# Patient Record
Sex: Female | Born: 1955
Health system: Southern US, Community
[De-identification: ages and names within clinical notes are randomized; demographics above are authoritative.]

## PROBLEM LIST (undated history)

## (undated) DIAGNOSIS — K219 Gastro-esophageal reflux disease without esophagitis: Secondary | ICD-10-CM

## (undated) DIAGNOSIS — G8929 Other chronic pain: Secondary | ICD-10-CM

## (undated) DIAGNOSIS — F329 Major depressive disorder, single episode, unspecified: Secondary | ICD-10-CM

## (undated) DIAGNOSIS — R519 Headache, unspecified: Secondary | ICD-10-CM

## (undated) DIAGNOSIS — R002 Palpitations: Secondary | ICD-10-CM

## (undated) DIAGNOSIS — E785 Hyperlipidemia, unspecified: Secondary | ICD-10-CM

## (undated) DIAGNOSIS — M1990 Unspecified osteoarthritis, unspecified site: Secondary | ICD-10-CM

## (undated) DIAGNOSIS — E0590 Thyrotoxicosis, unspecified without thyrotoxic crisis or storm: Secondary | ICD-10-CM

## (undated) DIAGNOSIS — I10 Essential (primary) hypertension: Secondary | ICD-10-CM

## (undated) DIAGNOSIS — F419 Anxiety disorder, unspecified: Secondary | ICD-10-CM

## (undated) DIAGNOSIS — M549 Dorsalgia, unspecified: Secondary | ICD-10-CM

## (undated) DIAGNOSIS — F32A Depression, unspecified: Secondary | ICD-10-CM

## (undated) DIAGNOSIS — G4701 Insomnia due to medical condition: Secondary | ICD-10-CM

## (undated) DIAGNOSIS — R51 Headache: Secondary | ICD-10-CM

## (undated) DIAGNOSIS — H269 Unspecified cataract: Secondary | ICD-10-CM

## (undated) DIAGNOSIS — K5900 Constipation, unspecified: Secondary | ICD-10-CM

## (undated) DIAGNOSIS — A150 Tuberculosis of lung: Secondary | ICD-10-CM

## (undated) HISTORY — DX: Tuberculosis of lung: A15.0

## (undated) HISTORY — PX: TRANSTHORACIC ECHOCARDIOGRAM: SHX275

## (undated) HISTORY — PX: VAGINAL HYSTERECTOMY: SUR661

## (undated) HISTORY — DX: Depression, unspecified: F32.A

## (undated) HISTORY — PX: CHOLECYSTECTOMY, LAPAROSCOPIC: SHX56

## (undated) HISTORY — DX: Constipation, unspecified: K59.00

## (undated) HISTORY — DX: Major depressive disorder, single episode, unspecified: F32.9

## (undated) HISTORY — PX: BLADDER REPAIR: SHX76

## (undated) HISTORY — DX: Palpitations: R00.2

---

## 1999-06-29 ENCOUNTER — Encounter: Payer: Self-pay | Admitting: Emergency Medicine

## 1999-06-29 ENCOUNTER — Encounter: Admission: RE | Admit: 1999-06-29 | Discharge: 1999-06-29 | Payer: Self-pay | Admitting: Emergency Medicine

## 2000-01-20 ENCOUNTER — Encounter: Payer: Self-pay | Admitting: Emergency Medicine

## 2000-01-20 ENCOUNTER — Inpatient Hospital Stay (HOSPITAL_COMMUNITY): Admission: EM | Admit: 2000-01-20 | Discharge: 2000-01-22 | Payer: Self-pay

## 2004-05-25 ENCOUNTER — Ambulatory Visit: Payer: Self-pay | Admitting: Internal Medicine

## 2004-05-29 ENCOUNTER — Ambulatory Visit: Payer: Self-pay | Admitting: Family Medicine

## 2004-06-06 ENCOUNTER — Ambulatory Visit: Payer: Self-pay | Admitting: Family Medicine

## 2004-06-06 ENCOUNTER — Ambulatory Visit: Payer: Self-pay | Admitting: *Deleted

## 2004-08-17 ENCOUNTER — Ambulatory Visit: Payer: Self-pay | Admitting: Family Medicine

## 2004-10-24 ENCOUNTER — Ambulatory Visit: Payer: Self-pay | Admitting: Nurse Practitioner

## 2004-11-17 ENCOUNTER — Ambulatory Visit: Payer: Self-pay | Admitting: Family Medicine

## 2005-02-23 ENCOUNTER — Ambulatory Visit: Payer: Self-pay | Admitting: Family Medicine

## 2005-12-18 ENCOUNTER — Ambulatory Visit: Payer: Self-pay | Admitting: Family Medicine

## 2005-12-28 ENCOUNTER — Ambulatory Visit: Payer: Self-pay | Admitting: Family Medicine

## 2006-03-21 ENCOUNTER — Ambulatory Visit: Payer: Self-pay | Admitting: Internal Medicine

## 2006-03-27 ENCOUNTER — Ambulatory Visit: Payer: Self-pay | Admitting: Family Medicine

## 2006-03-30 ENCOUNTER — Emergency Department (HOSPITAL_COMMUNITY): Admission: EM | Admit: 2006-03-30 | Discharge: 2006-03-30 | Payer: Self-pay | Admitting: Emergency Medicine

## 2006-04-09 ENCOUNTER — Ambulatory Visit: Payer: Self-pay | Admitting: Family Medicine

## 2006-04-10 ENCOUNTER — Ambulatory Visit (HOSPITAL_COMMUNITY): Admission: RE | Admit: 2006-04-10 | Discharge: 2006-04-10 | Payer: Self-pay | Admitting: Family Medicine

## 2006-04-18 ENCOUNTER — Ambulatory Visit: Payer: Self-pay | Admitting: Family Medicine

## 2006-06-17 ENCOUNTER — Ambulatory Visit: Payer: Self-pay | Admitting: Family Medicine

## 2006-11-05 ENCOUNTER — Ambulatory Visit: Payer: Self-pay | Admitting: Family Medicine

## 2006-11-06 ENCOUNTER — Encounter (INDEPENDENT_AMBULATORY_CARE_PROVIDER_SITE_OTHER): Payer: Self-pay | Admitting: Family Medicine

## 2006-11-06 ENCOUNTER — Encounter (INDEPENDENT_AMBULATORY_CARE_PROVIDER_SITE_OTHER): Payer: Self-pay | Admitting: *Deleted

## 2006-11-20 ENCOUNTER — Ambulatory Visit: Payer: Self-pay | Admitting: Family Medicine

## 2007-01-06 ENCOUNTER — Ambulatory Visit: Payer: Self-pay | Admitting: Family Medicine

## 2007-01-13 ENCOUNTER — Ambulatory Visit (HOSPITAL_COMMUNITY): Admission: RE | Admit: 2007-01-13 | Discharge: 2007-01-13 | Payer: Self-pay | Admitting: Family Medicine

## 2007-04-18 ENCOUNTER — Ambulatory Visit: Payer: Self-pay | Admitting: Family Medicine

## 2007-05-12 ENCOUNTER — Ambulatory Visit: Payer: Self-pay | Admitting: Family Medicine

## 2008-04-09 ENCOUNTER — Emergency Department (HOSPITAL_COMMUNITY): Admission: EM | Admit: 2008-04-09 | Discharge: 2008-04-09 | Payer: Self-pay | Admitting: Emergency Medicine

## 2008-04-26 ENCOUNTER — Ambulatory Visit: Payer: Self-pay | Admitting: Family Medicine

## 2008-06-15 ENCOUNTER — Encounter (INDEPENDENT_AMBULATORY_CARE_PROVIDER_SITE_OTHER): Payer: Self-pay | Admitting: *Deleted

## 2008-06-15 ENCOUNTER — Ambulatory Visit: Payer: Self-pay | Admitting: Sports Medicine

## 2008-06-15 DIAGNOSIS — IMO0002 Reserved for concepts with insufficient information to code with codable children: Secondary | ICD-10-CM

## 2008-06-22 ENCOUNTER — Encounter: Payer: Self-pay | Admitting: Sports Medicine

## 2008-06-29 ENCOUNTER — Ambulatory Visit: Payer: Self-pay | Admitting: Sports Medicine

## 2008-07-06 ENCOUNTER — Telehealth (INDEPENDENT_AMBULATORY_CARE_PROVIDER_SITE_OTHER): Payer: Self-pay | Admitting: *Deleted

## 2008-08-03 ENCOUNTER — Ambulatory Visit: Payer: Self-pay | Admitting: Sports Medicine

## 2008-08-04 ENCOUNTER — Encounter: Payer: Self-pay | Admitting: Sports Medicine

## 2008-08-04 ENCOUNTER — Telehealth (INDEPENDENT_AMBULATORY_CARE_PROVIDER_SITE_OTHER): Payer: Self-pay | Admitting: *Deleted

## 2008-08-26 ENCOUNTER — Telehealth (INDEPENDENT_AMBULATORY_CARE_PROVIDER_SITE_OTHER): Payer: Self-pay | Admitting: *Deleted

## 2008-09-01 ENCOUNTER — Ambulatory Visit: Payer: Self-pay | Admitting: Sports Medicine

## 2008-09-29 ENCOUNTER — Ambulatory Visit: Payer: Self-pay | Admitting: Sports Medicine

## 2008-10-04 ENCOUNTER — Telehealth (INDEPENDENT_AMBULATORY_CARE_PROVIDER_SITE_OTHER): Payer: Self-pay | Admitting: *Deleted

## 2008-10-05 ENCOUNTER — Ambulatory Visit (HOSPITAL_COMMUNITY): Admission: RE | Admit: 2008-10-05 | Discharge: 2008-10-05 | Payer: Self-pay | Admitting: Sports Medicine

## 2008-10-12 ENCOUNTER — Encounter: Payer: Self-pay | Admitting: Sports Medicine

## 2008-10-19 ENCOUNTER — Encounter: Payer: Self-pay | Admitting: Sports Medicine

## 2008-10-28 ENCOUNTER — Encounter: Payer: Self-pay | Admitting: Sports Medicine

## 2008-11-08 ENCOUNTER — Encounter: Payer: Self-pay | Admitting: Sports Medicine

## 2008-12-06 ENCOUNTER — Encounter: Payer: Self-pay | Admitting: Sports Medicine

## 2009-03-22 ENCOUNTER — Ambulatory Visit: Payer: Self-pay | Admitting: Sports Medicine

## 2009-03-22 DIAGNOSIS — M545 Low back pain: Secondary | ICD-10-CM

## 2009-03-23 ENCOUNTER — Encounter: Payer: Self-pay | Admitting: Sports Medicine

## 2009-03-29 ENCOUNTER — Encounter: Payer: Self-pay | Admitting: Sports Medicine

## 2009-04-18 ENCOUNTER — Encounter: Admission: RE | Admit: 2009-04-18 | Discharge: 2009-06-01 | Payer: Self-pay | Admitting: Neurosurgery

## 2010-03-12 ENCOUNTER — Encounter: Payer: Self-pay | Admitting: Nurse Practitioner

## 2010-03-12 ENCOUNTER — Encounter: Payer: Self-pay | Admitting: Family Medicine

## 2010-03-21 NOTE — Letter (Signed)
Summary: External Other  External Other   Imported By: Marily Memos 03/22/2009 14:23:45  _____________________________________________________________________  External Attachment:    Type:   Image     Comment:   External Document

## 2010-03-21 NOTE — Progress Notes (Signed)
Summary: Clinical Lists Update  Clinical Lists Update   Imported By: Marily Memos 03/23/2009 14:13:20  _____________________________________________________________________  External Attachment:    Type:   Image     Comment:   External Document

## 2010-03-21 NOTE — Consult Note (Signed)
Summary: [facility]  [facility]   Imported By: Marily Memos 04/07/2009 15:08:56  _____________________________________________________________________  External Attachment:    Type:   Image     Comment:   External Document

## 2010-03-21 NOTE — Letter (Signed)
Summary: *Referral Letter  [facility]  [address]   [place], [place] [address]   Phone: [phone]  Fax: [phone]    03/22/2009  [facility] [address], [address] [place], [place]  [address] Fax: [phone]   Dear Neurosurgical Consultant:  Thank you in advance for agreeing to see my patient:  Karla Foster [address] [place], [place]  [address]  Phone: [phone]  Reason for Referral: This is a patient with MRI documented Disk herniation that I think is causing radiculopathy and weakness.  She has failed more conservative treatment.  Your opinion as to whether surgical treatment is advisable.  Procedures Requested: Lumbar disk decompression if indicated  Current Medical Problems: 1)  BACK PAIN, LUMBAR, CHRONIC (ICD-724.2) 2)  LUMBAR RADICULOPATHY, LEFT (ICD-724.4)   Current Medications: 1)  GABAPENTIN 600 MG TABS (GABAPENTIN) 1 by mouth three times a day PRN 2)  OXYCODONE-ACETAMINOPHEN 10-325 MG TABS (OXYCODONE-ACETAMINOPHEN) 1 by mouth up to 4 times per day as needed for pain   Thank you again for agreeing to see our patient; please contact us if you have any further questions or need additional information.  Sincerely,  Vincent Gros MD

## 2010-03-21 NOTE — Assessment & Plan Note (Signed)
Summary: F/U [facility]   Vital Signs:  Patient profile:   55 year old female BP sitting:   142 / 82  Vitals Entered By: Lillia Pauls CMA (March 22, 2009 10:37 AM)  History of Present Illness: Patient returns in followup of radicular low back pain with weakness and radiation into left leg primarily sometimes into RT leg She is able to fucntion on pain meds and gabapentin with limited activity However, has not really gotten better Has had a couple of falls now uses walker for safety left leg feels like it wants to give way  MRI showed shallow Disk at L5/S1 with possible compression of left S1 nerve root in 09/2008  She has not returned 2/2 $$ Recently goit her approval for boith medicare and medicaid and wants to try to move ahead with seeing if more aggressive care would help  Physical Exam  General:  Well-developed,well-nourished,in no acute distress; alert,appropriate and cooperative throughout examination Msk:  + slr on left + pain in left buttocks on SLR on RT  Weakness in great toe dorsi and plantar flexion on left slight weakness in dorsiflex on RT  Weakness on eversion but not inversion of foot left normal on RT  walks with limp and less pushoff on LT unable to do toe walk  Reflexes Patellar RT is 3+/ Lt is 2+ Ankle RT 2+ and left is 1+   Impression & Recommendations:  Problem # 1:  LUMBAR RADICULOPATHY, LEFT (ICD-724.4)  Her updated medication list for this problem includes:    Gabapentin 600 three times a day Use walker if gait is improved  will refer for neurosurg opinion  Problem # 2:  BACK PAIN, LUMBAR, CHRONIC (ICD-724.2)  Her updated medication list for this problem includes:    Oxycodone-acetaminophen 10-325 Mg Tabs (Oxycodone-acetaminophen) .Marland Kitchen... 1 by mouth up to 4 times per day as needed for pain  Complete Medication List: 1)  Gabapentin 600 Mg Tabs (Gabapentin) .Marland Kitchen.. 1 by mouth three times a day prn 2)  Oxycodone-acetaminophen 10-325 Mg  Tabs (Oxycodone-acetaminophen) .Marland Kitchen.. 1 by mouth up to 4 times per day as needed for pain

## 2011-06-05 ENCOUNTER — Emergency Department (HOSPITAL_COMMUNITY): Payer: Medicare Other

## 2011-06-05 ENCOUNTER — Encounter (HOSPITAL_COMMUNITY): Payer: Self-pay

## 2011-06-05 ENCOUNTER — Observation Stay (HOSPITAL_COMMUNITY)
Admission: EM | Admit: 2011-06-05 | Discharge: 2011-06-06 | Disposition: A | Discharge status: Home / Self Care | Payer: Medicare Other | Attending: Emergency Medicine | Admitting: Emergency Medicine

## 2011-06-05 DIAGNOSIS — E785 Hyperlipidemia, unspecified: Secondary | ICD-10-CM | POA: Diagnosis present

## 2011-06-05 DIAGNOSIS — M545 Low back pain, unspecified: Secondary | ICD-10-CM | POA: Diagnosis present

## 2011-06-05 DIAGNOSIS — F17200 Nicotine dependence, unspecified, uncomplicated: Secondary | ICD-10-CM | POA: Diagnosis present

## 2011-06-05 DIAGNOSIS — F419 Anxiety disorder, unspecified: Secondary | ICD-10-CM | POA: Diagnosis present

## 2011-06-05 DIAGNOSIS — R9431 Abnormal electrocardiogram [ECG] [EKG]: Secondary | ICD-10-CM | POA: Diagnosis present

## 2011-06-05 DIAGNOSIS — R079 Chest pain, unspecified: Principal | ICD-10-CM | POA: Insufficient documentation

## 2011-06-05 DIAGNOSIS — I200 Unstable angina: Secondary | ICD-10-CM | POA: Diagnosis present

## 2011-06-05 DIAGNOSIS — R002 Palpitations: Secondary | ICD-10-CM | POA: Diagnosis not present

## 2011-06-05 DIAGNOSIS — M549 Dorsalgia, unspecified: Secondary | ICD-10-CM | POA: Insufficient documentation

## 2011-06-05 DIAGNOSIS — G8929 Other chronic pain: Secondary | ICD-10-CM | POA: Insufficient documentation

## 2011-06-05 DIAGNOSIS — R55 Syncope and collapse: Secondary | ICD-10-CM | POA: Insufficient documentation

## 2011-06-05 DIAGNOSIS — G43909 Migraine, unspecified, not intractable, without status migrainosus: Secondary | ICD-10-CM | POA: Diagnosis present

## 2011-06-05 DIAGNOSIS — G4700 Insomnia, unspecified: Secondary | ICD-10-CM | POA: Insufficient documentation

## 2011-06-05 DIAGNOSIS — F411 Generalized anxiety disorder: Secondary | ICD-10-CM | POA: Insufficient documentation

## 2011-06-05 DIAGNOSIS — Z8249 Family history of ischemic heart disease and other diseases of the circulatory system: Secondary | ICD-10-CM

## 2011-06-05 HISTORY — DX: Thyrotoxicosis, unspecified without thyrotoxic crisis or storm: E05.90

## 2011-06-05 HISTORY — DX: Other chronic pain: G89.29

## 2011-06-05 HISTORY — DX: Hyperlipidemia, unspecified: E78.5

## 2011-06-05 HISTORY — DX: Anxiety disorder, unspecified: F41.9

## 2011-06-05 HISTORY — DX: Dorsalgia, unspecified: M54.9

## 2011-06-05 HISTORY — DX: Insomnia due to medical condition: G47.01

## 2011-06-05 LAB — BASIC METABOLIC PANEL
CO2: 25 mEq/L (ref 19–32)
Calcium: 9.1 mg/dL (ref 8.4–10.5)
Chloride: 106 mEq/L (ref 96–112)
Glucose, Bld: 88 mg/dL (ref 70–99)
Sodium: 141 mEq/L (ref 135–145)

## 2011-06-05 LAB — POCT I-STAT TROPONIN I: Troponin i, poc: 0.01 ng/mL (ref 0.00–0.08)

## 2011-06-05 LAB — CBC
HCT: 39.6 % (ref 36.0–46.0)
MCV: 89.2 fL (ref 78.0–100.0)
Platelets: 314 10*3/uL (ref 150–400)
WBC: 13.1 10*3/uL — ABNORMAL HIGH (ref 4.0–10.5)

## 2011-06-05 LAB — D-DIMER, QUANTITATIVE: D-Dimer, Quant: <0.22 ug/mL-FEU (ref 0.00–0.48)

## 2011-06-05 MED ORDER — ONDANSETRON HCL 4 MG/2ML IJ SOLN
4.0000 mg | Freq: Once | INTRAMUSCULAR | Status: AC
  Administered 2011-06-05: 4 mg via INTRAVENOUS
  Filled 2011-06-05: qty 2

## 2011-06-05 MED ORDER — MORPHINE SULFATE 4 MG/ML IJ SOLN
4.0000 mg | Freq: Once | INTRAMUSCULAR | Status: AC
  Administered 2011-06-05: 4 mg via INTRAVENOUS
  Filled 2011-06-05: qty 1

## 2011-06-05 MED ORDER — ASPIRIN 81 MG PO CHEW: 324.0000 mg | CHEWABLE_TABLET | Freq: Once | ORAL | Status: DC

## 2011-06-05 MED ORDER — ONDANSETRON HCL 4 MG/2ML IJ SOLN
INTRAMUSCULAR | Status: AC
  Filled 2011-06-05: qty 2

## 2011-06-05 MED ORDER — SODIUM CHLORIDE 0.9 % IV SOLN
Freq: Once | INTRAVENOUS | Status: AC
  Administered 2011-06-05: 18:00:00 via INTRAVENOUS

## 2011-06-05 MED ORDER — METOPROLOL TARTRATE 25 MG PO TABS
100.0000 mg | ORAL_TABLET | Freq: Once | ORAL | Status: DC
  Filled 2011-06-05: qty 4

## 2011-06-05 NOTE — ED Notes (Signed)
Per EMS- patient reporting palpitations x several weeks; today, patient reporting central chest pain 8/10 without radiation with nausea. 1 NTG, 4mg  of zofran, 324 ASA given all PTA.  Patient was picked up at her PCP's office and had a syncopal episode while at the office.

## 2011-06-05 NOTE — ED Provider Notes (Signed)
8:00 PM Care of the patient assumed in the CDU under the chest pain protocol. Patient presented to the ED with palpitations for the past several weeks. She had an episode today of central chest pain which did not radiate and was not accompanied by nausea, vomiting, diaphoresis, shortness of breath. She did have a syncopal episode while at her PCPs office today. Patient's pain is reproducible with palpation, and her vital signs are stable. Her lab workup has essentially been negative so far. Her EKG was unremarkable. Initial troponin was 0.01. Patient to the CDU awaiting CCTA in the morning. On exam, patient noted to be relatively anxious. Her pain is very reproducible to palpation. Regular rate and rhythm, lungs clear to auscultation bilaterally, abdomen soft, nontender. Discussed plan with patient. She verbalized understanding and was agreeable.  12:17 AM Patient reassessed. She remains comfortable and pain-free. Exam unchanged from previous. Pt signed out to Dr. Dierdre Highman, blue side overnight MD.  Grant Fontana, PA 06/06/11 [phone]

## 2011-06-05 NOTE — ED Provider Notes (Signed)
History     CSN: [id number]  Arrival date & time 06/05/11  1623   First MD Initiated Contact with Patient 06/05/11 1634      Chief Complaint  Patient presents with  . Chest Pain    (Consider location/radiation/quality/duration/timing/severity/associated sxs/prior treatment) HPI Pt presents with c/o palpitations and chest pain.  Pt states she has been having intermittent racing heart rate and palpitations over the past several weeks.  Today she reports onset of 8/10 midsternal chest pain with associated nausea.  She denies diaphoresis, or radiation of pain.  No leg swelling, no hx DVT/PE, no recent travel.  She does have a hx of anxiety and states that she has been under a lot of stress and seeing a psychologist.  She states she feels fatigued.  No fever, cough or other associated systemic symptoms.  There are no alleviating or modifying factors, pain not related to exertion.   Past Medical History  Diagnosis Date  . Anxiety   . 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 Chronic back pain   . Dyslipidemia     not Rx'd  . Hyperthyroidism     treated in past    Past Surgical History  Procedure Date  . Cholecystectomy, laparoscopic   . Vaginal hysterectomy   . Bladder repair     after hysterectomy    Family History  Problem Relation Age of Onset  . Coronary artery disease Father     CABG 71's, died 80 y/o  . Coronary artery disease Brother     died 49y/o MI    History  Substance Use Topics  . Smoking status: Current Everyday Smoker    Types: Cigarettes  . Smokeless tobacco: Not on file   Comment: less than a pack a day  . Alcohol Use: No    OB History    Grav Para Term Preterm Abortions TAB SAB Ect Mult Living                  Review of Systems ROS reviewed and all otherwise negative except for mentioned in HPI  Allergies  Review of patient's allergies indicates no known allergies.  Home Medications   Current Outpatient Rx    Name Route Sig Dispense Refill  . CLONAZEPAM 1 MG PO TABS Oral Take 2 mg by mouth at bedtime.     . DULOXETINE HCL 60 MG PO CPEP Oral Take 60 mg by mouth at bedtime.     . TRAZODONE HCL 300 MG PO TABS Oral Take 300 mg by mouth at bedtime.      BP 90/60  Pulse 62  Temp(Src) 97.7 F (36.5 C) (Oral)  Resp 19  SpO2 96% Vitals reviewed Physical Exam Physical Examination: General appearance - alert, concerned and uncomfortable appearing, and in no distress Mental status - alert, oriented to person, place, and time Eyes - pupils equal and reactive, no scleral icterus Mouth - mucous membranes moist, pharynx normal without lesions Chest - clear to auscultation, no wheezes, rales or rhonchi, symmetric air entry, tender to palpation in anterior chest wall Heart - normal rate, regular rhythm, normal S1, S2, no murmurs, rubs, clicks or gallops Abdomen - soft, nontender, nondistended, no masses or organomegaly Musculoskeletal - no joint tenderness, deformity or swelling, FROM Extremities - peripheral pulses normal, no pedal edema, no clubbing or cyanosis Skin - normal coloration and turgor, no rashes Psych-anxious  ED Course  Procedures (including critical care time)   Date: 06/05/2011  Rate: 87  Rhythm: normal sinus rhythm  QRS Axis: normal  Intervals: normal  ST/T Wave abnormalities: normal  Conduction Disutrbances:none  Narrative Interpretation:   Old EKG Reviewed: none available    Labs Reviewed  CBC - Abnormal; Notable for the following:    WBC 13.1 (*)    All other components within normal limits  LIPID PANEL - Abnormal; Notable for the following:    Cholesterol 207 (*)    LDL Cholesterol 122 (*)    All other components within normal limits  BASIC METABOLIC PANEL  PRO B NATRIURETIC PEPTIDE  D-DIMER, QUANTITATIVE  TROPONIN I  TROPONIN I  POCT I-STAT TROPONIN I  POCT I-STAT TROPONIN I  TSH  PROTIME-INR  LAB REPORT - SCANNED   No results found. 7:47 PM Pt to be  placed on chest pain protocol- I have discussed her with Grant Fontana, PA and have updated the patient about our findings thus far and the plan.  She is requesting more pain medication which I have ordered.   1. Chest pain       MDM  Pt presenting with chest pain.  Pt is low risk for ACS, EKG and troponin normal, I suspect this may be related to some anxiety or chest wall pain, however pt placed on CDU chest pain protocol- plan is for serial enzymes and CT coronary angio tomorrow morning.          Ethelda Chick, MD 06/08/11 [phone]

## 2011-06-06 ENCOUNTER — Observation Stay (HOSPITAL_COMMUNITY): Payer: Medicare Other

## 2011-06-06 ENCOUNTER — Encounter (HOSPITAL_COMMUNITY): Payer: Self-pay | Admitting: Cardiology

## 2011-06-06 ENCOUNTER — Other Ambulatory Visit (HOSPITAL_COMMUNITY): Payer: Medicare Other

## 2011-06-06 DIAGNOSIS — R9431 Abnormal electrocardiogram [ECG] [EKG]: Secondary | ICD-10-CM | POA: Diagnosis present

## 2011-06-06 DIAGNOSIS — E785 Hyperlipidemia, unspecified: Secondary | ICD-10-CM | POA: Diagnosis present

## 2011-06-06 DIAGNOSIS — I200 Unstable angina: Secondary | ICD-10-CM | POA: Diagnosis present

## 2011-06-06 DIAGNOSIS — G43909 Migraine, unspecified, not intractable, without status migrainosus: Secondary | ICD-10-CM | POA: Diagnosis present

## 2011-06-06 DIAGNOSIS — F419 Anxiety disorder, unspecified: Secondary | ICD-10-CM | POA: Diagnosis present

## 2011-06-06 DIAGNOSIS — R002 Palpitations: Secondary | ICD-10-CM | POA: Diagnosis not present

## 2011-06-06 DIAGNOSIS — Z8249 Family history of ischemic heart disease and other diseases of the circulatory system: Secondary | ICD-10-CM

## 2011-06-06 DIAGNOSIS — F17200 Nicotine dependence, unspecified, uncomplicated: Secondary | ICD-10-CM | POA: Diagnosis present

## 2011-06-06 LAB — LIPID PANEL
Cholesterol: 207 mg/dL — ABNORMAL HIGH (ref 0–200)
HDL: 63 mg/dL (ref >39)
LDL Cholesterol: 122 mg/dL — ABNORMAL HIGH (ref 0–99)
Total CHOL/HDL Ratio: 3.3 RATIO
Triglycerides: 111 mg/dL (ref <150)
VLDL: 22 mg/dL (ref 0–40)

## 2011-06-06 LAB — PROTIME-INR
INR: 0.92 (ref 0.00–1.49)
Prothrombin Time: 12.6 seconds (ref 11.6–15.2)

## 2011-06-06 LAB — TSH: TSH: 4.443 u[IU]/mL (ref 0.350–4.500)

## 2011-06-06 MED ORDER — NITROGLYCERIN 0.4 MG SL SUBL
SUBLINGUAL_TABLET | SUBLINGUAL | Status: AC
  Administered 2011-06-06: 0.4 mg via SUBLINGUAL
  Filled 2011-06-06: qty 25

## 2011-06-06 MED ORDER — METOPROLOL TARTRATE 25 MG PO TABS
50.0000 mg | ORAL_TABLET | Freq: Once | ORAL | Status: AC
  Administered 2011-06-06: 50 mg via ORAL

## 2011-06-06 MED ORDER — NITROGLYCERIN 0.4 MG SL SUBL
0.4000 mg | SUBLINGUAL_TABLET | Freq: Once | SUBLINGUAL | Status: AC
  Administered 2011-06-06: 0.4 mg via SUBLINGUAL

## 2011-06-06 MED ORDER — SODIUM CHLORIDE 0.9 % IV BOLUS (SEPSIS)
1000.0000 mL | Freq: Once | INTRAVENOUS | Status: AC
  Administered 2011-06-06: 1000 mL via INTRAVENOUS

## 2011-06-06 MED ORDER — IOHEXOL 350 MG/ML SOLN
80.0000 mL | Freq: Once | INTRAVENOUS | Status: AC | PRN
  Administered 2011-06-06: 80 mL via INTRAVENOUS

## 2011-06-06 NOTE — Progress Notes (Signed)
Reviewed CTA, the calcium score is zero. Quality was excellent due to low heart rate. Coronaries are widely patent. She has a ramus intermedius vessel. Right coronary artery is dominant. Low risk of cardiac events. Suspect she may have POTS syndrome or chest pain related to migraines. Will arrange outpatient monitor and bubble echo as well as follow-up with me in the office. Ok from our standpoint to d/c home. No b-blocker.  Chrystie Nose, MD, [facility] Attending Cardiologist [facility]

## 2011-06-06 NOTE — ED Provider Notes (Signed)
Patient resting comfortably overnight in the ED pain free. Workup as below. Repeat EKG demonstrates T wave inversions V1 and V2 or change from her admission EKG. Plan cardiology consult for recommendations and evaluation.  Results for orders placed during the hospital encounter of 06/05/11  CBC      Component Value Range   WBC 13.1 (*) 4.0 - 10.5 (K/uL)   RBC 4.44  3.87 - 5.11 (MIL/uL)   Hemoglobin 13.6  12.0 - 15.0 (g/dL)   HCT 66.4  40.3 - 47.4 (%)   MCV 89.2  78.0 - 100.0 (fL)   MCH 30.6  26.0 - 34.0 (pg)   MCHC 34.3  30.0 - 36.0 (g/dL)   RDW 25.9  56.3 - 87.5 (%)   Platelets 314  150 - 400 (K/uL)  BASIC METABOLIC PANEL      Component Value Range   Sodium 141  135 - 145 (mEq/L)   Potassium 3.6  3.5 - 5.1 (mEq/L)   Chloride 106  96 - 112 (mEq/L)   CO2 25  19 - 32 (mEq/L)   Glucose, Bld 88  70 - 99 (mg/dL)   BUN 11  6 - 23 (mg/dL)   Creatinine, Ser 6.43  0.50 - 1.10 (mg/dL)   Calcium 9.1  8.4 - 32.9 (mg/dL)   GFR calc non Af Amer >90  >90 (mL/min)   GFR calc Af Amer >90  >90 (mL/min)  PRO B NATRIURETIC PEPTIDE      Component Value Range   Pro B Natriuretic peptide (BNP) 71.4  0 - 125 (pg/mL)  D-DIMER, QUANTITATIVE      Component Value Range   D-Dimer, Quant <0.22  0.00 - 0.48 (ug/mL-FEU)  TROPONIN I      Component Value Range   Troponin I <0.30  <0.30 (ng/mL)  TROPONIN I      Component Value Range   Troponin I <0.30  <0.30 (ng/mL)  POCT I-STAT TROPONIN I      Component Value Range   Troponin i, poc 0.01  0.00 - 0.08 (ng/mL)   Comment 3           POCT I-STAT TROPONIN I      Component Value Range   Troponin i, poc 0.00  0.00 - 0.08 (ng/mL)   Comment 3            Dg Chest 2 View  06/05/2011  *RADIOLOGY REPORT*  Clinical Data: Chest pain, syncope.  CHEST - 2 VIEW  Comparison: None.  Findings: Lungs are clear.  Heart size is normal.  No pneumothorax or pleural effusion.  IMPRESSION: No acute disease.  Original Report Authenticated By: Bernadene Bell. Maricela Curet, M.D.       Sunnie Nielsen, MD 06/06/11 [phone]

## 2011-06-06 NOTE — ED Notes (Signed)
Dizziness remains. Pt given apple juice and is receiving iv fluid bolus.

## 2011-06-06 NOTE — H&P (Signed)
Patient ID: Karla Foster MRN: [id number], DOB/AGE: November 27, 1955   Admit date: 06/05/2011   Primary Physician: Leo Grosser, MD, MD Primary Cardiologist: Dr Rennis Golden (new)  HPI: 56 y/o female with no prior cardiac history, presents to the ER from primary MD office 4/16 with chest pain. The pt says she has been having palpitations for 2 weeks. She describes tachycardia that lasts up to 3 minutes. This followed by a "migrain". She went to Dr Karla Foster office yesterday with this complaint, then had chest pain in his office. She says it was "severe" pain that went from back to front. No radiation to her arms or jaw but some associated diaphoresis and near syncope. She was sent to [facility] ER. Troponin negative but she developed TWI V2, and AVL overnight.  She has had no arrythmia and is in fact a little bradycardic this am but pain free.   Problem List: Past Medical History  Diagnosis Date  . Anxiety   . 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 Chronic back pain   . Dyslipidemia     not Rx'd    Past Surgical History  Procedure Date  . Cholecystectomy, laparoscopic   . Vaginal hysterectomy   . Bladder repair     after hysterectomy     Allergies: No Known Allergies   Home Medications  (Not in a hospital admission)   Family History  Problem Relation Age of Onset  . Coronary artery disease Father     CABG 11's, died 53 y/o  . Coronary artery disease Brother     died 49y/o MI     History   Social History  . Marital Status: Divorced    Spouse Name: [name]    Number of Children: [name]  . Years of Education: [name]   Occupational History  . disabled secondary to back pain    Social History Main Topics  . Smoking status: Current Everyday Smoker    Types: Cigarettes  . Smokeless tobacco: Not on file   Comment: less than a pack a day  . Alcohol Use: No  . Drug Use: No  . Sexually Active: Not on file   Other Topics Concern  . Not on file   Social  History Narrative  . No narrative on file     Review of Systems: General: negative for chills, fever, night sweats or weight changes.  Cardiovascular: negative for chest pain, dyspnea on exertion, edema, orthopnea, palpitations, paroxysmal nocturnal dyspnea or shortness of breath Dermatological: negative for rash Respiratory: negative for cough or wheezing Urologic: negative for hematuria Abdominal: negative for nausea, vomiting, diarrhea, bright red blood per rectum, melena, or hematemesis Neurologic: negative for visual changes, syncope, or dizziness All other systems reviewed and are otherwise negative except as noted above.  Physical Exam: Blood pressure 103/57, pulse 52, temperature 98 F (36.7 C), temperature source Oral, resp. rate 19, SpO2 97.00%.  General appearance: alert, cooperative, no distress and mildly obese Neck: no carotid bruit, no JVD, supple, symmetrical, trachea midline and thyroid not enlarged, symmetric, no tenderness/mass/nodules Lungs: clear to auscultation bilaterally Heart: regular rate and rhythm Abdomen: soft, non-tender; bowel sounds normal; no masses,  no organomegaly Extremities: extremities normal, atraumatic, no cyanosis or edema Pulses: 2+ and symmetric Skin: Skin color, texture, turgor normal. No rashes or lesions Neurologic: Grossly normal    Labs:   Results for orders placed during the hospital encounter of 06/05/11 (from the past 24 hour(s))  CBC  Status: Abnormal   Collection Time   06/05/11  4:56 PM      Component Value Range   WBC 13.1 (*) 4.0 - 10.5 (K/uL)   RBC 4.44  3.87 - 5.11 (MIL/uL)   Hemoglobin 13.6  12.0 - 15.0 (g/dL)   HCT 16.1  09.6 - 04.5 (%)   MCV 89.2  78.0 - 100.0 (fL)   MCH 30.6  26.0 - 34.0 (pg)   MCHC 34.3  30.0 - 36.0 (g/dL)   RDW 40.9  81.1 - 91.4 (%)   Platelets 314  150 - 400 (K/uL)  BASIC METABOLIC PANEL     Status: Normal   Collection Time   06/05/11  4:56 PM      Component Value Range   Sodium 141   135 - 145 (mEq/L)   Potassium 3.6  3.5 - 5.1 (mEq/L)   Chloride 106  96 - 112 (mEq/L)   CO2 25  19 - 32 (mEq/L)   Glucose, Bld 88  70 - 99 (mg/dL)   BUN 11  6 - 23 (mg/dL)   Creatinine, Ser 7.82  0.50 - 1.10 (mg/dL)   Calcium 9.1  8.4 - 95.6 (mg/dL)   GFR calc non Af Amer >90  >90 (mL/min)   GFR calc Af Amer >90  >90 (mL/min)  D-DIMER, QUANTITATIVE     Status: Normal   Collection Time   06/05/11  4:56 PM      Component Value Range   D-Dimer, Quant <0.22  0.00 - 0.48 (ug/mL-FEU)  PRO B NATRIURETIC PEPTIDE     Status: Normal   Collection Time   06/05/11  5:00 PM      Component Value Range   Pro B Natriuretic peptide (BNP) 71.4  0 - 125 (pg/mL)  POCT I-STAT TROPONIN I     Status: Normal   Collection Time   06/05/11  7:31 PM      Component Value Range   Troponin i, poc 0.01  0.00 - 0.08 (ng/mL)   Comment 3           POCT I-STAT TROPONIN I     Status: Normal   Collection Time   06/05/11 11:03 PM      Component Value Range   Troponin i, poc 0.00  0.00 - 0.08 (ng/mL)   Comment 3           TROPONIN I     Status: Normal   Collection Time   06/05/11 11:08 PM      Component Value Range   Troponin I <0.30  <0.30 (ng/mL)  TROPONIN I     Status: Normal   Collection Time   06/06/11  1:10 AM      Component Value Range   Troponin I <0.30  <0.30 (ng/mL)     Radiology/Studies: Dg Chest 2 View  06/05/2011  *RADIOLOGY REPORT*  Clinical Data: Chest pain, syncope.  CHEST - 2 VIEW  Comparison: None.  Findings: Lungs are clear.  Heart size is normal.  No pneumothorax or pleural effusion.  IMPRESSION: No acute disease.  Original Report Authenticated By: Bernadene Bell. D'ALESSIO, M.D.    EKG:NSR TWI AVL and V2  ASSESSMENT AND PLAN:   Principal Problem:  *Unstable angina  Active Problems:  Abnormal EKG - TWI AVL and V2 after admission  Family history of coronary artery disease, F-CABG 67's, brother died of MI 78  Palpitations, X 2wks  BACK PAIN, LUMBAR, CHRONIC  Dyslipidemia, she does not take  meds  Anxiety  Smoker  Plan-MD to see, ? Cath, ? Coronary CT. She will need Holter and 2D with contrast as an OP.  Deland Pretty, PA-C 06/06/2011, 8:19 AM

## 2011-06-06 NOTE — ED Notes (Signed)
Pt transported for ct scan

## 2011-06-06 NOTE — ED Notes (Signed)
Mid level at bedside

## 2011-06-06 NOTE — ED Notes (Signed)
Returned from coronary scan. Of note pt becomes dizzy when standing up and moving. States similar feeling yesterday at doctors office.

## 2011-06-06 NOTE — Progress Notes (Signed)
Observation review is complete. 

## 2011-06-06 NOTE — ED Notes (Signed)
Cardiology at bedside.

## 2011-06-06 NOTE — ED Provider Notes (Signed)
7:20 AM Assumed care of patient in the CDU.  Patient is currently on Chest Pain Protocol.  Discussed with Dr. Dierdre Highman.  Patient apparently had some EKG changes overnight.  T wave inversion of V2 that was not present initially.  Dr. Dierdre Highman recommends calling Cardiology to evaluate patient and look at the EKG.  Reassessed patient.  She denies any chest pain at this time.  VSS.   Alert and Orientated x 3, Heart RRR, Lungs CTAB.     7:34 AM Discussed patient and EKG changes with [facility].  They report that they will come down to the CDU and see the patient and look at the EKG.  10:45 AM Discussed with Dr. Marshell Levan with [facility].  Patient's CT coronary showed a calcium score of zero.  No evidence of coronary artery disease.  He suggested giving the patient IVF and then discharging her home and having her follow up with [facility] outpatient for a Holter Monitor and an Echocardiogram.  He reports that will set patient up for the appropriate tests.  Discussed results of the testing and plan with patient.  Patient verbalizes understanding and is in agreement with the plan.  Patient reports that her symptoms have improved at this time.  She is not having any chest pain at this time.  Pascal Lux [place], PA-C 06/06/11 1537

## 2011-06-06 NOTE — ED Provider Notes (Signed)
Medical screening examination/treatment/procedure(s) were conducted as a shared visit with non-physician practitioner(s) and myself.  I personally evaluated the patient during the encounter  I saw this patient primarily  Ethelda Chick, MD 06/06/11 [phone]

## 2011-06-06 NOTE — H&P (Signed)
Pt. Seen and examined. Agree with the NP/PA-C note as written. 56 yo female with chest pain at rest, substernal and radiated to back, associated with migraine headache that developed afterward. She has a history of bradycardia and tachyarrhythmias. Her heart was racing during the event. There were isolated "flipped" T waves that developed overnight, however, she was not having chest pain. May be related to migraines. There is a family history of CAD and she is a smoker with dyslipidemia. Will plan Cardiac CTA today. If negative, very low risk of cardiac events. May be able to be discharged home with outpatient monitor and echo with bubble study.  Will follow-up on the CT later today.  Chrystie Nose, MD, [facility] Attending Cardiologist [facility]

## 2011-06-06 NOTE — ED Notes (Signed)
Cardiology consulted and will come re-eval pt and look at ekg's performed here in dept. At present moment, pt's ct scan on hold.

## 2011-06-06 NOTE — Discharge Instructions (Signed)
Follow up with Cardiologist Dr. Rennis Golden for further cardiac testing.  Read instructions below for reasons to return to the Emergency Department. It is recommended that your follow up with your Primary Care Doctor in regards to today's visit. If you do not have a doctor, use the resource guide listed below to help you find one.  Chest Pain (Nonspecific)  HOME CARE INSTRUCTIONS  For the next few days, avoid physical activities that bring on chest pain. Continue physical activities as directed.  Do not smoke cigarettes or drink alcohol until your symptoms are gone.  Only take over-the-counter or prescription medicine for pain, discomfort, or fever as directed by your caregiver.  Follow your caregiver's suggestions for further testing if your chest pain does not go away.  Keep any follow-up appointments you made. If you do not go to an appointment, you could develop lasting (chronic) problems with pain. If there is any problem keeping an appointment, you must call to reschedule.  SEEK MEDICAL CARE IF:  You think you are having problems from the medicine you are taking. Read your medicine instructions carefully.  Your chest pain does not go away, even after treatment.  You develop a rash with blisters on your chest.  SEEK IMMEDIATE MEDICAL CARE IF:  You have increased chest pain or pain that spreads to your arm, neck, jaw, back, or belly (abdomen).  You develop shortness of breath, an increasing cough, or you are coughing up blood.  You have severe back or abdominal pain, feel sick to your stomach (nauseous) or throw up (vomit).  You develop severe weakness, fainting, or chills.  You have an oral temperature above 102 F (38.9 C), not controlled by medicine.   THIS IS AN EMERGENCY. Do not wait to see if the pain will go away. Get medical help at once. Call your local emergency services (911 in [place] Do not drive yourself to the hospital.   RESOURCE GUIDE  Dental Problems  Patients with  Medicaid: [facility] [phone]                                           [phone] [address] Phone:  [phone]                                                  Phone:  [phone]  If unable to pay or uninsured, contact:  [facility] or [facility]. to become qualified for the adult dental clinic.  Chronic Pain Problems Contact [facility]  [phone] Patients need to be referred by their primary care doctor.  Insufficient Money for Medicine Contact [employer]:  call "211" or [facility] (361)314-5440.  No Primary Care Doctor Call [facility]  513-436-3400 Other agencies that provide inexpensive medical care    [facility]  [phone]    [facility]  [phone]    [facility]  7630652189    [facility]  [phone]    [facility]  [phone]    [facility]  [phone]  Psychological Services  [facility]  [phone] [facility]  [phone] [facility]   800 853-5163 (emergency services 641-4993)  Substance Abuse Resources [facility]  336-882-2125 [facility] 336-784-9470 [facility] 336-285-9073 [facility] 336-845-3988 Residential & Outpatient Substance Abuse Program  800-659-3381  Abuse/Neglect [place] Child Abuse Hotline (336) 641-3795 [place] Child Abuse Hotline 800-378-5315 (After Hours)  Emergency Shelter [facility] (336) 271-5985  [facility] (336) 275-9566 [facility] (704) 372-4663  MRSA Hotline #:   832-7006    [place] Resources  [facility] [place]     [employer]                          [facility] [facility] [address] [place]                       [address]      [address]  [place]                                                 [place]                            [place] Phone:  [phone]                                   Phone:  [phone]                 Phone:  [phone]  [facility] Phone:  342-8316  [place] Child Abuse Hotline (336) 342-1394 (336) 342-3537 (After Hours)   

## 2011-06-07 NOTE — ED Provider Notes (Signed)
Medical screening examination/treatment/procedure(s) were performed by non-physician practitioner and as supervising physician I was immediately available for consultation/collaboration.   Joya Gaskins, MD 06/07/11 [phone]

## 2012-07-15 ENCOUNTER — Encounter: Payer: Self-pay | Admitting: Internal Medicine

## 2012-07-16 ENCOUNTER — Ambulatory Visit (INDEPENDENT_AMBULATORY_CARE_PROVIDER_SITE_OTHER): Payer: Medicare Other | Admitting: Internal Medicine

## 2012-07-16 ENCOUNTER — Encounter: Payer: Self-pay | Admitting: Internal Medicine

## 2012-07-16 VITALS — BP 110/70 | HR 75 | Ht 67.0 in | Wt 189.0 lb

## 2012-07-16 DIAGNOSIS — E785 Hyperlipidemia, unspecified: Secondary | ICD-10-CM

## 2012-07-16 DIAGNOSIS — R9431 Abnormal electrocardiogram [ECG] [EKG]: Secondary | ICD-10-CM

## 2012-07-16 DIAGNOSIS — R079 Chest pain, unspecified: Secondary | ICD-10-CM

## 2012-07-16 DIAGNOSIS — I10 Essential (primary) hypertension: Secondary | ICD-10-CM

## 2012-07-16 NOTE — Progress Notes (Signed)
OFFICE NOTE  Chief Complaint:  Routine follow-up  Primary Care Physician: Leo Grosser, MD  HPI:  Karla Foster  is a 57 year old female with significant anxiety who was recently seen in the emergency room for palpitations. She was also having chest pain and tachycardia which lasted up to about 3 minutes. This was associated with migraines. She ruled out for ischemia in the emergency room and was set up for a followup appointment in our office. She had a coronary CT scan that was done in the emergency room which showed a zero calcium score. Therefore, felt comfortable that there was no underlying cardiovascular disease. Since she was having palpitations, however, we did go ahead and check an echocardiogram and an event monitor. That event monitor demonstrated very infrequent PACs, but otherwise sinus rhythm despite the fact that she had palpitations, chest pain and other associated symptoms. Echocardiogram showed normal LV wall thickness and normal EF greater than 55% and normal diastolic function. The chamber sizes were normal. There was mild mitral annular calcification with mild mitral regurgitation. There was also mild aortic sclerosis with mild to moderate aortic insufficiency. Other than the 2 valvular abnormalities, there were no other significant findings on the echo. Overall, I do not feel that there is any cardiac cause of her symptoms. I feel that mostly this is related to anxiety and/or underlying psychiatric component. I have encouraged her to continue to get psychiatric medical care for her ongoing anxiety and/or to try to improve the stressful situations affecting her. Given the abnormal echo, it was recommended that a repeat echo be performed every 1-3 years. We can see her back in the office for that or that could be ordered from your office.   PMHx:  Past Medical History  Diagnosis Date  . Anxiety   . 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  Chronic back pain   . Dyslipidemia     not Rx'd  . Hyperthyroidism     treated in past    Past Surgical History  Procedure Laterality Date  . Cholecystectomy, laparoscopic    . Vaginal hysterectomy    . Bladder repair      after hysterectomy  . Transthoracic echocardiogram      EF> 55%; mild to moderate aortic regurgitation.    FAMHx:  Family History  Problem Relation Age of Onset  . Coronary artery disease Father     CABG 23's, died 75 y/o  . Coronary artery disease Brother     died 49y/o MI    SOCHx:   reports that she has been smoking Cigarettes.  She has been smoking about 0.00 packs per day. She does not have any smokeless tobacco history on file. She reports that she does not drink alcohol or use illicit drugs.  ALLERGIES:  No Known Allergies  ROS: A comprehensive review of systems was negative except for: Constitutional: positive for fatigue Cardiovascular: positive for chest pain Musculoskeletal: positive for myalgias  HOME MEDS: Current Outpatient Prescriptions  Medication Sig Dispense Refill  . clonazePAM (KLONOPIN) 1 MG tablet Take 2 mg by mouth at bedtime.       . DULoxetine (CYMBALTA) 60 MG capsule Take 60 mg by mouth at bedtime.       . trazodone (DESYREL) 300 MG tablet Take 300 mg by mouth at bedtime.      Marland Kitchen esomeprazole (NEXIUM) 40 MG capsule Take 40 mg by mouth daily before breakfast.  No current facility-administered medications for this visit.    LABS/IMAGING: No results found for this or any previous visit (from the past 48 hour(s)). No results found.  VITALS: BP 110/70  Pulse 75  Ht 5\' 7"  (1.702 m)  Wt 189 lb (85.73 kg)  BMI 29.59 kg/m2  EXAM: General appearance: alert and no distress Neck: no adenopathy, no carotid bruit, no JVD, supple, symmetrical, trachea midline and thyroid not enlarged, symmetric, no tenderness/mass/nodules Lungs: clear to auscultation bilaterally Heart: regular rate and rhythm, S1, S2 normal, 2/6  diastolic murmur at the LLSB, click, rub or gallop Abdomen: soft, non-tender; bowel sounds normal; no masses,  no organomegaly Extremities: extremities normal, atraumatic, no cyanosis or edema Pulses: 2+ and symmetric Skin: Skin color, texture, turgor normal. There are bandages to the right elbow and left wrist, from a recent fall with abrasions. Neurologic: Grossly normal  EKG: Normal sinus rhythm at 75  ASSESSMENT: 1. Atypical, noncardiac chest pain 2. Aortic sclerosis with mild to moderate aortic insufficiency  PLAN: 1.   Ms. Bashore has stable mild aortic sclerosis without stenosis, and mild to moderate aortic insufficiency. She continues to have complaints of chest pain which I do not believe are cardiac. I did recommend seeking psychiatric medical care however due to her insurance she reports she cannot afford that. She continues to complain of pain all over her body including in her legs I understand underwent I nerve conduction study as well his has previously seen a neurologist. If you recall her coronary CT last year was negative for calcium and showed normal coronaries. I do not believe her symptoms are cardiac in nature. She should have a repeat echocardiogram one to 3 years, which can be ordered by you throughout her office for my review. Otherwise, we can see her back as needed.  Chrystie Nose, MD, [facility] Attending Cardiologist [facility]  HILTY,Kenneth C 07/16/2012, 4:32 PM

## 2012-07-16 NOTE — Patient Instructions (Addendum)
Your physician recommends that you continue on your current medications as directed. Please refer to the Current Medication list given to you today.  No schedule appointment,call if you have any further problems

## 2012-08-27 ENCOUNTER — Encounter: Payer: Self-pay | Admitting: Family Medicine

## 2012-08-27 ENCOUNTER — Ambulatory Visit (INDEPENDENT_AMBULATORY_CARE_PROVIDER_SITE_OTHER): Payer: Medicare Other | Admitting: Family Medicine

## 2012-08-27 VITALS — BP 100/60 | HR 82 | Temp 98.1°F | Resp 20 | Ht 66.0 in | Wt 187.0 lb

## 2012-08-27 DIAGNOSIS — F329 Major depressive disorder, single episode, unspecified: Secondary | ICD-10-CM

## 2012-08-27 DIAGNOSIS — F325 Major depressive disorder, single episode, in full remission: Secondary | ICD-10-CM | POA: Insufficient documentation

## 2012-08-27 DIAGNOSIS — K5900 Constipation, unspecified: Secondary | ICD-10-CM

## 2012-08-27 DIAGNOSIS — Z9181 History of falling: Secondary | ICD-10-CM

## 2012-08-27 DIAGNOSIS — R296 Repeated falls: Secondary | ICD-10-CM

## 2012-08-27 LAB — COMPLETE METABOLIC PANEL WITH GFR
AST: 20 U/L (ref 0–37)
Alkaline Phosphatase: 124 U/L — ABNORMAL HIGH (ref 39–117)
BUN: 15 mg/dL (ref 6–23)
Calcium: 9.9 mg/dL (ref 8.4–10.5)
Chloride: 100 mEq/L (ref 96–112)
Creat: 0.92 mg/dL (ref 0.50–1.10)
GFR, Est Non African American: 69 mL/min
Glucose, Bld: 83 mg/dL (ref 70–99)

## 2012-08-27 LAB — CBC WITH DIFFERENTIAL/PLATELET
Basophils Absolute: 0.1 10*3/uL (ref 0.0–0.1)
Eosinophils Relative: 1 % (ref 0–5)
Lymphocytes Relative: 32 % (ref 12–46)
Lymphs Abs: 4 10*3/uL (ref 0.7–4.0)
MCV: 87.2 fL (ref 78.0–100.0)
Neutro Abs: 7.1 10*3/uL (ref 1.7–7.7)
Platelets: 378 10*3/uL (ref 150–400)
RBC: 4.83 MIL/uL (ref 3.87–5.11)
WBC: 12.2 10*3/uL — ABNORMAL HIGH (ref 4.0–10.5)

## 2012-08-27 LAB — TSH: TSH: 2.786 u[IU]/mL (ref 0.350–4.500)

## 2012-08-27 NOTE — Progress Notes (Signed)
Subjective:    Patient ID: Karla Foster, female    DOB: Apr 23, 1955, 57 y.o.   MRN: [id number]  HPI I last saw the patient October 2013. At that time she was reporting numerous falls every day subjective weakness in both legs distal to her hips. She also reported complete numbness in both legs. I was concerned about possible somatizations disorder due to underlying psychiatric comorbidities however I did check a TSH, CK, ESR, and CMP. However the patient never returned for the labwork.  I did obtain I nerve conduction study and electromyography of the lower limbs. It was a completely normal study. There was no electrodiagnostic evidence of large fiber neuropathy or lumbar radiculopathy at that time.  At that point I recommended physical therapy as a possible treatment for her falls due to deconditioning. The patient never followed up with physical therapy as we discussed. An MRI in 2010 revealed: IMPRESSION:  L4-5: Disc bulge. Mild facet prominence. Mild narrowing of the  lateral recesses, not grossly compressive or progressive since  01/13/2007  L5-S1: Slight enlargement of a shallow disc herniation more  prominent towards the left. This narrows the lateral recesses,  left more than right. This could possibly irritate either S1 nerve  root, more likely the left. Definite compression is not  demonstrated however. At present she denies symptoms of cauda equina sciatica. She just complains of weakness in her legs and falling.  She also was instructed shrouding her medicines for depression and anxiety. Her medication list is reviewed. She can no longer see her psychiatrist at [facility] due to financial difficulties. She states her anxiety is well controlled. She denies any problems with depression at the present time.  She is also complaining of daily chronic constipation. Past Medical History  Diagnosis Date  . 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 Chronic back  pain   . Dyslipidemia     not Rx'd  . Hyperthyroidism     treated in past  . Depression   . Anxiety    Past Surgical History  Procedure Laterality Date  . Cholecystectomy, laparoscopic    . Vaginal hysterectomy    . Bladder repair      after hysterectomy  . Transthoracic echocardiogram      EF> 55%; mild to moderate aortic regurgitation.   Current Outpatient Prescriptions on File Prior to Visit  Medication Sig Dispense Refill  . clonazePAM (KLONOPIN) 1 MG tablet Take 2 mg by mouth at bedtime.       . DULoxetine (CYMBALTA) 60 MG capsule Take 60 mg by mouth at bedtime.       . trazodone (DESYREL) 300 MG tablet Take 300 mg by mouth at bedtime.      Marland Kitchen esomeprazole (NEXIUM) 40 MG capsule Take 40 mg by mouth daily before breakfast.       No current facility-administered medications on file prior to visit.   No Known Allergies History   Social History  . Marital Status: Divorced    Spouse Name: [name]    Number of Children: [name]  . Years of Education: [name]   Occupational History  . disabled secondary to back pain    Social History Main Topics  . Smoking status: Current Every Day Smoker    Types: Cigarettes  . Smokeless tobacco: Not on file     Comment: less than a pack a day  . Alcohol Use: No  . Drug Use: No  . Sexually Active: Not on  file   Other Topics Concern  . Not on file   Social History Narrative  . No narrative on file   Family History  Problem Relation Age of Onset  . Coronary artery disease Father     CABG 38's, died 28 y/o  . Coronary artery disease Brother     died 49y/o MI     Review of Systems  All other systems reviewed and are negative.       Objective:   Physical Exam  Constitutional: She is oriented to person, place, and time.  Neck: Neck supple. No thyromegaly present.  Cardiovascular: Normal rate, regular rhythm and normal heart sounds.  Exam reveals no gallop.   No murmur heard. Pulmonary/Chest: Effort normal and breath sounds  normal. No respiratory distress. She has no wheezes.  Abdominal: Soft. Bowel sounds are normal. She exhibits no distension. There is tenderness.  Musculoskeletal: Normal range of motion. She exhibits no edema.  Lymphadenopathy:    She has no cervical adenopathy.  Neurological: She is alert and oriented to person, place, and time. She has normal reflexes. She displays normal reflexes. No cranial nerve deficit. She exhibits normal muscle tone. Coordination normal.          Assessment & Plan:  1. Depression At present I will continue Cymbalta 60 mg by mouth daily and trazodone 300 mg by mouth each bedtime. She can also use Klonopin 2 mg by mouth each bedtime for sleep.  These are the medicines her psychiatrist has been providing and I will assume her care due to her financial restriction - COMPLETE METABOLIC PANEL WITH GFR - CBC with Differential  2. Unspecified constipation Recommended increasing water intake, increasing exercise, increasing fiber intake, and daily miralax.  Check a TSH - TSH  3. Falls frequently There are no neurologic or muscular defects or deficits on physical exam. She is having normal nerve conduction study. I recommended she followup with a physical therapist for possible deconditioning. Could consider a referral to neurology or possibly an MRI of the lumbar spine is objective symptoms appear.

## 2012-08-28 ENCOUNTER — Other Ambulatory Visit: Payer: Self-pay | Admitting: Family Medicine

## 2012-08-28 DIAGNOSIS — D72829 Elevated white blood cell count, unspecified: Secondary | ICD-10-CM

## 2012-09-01 ENCOUNTER — Other Ambulatory Visit: Payer: Medicare Other

## 2012-09-01 DIAGNOSIS — D72829 Elevated white blood cell count, unspecified: Secondary | ICD-10-CM

## 2012-09-02 LAB — SEDIMENTATION RATE: Sed Rate: 8 mm/hr (ref 0–22)

## 2012-09-02 LAB — PATHOLOGIST SMEAR REVIEW

## 2012-10-21 ENCOUNTER — Other Ambulatory Visit: Payer: Self-pay | Admitting: Family Medicine

## 2012-10-21 NOTE — Telephone Encounter (Signed)
?   OK to Refill  

## 2012-10-21 NOTE — Telephone Encounter (Signed)
ok 

## 2012-10-30 ENCOUNTER — Emergency Department (HOSPITAL_COMMUNITY)
Admission: EM | Admit: 2012-10-30 | Discharge: 2012-10-30 | Discharge status: Against Medical Advice | Payer: Medicare Other | Source: Home / Self Care | Attending: Family Medicine | Admitting: Family Medicine

## 2012-10-30 ENCOUNTER — Encounter (HOSPITAL_COMMUNITY): Payer: Self-pay | Admitting: Emergency Medicine

## 2012-10-30 ENCOUNTER — Emergency Department (INDEPENDENT_AMBULATORY_CARE_PROVIDER_SITE_OTHER): Payer: Medicare Other

## 2012-10-30 DIAGNOSIS — S60221A Contusion of right hand, initial encounter: Secondary | ICD-10-CM

## 2012-10-30 DIAGNOSIS — R55 Syncope and collapse: Secondary | ICD-10-CM

## 2012-10-30 DIAGNOSIS — S60229A Contusion of unspecified hand, initial encounter: Secondary | ICD-10-CM

## 2012-10-30 MED ORDER — CEFTRIAXONE SODIUM 1 G IJ SOLR: 1.0000 g | Freq: Once | INTRAMUSCULAR | Status: DC

## 2012-10-30 NOTE — ED Notes (Signed)
C/o right hand injury.   As patient was lifting a chair last night her back popped and the chair fell on her right hand.

## 2012-10-30 NOTE — ED Notes (Signed)
Pt left AMA and did not notify any of the staff.... Dr. Denyse Amass has been notified.

## 2012-10-30 NOTE — ED Provider Notes (Signed)
Karla Foster is a 57 y.o. female who presents to Urgent Care today for right hand injury. Patient was lifting a chair yesterday evening. She slipped and hit her right hand on the floor. She has pain and swelling at the second and third metacarpal phalangeal joints. She notes that the skin surrounding the injury has been punctured and is read painful and warm. She's tried some over-the-counter pain medications which have not helped. She denies any nausea, vomiting diarrhea fevers or chills,   Patient notes towards the end of the visit that she had one episode of syncope yesterday evening. This is unusual for her. She notes one episode of vertigo followed by a fall and syncope. She did not hit her head. She said notes another episode of feeling lightheaded. She went to her bed and did not pass out. She denies any chest pains or palpitations prior to the syncopal event.   Past Medical History  Diagnosis Date  . 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 Chronic back pain   . Dyslipidemia     not Rx'd  . Hyperthyroidism     treated in past  . Depression   . Anxiety    History  Substance Use Topics  . Smoking status: Current Every Day Smoker    Types: Cigarettes  . Smokeless tobacco: Not on file     Comment: less than a pack a day  . Alcohol Use: No   ROS as above Medications reviewed. Current Facility-Administered Medications  Medication Dose Route Frequency Provider Last Rate Last Dose  . cefTRIAXone (ROCEPHIN) injection 1 g  1 g Intramuscular Once Rodolph Bong, MD       Current Outpatient Prescriptions  Medication Sig Dispense Refill  . clonazePAM (KLONOPIN) 1 MG tablet TAKE 1/2 TABLET BY MOUTH EVERY MORNING 1 TABLET AT NOON, AND TAKE 1 TABLET AT BEDTIME  75 tablet  2  . DULoxetine (CYMBALTA) 60 MG capsule Take 60 mg by mouth at bedtime.       Marland Kitchen esomeprazole (NEXIUM) 40 MG capsule Take 40 mg by mouth daily before breakfast.      . trazodone (DESYREL)  300 MG tablet Take 300 mg by mouth at bedtime.        Exam:  BP 120/81  Pulse 79  Temp(Src) 98.9 F (37.2 C) (Oral)  Resp 18  SpO2 95% Gen: Well NAD HEENT: EOMI,  MMM Lungs: CTABL Nl WOB Heart: RRR no MRG Abd: NABS, NT, ND Exts: Non edematous BL  LE, warm and well perfused.  MSK: Right hand swollen overlying second and third metacarpophalangeal joints. Tender palpation normal motion capillary refill sensation intact. Strength is intact. No redness or swelling extending proximally or distally.   No results found for this or any previous visit (from the past 24 hour(s)). Dg Hand Complete Right  10/30/2012   CLINICAL DATA:  Pain and swelling post injury right hand 2nd through 4th metacarpal and right index and middle finger.  EXAM: RIGHT HAND - COMPLETE 3+ VIEW  COMPARISON:  None.  FINDINGS: Three views of the right hand submitted. No acute fracture or subluxation. No radiopaque foreign body. Mild narrowing of radiocarpal joint space. Soft tissue swelling noted distal metacarpal region especially dorsally on lateral view.  IMPRESSION: No acute fracture or subluxation. Soft tissue swelling distal metacarpal region. Mild narrowing of radiocarpal joint space.   Electronically Signed   By: Natasha Mead   On: 10/30/2012 09:10  Assessment and Plan: 57 y.o. female with  1) contusion with possible infection of the right hand. X-rays do not show fracture.  The patient left AMA without informing staff prior to administration of ceftriaxone. Her medications were not prescribed.   2) syncope: Patient left AMA without informing the staff. This was done prior to obtain an EKG.         Rodolph Bong, MD 10/30/12 [phone]

## 2012-12-10 ENCOUNTER — Telehealth: Payer: Self-pay | Admitting: Family Medicine

## 2012-12-23 ENCOUNTER — Ambulatory Visit (INDEPENDENT_AMBULATORY_CARE_PROVIDER_SITE_OTHER): Payer: Medicare Other | Admitting: Family Medicine

## 2012-12-23 ENCOUNTER — Encounter: Payer: Self-pay | Admitting: Family Medicine

## 2012-12-23 VITALS — BP 110/78 | HR 82 | Temp 98.4°F | Resp 18 | Wt 185.0 lb

## 2012-12-23 DIAGNOSIS — F4321 Adjustment disorder with depressed mood: Secondary | ICD-10-CM

## 2012-12-23 DIAGNOSIS — F3289 Other specified depressive episodes: Secondary | ICD-10-CM

## 2012-12-23 MED ORDER — LAMOTRIGINE 25 MG PO TABS: 50.0000 mg | ORAL_TABLET | Freq: Every day | ORAL | Status: DC

## 2012-12-23 MED ORDER — DULOXETINE HCL 60 MG PO CPEP: 60.0000 mg | ORAL_CAPSULE | Freq: Every day | ORAL | Status: DC

## 2012-12-23 MED ORDER — ESOMEPRAZOLE MAGNESIUM 40 MG PO CPDR: 40.0000 mg | DELAYED_RELEASE_CAPSULE | Freq: Every day | ORAL | Status: DC

## 2012-12-23 MED ORDER — CLONAZEPAM 1 MG PO TABS: ORAL_TABLET | ORAL | Status: DC

## 2012-12-23 MED ORDER — TRAZODONE HCL 300 MG PO TABS: 300.0000 mg | ORAL_TABLET | Freq: Every day | ORAL | Status: DC

## 2012-12-23 NOTE — Progress Notes (Signed)
  Subjective:    Patient ID: Karla Foster, female    DOB: 22-Sep-1955, 57 y.o.   MRN: [id number]  HPI Patient's significant other recently passed away suddenly after surgery. This has exacerbated her severe depression. She is currently on Cymbalta 60 mg by mouth daily, trazodone 300 mg by mouth each bedtime and Klonopin 1 mg tablets up to 3 times a day.  She reports severe depression. She is unable to herself out of bed in the morning. She has no desire to do anything. She is unable to sleep due to grief and anxiety. She denies suicidal ideation. She denies hallucinations or paranoia or delusions. She spends the majority of the day crying. Her dog is also dying.  She recently suffered the loss of 2 elderly friends had been very close to her. Past Medical History  Diagnosis Date  . 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 Chronic back pain   . Dyslipidemia     not Rx'd  . Hyperthyroidism     treated in past  . Depression   . Anxiety    No current outpatient prescriptions on file prior to visit.   No current facility-administered medications on file prior to visit.   No Known Allergies Past Surgical History  Procedure Laterality Date  . Cholecystectomy, laparoscopic    . Vaginal hysterectomy    . Bladder repair      after hysterectomy  . Transthoracic echocardiogram      EF> 55%; mild to moderate aortic regurgitation.   History   Social History  . Marital Status: Divorced    Spouse Name: [name]    Number of Children: [name]  . Years of Education: [name]   Occupational History  . disabled secondary to back pain    Social History Main Topics  . Smoking status: Current Every Day Smoker    Types: Cigarettes  . Smokeless tobacco: Not on file     Comment: less than a pack a day  . Alcohol Use: No  . Drug Use: No  . Sexual Activity: Not on file   Other Topics Concern  . Not on file   Social History Narrative  . No narrative on file      Review  of Systems  All other systems reviewed and are negative.       Objective:   Physical Exam  Vitals reviewed. Cardiovascular: Normal rate, regular rhythm and normal heart sounds.   Pulmonary/Chest: Effort normal and breath sounds normal. No respiratory distress. She has no wheezes. She has no rales.  Abdominal: Soft. Bowel sounds are normal.  Psychiatric: Her behavior is normal. Judgment and thought content normal. Her speech is delayed. Cognition and memory are normal. She exhibits a depressed mood.          Assessment & Plan:  1. Atypical depression Ad lamictal 50 mg by mouth each bedtime to her medications. I will gradually increase medication 200 mg by mouth each bedtime as she tolerates it. Recheck in 4 weeks. Using this to augment the SNRI that she is currently taking.   - lamoTRIgine (LAMICTAL) 25 MG tablet; Take 2 tablets (50 mg total) by mouth daily.  Dispense: 60 tablet; Refill: 1  2. Grief reaction Recheck in 4 weeks or immediately if worse.

## 2012-12-25 ENCOUNTER — Other Ambulatory Visit: Payer: Self-pay

## 2013-02-23 ENCOUNTER — Ambulatory Visit: Payer: Self-pay | Admitting: Family Medicine

## 2013-03-03 ENCOUNTER — Other Ambulatory Visit: Payer: Self-pay | Admitting: Family Medicine

## 2013-03-05 ENCOUNTER — Other Ambulatory Visit: Payer: Self-pay | Admitting: Family Medicine

## 2013-03-05 MED ORDER — CLONAZEPAM 1 MG PO TABS: ORAL_TABLET | ORAL | Status: DC

## 2013-03-05 NOTE — Telephone Encounter (Signed)
Appt CPE next week.  Clonazepam refilled x 1

## 2013-03-10 ENCOUNTER — Encounter: Payer: Self-pay | Admitting: Family Medicine

## 2013-03-10 ENCOUNTER — Ambulatory Visit (INDEPENDENT_AMBULATORY_CARE_PROVIDER_SITE_OTHER): Payer: Medicare HMO | Admitting: Family Medicine

## 2013-03-10 VITALS — BP 122/68 | HR 76 | Temp 97.8°F | Resp 18 | Ht 67.0 in | Wt 187.0 lb

## 2013-03-10 DIAGNOSIS — M25569 Pain in unspecified knee: Secondary | ICD-10-CM

## 2013-03-10 DIAGNOSIS — M549 Dorsalgia, unspecified: Secondary | ICD-10-CM

## 2013-03-10 DIAGNOSIS — F411 Generalized anxiety disorder: Secondary | ICD-10-CM

## 2013-03-10 DIAGNOSIS — M25562 Pain in left knee: Secondary | ICD-10-CM

## 2013-03-10 MED ORDER — CLONAZEPAM 1 MG PO TABS: ORAL_TABLET | ORAL | Status: DC

## 2013-03-10 MED ORDER — MELOXICAM 15 MG PO TABS: 15.0000 mg | ORAL_TABLET | Freq: Every day | ORAL | Status: DC

## 2013-03-10 NOTE — Progress Notes (Signed)
Subjective:    Patient ID: Karla Foster, female    DOB: 01/14/1956, 58 y.o.   MRN: [id number]  HPI Patient presents today requesting a refill on her Klonopin. At the patient's last office visit I started the patient on the mid pole in addition to her Cymbalta for atypical depression. The patient felt a little did not help her symptoms at all. Therefore she discontinued the Lamictal on her own without my knowledge. She continues to take Cymbalta on a daily basis. She uses Klonopin 1 mg tablets 3 times a day to help control her anxiety.  Patient states this is working well for her at the present time. She denies any suicidal ideation. She denies any hallucinations. She denies any delusional thinking. She does complain of left knee pain. She states that sometimes her patella will dislocate when she lies down in bed at night. It always spontaneously relocates. Chest complains of knee pain with walking. She reports low back pain on the left side. The pain in her knee is a constant dull ache.  The pain in her back is located to the left side of the paraspinal muscles. It is worse with forward flexion and extension. The patient denies any erythema or effusion in the knee joint. She denies any specific injury to the knee joint. Past Medical History  Diagnosis Date  . 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 Chronic back pain   . Dyslipidemia     not Rx'd  . Hyperthyroidism     treated in past  . Depression   . Anxiety    Current Outpatient Prescriptions on File Prior to Visit  Medication Sig Dispense Refill  . DULoxetine (CYMBALTA) 60 MG capsule Take 1 capsule (60 mg total) by mouth at bedtime.  30 capsule  5  . esomeprazole (NEXIUM) 40 MG capsule Take 1 capsule (40 mg total) by mouth daily before breakfast.  30 capsule  5  . trazodone (DESYREL) 300 MG tablet Take 1 tablet (300 mg total) by mouth at bedtime.  30 tablet  5   No current facility-administered  medications on file prior to visit.   No Known Allergies History   Social History  . Marital Status: Divorced    Spouse Name: [name]    Number of Children: [name]  . Years of Education: [name]   Occupational History  . disabled secondary to back pain    Social History Main Topics  . Smoking status: Current Every Day Smoker    Types: Cigarettes  . Smokeless tobacco: Not on file     Comment: less than a pack a day  . Alcohol Use: No  . Drug Use: No  . Sexual Activity: Not on file   Other Topics Concern  . Not on file   Social History Narrative  . No narrative on file      Review of Systems  All other systems reviewed and are negative.       Objective:   Physical Exam  Vitals reviewed. Cardiovascular: Normal rate, regular rhythm and normal heart sounds.   Pulmonary/Chest: Effort normal and breath sounds normal. No respiratory distress. She has no wheezes. She has no rales. She exhibits no tenderness.  Abdominal: Soft. Bowel sounds are normal.  Musculoskeletal:       Left knee: She exhibits decreased range of motion. She exhibits no swelling, no effusion, no ecchymosis, no deformity, normal alignment, no LCL laxity, normal patellar mobility, no bony tenderness,  normal meniscus and no MCL laxity. Tenderness found. Medial joint line, lateral joint line and patellar tendon tenderness noted. No MCL and no LCL tenderness noted.       Lumbar back: She exhibits decreased range of motion, tenderness and pain. She exhibits no bony tenderness, no swelling, no edema, no deformity and no spasm.          Assessment & Plan:  1. Knee pain, left I suspect patellofemoral syndrome. I recommended Mobic 15 mg by mouth daily. If the patient's knee pain is no better in 2 weeks I recommend panel x-rays as well as physical - meloxicam (MOBIC) 15 MG tablet; Take 1 tablet (15 mg total) by mouth daily.  Dispense: 30 tablet; Refill: 0  2. GAD (generalized anxiety disorder) The patient's Klonopin in  addition to her Cymbalta. At the present time her anxiety and depression is acceptably controlled. - clonazePAM (KLONOPIN) 1 MG tablet; TAKE 1/2 TABLET BY MOUTH EVERY MORNING 1 TABLET AT NOON, AND TAKE 1 TABLET AT BEDTIME  Dispense: 75 tablet; Refill: 0  3. Back pain Patient has a history of chronic low back pain. I believe this is likely muscular in nature. I recommend physical therapy but the patient is not interested in that at this time. I would also recommend trying meloxicam 15 mg PA daily to see if this helps her back pain as well.

## 2013-04-08 ENCOUNTER — Telehealth: Payer: Self-pay | Admitting: Family Medicine

## 2013-04-08 DIAGNOSIS — F411 Generalized anxiety disorder: Secondary | ICD-10-CM

## 2013-04-08 NOTE — Telephone Encounter (Signed)
Requesting a refill on Clonazepam 1mg  1/2 tab qam, 1 tab at noon, 1 tab qhs - ? OK to Refill

## 2013-04-09 MED ORDER — CLONAZEPAM 1 MG PO TABS: ORAL_TABLET | ORAL | Status: DC

## 2013-04-09 NOTE — Telephone Encounter (Signed)
ok 

## 2013-04-09 NOTE — Telephone Encounter (Signed)
Med called to pharm 

## 2013-05-06 ENCOUNTER — Telehealth: Payer: Self-pay | Admitting: Family Medicine

## 2013-05-06 DIAGNOSIS — F411 Generalized anxiety disorder: Secondary | ICD-10-CM

## 2013-05-06 NOTE — Telephone Encounter (Signed)
Requesting refill on her Clonazepam - ? OK to Refill and put refill on???

## 2013-05-07 MED ORDER — CLONAZEPAM 1 MG PO TABS: ORAL_TABLET | ORAL | Status: DC

## 2013-05-07 NOTE — Telephone Encounter (Signed)
ok 

## 2013-05-07 NOTE — Telephone Encounter (Signed)
Med c/o with 3 months refills

## 2013-05-25 ENCOUNTER — Ambulatory Visit (INDEPENDENT_AMBULATORY_CARE_PROVIDER_SITE_OTHER): Payer: Medicare HMO | Admitting: Family Medicine

## 2013-05-25 ENCOUNTER — Encounter: Payer: Self-pay | Admitting: Family Medicine

## 2013-05-25 VITALS — BP 130/72 | HR 78 | Temp 97.6°F | Resp 18 | Ht 67.0 in | Wt 186.0 lb

## 2013-05-25 DIAGNOSIS — M25519 Pain in unspecified shoulder: Secondary | ICD-10-CM

## 2013-05-25 DIAGNOSIS — M25512 Pain in left shoulder: Secondary | ICD-10-CM

## 2013-05-25 NOTE — Progress Notes (Signed)
Subjective:    Patient ID: Karla Foster, female    DOB: 09/16/1955, 58 y.o.   MRN: [id number]  HPI Patient on the ice in January. Ever since she's had pain in her left shoulder. It is progressively worsening. She can now only abduct the arm to 80 without severe pain. And passive abduction hurts a tremendous amount.  She has pain with internal and external rotation. She also complains of some numbness in her hand. She denies any pain radiating from her neck down her arm. She denies any weakness in grip strength. She has a negative Spurling sign. She has normal reflexes checked at the biceps as well as brachioradialis and triceps.   Past Medical History  Diagnosis Date  . 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 Chronic back pain   . Dyslipidemia     not Rx'd  . Hyperthyroidism     treated in past  . Depression   . Anxiety    Current Outpatient Prescriptions on File Prior to Visit  Medication Sig Dispense Refill  . clonazePAM (KLONOPIN) 1 MG tablet TAKE 1/2 TABLET BY MOUTH EVERY MORNING 1 TABLET AT NOON, AND TAKE 1 TABLET AT BEDTIME  75 tablet  2  . DULoxetine (CYMBALTA) 60 MG capsule Take 1 capsule (60 mg total) by mouth at bedtime.  30 capsule  5  . esomeprazole (NEXIUM) 40 MG capsule Take 1 capsule (40 mg total) by mouth daily before breakfast.  30 capsule  5  . trazodone (DESYREL) 300 MG tablet Take 1 tablet (300 mg total) by mouth at bedtime.  30 tablet  5   No current facility-administered medications on file prior to visit.   No Known Allergies History   Social History  . Marital Status: Divorced    Spouse Name: [name]    Number of Children: [name]  . Years of Education: [name]   Occupational History  . disabled secondary to back pain    Social History Main Topics  . Smoking status: Current Every Day Smoker    Types: Cigarettes  . Smokeless tobacco: Not on file     Comment: less than a pack a day  . Alcohol Use: No  . Drug Use: No  . Sexual  Activity: Not on file   Other Topics Concern  . Not on file   Social History Narrative  . No narrative on file      Review of Systems  All other systems reviewed and are negative.       Objective:   Physical Exam  Vitals reviewed. Musculoskeletal:       Left shoulder: She exhibits decreased range of motion, tenderness, pain, spasm and decreased strength. She exhibits no bony tenderness, no swelling, no effusion and no crepitus.   patient has pain with abduction greater than 80. She has a positive empty can sign. She has a positive Hawkins sign. His negative O'Brien sign. She has a negative Spurling sign. Strength of abduction is limited to 3-4/5 although there is questionable effort.        Assessment & Plan:  1. Shoulder pain, left Based on her exam I do not feel that this is cervical radiculopathy. I do feel that she has tendinitis in the supraspinatous muscle. I cannot exclude a rotator cuff tear given history of a fall. After obtaining informed consent, and using sterile technique, I injected the left shoulder with 2 cc of lidocaine 0.1%, 2 cc of Marcaine, and 2  cc of 40 mg per mL Kenalog. I obtained an MRI of the shoulder. Recheck in 2 weeks if no better or sooner if worse. - MR Shoulder Left Wo Contrast; Future

## 2013-05-30 ENCOUNTER — Ambulatory Visit
Admission: RE | Admit: 2013-05-30 | Discharge: 2013-05-30 | Disposition: A | Discharge status: Home / Self Care | Payer: Commercial Managed Care - HMO | Source: Ambulatory Visit | Attending: Family Medicine | Admitting: Family Medicine

## 2013-05-30 DIAGNOSIS — M25512 Pain in left shoulder: Secondary | ICD-10-CM

## 2013-06-01 ENCOUNTER — Encounter: Payer: Self-pay | Admitting: Family Medicine

## 2013-06-01 ENCOUNTER — Other Ambulatory Visit: Payer: Self-pay | Admitting: *Deleted

## 2013-06-01 DIAGNOSIS — M25519 Pain in unspecified shoulder: Secondary | ICD-10-CM

## 2013-06-01 DIAGNOSIS — M779 Enthesopathy, unspecified: Secondary | ICD-10-CM

## 2013-06-08 ENCOUNTER — Ambulatory Visit: Payer: Medicare HMO | Admitting: Family Medicine

## 2013-06-29 ENCOUNTER — Encounter: Payer: Self-pay | Admitting: *Deleted

## 2013-07-15 ENCOUNTER — Other Ambulatory Visit: Payer: Self-pay | Admitting: Family Medicine

## 2013-07-15 MED ORDER — ESOMEPRAZOLE MAGNESIUM 40 MG PO CPDR: 40.0000 mg | DELAYED_RELEASE_CAPSULE | Freq: Every day | ORAL | Status: DC

## 2013-07-15 MED ORDER — DULOXETINE HCL 60 MG PO CPEP: 60.0000 mg | ORAL_CAPSULE | Freq: Every day | ORAL | Status: DC

## 2013-07-15 NOTE — Telephone Encounter (Signed)
Rx Refilled  

## 2013-08-07 ENCOUNTER — Telehealth: Payer: Self-pay | Admitting: *Deleted

## 2013-08-07 NOTE — Telephone Encounter (Signed)
Submitted [employer] referral thru [employer] for authorization, received authorization immediately authorization number is [id number] for Dr. Erlinda Hong at [facility], authorization was faxed to that facilitiy

## 2013-08-20 ENCOUNTER — Other Ambulatory Visit: Payer: Self-pay | Admitting: Family Medicine

## 2013-08-20 NOTE — Telephone Encounter (Signed)
Ok to refill??  Last office visit 05/25/2013.  Last refill 05/07/2013.

## 2013-08-20 NOTE — Telephone Encounter (Signed)
Medication called to pharmacy. 

## 2013-08-20 NOTE — Telephone Encounter (Signed)
ok 

## 2013-08-25 ENCOUNTER — Telehealth: Payer: Self-pay | Admitting: *Deleted

## 2013-08-25 MED ORDER — TRAZODONE HCL 300 MG PO TABS: 300.0000 mg | ORAL_TABLET | Freq: Every day | ORAL | Status: DC

## 2013-08-25 NOTE — Telephone Encounter (Signed)
Received fax requesting refill on Trazodone.  Refill appropriate and filled per protocol.

## 2013-09-17 ENCOUNTER — Ambulatory Visit (INDEPENDENT_AMBULATORY_CARE_PROVIDER_SITE_OTHER): Payer: Medicare HMO | Admitting: Physician Assistant

## 2013-09-17 ENCOUNTER — Encounter: Payer: Self-pay | Admitting: Physician Assistant

## 2013-09-17 VITALS — BP 108/56 | HR 64 | Temp 98.4°F | Resp 12

## 2013-09-17 DIAGNOSIS — H8110 Benign paroxysmal vertigo, unspecified ear: Secondary | ICD-10-CM

## 2013-09-17 MED ORDER — MECLIZINE HCL 25 MG PO TABS: 25.0000 mg | ORAL_TABLET | Freq: Three times a day (TID) | ORAL | Status: DC | PRN

## 2013-09-17 NOTE — Progress Notes (Signed)
Patient ID: Karla Foster MRN: [id number], DOB: September 03, 1955, 58 y.o. Date of Encounter: 09/17/2013, 11:09 AM    Chief Complaint:  Chief Complaint  Patient presents with  . Dizziness     HPI: 58 y.o. year old white female says that she has had this happen before but it has been several years ago.  Says this episode started yesterday. Says that she will start to have vertigo and things will be spinning in a circle. States that it comes on anytime she has any change in body position or if she turns side to side. Says that it is making her feel a little bit nauseous and she feels staggery when she tries to walk.  She has not had any recent symptoms of viral URI. Has had no rhinorrhea or mucus from the nose. No sore throat and no ear ache no fevers or chills. No cough.  She has not had any other recent onset of any other neurologic changes.     Home Meds:   Outpatient Prescriptions Prior to Visit  Medication Sig Dispense Refill  . clonazePAM (KLONOPIN) 1 MG tablet TAKE 1/2 TABLET BY MOUTH IN AM, 1 TABLET AT NOON, 1 TABLET AT BEDTIME *MUST LAST 30 DAYS*  75 tablet  2  . DULoxetine (CYMBALTA) 60 MG capsule Take 1 capsule (60 mg total) by mouth at bedtime.  30 capsule  5  . esomeprazole (NEXIUM) 40 MG capsule Take 1 capsule (40 mg total) by mouth daily before breakfast.  30 capsule  5  . trazodone (DESYREL) 300 MG tablet Take 1 tablet (300 mg total) by mouth at bedtime.  30 tablet  5   No facility-administered medications prior to visit.    Allergies: No Known Allergies    Review of Systems: See HPI for pertinent ROS. All other ROS negative.    Physical Exam: Blood pressure 108/56, pulse 64, temperature 98.4 F (36.9 C), temperature source Oral, resp. rate 12., There is no weight on file to calculate BMI. General:  WNWD WF. She is sitting in a wheelchair in the exam room because she feels dizzy and unsteady. Appears in no acute distress. HEENT: Normocephalic, atraumatic,  eyes without discharge, sclera non-icteric, nares are without discharge. Bilateral auditory canals clear, TM's are without perforation, pearly grey and translucent with reflective cone of light bilaterally. Oral cavity moist, posterior pharynx without exudate, erythema, peritonsillar abscess, or post nasal drip.  Neck: Supple. No thyromegaly. No lymphadenopathy. Lungs: Clear bilaterally to auscultation without wheezes, rales, or rhonchi. Breathing is unlabored. Heart: Regular rhythm. No murmurs, rubs, or gallops. Positive [facility] she moves head side to side, even small amount, this causes spinning sensation to occur. Msk:  Strength and tone normal for age. Extremities/Skin: Warm and dry. Neuro: Alert and oriented X 3. Moves all extremities spontaneously. Gait is normal. CNII-XII grossly in tact.Romberg is intact.  Psych:  She is quiet, withdrawn affect but answers questions appropriately.      ASSESSMENT AND PLAN:  58 y.o. year old female with  1. Benign paroxysmal positional vertigo, unspecified laterality She is to take the meclizine every 6 hours as needed. I told her that hopefully this will resolve on its and over the next several days. If this does not resolve and it does continue for a full week and she needs to follow up with [place]. If that occurs then she may need referral to ENT for Apley maneuvers. - meclizine (ANTIVERT) 25 MG tablet; Take 1 tablet (25 mg  total) by mouth 3 (three) times daily as needed for dizziness.  Dispense: 30 tablet; Refill: 0   Signed, [address] [place], [place], [facility] 09/17/2013 11:09 AM

## 2013-11-23 ENCOUNTER — Other Ambulatory Visit: Payer: Self-pay | Admitting: Family Medicine

## 2013-11-23 NOTE — Telephone Encounter (Signed)
?   OK to Refill  

## 2013-11-23 NOTE — Telephone Encounter (Signed)
ok 

## 2014-02-10 ENCOUNTER — Other Ambulatory Visit: Payer: Self-pay | Admitting: Family Medicine

## 2014-03-11 ENCOUNTER — Telehealth: Payer: Self-pay | Admitting: Family Medicine

## 2014-03-11 MED ORDER — TRAZODONE HCL 300 MG PO TABS: 300.0000 mg | ORAL_TABLET | Freq: Every day | ORAL | Status: DC

## 2014-03-11 NOTE — Telephone Encounter (Signed)
ok 

## 2014-03-11 NOTE — Telephone Encounter (Signed)
Requesting a refill on her Trazodone - LOV 09/02/13 - ? OK to Refill

## 2014-03-11 NOTE — Telephone Encounter (Signed)
Med sent to pharm 

## 2014-03-18 ENCOUNTER — Other Ambulatory Visit: Payer: Self-pay | Admitting: Family Medicine

## 2014-03-18 NOTE — Telephone Encounter (Signed)
rx called in

## 2014-03-18 NOTE — Telephone Encounter (Signed)
LRF 11/25/13  #75 + 2.  LOV 09/17/13  OK refill?

## 2014-03-18 NOTE — Telephone Encounter (Signed)
ok 

## 2014-04-10 ENCOUNTER — Other Ambulatory Visit: Payer: Self-pay | Admitting: Family Medicine

## 2014-04-13 ENCOUNTER — Ambulatory Visit: Payer: Medicare HMO | Admitting: Family Medicine

## 2014-04-13 ENCOUNTER — Telehealth: Payer: Self-pay | Admitting: Family Medicine

## 2014-04-13 NOTE — Telephone Encounter (Signed)
Patient has appointment today with dr pickard. She would like you to call her because she cannot walk? Please call her at [phone]

## 2014-04-14 NOTE — Telephone Encounter (Signed)
LMTRC

## 2014-04-20 NOTE — Telephone Encounter (Signed)
Pt no showed for her appt and no return phone call - closing encounter

## 2014-04-21 ENCOUNTER — Telehealth: Payer: Self-pay | Admitting: Family Medicine

## 2014-04-21 NOTE — Telephone Encounter (Signed)
Pharmacy req refill Clonazepam.  LRF 03/18/14  # 75.  LOV 09/17/13  Was No Show 04/13/14  OK refill?

## 2014-04-22 ENCOUNTER — Encounter: Payer: Self-pay | Admitting: Family Medicine

## 2014-04-22 MED ORDER — CLONAZEPAM 1 MG PO TABS: ORAL_TABLET | ORAL | Status: DC

## 2014-04-22 NOTE — Telephone Encounter (Signed)
Ok for 2 week refill but ntbs.

## 2014-04-22 NOTE — Telephone Encounter (Signed)
2 week refill called in.  Left pt message that needs appt before any further refills

## 2014-04-23 ENCOUNTER — Telehealth: Payer: Self-pay | Admitting: Family Medicine

## 2014-04-23 NOTE — Telephone Encounter (Signed)
-----   Message from [place] sent at 04/23/2014 10:31 AM EST ----- Patient requesting to speak to you regarding previous message in system [phone]

## 2014-04-23 NOTE — Telephone Encounter (Signed)
lmtrc

## 2014-04-30 NOTE — Telephone Encounter (Signed)
LMTRC

## 2014-05-11 ENCOUNTER — Ambulatory Visit (INDEPENDENT_AMBULATORY_CARE_PROVIDER_SITE_OTHER): Payer: Medicare HMO | Admitting: Family Medicine

## 2014-05-11 ENCOUNTER — Encounter: Payer: Self-pay | Admitting: Family Medicine

## 2014-05-11 VITALS — BP 110/60 | HR 80 | Temp 97.6°F | Resp 20 | Wt 191.0 lb

## 2014-05-11 DIAGNOSIS — H8110 Benign paroxysmal vertigo, unspecified ear: Secondary | ICD-10-CM | POA: Diagnosis not present

## 2014-05-11 DIAGNOSIS — K5909 Other constipation: Secondary | ICD-10-CM | POA: Diagnosis not present

## 2014-05-11 DIAGNOSIS — F329 Major depressive disorder, single episode, unspecified: Secondary | ICD-10-CM

## 2014-05-11 DIAGNOSIS — F32A Depression, unspecified: Secondary | ICD-10-CM

## 2014-05-11 LAB — CBC WITH DIFFERENTIAL/PLATELET
BASOS ABS: 0.2 10*3/uL — AB (ref 0.0–0.1)
Basophils Relative: 1 % (ref 0–1)
EOS PCT: 1 % (ref 0–5)
Eosinophils Absolute: 0.2 10*3/uL (ref 0.0–0.7)
HCT: 43.4 % (ref 36.0–46.0)
Hemoglobin: 14.4 g/dL (ref 12.0–15.0)
LYMPHS PCT: 25 % (ref 12–46)
Lymphs Abs: 4 10*3/uL (ref 0.7–4.0)
MCH: 29.9 pg (ref 26.0–34.0)
MCHC: 33.2 g/dL (ref 30.0–36.0)
MCV: 90.2 fL (ref 78.0–100.0)
MPV: 9.9 fL (ref 8.6–12.4)
Monocytes Absolute: 1.1 10*3/uL — ABNORMAL HIGH (ref 0.1–1.0)
Monocytes Relative: 7 % (ref 3–12)
Neutro Abs: 10.5 10*3/uL — ABNORMAL HIGH (ref 1.7–7.7)
Neutrophils Relative %: 66 % (ref 43–77)
PLATELETS: 360 10*3/uL (ref 150–400)
RBC: 4.81 MIL/uL (ref 3.87–5.11)
RDW: 14.4 % (ref 11.5–15.5)
WBC: 15.9 10*3/uL — AB (ref 4.0–10.5)

## 2014-05-11 LAB — COMPLETE METABOLIC PANEL WITH GFR
ALBUMIN: 4.2 g/dL (ref 3.5–5.2)
ALT: 8 U/L (ref 0–35)
AST: 20 U/L (ref 0–37)
Alkaline Phosphatase: 90 U/L (ref 39–117)
BILIRUBIN TOTAL: 0.3 mg/dL (ref 0.2–1.2)
BUN: 9 mg/dL (ref 6–23)
CALCIUM: 9.9 mg/dL (ref 8.4–10.5)
CHLORIDE: 103 meq/L (ref 96–112)
CO2: 24 meq/L (ref 19–32)
Creat: 0.89 mg/dL (ref 0.50–1.10)
GFR, EST AFRICAN AMERICAN: 82 mL/min
GFR, Est Non African American: 71 mL/min
GLUCOSE: 134 mg/dL — AB (ref 70–99)
POTASSIUM: 4.4 meq/L (ref 3.5–5.3)
SODIUM: 139 meq/L (ref 135–145)
TOTAL PROTEIN: 7 g/dL (ref 6.0–8.3)

## 2014-05-11 MED ORDER — MECLIZINE HCL 25 MG PO TABS: 25.0000 mg | ORAL_TABLET | Freq: Three times a day (TID) | ORAL | Status: DC | PRN

## 2014-05-11 NOTE — Progress Notes (Signed)
Subjective:    Patient ID: Karla Foster, female    DOB: 02-23-1955, 59 y.o.   MRN: [id number]  HPI Patient is here today for follow-up of her chronic medical conditions. She has a history of severe depression. She is currently on Cymbalta 60 mg a day and Klonopin which she uses as needed for anxiety and insomnia. This seems to work well for the patient. She states that her depression and anxiety are adequately controlled. She does still take trazodone at night to help her sleep and she would like to continue this medication. Her biggest concern today is her chronic constipation. She is tried increasing her fluids, increasing her fiber intake, and she states that she exercises every day and yet she still has problems with chronic constipation. She has tried MiraLAX over-the-counter without relief. Patient states that she is constipated 3 weeks out of 4 and that she usually has to take a laxative to help her go to the bathroom. She is also complaining of episodic vertigo-like dizziness with position changes. She would like a refill on her meclizine. Otherwise she is doing well with no complaints Past Medical History  Diagnosis Date  . 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 Chronic back pain   . Dyslipidemia     not Rx'd  . Hyperthyroidism     treated in past  . Depression   . Anxiety    Past Surgical History  Procedure Laterality Date  . Cholecystectomy, laparoscopic    . Vaginal hysterectomy    . Bladder repair      after hysterectomy  . Transthoracic echocardiogram      EF> 55%; mild to moderate aortic regurgitation.   Current Outpatient Prescriptions on File Prior to Visit  Medication Sig Dispense Refill  . clonazePAM (KLONOPIN) 1 MG tablet TAKE 1/2 TABLET BY MOUTH IN AM, 1 TABLET AT NOON, 1 TABLET AT BEDTIME *MUST LAST 30 DAYS* 35 tablet 0  . DULoxetine (CYMBALTA) 60 MG capsule TAKE 1 CAPSULE (60 MG TOTAL) BY MOUTH AT BEDTIME. 30 capsule 5  .  NEXIUM 40 MG capsule TAKE 1 CAPSULE (40 MG TOTAL) BY MOUTH DAILY BEFORE BREAKFAST. 30 capsule 5  . trazodone (DESYREL) 300 MG tablet Take 1 tablet (300 mg total) by mouth at bedtime. 30 tablet 5   No current facility-administered medications on file prior to visit.   No Known Allergies History   Social History  . Marital Status: Divorced    Spouse Name: [name]  . Number of Children: [name]  . Years of Education: [name]   Occupational History  . disabled secondary to back pain    Social History Main Topics  . Smoking status: Current Every Day Smoker    Types: Cigarettes  . Smokeless tobacco: Not on file     Comment: less than a pack a day  . Alcohol Use: No  . Drug Use: No  . Sexual Activity: Not on file   Other Topics Concern  . Not on file   Social History Narrative      Review of Systems  All other systems reviewed and are negative.      Objective:   Physical Exam  Constitutional: She appears well-developed and well-nourished.  Neck: Neck supple. No thyromegaly present.  Cardiovascular: Normal rate, regular rhythm and normal heart sounds.   Pulmonary/Chest: Effort normal and breath sounds normal. No respiratory distress. She has no wheezes. She has no rales.  Abdominal: Soft. Bowel sounds  are normal. She exhibits no distension and no mass. There is tenderness. There is no rebound and no guarding.  Musculoskeletal: She exhibits no edema.  Lymphadenopathy:    She has no cervical adenopathy.  Vitals reviewed.         Assessment & Plan:  Other constipation - Plan: COMPLETE METABOLIC PANEL WITH GFR, CBC with Differential/Platelet  Benign paroxysmal positional vertigo, unspecified laterality - Plan: meclizine (ANTIVERT) 25 MG tablet  Depression  Patient's exam today is relatively normal. I will continue Klonopin and Cymbalta at their current dosages as her anxiety seems to be appropriately managed. Her somatic complaints have also improved at the current dosage of  these medications which I think is another reflection that her depression is well managed. She also seems to be benefiting from caring  For her 36-year-old grandson which seems to brighten her life and improve her quality of life. We will try linzess 145 mg poqday for chronic constipation.  She can use meclizine 25 mg as needed for vertigo.

## 2014-05-12 NOTE — Telephone Encounter (Signed)
Multiple calls placed to patient with no answer and no return call.   Message to be closed.  

## 2014-05-17 ENCOUNTER — Other Ambulatory Visit: Payer: Self-pay | Admitting: Family Medicine

## 2014-05-17 NOTE — Telephone Encounter (Signed)
ok 

## 2014-05-17 NOTE — Telephone Encounter (Signed)
Requesting a refill on her Clonazepam - ? OK to Refill

## 2014-05-18 MED ORDER — CLONAZEPAM 1 MG PO TABS: ORAL_TABLET | ORAL | Status: DC

## 2014-05-18 NOTE — Telephone Encounter (Signed)
Meds phoned in to pharmacy and pt aware

## 2014-05-19 ENCOUNTER — Encounter: Payer: Self-pay | Admitting: Family Medicine

## 2014-05-21 ENCOUNTER — Other Ambulatory Visit: Payer: Self-pay | Admitting: Family Medicine

## 2014-05-21 DIAGNOSIS — R7989 Other specified abnormal findings of blood chemistry: Secondary | ICD-10-CM

## 2014-06-22 ENCOUNTER — Telehealth: Payer: Self-pay | Admitting: Family Medicine

## 2014-06-22 NOTE — Telephone Encounter (Signed)
ok 

## 2014-06-22 NOTE — Telephone Encounter (Signed)
patient needs refill on her klonopin if possible  [facility]

## 2014-06-22 NOTE — Telephone Encounter (Signed)
?   OK to Refill  

## 2014-06-23 MED ORDER — CLONAZEPAM 1 MG PO TABS: ORAL_TABLET | ORAL | Status: DC

## 2014-06-23 NOTE — Telephone Encounter (Signed)
Medication called/sent to requested pharmacy  

## 2014-07-28 ENCOUNTER — Telehealth: Payer: Self-pay | Admitting: Family Medicine

## 2014-07-28 NOTE — Telephone Encounter (Signed)
Requesting a refill on Klonopin - ? OK to Refill

## 2014-07-29 ENCOUNTER — Other Ambulatory Visit: Payer: Self-pay | Admitting: Family Medicine

## 2014-07-29 MED ORDER — CLONAZEPAM 1 MG PO TABS: ORAL_TABLET | ORAL | Status: DC

## 2014-07-29 NOTE — Telephone Encounter (Signed)
Medication called/sent to requested pharmacy  

## 2014-07-29 NOTE — Telephone Encounter (Signed)
ok 

## 2014-08-05 ENCOUNTER — Encounter: Payer: Commercial Managed Care - HMO | Admitting: Family Medicine

## 2014-08-25 ENCOUNTER — Other Ambulatory Visit: Payer: Self-pay | Admitting: Family Medicine

## 2014-08-26 NOTE — Telephone Encounter (Signed)
ok 

## 2014-08-26 NOTE — Telephone Encounter (Signed)
?   OK to Refill  

## 2014-08-26 NOTE — Telephone Encounter (Signed)
Medication refilled per protocol. 

## 2014-10-01 ENCOUNTER — Other Ambulatory Visit: Payer: Self-pay | Admitting: Family Medicine

## 2014-10-04 NOTE — Telephone Encounter (Signed)
ok 

## 2014-10-04 NOTE — Telephone Encounter (Signed)
?   OK to Refill  

## 2014-11-03 ENCOUNTER — Other Ambulatory Visit: Payer: Self-pay | Admitting: Family Medicine

## 2014-11-04 NOTE — Telephone Encounter (Signed)
ok 

## 2014-11-04 NOTE — Telephone Encounter (Signed)
Medication called to pharmacy. 

## 2014-11-04 NOTE — Telephone Encounter (Signed)
Ok to refill??  Last office visit 05/11/2014.  Last refill 10/05/2014.

## 2014-12-06 ENCOUNTER — Other Ambulatory Visit: Payer: Self-pay | Admitting: Family Medicine

## 2014-12-06 NOTE — Telephone Encounter (Signed)
Ok to refill??  Last office visit 05/11/2014.  Last refill 11/04/2014.

## 2014-12-07 NOTE — Telephone Encounter (Signed)
Medication called to pharmacy. 

## 2014-12-07 NOTE — Telephone Encounter (Signed)
ok 

## 2015-01-10 ENCOUNTER — Other Ambulatory Visit: Payer: Self-pay | Admitting: Family Medicine

## 2015-01-10 NOTE — Telephone Encounter (Signed)
Ok to refill??  Last office visit 05/11/2014.  Last refill 12/07/2014.

## 2015-01-11 ENCOUNTER — Other Ambulatory Visit: Payer: Self-pay | Admitting: Family Medicine

## 2015-01-11 NOTE — Telephone Encounter (Signed)
Med called in

## 2015-01-11 NOTE — Telephone Encounter (Signed)
Request denied.   Called to pharmacy on 01/11/2015.

## 2015-01-11 NOTE — Telephone Encounter (Signed)
ok 

## 2015-01-12 ENCOUNTER — Other Ambulatory Visit: Payer: Self-pay | Admitting: Family Medicine

## 2015-01-12 ENCOUNTER — Telehealth: Payer: Self-pay | Admitting: Family Medicine

## 2015-01-12 MED ORDER — TRAZODONE HCL 300 MG PO TABS: 300.0000 mg | ORAL_TABLET | Freq: Every day | ORAL | Status: DC

## 2015-01-12 NOTE — Telephone Encounter (Signed)
Medication called/sent to requested pharmacy  

## 2015-01-18 ENCOUNTER — Telehealth: Payer: Self-pay | Admitting: Family Medicine

## 2015-01-18 NOTE — Telephone Encounter (Signed)
Patient is having trouble getting her trazadone refilled wants to know if something else can be called in and discuss this with you  [phone]

## 2015-01-19 NOTE — Telephone Encounter (Signed)
Called and spoke to pt and trazodone is on back order at [facility] -  Pt has been out of it for 8 days and has had all the symptoms of withdrawal. Pt is no longer seeing a counsellor as they shut down and she has not found another one yet and would like to know if there is something else she can take to help her sleep??

## 2015-01-20 MED ORDER — HYDROXYZINE HCL 25 MG PO TABS: ORAL_TABLET | ORAL | Status: DC

## 2015-01-20 NOTE — Telephone Encounter (Signed)
Medication called/sent to requested pharmacy - pt aware and states that she can not afford it at the moment but she will go get as soon as possible.

## 2015-01-20 NOTE — Telephone Encounter (Signed)
Try atarax 25-50 mg poqhs prn insomnia.

## 2015-02-09 ENCOUNTER — Other Ambulatory Visit: Payer: Self-pay | Admitting: Family Medicine

## 2015-02-09 NOTE — Telephone Encounter (Signed)
?   OK to Refill  

## 2015-02-10 ENCOUNTER — Encounter: Payer: Self-pay | Admitting: *Deleted

## 2015-02-10 NOTE — Telephone Encounter (Signed)
Ok but due for ov.

## 2015-02-10 NOTE — Telephone Encounter (Signed)
Medication called to pharmacy.  Letter sent.  

## 2015-02-28 ENCOUNTER — Ambulatory Visit: Payer: Commercial Managed Care - HMO | Admitting: Family Medicine

## 2015-03-07 ENCOUNTER — Other Ambulatory Visit: Payer: Self-pay | Admitting: *Deleted

## 2015-03-07 MED ORDER — TRAZODONE HCL 300 MG PO TABS: 300.0000 mg | ORAL_TABLET | Freq: Every day | ORAL | Status: DC

## 2015-03-07 NOTE — Telephone Encounter (Signed)
Received fax requesting refill on Trazodone.  Refill appropriate and filled per protocol. 

## 2015-03-08 ENCOUNTER — Encounter: Payer: Self-pay | Admitting: Family Medicine

## 2015-03-08 ENCOUNTER — Ambulatory Visit (INDEPENDENT_AMBULATORY_CARE_PROVIDER_SITE_OTHER): Payer: Commercial Managed Care - HMO | Admitting: Family Medicine

## 2015-03-08 VITALS — BP 144/80 | HR 96 | Temp 97.9°F | Resp 14 | Wt 177.0 lb

## 2015-03-08 DIAGNOSIS — F329 Major depressive disorder, single episode, unspecified: Secondary | ICD-10-CM | POA: Diagnosis not present

## 2015-03-08 DIAGNOSIS — F411 Generalized anxiety disorder: Secondary | ICD-10-CM

## 2015-03-08 DIAGNOSIS — Z13220 Encounter for screening for lipoid disorders: Secondary | ICD-10-CM | POA: Diagnosis not present

## 2015-03-08 DIAGNOSIS — F32A Depression, unspecified: Secondary | ICD-10-CM

## 2015-03-08 LAB — CBC WITH DIFFERENTIAL/PLATELET
BASOS ABS: 0.2 10*3/uL — AB (ref 0.0–0.1)
Basophils Relative: 1 % (ref 0–1)
EOS ABS: 0.2 10*3/uL (ref 0.0–0.7)
Eosinophils Relative: 1 % (ref 0–5)
HCT: 43.5 % (ref 36.0–46.0)
HEMOGLOBIN: 14.8 g/dL (ref 12.0–15.0)
LYMPHS ABS: 3.5 10*3/uL (ref 0.7–4.0)
Lymphocytes Relative: 23 % (ref 12–46)
MCH: 30.8 pg (ref 26.0–34.0)
MCHC: 34 g/dL (ref 30.0–36.0)
MCV: 90.6 fL (ref 78.0–100.0)
MPV: 10.8 fL (ref 8.6–12.4)
Monocytes Absolute: 0.9 10*3/uL (ref 0.1–1.0)
Monocytes Relative: 6 % (ref 3–12)
NEUTROS ABS: 10.6 10*3/uL — AB (ref 1.7–7.7)
NEUTROS PCT: 69 % (ref 43–77)
PLATELETS: 326 10*3/uL (ref 150–400)
RBC: 4.8 MIL/uL (ref 3.87–5.11)
RDW: 14.3 % (ref 11.5–15.5)
WBC: 15.4 10*3/uL — ABNORMAL HIGH (ref 4.0–10.5)

## 2015-03-08 LAB — LIPID PANEL
CHOLESTEROL: 222 mg/dL — AB (ref 125–200)
HDL: 49 mg/dL (ref >=46)
LDL Cholesterol: 141 mg/dL — ABNORMAL HIGH (ref <130)
TRIGLYCERIDES: 159 mg/dL — AB (ref <150)
Total CHOL/HDL Ratio: 4.5 Ratio (ref <=5.0)
VLDL: 32 mg/dL — AB (ref <30)

## 2015-03-08 LAB — COMPLETE METABOLIC PANEL WITH GFR
ALT: 5 U/L — AB (ref 6–29)
AST: 14 U/L (ref 10–35)
Albumin: 4 g/dL (ref 3.6–5.1)
Alkaline Phosphatase: 77 U/L (ref 33–130)
BILIRUBIN TOTAL: 0.3 mg/dL (ref 0.2–1.2)
BUN: 10 mg/dL (ref 7–25)
CO2: 23 mmol/L (ref 20–31)
CREATININE: 0.73 mg/dL (ref 0.50–1.05)
Calcium: 9.3 mg/dL (ref 8.6–10.4)
Chloride: 105 mmol/L (ref 98–110)
GFR, Est Non African American: >89 mL/min (ref >=60)
GLUCOSE: 103 mg/dL — AB (ref 70–99)
Potassium: 3.9 mmol/L (ref 3.5–5.3)
SODIUM: 140 mmol/L (ref 135–146)
TOTAL PROTEIN: 6.7 g/dL (ref 6.1–8.1)

## 2015-03-08 MED ORDER — DULOXETINE HCL 60 MG PO CPEP: ORAL_CAPSULE | ORAL | Status: DC

## 2015-03-08 MED ORDER — CLONAZEPAM 1 MG PO TABS: ORAL_TABLET | ORAL | Status: DC

## 2015-03-08 MED ORDER — ESOMEPRAZOLE MAGNESIUM 40 MG PO CPDR: DELAYED_RELEASE_CAPSULE | ORAL | Status: DC

## 2015-03-08 MED ORDER — HYDROXYZINE HCL 25 MG PO TABS: ORAL_TABLET | ORAL | Status: DC

## 2015-03-08 MED ORDER — TRAZODONE HCL 300 MG PO TABS: 300.0000 mg | ORAL_TABLET | Freq: Every day | ORAL | Status: DC

## 2015-03-08 NOTE — Progress Notes (Signed)
Subjective:    Patient ID: Karla Foster, female    DOB: 10-Aug-1955, 60 y.o.   MRN: [id number]  HPI 3/16 Patient is here today for follow-up of her chronic medical conditions. She has a history of severe depression. She is currently on Cymbalta 60 mg a day and Klonopin which she uses as needed for anxiety and insomnia. This seems to work well for the patient. She states that her depression and anxiety are adequately controlled. She does still take trazodone at night to help her sleep and she would like to continue this medication. Her biggest concern today is her chronic constipation. She is tried increasing her fluids, increasing her fiber intake, and she states that she exercises every day and yet she still has problems with chronic constipation. She has tried MiraLAX over-the-counter without relief. Patient states that she is constipated 3 weeks out of 4 and that she usually has to take a laxative to help her go to the bathroom. She is also complaining of episodic vertigo-like dizziness with position changes. She would like a refill on her meclizine. Otherwise she is doing well with no complaints.  At that time, my plan was: Patient's exam today is relatively normal. I will continue Klonopin and Cymbalta at their current dosages as her anxiety seems to be appropriately managed. Her somatic complaints have also improved at the current dosage of these medications which I think is another reflection that her depression is well managed. She also seems to be benefiting from caring  For her 45-year-old grandson which seems to brighten her life and improve her quality of life. We will try linzess 145 mg poqday for chronic constipation.  She can use meclizine 25 mg as needed for vertigo.  03/08/15 Patient is here at my request for follow-up. When I asked the patient how she is doing, she states terrible. Apparently her 3 biologic children have nothing to do with her. She states that they accused her of  being a poor mother. Her grandchildren don't want to see her. She states that she has nothing to live for. She struggles to get out of bed every morning. She denies any insomnia. She states that she has thought about suicide her entire life. However she states that she would not act on these thoughts. She reports anhedonia. She reports chronic back pain. Generally she states that she feels miserable. When asked about mammogram, flu shots, and other preventative measures, the patient states that she doesn't care and she doesn't want them. Past Medical History  Diagnosis Date  . 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 Chronic back pain   . Dyslipidemia     not Rx'd  . Hyperthyroidism     treated in past  . Depression   . Anxiety    Past Surgical History  Procedure Laterality Date  . Cholecystectomy, laparoscopic    . Vaginal hysterectomy    . Bladder repair      after hysterectomy  . Transthoracic echocardiogram      EF> 55%; mild to moderate aortic regurgitation.   Current Outpatient Prescriptions on File Prior to Visit  Medication Sig Dispense Refill  . clonazePAM (KLONOPIN) 1 MG tablet TAKE 1/2 TABLET BY MOUTH IN AM AND 1 TABLET AT NOON AND 1 TABLET AT BEDTIME 75 tablet 0  . DULoxetine (CYMBALTA) 60 MG capsule TAKE 1 CAPSULE (60 MG TOTAL) BY MOUTH AT BEDTIME. 30 capsule 5  . hydrOXYzine (ATARAX/VISTARIL) 25 MG tablet  1-2 tab po qhs prn insomnia 60 tablet 1  . meclizine (ANTIVERT) 25 MG tablet Take 1 tablet (25 mg total) by mouth 3 (three) times daily as needed for dizziness. 30 tablet 0  . NEXIUM 40 MG capsule TAKE 1 CAPSULE (40 MG TOTAL) BY MOUTH DAILY BEFORE BREAKFAST. 30 capsule 5  . trazodone (DESYREL) 300 MG tablet Take 1 tablet (300 mg total) by mouth at bedtime. 90 tablet 0   No current facility-administered medications on file prior to visit.   No Known Allergies Social History   Social History  . Marital Status: Divorced    Spouse Name: [name]    . Number of Children: [name]  . Years of Education: [name]   Occupational History  . disabled secondary to back pain    Social History Main Topics  . Smoking status: Current Every Day Smoker    Types: Cigarettes  . Smokeless tobacco: Not on file     Comment: less than a pack a day  . Alcohol Use: No  . Drug Use: No  . Sexual Activity: Not on file   Other Topics Concern  . Not on file   Social History Narrative      Review of Systems  All other systems reviewed and are negative.      Objective:   Physical Exam  Constitutional: She appears well-developed and well-nourished.  Neck: Neck supple. No thyromegaly present.  Cardiovascular: Normal rate, regular rhythm and normal heart sounds.   Pulmonary/Chest: Effort normal and breath sounds normal. No respiratory distress. She has no wheezes. She has no rales.  Abdominal: Soft. Bowel sounds are normal. She exhibits no distension and no mass. There is no tenderness. There is no rebound and no guarding.  Musculoskeletal: She exhibits no edema.  Lymphadenopathy:    She has no cervical adenopathy.  Vitals reviewed.         Assessment & Plan:  Screening for cholesterol level - Plan: CBC with Differential/Platelet, COMPLETE METABOLIC PANEL WITH GFR, Lipid panel  Depression  GAD (generalized anxiety disorder)  Patient appears extremely depressed. I recommended having her see a psychiatrist and also a psychologist for counseling. She refuses this. She states that nothing works and she doesn't want to try. I spent 20 minutes discussing with the patient strategies she could implement at home to try to reestablish relationships with her family. These included sincerely apologizing to them, hearing and agree that Mrs., and trying to make amends for them. I also recommended that she seek counseling. I recommended that she try to find some kind of activity during the day that she can do to help try to find a purpose and meaning such as  volunteering or part-time job. She has no desire to implement any of these changes. She does not want to see a psychiatrist. She does not want to see a psychologist. Patient is not suicidal and she is able to make medical decisions and therefore there is little I can do to try to help her if she does not want the help. She does not want to make any changes in her medication at this time.

## 2015-03-08 NOTE — Addendum Note (Signed)
Addended by: Shary Decamp B on: 03/08/2015 01:40 PM   Modules accepted: Orders

## 2015-03-10 ENCOUNTER — Encounter: Payer: Self-pay | Admitting: Family Medicine

## 2015-03-11 ENCOUNTER — Encounter: Payer: Self-pay | Admitting: Gastroenterology

## 2015-03-11 ENCOUNTER — Other Ambulatory Visit: Payer: Self-pay | Admitting: Family Medicine

## 2015-03-11 DIAGNOSIS — Z1231 Encounter for screening mammogram for malignant neoplasm of breast: Secondary | ICD-10-CM

## 2015-03-11 DIAGNOSIS — Z1211 Encounter for screening for malignant neoplasm of colon: Secondary | ICD-10-CM

## 2015-04-06 ENCOUNTER — Ambulatory Visit (AMBULATORY_SURGERY_CENTER): Payer: Self-pay

## 2015-04-06 VITALS — Ht 67.0 in | Wt 177.2 lb

## 2015-04-06 DIAGNOSIS — Z1211 Encounter for screening for malignant neoplasm of colon: Secondary | ICD-10-CM

## 2015-04-06 MED ORDER — SUPREP BOWEL PREP KIT 17.5-3.13-1.6 GM/177ML PO SOLN: 1.0000 | Freq: Once | ORAL | Status: DC

## 2015-04-06 NOTE — Progress Notes (Signed)
No allergies to eggs or soy No past problems with anesthesia No diet/weight loss meds No home oyxgen  Has email and internet; registered for emmi

## 2015-04-08 ENCOUNTER — Telehealth: Payer: Self-pay | Admitting: *Deleted

## 2015-04-08 ENCOUNTER — Ambulatory Visit
Admission: RE | Admit: 2015-04-08 | Discharge: 2015-04-08 | Disposition: A | Discharge status: Home / Self Care | Payer: Commercial Managed Care - HMO | Source: Ambulatory Visit | Attending: Family Medicine | Admitting: Family Medicine

## 2015-04-08 DIAGNOSIS — Z1231 Encounter for screening mammogram for malignant neoplasm of breast: Secondary | ICD-10-CM

## 2015-04-08 NOTE — Telephone Encounter (Signed)
Submitted human[employer] referral thru [facility] for authorization to Dr. Wilfrid Lund with authorization [id number]  Requesting provider: Flonnie Hailstone  Treating provider: Merlene Morse  Number of visits:6  Start Date: 04/20/15  End Date: 10/17/15  Dx: Z12.11- Encounter for screening for malignant neoplasm of colon

## 2015-04-12 ENCOUNTER — Other Ambulatory Visit: Payer: Self-pay | Admitting: Family Medicine

## 2015-04-12 DIAGNOSIS — R928 Other abnormal and inconclusive findings on diagnostic imaging of breast: Secondary | ICD-10-CM

## 2015-04-18 ENCOUNTER — Ambulatory Visit
Admission: RE | Admit: 2015-04-18 | Discharge: 2015-04-18 | Disposition: A | Discharge status: Home / Self Care | Payer: Commercial Managed Care - HMO | Source: Ambulatory Visit | Attending: Family Medicine | Admitting: Family Medicine

## 2015-04-18 DIAGNOSIS — R928 Other abnormal and inconclusive findings on diagnostic imaging of breast: Secondary | ICD-10-CM

## 2015-04-20 ENCOUNTER — Ambulatory Visit (AMBULATORY_SURGERY_CENTER): Payer: Commercial Managed Care - HMO | Admitting: Gastroenterology

## 2015-04-20 ENCOUNTER — Encounter: Payer: Self-pay | Admitting: Gastroenterology

## 2015-04-20 ENCOUNTER — Other Ambulatory Visit: Payer: Self-pay | Admitting: Gastroenterology

## 2015-04-20 ENCOUNTER — Telehealth: Payer: Self-pay

## 2015-04-20 VITALS — BP 102/58 | HR 80 | Temp 98.7°F | Resp 12 | Ht 67.0 in | Wt 177.0 lb

## 2015-04-20 DIAGNOSIS — K621 Rectal polyp: Secondary | ICD-10-CM | POA: Diagnosis not present

## 2015-04-20 DIAGNOSIS — D128 Benign neoplasm of rectum: Secondary | ICD-10-CM

## 2015-04-20 DIAGNOSIS — Z1211 Encounter for screening for malignant neoplasm of colon: Secondary | ICD-10-CM | POA: Diagnosis not present

## 2015-04-20 DIAGNOSIS — D129 Benign neoplasm of anus and anal canal: Secondary | ICD-10-CM

## 2015-04-20 DIAGNOSIS — K6389 Other specified diseases of intestine: Secondary | ICD-10-CM

## 2015-04-20 MED ORDER — SODIUM CHLORIDE 0.9 % IV SOLN: 500.0000 mL | INTRAVENOUS | Status: DC

## 2015-04-20 NOTE — Progress Notes (Signed)
Called to room to assist during endoscopic procedure.  Patient ID and intended procedure confirmed with present staff. Received instructions for my participation in the procedure from the performing physician.  

## 2015-04-20 NOTE — Telephone Encounter (Signed)
Please schedule this patient a CT scan of the Abdomen and pelvis for abnormality found on colonoscopy today. She was given oral contrast (to take later for the scan) by the endoscopy nurse.  I told the patient she would hear from you tomorrow. Thanks

## 2015-04-20 NOTE — Op Note (Signed)
[place]  [address] [place], [address]   COLONOSCOPY PROCEDURE REPORT  PATIENT: Karla Foster, Karla Foster  MR#: [id number] BIRTHDATE: 09-Sep-1955 , 58  yrs. old GENDER: female ENDOSCOPIST: Wilfrid Lund, MD REFERRED [id number] Pickard, MD PROCEDURE DATE:  04/20/2015 PROCEDURE:   Colonoscopy, screening and Colonoscopy with biopsy First Screening Colonoscopy - Avg.  risk and is 50 yrs.  old or older Yes.  Prior Negative Screening - Now for repeat screening. [name]  History of Adenoma - Now for follow-up colonoscopy & has been > or = to 3 yrs.  [name]  Polyps removed today? Yes ASA CLASS:   Class I INDICATIONS:Screening for colonic neoplasia and Colorectal Neoplasm Risk Assessment for this procedure is average risk. MEDICATIONS: Monitored anesthesia care and Propofol 380 mg IV  DESCRIPTION OF PROCEDURE:   After the risks benefits and alternatives of the procedure were thoroughly explained, informed consent was obtained.  The digital rectal exam revealed no abnormalities of the rectum.   The LB [id number] and LB [id number]  endoscope was introduced through the anus and advanced to the cecum, which was identified by both the appendix and ileocecal valve. No adverse events experienced.   The quality of the prep was excellent.  (Suprep was used)  The instrument was then slowly withdrawn as the colon was fully examined. Estimated blood loss is zero unless otherwise noted in this procedure report.      COLON FINDINGS:The entire colon was redundant and tortuous (especially the sigmoid), requiring change to a pediatric colonoscope. The cecum had an altered anatomy, appearing as if there may be an extrinsic compression.A sessile polyp measuring 2 mm in size was found in the rectum.  A polypectomy was performed with cold forceps.  The resection was complete, the polyp tissue was completely retrieved and sent to histology.  Retroflexed views revealed no abnormalities.  The time to cecum = 19.4 Withdrawal time = 11.9   The scope was withdrawn and the procedure completed. COMPLICATIONS: There were no immediate complications.  ENDOSCOPIC IMPRESSION: Sessile polyp was found in the rectum; polypectomy was performed with cold forceps  RECOMMENDATIONS: 1.  Repeat colonoscopy in 5 years if polyp adenomatous; otherwise 10 years 2.  CT scan abdomen and pelvis with contrast to rule out mass lesion causing extrinsic compression of cecum.  eSigned:  Wilfrid Lund, MD 04/20/2015 11:28 AM   cc:   PATIENT NAME:  Karla, Foster MR#: [id number]

## 2015-04-20 NOTE — Telephone Encounter (Signed)
CT scan abdomen and pelvis with contrast to rule out mass lesion causing extrinsic compression of cecum.

## 2015-04-20 NOTE — Patient Instructions (Addendum)
We will order a CT scan of the abdomen and pelvis to be sure that there is nothing abnormally compressing the first part of the colon.  Discharge instructions given. Handout on polyps. Contrast given in recovery room. Resume previous medications. YOU HAD AN ENDOSCOPIC PROCEDURE TODAY AT [facility]:   Refer to the procedure report that was given to you for any specific questions about what was found during the examination.  If the procedure report does not answer your questions, please call your gastroenterologist to clarify.  If you requested that your care partner not be given the details of your procedure findings, then the procedure report has been included in a sealed envelope for you to review at your convenience later.  YOU SHOULD EXPECT: Some feelings of bloating in the abdomen. Passage of more gas than usual.  Walking can help get rid of the air that was put into your GI tract during the procedure and reduce the bloating. If you had a lower endoscopy (such as a colonoscopy or flexible sigmoidoscopy) you may notice spotting of blood in your stool or on the toilet paper. If you underwent a bowel prep for your procedure, you may not have a normal bowel movement for a few days.  Please Note:  You might notice some irritation and congestion in your nose or some drainage.  This is from the oxygen used during your procedure.  There is no need for concern and it should clear up in a day or so.  SYMPTOMS TO REPORT IMMEDIATELY:   Following lower endoscopy (colonoscopy or flexible sigmoidoscopy):  Excessive amounts of blood in the stool  Significant tenderness or worsening of abdominal pains  Swelling of the abdomen that is new, acute  Fever of 100F or higher    For urgent or emergent issues, a gastroenterologist can be reached at any hour by calling [phone].   DIET: Your first meal following the procedure should be a small meal and then it is ok to progress to your  normal diet. Heavy or fried foods are harder to digest and may make you feel nauseous or bloated.  Likewise, meals heavy in dairy and vegetables can increase bloating.  Drink plenty of fluids but you should avoid alcoholic beverages for 24 hours.  ACTIVITY:  You should plan to take it easy for the rest of today and you should NOT DRIVE or use heavy machinery until tomorrow (because of the sedation medicines used during the test).    FOLLOW UP: Our staff will call the number listed on your records the next business day following your procedure to check on you and address any questions or concerns that you may have regarding the information given to you following your procedure. If we do not reach you, we will leave a message.  However, if you are feeling well and you are not experiencing any problems, there is no need to return our call.  We will assume that you have returned to your regular daily activities without incident.  If any biopsies were taken you will be contacted by phone or by letter within the next 1-3 weeks.  Please call us at [phone] if you have not heard about the biopsies in 3 weeks.    SIGNATURES/CONFIDENTIALITY: You and/or your care partner have signed paperwork which will be entered into your electronic medical record.  These signatures attest to the fact that that the information above on your After Visit Summary has been reviewed and is  understood.  Full responsibility of the confidentiality of this discharge information lies with you and/or your care-partner. 

## 2015-04-20 NOTE — Progress Notes (Signed)
A/ox3 pleased with MAC, report to Celia RN 

## 2015-04-21 ENCOUNTER — Telehealth: Payer: Self-pay | Admitting: *Deleted

## 2015-04-21 NOTE — Telephone Encounter (Signed)
You have been scheduled for a CT scan of the abdomen and pelvis at [facility] ([address]---this is in the same building as [facility]).   You are scheduled on 04/25/15 at 11:30 am. You should arrive 15 minutes prior to your appointment time for registration. Please follow the written instructions below on the day of your exam:  WARNING: IF YOU ARE ALLERGIC TO IODINE/X-RAY DYE, PLEASE NOTIFY RADIOLOGY IMMEDIATELY AT [phone]! YOU WILL BE GIVEN A 13 HOUR PREMEDICATION PREP.  1) Do not eat or drink anything after 730 am (4 hours prior to your test) 2) You have been given 2 bottles of oral contrast to drink. The solution may taste better if refrigerated, but do NOT add ice or any other liquid to this solution. Shake well before drinking.    Drink 1 bottle of contrast @ 930 am (2 hours prior to your exam)  Drink 1 bottle of contrast @ 1030 am (1 hour prior to your exam)  You may take any medications as prescribed with a small amount of water except for the following: Metformin, Glucophage, Glucovance, Avandamet, Riomet, Fortamet, Actoplus Met, Janumet, Glumetza or Metaglip. The above medications must be held the day of the exam AND 48 hours after the exam.  The purpose of you drinking the oral contrast is to aid in the visualization of your intestinal tract. The contrast solution may cause some diarrhea. Before your exam is started, you will be given a small amount of fluid to drink. Depending on your individual set of symptoms, you may also receive an intravenous injection of x-ray contrast/dye. Plan on being at [facility] for 30 minutes or longer, depending on the type of exam you are having performed.  This test typically takes 30-45 minutes to complete.  If you have any questions regarding your exam or if you need to reschedule, you may call the CT department at [phone] between the hours of 8:00 am and 5:00 pm,  Monday-Friday.  ________________________________________________________________________ Please get labs prior to the CT scan on Monday, the basement lab is open 730 am to 5 pm    Labs in EPIC pt has been notified via My Chart and message left on home phone

## 2015-04-21 NOTE — Telephone Encounter (Signed)
  Follow up Call-  Call back number 04/20/2015  Post procedure Call Back phone  # [phone]  Permission to leave phone message Yes     Patient questions:  Message left to call us if necessary.

## 2015-04-22 ENCOUNTER — Telehealth: Payer: Self-pay | Admitting: *Deleted

## 2015-04-22 ENCOUNTER — Other Ambulatory Visit (INDEPENDENT_AMBULATORY_CARE_PROVIDER_SITE_OTHER): Payer: Commercial Managed Care - HMO

## 2015-04-22 DIAGNOSIS — K639 Disease of intestine, unspecified: Secondary | ICD-10-CM | POA: Diagnosis not present

## 2015-04-22 DIAGNOSIS — K6389 Other specified diseases of intestine: Secondary | ICD-10-CM

## 2015-04-22 LAB — CREATININE, SERUM: CREATININE: 0.75 mg/dL (ref 0.40–1.20)

## 2015-04-22 LAB — BUN: BUN: 10 mg/dL (ref 6–23)

## 2015-04-22 NOTE — Telephone Encounter (Signed)
Received fax from [facility] care management with authorization for CT abd/pelv w/Contrast with authorization number [id number]  Requesting provider: Dirk Dress, MD  Treating provider: [facility]  Place of service: Imaging center  PCP: Flonnie Hailstone  Type of service: CT  Procedure: [id number] abd/pel w/contrast  Number of visits: 1  Start date: 04/25/15  End date: 10/22/15  Dx:K63.9- Disease of intestine,unspecified

## 2015-04-25 ENCOUNTER — Encounter: Payer: Self-pay | Admitting: Gastroenterology

## 2015-04-25 ENCOUNTER — Ambulatory Visit (INDEPENDENT_AMBULATORY_CARE_PROVIDER_SITE_OTHER)
Admission: RE | Admit: 2015-04-25 | Discharge: 2015-04-25 | Disposition: A | Discharge status: Home / Self Care | Payer: Commercial Managed Care - HMO | Source: Ambulatory Visit | Attending: Gastroenterology | Admitting: Gastroenterology

## 2015-04-25 DIAGNOSIS — K6389 Other specified diseases of intestine: Secondary | ICD-10-CM

## 2015-04-25 DIAGNOSIS — K639 Disease of intestine, unspecified: Secondary | ICD-10-CM

## 2015-04-25 MED ORDER — IOHEXOL 300 MG/ML  SOLN
100.0000 mL | Freq: Once | INTRAMUSCULAR | Status: AC | PRN
  Administered 2015-04-25: 100 mL via INTRAVENOUS

## 2015-05-03 ENCOUNTER — Other Ambulatory Visit: Payer: Self-pay | Admitting: Family Medicine

## 2015-05-03 DIAGNOSIS — E278 Other specified disorders of adrenal gland: Secondary | ICD-10-CM

## 2015-05-09 ENCOUNTER — Other Ambulatory Visit: Payer: Self-pay | Admitting: Family Medicine

## 2015-05-09 NOTE — Telephone Encounter (Signed)
Refill appropriate and filled per protocol. 

## 2015-05-16 ENCOUNTER — Telehealth: Payer: Self-pay | Admitting: *Deleted

## 2015-05-16 NOTE — Telephone Encounter (Signed)
Received fax from [facility] care mgmt on 05/04/15 with authorization to [facility] imaging with authorization [id number]  Requesting provider: Flonnie Hailstone  Treating provider: [facility] imaging at [facility]  Place of service: Imaging center  PCP: Flonnie Hailstone  Type of service: MRI  Procedure: [id number] abdomen w/o dy and w/dye  Number of visits:1  Start date: 05/04/15  End date: 10/31/15  Dx:E27.9- Disorder of adrenal gland,unspecified

## 2015-06-10 ENCOUNTER — Other Ambulatory Visit: Payer: Self-pay | Admitting: Family Medicine

## 2015-06-10 NOTE — Telephone Encounter (Signed)
ok 

## 2015-06-10 NOTE — Telephone Encounter (Signed)
Medication called to pharmacy. 

## 2015-06-10 NOTE — Telephone Encounter (Signed)
Ok to refill??  Last office visit/ refill 03/08/2015, #2 refills.

## 2015-06-15 ENCOUNTER — Other Ambulatory Visit: Payer: Self-pay | Admitting: Family Medicine

## 2015-06-15 MED ORDER — DULOXETINE HCL 60 MG PO CPEP: ORAL_CAPSULE | ORAL | Status: DC

## 2015-09-12 ENCOUNTER — Other Ambulatory Visit: Payer: Self-pay | Admitting: Family Medicine

## 2015-09-13 NOTE — Telephone Encounter (Signed)
ok 

## 2015-09-13 NOTE — Telephone Encounter (Signed)
Ok to refill??  Last office visit 03/08/2015.  Last refill 06/10/2015, #2 refills.

## 2015-10-10 ENCOUNTER — Encounter: Payer: Self-pay | Admitting: Family Medicine

## 2015-10-10 ENCOUNTER — Ambulatory Visit (INDEPENDENT_AMBULATORY_CARE_PROVIDER_SITE_OTHER): Payer: Commercial Managed Care - HMO | Admitting: Family Medicine

## 2015-10-10 VITALS — BP 132/84 | HR 80 | Temp 97.9°F | Resp 18 | Wt 186.0 lb

## 2015-10-10 DIAGNOSIS — F411 Generalized anxiety disorder: Secondary | ICD-10-CM

## 2015-10-10 DIAGNOSIS — G629 Polyneuropathy, unspecified: Secondary | ICD-10-CM

## 2015-10-10 DIAGNOSIS — F329 Major depressive disorder, single episode, unspecified: Secondary | ICD-10-CM

## 2015-10-10 DIAGNOSIS — F32A Depression, unspecified: Secondary | ICD-10-CM

## 2015-10-10 LAB — BASIC METABOLIC PANEL WITH GFR
BUN: 18 mg/dL (ref 7–25)
CALCIUM: 9.1 mg/dL (ref 8.6–10.4)
CO2: 23 mmol/L (ref 20–31)
CREATININE: 0.72 mg/dL (ref 0.50–0.99)
Chloride: 103 mmol/L (ref 98–110)
GLUCOSE: 75 mg/dL (ref 70–99)
Potassium: 4.3 mmol/L (ref 3.5–5.3)
Sodium: 137 mmol/L (ref 135–146)

## 2015-10-10 MED ORDER — DULOXETINE HCL 60 MG PO CPEP: ORAL_CAPSULE | ORAL | 3 refills | Status: DC
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
## 2015-12-07 ENCOUNTER — Telehealth: Payer: Self-pay | Admitting: Family Medicine

## 2015-12-07 NOTE — Telephone Encounter (Signed)
Requesting a refill on Trazodone 300mg  1 po qhs - Ok to refill??      LRF - 03/08/15 #90/1

## 2015-12-08 MED ORDER — TRAZODONE HCL 300 MG PO TABS: 300.0000 mg | ORAL_TABLET | Freq: Every day | ORAL | 1 refills | Status: DC

## 2015-12-08 NOTE — Telephone Encounter (Signed)
ok 

## 2015-12-08 NOTE — Telephone Encounter (Signed)
Medication called/sent to requested pharmacy  

## 2015-12-13 ENCOUNTER — Other Ambulatory Visit: Payer: Self-pay | Admitting: Family Medicine

## 2015-12-13 NOTE — Telephone Encounter (Signed)
Ok to refill 

## 2015-12-14 NOTE — Telephone Encounter (Signed)
ok 

## 2016-03-20 ENCOUNTER — Other Ambulatory Visit: Payer: Self-pay | Admitting: Family Medicine

## 2016-03-20 NOTE — Telephone Encounter (Signed)
ok 

## 2016-03-20 NOTE — Telephone Encounter (Signed)
Ok to refill 

## 2016-03-21 NOTE — Telephone Encounter (Signed)
Medication called to pharmacy. 

## 2016-04-13 ENCOUNTER — Encounter: Payer: Self-pay | Admitting: Family Medicine

## 2016-04-13 ENCOUNTER — Ambulatory Visit (INDEPENDENT_AMBULATORY_CARE_PROVIDER_SITE_OTHER): Payer: Self-pay | Admitting: Family Medicine

## 2016-04-13 VITALS — BP 128/72 | HR 86 | Temp 97.9°F | Resp 20 | Ht 67.0 in | Wt 190.0 lb

## 2016-04-13 DIAGNOSIS — R29898 Other symptoms and signs involving the musculoskeletal system: Secondary | ICD-10-CM | POA: Diagnosis not present

## 2016-04-13 DIAGNOSIS — M545 Low back pain, unspecified: Secondary | ICD-10-CM

## 2016-04-13 DIAGNOSIS — R296 Repeated falls: Secondary | ICD-10-CM | POA: Diagnosis not present

## 2016-04-13 DIAGNOSIS — B0229 Other postherpetic nervous system involvement: Secondary | ICD-10-CM | POA: Diagnosis not present

## 2016-04-13 LAB — URINALYSIS, MICROSCOPIC ONLY
BACTERIA UA: NONE SEEN [HPF]
CRYSTALS: NONE SEEN [HPF]
Casts: NONE SEEN [LPF]
RBC / HPF: NONE SEEN RBC/HPF (ref <=2)
Yeast: NONE SEEN [HPF]

## 2016-04-13 LAB — URINALYSIS, ROUTINE W REFLEX MICROSCOPIC
Bilirubin Urine: NEGATIVE
Glucose, UA: NEGATIVE
Hgb urine dipstick: NEGATIVE
KETONES UR: NEGATIVE
NITRITE: NEGATIVE
PH: 6 (ref 5.0–8.0)
Protein, ur: NEGATIVE
SPECIFIC GRAVITY, URINE: 1.02 (ref 1.001–1.035)

## 2016-04-13 MED ORDER — GABAPENTIN 300 MG PO CAPS: 300.0000 mg | ORAL_CAPSULE | Freq: Three times a day (TID) | ORAL | 3 refills | Status: DC
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
## 2016-04-17 ENCOUNTER — Encounter (HOSPITAL_COMMUNITY): Payer: Self-pay | Admitting: Emergency Medicine

## 2016-04-17 ENCOUNTER — Emergency Department (HOSPITAL_COMMUNITY): Payer: PPO

## 2016-04-17 ENCOUNTER — Emergency Department (HOSPITAL_COMMUNITY)
Admission: EM | Admit: 2016-04-17 | Discharge: 2016-04-18 | Disposition: A | Discharge status: Home / Self Care | Payer: PPO | Attending: Emergency Medicine | Admitting: Emergency Medicine

## 2016-04-17 DIAGNOSIS — I10 Essential (primary) hypertension: Secondary | ICD-10-CM | POA: Insufficient documentation

## 2016-04-17 DIAGNOSIS — M546 Pain in thoracic spine: Secondary | ICD-10-CM | POA: Diagnosis not present

## 2016-04-17 DIAGNOSIS — W1789XA Other fall from one level to another, initial encounter: Secondary | ICD-10-CM | POA: Diagnosis not present

## 2016-04-17 DIAGNOSIS — S32018A Other fracture of first lumbar vertebra, initial encounter for closed fracture: Secondary | ICD-10-CM | POA: Diagnosis not present

## 2016-04-17 DIAGNOSIS — S3992XA Unspecified injury of lower back, initial encounter: Secondary | ICD-10-CM | POA: Diagnosis not present

## 2016-04-17 DIAGNOSIS — Y999 Unspecified external cause status: Secondary | ICD-10-CM | POA: Insufficient documentation

## 2016-04-17 DIAGNOSIS — Y929 Unspecified place or not applicable: Secondary | ICD-10-CM | POA: Insufficient documentation

## 2016-04-17 DIAGNOSIS — S299XXA Unspecified injury of thorax, initial encounter: Secondary | ICD-10-CM | POA: Diagnosis not present

## 2016-04-17 DIAGNOSIS — Y9389 Activity, other specified: Secondary | ICD-10-CM | POA: Insufficient documentation

## 2016-04-17 DIAGNOSIS — S32010A Wedge compression fracture of first lumbar vertebra, initial encounter for closed fracture: Secondary | ICD-10-CM

## 2016-04-17 DIAGNOSIS — T148XXA Other injury of unspecified body region, initial encounter: Secondary | ICD-10-CM | POA: Diagnosis not present

## 2016-04-17 DIAGNOSIS — F17210 Nicotine dependence, cigarettes, uncomplicated: Secondary | ICD-10-CM | POA: Insufficient documentation

## 2016-04-17 DIAGNOSIS — W19XXXA Unspecified fall, initial encounter: Secondary | ICD-10-CM

## 2016-04-17 DIAGNOSIS — M545 Low back pain: Secondary | ICD-10-CM | POA: Diagnosis not present

## 2016-04-17 DIAGNOSIS — M5489 Other dorsalgia: Secondary | ICD-10-CM | POA: Diagnosis not present

## 2016-04-17 MED ORDER — OXYCODONE-ACETAMINOPHEN 5-325 MG PO TABS
1.0000 | ORAL_TABLET | Freq: Once | ORAL | Status: AC
  Administered 2016-04-17: 1 via ORAL
  Filled 2016-04-17: qty 1

## 2016-04-17 MED ORDER — FENTANYL CITRATE (PF) 100 MCG/2ML IJ SOLN
50.0000 ug | Freq: Once | INTRAMUSCULAR | Status: AC
  Administered 2016-04-17: 50 ug via INTRAVENOUS
  Filled 2016-04-17: qty 2

## 2016-04-17 MED ORDER — HYDROCODONE-ACETAMINOPHEN 5-325 MG PO TABS: 1.0000 | ORAL_TABLET | Freq: Four times a day (QID) | ORAL | 0 refills | Status: DC | PRN

## 2016-04-17 NOTE — ED Notes (Signed)
Bed: [facility] Expected date:  Expected time:  Means of arrival:  Comments: 61 yo Fall, back pain

## 2016-04-17 NOTE — Discharge Instructions (Signed)
Please call the neurosurgeon listed in the morning to schedule a follow up appointment.  Ice affected area for pain relief. Pain medication only as needed for severe pain - This can make you very drowsy - please do not drink alcohol, operate heavy machinery or drive on this medication.  Return to ER for new or worsening symptoms, any additional concerns.
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
## 2016-04-17 NOTE — ED Notes (Signed)
[facility] contacted for discharge transportation 

## 2016-04-17 NOTE — ED Triage Notes (Signed)
Patient is coming from home.  Patient was on a two step ladder and fell backwards.  Patient states hitting head but no LOC or dizziness.  Denies cervical pain but has T & L spine pain.    EMS administered 100 mcg of Fentanyl.   20 G in LT AC.  Patient is A & O x4.  BP: 122/72 HR: 84 R:20 97% O2 on room air

## 2016-04-18 DIAGNOSIS — M549 Dorsalgia, unspecified: Secondary | ICD-10-CM | POA: Diagnosis not present

## 2016-04-18 DIAGNOSIS — R259 Unspecified abnormal involuntary movements: Secondary | ICD-10-CM | POA: Diagnosis not present

## 2016-04-30 ENCOUNTER — Inpatient Hospital Stay: Admission: RE | Admit: 2016-04-30 | Payer: No Typology Code available for payment source | Source: Ambulatory Visit

## 2016-05-11 ENCOUNTER — Telehealth: Payer: Self-pay

## 2016-05-11 NOTE — Telephone Encounter (Signed)
Patient called and stated she had been in the hospital on 2-24 due to a fall that she had.  Patient was calling to see if Dr Dennard Schaumann was aware.  Expalined to patient it was her job to call and schedule a follow up OV after she left the ED patient states she is not abl to sit up and that her back is hurting her, however patient states she is able to go to the restroom on her own.  Explained to patient she could schedule an appointment Patient then asked if she could schedule the appointment for the 1st of April. I made the appointment for 4-2

## 2016-05-21 ENCOUNTER — Ambulatory Visit: Payer: Self-pay | Admitting: Family Medicine

## 2016-05-24 ENCOUNTER — Telehealth: Payer: Self-pay | Admitting: Family Medicine

## 2016-05-24 NOTE — Telephone Encounter (Signed)
Pt has questions regarding her x-ray referral please call her.

## 2016-05-25 NOTE — Telephone Encounter (Signed)
Pt ready to have MRI of back done.  Gave her number to [facility] to call.

## 2016-06-08 ENCOUNTER — Ambulatory Visit
Admission: RE | Admit: 2016-06-08 | Discharge: 2016-06-08 | Disposition: A | Discharge status: Home / Self Care | Payer: PPO | Source: Ambulatory Visit | Attending: Family Medicine | Admitting: Family Medicine

## 2016-06-08 DIAGNOSIS — M545 Low back pain, unspecified: Secondary | ICD-10-CM

## 2016-06-08 DIAGNOSIS — R29898 Other symptoms and signs involving the musculoskeletal system: Secondary | ICD-10-CM

## 2016-06-08 DIAGNOSIS — M48061 Spinal stenosis, lumbar region without neurogenic claudication: Secondary | ICD-10-CM | POA: Diagnosis not present

## 2016-06-08 DIAGNOSIS — R296 Repeated falls: Secondary | ICD-10-CM

## 2016-06-11 ENCOUNTER — Telehealth: Payer: Self-pay | Admitting: Family Medicine

## 2016-06-11 DIAGNOSIS — B0229 Other postherpetic nervous system involvement: Secondary | ICD-10-CM

## 2016-06-11 DIAGNOSIS — S32001A Stable burst fracture of unspecified lumbar vertebra, initial encounter for closed fracture: Secondary | ICD-10-CM

## 2016-06-11 NOTE — Telephone Encounter (Signed)
Pt LMOVM calling you back she has some questions about what you talked about.  CB# [phone]

## 2016-06-11 NOTE — Telephone Encounter (Signed)
Pt given MRI results and referral sent to [facility]

## 2016-06-11 NOTE — Telephone Encounter (Signed)
-----   Message from Susy Frizzle, MD sent at 06/11/2016  6:51 AM EDT ----- Vertebral fracture at L1 and moderate spinal stenosis.  Recommend neurosurgery to evaluate for possible vertebroplasty

## 2016-06-12 MED ORDER — HYDROCODONE-ACETAMINOPHEN 5-325 MG PO TABS: 1.0000 | ORAL_TABLET | Freq: Four times a day (QID) | ORAL | 0 refills | Status: DC | PRN

## 2016-06-12 NOTE — Addendum Note (Signed)
Addended by: Shary Decamp B on: 06/12/2016 04:46 PM   Modules accepted: Orders

## 2016-06-12 NOTE — Addendum Note (Signed)
Addended by: Olena Mater on: 06/12/2016 09:47 AM   Modules accepted: Orders

## 2016-06-12 NOTE — Telephone Encounter (Signed)
Ok with Norco 5/325 q 6 prn 30

## 2016-06-12 NOTE — Telephone Encounter (Signed)
Pt was wanting something for pain.  They only gave her 10 Hydrocodones for ED.

## 2016-06-13 NOTE — Telephone Encounter (Signed)
RX printed, left up front and patient aware to pick up  

## 2016-06-22 ENCOUNTER — Other Ambulatory Visit: Payer: Self-pay | Admitting: Family Medicine

## 2016-06-22 NOTE — Telephone Encounter (Signed)
Medication called to pharmacy. 

## 2016-06-22 NOTE — Telephone Encounter (Signed)
ok 

## 2016-06-22 NOTE — Telephone Encounter (Signed)
Ok to refill 

## 2016-06-27 ENCOUNTER — Other Ambulatory Visit: Payer: Self-pay | Admitting: Family Medicine

## 2016-06-27 DIAGNOSIS — B0229 Other postherpetic nervous system involvement: Secondary | ICD-10-CM

## 2016-06-27 MED ORDER — GABAPENTIN 300 MG PO CAPS: 300.0000 mg | ORAL_CAPSULE | Freq: Three times a day (TID) | ORAL | 1 refills | Status: DC

## 2016-06-28 ENCOUNTER — Other Ambulatory Visit: Payer: Self-pay | Admitting: Neurological Surgery

## 2016-06-28 DIAGNOSIS — M549 Dorsalgia, unspecified: Secondary | ICD-10-CM | POA: Diagnosis not present

## 2016-06-28 DIAGNOSIS — S32001K Stable burst fracture of unspecified lumbar vertebra, subsequent encounter for fracture with nonunion: Secondary | ICD-10-CM | POA: Diagnosis not present

## 2016-06-29 ENCOUNTER — Other Ambulatory Visit (HOSPITAL_COMMUNITY): Payer: Self-pay | Admitting: Neurological Surgery

## 2016-06-29 DIAGNOSIS — S32001K Stable burst fracture of unspecified lumbar vertebra, subsequent encounter for fracture with nonunion: Secondary | ICD-10-CM

## 2016-07-04 ENCOUNTER — Telehealth: Payer: Self-pay | Admitting: Family Medicine

## 2016-07-04 MED ORDER — LINACLOTIDE 145 MCG PO CAPS: 145.0000 ug | ORAL_CAPSULE | Freq: Every day | ORAL | 3 refills | Status: DC

## 2016-07-04 NOTE — Telephone Encounter (Signed)
Pt called and states that Dr. Dennard Schaumann had given her samples of Linzess in [place] and would like rx sent to pharm. Medication called/sent to requested pharmacy

## 2016-07-05 NOTE — Pre-Procedure Instructions (Signed)
Karla Foster  07/05/2016      [id number] - [place], [place] [address] [place] [address] Phone: [phone] Fax: [phone]  [id number] Freddi Che, [place] - [address] [place] [address] Phone: [phone] Fax: [phone]  [id number] - [place], [place]. AT [address] [place]. [place] [address] Phone: [phone] Fax: [phone]    Your procedure is scheduled on Tuesday 07/10/2016.  Report to [facility] [place].M.  Call this number if you have problems the morning of surgery:  [phone]   Remember:  Do not eat food or drink liquids after midnight.  Take these medicines the morning of surgery with A SIP OF WATER clonazepam (Klonopin), esomeprazole (Nexium), gabapentin (Neurontin), Hydrocodone-acetaminophen (Norco/Vicodin) if needed, linaclotide (Linzess).   7 days prior to surgery STOP taking any Aspirin, Aleve, Naproxen, Ibuprofen, Motrin, Advil, Goody's, BC's, all herbal medications, fish oil, and all vitamins   Do not wear jewelry, make-up or nail polish.  Do not wear lotions, powders, or perfumes, or deoderant.  Do not shave 48 hours prior to surgery.  Men may shave face and neck.  Do not bring valuables to the hospital.  [facility] is not responsible for any belongings or valuables.  Contacts, dentures or bridgework may not be worn into surgery.  Leave your suitcase in the car.  After surgery it may be brought to your room.  For patients admitted to the hospital, discharge time will be determined by your treatment team.  Patients discharged the day of surgery will not be allowed to drive home.   Name and phone number of your driver:    Special instructions:  [place] - Preparing for Surgery  Before surgery, you can play an important  role.  Because skin is not sterile, your skin needs to be as free of germs as possible.  You can reduce the number of germs on you skin by washing with CHG (chlorahexidine gluconate) soap before surgery.  CHG is an antiseptic cleaner which kills germs and bonds with the skin to continue killing germs even after washing.  Please DO NOT use if you have an allergy to CHG or antibacterial soaps.  If your skin becomes reddened/irritated stop using the CHG and inform your nurse when you arrive at Short Stay.  Do not shave (including legs and underarms) for at least 48 hours prior to the first CHG shower.  You may shave your face.  Please follow these instructions carefully:   1.  Shower with CHG Soap the night before surgery and the                                morning of Surgery.  2.  If you choose to wash your hair, wash your hair first as usual with your       normal shampoo.  3.  After you shampoo, rinse your hair and body thoroughly to remove the                      Shampoo.  4.  Use CHG as you would any other liquid soap.  You can apply chg directly       to the skin and wash gently with scrungie or a clean washcloth.  5.  Apply the CHG Soap to your body ONLY FROM THE NECK DOWN.        Do not use on open wounds or open sores.  Avoid contact with your eyes,       ears, mouth and genitals (private parts).  Wash genitals (private parts)       with your normal soap.  6.  Wash thoroughly, paying special attention to the area where your surgery        will be performed.  7.  Thoroughly rinse your body with warm water from the neck down.  8.  DO NOT shower/wash with your normal soap after using and rinsing off       the CHG Soap.  9.  Pat yourself dry with a clean towel.            10.  Wear clean pajamas.            11.  Place clean sheets on your bed the night of your first shower and do not        sleep with pets.  Day of Surgery  Do not apply any lotions/deoderants the morning of surgery.   Please wear clean clothes to the hospital/surgery center.    Please read over the following fact sheets that you were given. Pain Booklet, MRSA Information and Surgical Site Infection Prevention

## 2016-07-06 ENCOUNTER — Ambulatory Visit (HOSPITAL_COMMUNITY)
Admission: RE | Admit: 2016-07-06 | Discharge: 2016-07-06 | Disposition: A | Discharge status: Home / Self Care | Payer: PPO | Source: Ambulatory Visit | Attending: Neurological Surgery | Admitting: Neurological Surgery

## 2016-07-06 ENCOUNTER — Encounter (HOSPITAL_COMMUNITY): Payer: Self-pay

## 2016-07-06 ENCOUNTER — Ambulatory Visit (HOSPITAL_COMMUNITY): Payer: PPO

## 2016-07-06 ENCOUNTER — Encounter (HOSPITAL_COMMUNITY)
Admission: RE | Admit: 2016-07-06 | Discharge: 2016-07-06 | Disposition: A | Discharge status: Home / Self Care | Payer: PPO | Source: Ambulatory Visit | Attending: Neurological Surgery | Admitting: Neurological Surgery

## 2016-07-06 DIAGNOSIS — E785 Hyperlipidemia, unspecified: Secondary | ICD-10-CM | POA: Insufficient documentation

## 2016-07-06 DIAGNOSIS — D72829 Elevated white blood cell count, unspecified: Secondary | ICD-10-CM | POA: Diagnosis not present

## 2016-07-06 DIAGNOSIS — F17200 Nicotine dependence, unspecified, uncomplicated: Secondary | ICD-10-CM | POA: Diagnosis not present

## 2016-07-06 DIAGNOSIS — K219 Gastro-esophageal reflux disease without esophagitis: Secondary | ICD-10-CM | POA: Insufficient documentation

## 2016-07-06 DIAGNOSIS — Z79899 Other long term (current) drug therapy: Secondary | ICD-10-CM | POA: Diagnosis not present

## 2016-07-06 DIAGNOSIS — Z01818 Encounter for other preprocedural examination: Secondary | ICD-10-CM | POA: Insufficient documentation

## 2016-07-06 DIAGNOSIS — Z0183 Encounter for blood typing: Secondary | ICD-10-CM | POA: Diagnosis not present

## 2016-07-06 DIAGNOSIS — Z0182 Encounter for allergy testing: Secondary | ICD-10-CM | POA: Diagnosis not present

## 2016-07-06 DIAGNOSIS — S32001A Stable burst fracture of unspecified lumbar vertebra, initial encounter for closed fracture: Secondary | ICD-10-CM | POA: Insufficient documentation

## 2016-07-06 DIAGNOSIS — X58XXXA Exposure to other specified factors, initial encounter: Secondary | ICD-10-CM | POA: Diagnosis not present

## 2016-07-06 HISTORY — DX: Headache, unspecified: R51.9

## 2016-07-06 HISTORY — DX: Headache: R51

## 2016-07-06 HISTORY — DX: Unspecified osteoarthritis, unspecified site: M19.90

## 2016-07-06 HISTORY — DX: Gastro-esophageal reflux disease without esophagitis: K21.9

## 2016-07-06 HISTORY — DX: Unspecified cataract: H26.9

## 2016-07-06 LAB — ABO/RH: ABO/RH(D): O POS

## 2016-07-06 LAB — CBC
HEMATOCRIT: 40 % (ref 36.0–46.0)
HEMOGLOBIN: 13.2 g/dL (ref 12.0–15.0)
MCH: 29.9 pg (ref 26.0–34.0)
MCHC: 33 g/dL (ref 30.0–36.0)
MCV: 90.5 fL (ref 78.0–100.0)
Platelets: 330 10*3/uL (ref 150–400)
RBC: 4.42 MIL/uL (ref 3.87–5.11)
RDW: 14.1 % (ref 11.5–15.5)
WBC: 16 10*3/uL — ABNORMAL HIGH (ref 4.0–10.5)

## 2016-07-06 LAB — BASIC METABOLIC PANEL
ANION GAP: 9 (ref 5–15)
BUN: 10 mg/dL (ref 6–20)
CHLORIDE: 106 mmol/L (ref 101–111)
CO2: 22 mmol/L (ref 22–32)
Calcium: 8.9 mg/dL (ref 8.9–10.3)
Creatinine, Ser: 0.66 mg/dL (ref 0.44–1.00)
GFR calc non Af Amer: >60 mL/min (ref >60)
GLUCOSE: 110 mg/dL — AB (ref 65–99)
Potassium: 3.4 mmol/L — ABNORMAL LOW (ref 3.5–5.1)
Sodium: 137 mmol/L (ref 135–145)

## 2016-07-06 LAB — TYPE AND SCREEN
ABO/RH(D): O POS
Antibody Screen: NEGATIVE

## 2016-07-06 LAB — SURGICAL PCR SCREEN
MRSA, PCR: NEGATIVE
STAPHYLOCOCCUS AUREUS: NEGATIVE

## 2016-07-09 ENCOUNTER — Encounter (HOSPITAL_COMMUNITY): Payer: Self-pay

## 2016-07-09 NOTE — Progress Notes (Signed)
Anesthesia Chart Review:  Pt is a 61 year old female scheduled for lateral approach for L1 corpectomy with expandable cage, T10-L3 dorsal fixation and fusion on 07/10/2016 with Cyndy Freeze, MD  - PCP is Jenna Luo, MD - Used to see Lyman Bishop, MD with cardiology for palpitations (found to be PACs), last office visit 07/16/12. Pt was to have echo from 2014 followed up in 1-3 years, but this has not happened.   PMH includes:  Palpitations, hyperlipidemia, hyperthyroidism, GERD. Current smoker. BMI 28.  Medications include: Nexium  Preoperative labs reviewed.  WBC 16. This appears to be a chronic problem follow by her PCP.  I routed CBC results to PCP and left voicemail for Janett Billow in Dr. Hewitt Shorts office about elevated WBC.   CXR 04/17/16: No active cardiopulmonary disease.  EKG will be obtained DOS.   Echo 06/14/11:  1. Normal LV size, systolic and diastolic function. 2. Other chambers are normal. 3. Mild to moderate aortic insufficiency. 4. Mild mitral insufficiency. 5. Normal caliber aortic root. 6. Trivial pericardial effusion. 7. No evidence of intracardiac shunt by saline contrast injection  Cardiac event monitor 06/14/11: PACs.   Cardiac CTA 06/06/11:  1.  No coronary artery disease.  The patient's total coronary artery calcium score is zero. 2.  Coronary arteries are widely patent.  Ramus intermedius incidentally noted. 3.  Right coronary artery dominance.  Reviewed case with Dr. Conrad Naylor. If no changes, I anticipate pt can proceed with surgery as scheduled.   Willeen Cass, FNP-BC [facility] Stay Surgical Center/Anesthesiology Phone: [phone] 07/09/2016 11:59 AM

## 2016-07-10 ENCOUNTER — Inpatient Hospital Stay (HOSPITAL_COMMUNITY): Payer: PPO | Admitting: Emergency Medicine

## 2016-07-10 ENCOUNTER — Inpatient Hospital Stay (HOSPITAL_COMMUNITY)
Admission: RE | Admit: 2016-07-10 | Discharge: 2016-07-13 | DRG: 458 | Disposition: A | Discharge status: Skilled Nursing Facility | Payer: PPO | Source: Ambulatory Visit | Attending: Neurological Surgery | Admitting: Neurological Surgery

## 2016-07-10 ENCOUNTER — Inpatient Hospital Stay (HOSPITAL_COMMUNITY): Payer: PPO

## 2016-07-10 ENCOUNTER — Encounter (HOSPITAL_COMMUNITY): Payer: Self-pay

## 2016-07-10 ENCOUNTER — Inpatient Hospital Stay (HOSPITAL_COMMUNITY)
Admission: RE | Disposition: A | Discharge status: Skilled Nursing Facility | Payer: Self-pay | Source: Ambulatory Visit | Attending: Neurological Surgery

## 2016-07-10 ENCOUNTER — Inpatient Hospital Stay (HOSPITAL_COMMUNITY): Payer: PPO | Admitting: Anesthesiology

## 2016-07-10 DIAGNOSIS — F419 Anxiety disorder, unspecified: Secondary | ICD-10-CM | POA: Diagnosis not present

## 2016-07-10 DIAGNOSIS — S32031A Stable burst fracture of third lumbar vertebra, initial encounter for closed fracture: Secondary | ICD-10-CM | POA: Diagnosis not present

## 2016-07-10 DIAGNOSIS — Z4889 Encounter for other specified surgical aftercare: Secondary | ICD-10-CM | POA: Diagnosis not present

## 2016-07-10 DIAGNOSIS — M4856XA Collapsed vertebra, not elsewhere classified, lumbar region, initial encounter for fracture: Principal | ICD-10-CM | POA: Diagnosis present

## 2016-07-10 DIAGNOSIS — E0590 Thyrotoxicosis, unspecified without thyrotoxic crisis or storm: Secondary | ICD-10-CM | POA: Diagnosis not present

## 2016-07-10 DIAGNOSIS — S3992XA Unspecified injury of lower back, initial encounter: Secondary | ICD-10-CM | POA: Diagnosis not present

## 2016-07-10 DIAGNOSIS — M40205 Unspecified kyphosis, thoracolumbar region: Secondary | ICD-10-CM | POA: Diagnosis not present

## 2016-07-10 DIAGNOSIS — K219 Gastro-esophageal reflux disease without esophagitis: Secondary | ICD-10-CM | POA: Diagnosis not present

## 2016-07-10 DIAGNOSIS — M1990 Unspecified osteoarthritis, unspecified site: Secondary | ICD-10-CM | POA: Diagnosis present

## 2016-07-10 DIAGNOSIS — Z01818 Encounter for other preprocedural examination: Secondary | ICD-10-CM

## 2016-07-10 DIAGNOSIS — F17210 Nicotine dependence, cigarettes, uncomplicated: Secondary | ICD-10-CM | POA: Diagnosis present

## 2016-07-10 DIAGNOSIS — I10 Essential (primary) hypertension: Secondary | ICD-10-CM | POA: Diagnosis not present

## 2016-07-10 DIAGNOSIS — W19XXXA Unspecified fall, initial encounter: Secondary | ICD-10-CM | POA: Diagnosis not present

## 2016-07-10 DIAGNOSIS — M545 Low back pain: Secondary | ICD-10-CM | POA: Diagnosis not present

## 2016-07-10 DIAGNOSIS — F329 Major depressive disorder, single episode, unspecified: Secondary | ICD-10-CM | POA: Diagnosis not present

## 2016-07-10 DIAGNOSIS — E785 Hyperlipidemia, unspecified: Secondary | ICD-10-CM | POA: Diagnosis not present

## 2016-07-10 DIAGNOSIS — Z419 Encounter for procedure for purposes other than remedying health state, unspecified: Secondary | ICD-10-CM

## 2016-07-10 DIAGNOSIS — M4325 Fusion of spine, thoracolumbar region: Secondary | ICD-10-CM | POA: Diagnosis not present

## 2016-07-10 DIAGNOSIS — R488 Other symbolic dysfunctions: Secondary | ICD-10-CM | POA: Diagnosis not present

## 2016-07-10 DIAGNOSIS — Z9049 Acquired absence of other specified parts of digestive tract: Secondary | ICD-10-CM

## 2016-07-10 DIAGNOSIS — M549 Dorsalgia, unspecified: Secondary | ICD-10-CM | POA: Diagnosis not present

## 2016-07-10 DIAGNOSIS — M5416 Radiculopathy, lumbar region: Secondary | ICD-10-CM | POA: Diagnosis not present

## 2016-07-10 DIAGNOSIS — S32000A Wedge compression fracture of unspecified lumbar vertebra, initial encounter for closed fracture: Secondary | ICD-10-CM | POA: Diagnosis present

## 2016-07-10 DIAGNOSIS — M5137 Other intervertebral disc degeneration, lumbosacral region: Secondary | ICD-10-CM | POA: Diagnosis not present

## 2016-07-10 DIAGNOSIS — M6281 Muscle weakness (generalized): Secondary | ICD-10-CM | POA: Diagnosis not present

## 2016-07-10 DIAGNOSIS — S12100A Unspecified displaced fracture of second cervical vertebra, initial encounter for closed fracture: Secondary | ICD-10-CM | POA: Diagnosis not present

## 2016-07-10 DIAGNOSIS — S32010A Wedge compression fracture of first lumbar vertebra, initial encounter for closed fracture: Secondary | ICD-10-CM | POA: Diagnosis not present

## 2016-07-10 DIAGNOSIS — S32011A Stable burst fracture of first lumbar vertebra, initial encounter for closed fracture: Secondary | ICD-10-CM | POA: Diagnosis not present

## 2016-07-10 DIAGNOSIS — Z4789 Encounter for other orthopedic aftercare: Secondary | ICD-10-CM | POA: Diagnosis not present

## 2016-07-10 DIAGNOSIS — I209 Angina pectoris, unspecified: Secondary | ICD-10-CM | POA: Diagnosis not present

## 2016-07-10 DIAGNOSIS — G4700 Insomnia, unspecified: Secondary | ICD-10-CM | POA: Diagnosis not present

## 2016-07-10 DIAGNOSIS — Z981 Arthrodesis status: Secondary | ICD-10-CM | POA: Diagnosis not present

## 2016-07-10 HISTORY — PX: APPLICATION OF ROBOTIC ASSISTANCE FOR SPINAL PROCEDURE: SHX6753

## 2016-07-10 HISTORY — PX: ANTERIOR LATERAL LUMBAR FUSION 4 LEVELS: SHX5552

## 2016-07-10 HISTORY — PX: LUMBAR SPINE SURGERY: SHX701

## 2016-07-10 HISTORY — DX: Essential (primary) hypertension: I10

## 2016-07-10 LAB — POCT I-STAT 4, (NA,K, GLUC, HGB,HCT)
GLUCOSE: 189 mg/dL — AB (ref 65–99)
Glucose, Bld: 184 mg/dL — ABNORMAL HIGH (ref 65–99)
HCT: 31 % — ABNORMAL LOW (ref 36.0–46.0)
HEMATOCRIT: 33 % — AB (ref 36.0–46.0)
HEMOGLOBIN: 11.2 g/dL — AB (ref 12.0–15.0)
Hemoglobin: 10.5 g/dL — ABNORMAL LOW (ref 12.0–15.0)
POTASSIUM: 2.9 mmol/L — AB (ref 3.5–5.1)
Potassium: 2.9 mmol/L — ABNORMAL LOW (ref 3.5–5.1)
SODIUM: 140 mmol/L (ref 135–145)
Sodium: 142 mmol/L (ref 135–145)

## 2016-07-10 SURGERY — ANTERIOR LATERAL LUMBAR FUSION 4 LEVELS
Anesthesia: General | Site: Spine Lumbar

## 2016-07-10 MED ORDER — SODIUM CHLORIDE 0.9 % IJ SOLN
INTRAMUSCULAR | Status: AC
  Filled 2016-07-10: qty 20

## 2016-07-10 MED ORDER — FLEET ENEMA 7-19 GM/118ML RE ENEM: 1.0000 | ENEMA | Freq: Once | RECTAL | Status: DC | PRN

## 2016-07-10 MED ORDER — SUCCINYLCHOLINE CHLORIDE 200 MG/10ML IV SOSY
PREFILLED_SYRINGE | INTRAVENOUS | Status: AC
Start: 2016-07-10 — End: 2016-07-10
  Filled 2016-07-10: qty 10

## 2016-07-10 MED ORDER — LACTATED RINGERS IV SOLN
INTRAVENOUS | Status: DC | PRN
  Administered 2016-07-10 (×3): via INTRAVENOUS

## 2016-07-10 MED ORDER — PROPOFOL 10 MG/ML IV BOLUS
INTRAVENOUS | Status: AC
  Filled 2016-07-10: qty 20

## 2016-07-10 MED ORDER — SODIUM CHLORIDE 0.9 % IR SOLN
Status: DC | PRN
  Administered 2016-07-10 (×3): 500 mL

## 2016-07-10 MED ORDER — GABAPENTIN 300 MG PO CAPS
300.0000 mg | ORAL_CAPSULE | Freq: Three times a day (TID) | ORAL | Status: DC
  Administered 2016-07-10 – 2016-07-13 (×8): 300 mg via ORAL
  Filled 2016-07-10 (×8): qty 1

## 2016-07-10 MED ORDER — LIDOCAINE 2% (20 MG/ML) 5 ML SYRINGE
INTRAMUSCULAR | Status: AC
  Filled 2016-07-10: qty 5

## 2016-07-10 MED ORDER — BUPIVACAINE LIPOSOME 1.3 % IJ SUSP
20.0000 mL | INTRAMUSCULAR | Status: AC
  Administered 2016-07-10: 20 mL
  Filled 2016-07-10: qty 20

## 2016-07-10 MED ORDER — ROCURONIUM BROMIDE 10 MG/ML (PF) SYRINGE
PREFILLED_SYRINGE | INTRAVENOUS | Status: AC
  Filled 2016-07-10: qty 5

## 2016-07-10 MED ORDER — SENNA 8.6 MG PO TABS
1.0000 | ORAL_TABLET | Freq: Two times a day (BID) | ORAL | Status: DC
  Administered 2016-07-10 – 2016-07-13 (×5): 8.6 mg via ORAL
  Filled 2016-07-10 (×6): qty 1

## 2016-07-10 MED ORDER — SUGAMMADEX SODIUM 200 MG/2ML IV SOLN
INTRAVENOUS | Status: DC | PRN
Start: 2016-07-10 — End: 2016-07-10
  Administered 2016-07-10: 160 mg via INTRAVENOUS

## 2016-07-10 MED ORDER — CYCLOBENZAPRINE HCL 10 MG PO TABS
10.0000 mg | ORAL_TABLET | Freq: Three times a day (TID) | ORAL | Status: DC | PRN
  Administered 2016-07-10 – 2016-07-13 (×6): 10 mg via ORAL
  Filled 2016-07-10 (×5): qty 1

## 2016-07-10 MED ORDER — PROPOFOL 500 MG/50ML IV EMUL
INTRAVENOUS | Status: AC
  Filled 2016-07-10: qty 150

## 2016-07-10 MED ORDER — THROMBIN 5000 UNITS EX SOLR
CUTANEOUS | Status: AC
  Filled 2016-07-10: qty 5000

## 2016-07-10 MED ORDER — PHENYLEPHRINE 40 MCG/ML (10ML) SYRINGE FOR IV PUSH (FOR BLOOD PRESSURE SUPPORT)
PREFILLED_SYRINGE | INTRAVENOUS | Status: AC
  Filled 2016-07-10: qty 10

## 2016-07-10 MED ORDER — ACETAMINOPHEN 650 MG RE SUPP: 650.0000 mg | RECTAL | Status: DC | PRN

## 2016-07-10 MED ORDER — CEFAZOLIN SODIUM-DEXTROSE 2-4 GM/100ML-% IV SOLN
2.0000 g | INTRAVENOUS | Status: AC
  Administered 2016-07-10 (×2): 2 g via INTRAVENOUS

## 2016-07-10 MED ORDER — BUPIVACAINE HCL (PF) 0.5 % IJ SOLN
INTRAMUSCULAR | Status: AC
  Filled 2016-07-10: qty 30

## 2016-07-10 MED ORDER — HYDROMORPHONE HCL 1 MG/ML IJ SOLN
1.0000 mg | INTRAMUSCULAR | Status: DC | PRN
  Administered 2016-07-10 – 2016-07-11 (×2): 1 mg via INTRAVENOUS
  Filled 2016-07-10 (×2): qty 1

## 2016-07-10 MED ORDER — MIDAZOLAM HCL 2 MG/2ML IJ SOLN
INTRAMUSCULAR | Status: AC
  Filled 2016-07-10: qty 2

## 2016-07-10 MED ORDER — ACETAMINOPHEN 10 MG/ML IV SOLN
INTRAVENOUS | Status: DC | PRN
  Administered 2016-07-10: 1000 mg via INTRAVENOUS

## 2016-07-10 MED ORDER — THROMBIN 20000 UNITS EX SOLR
CUTANEOUS | Status: AC
  Filled 2016-07-10: qty 20000

## 2016-07-10 MED ORDER — BISACODYL 10 MG RE SUPP: 10.0000 mg | Freq: Every day | RECTAL | Status: DC | PRN

## 2016-07-10 MED ORDER — VANCOMYCIN HCL 1000 MG IV SOLR
INTRAVENOUS | Status: DC | PRN
  Administered 2016-07-10: 1000 mg via TOPICAL

## 2016-07-10 MED ORDER — CEFAZOLIN SODIUM-DEXTROSE 2-4 GM/100ML-% IV SOLN
2.0000 g | Freq: Three times a day (TID) | INTRAVENOUS | Status: AC
  Administered 2016-07-10 – 2016-07-11 (×2): 2 g via INTRAVENOUS
  Filled 2016-07-10 (×2): qty 100

## 2016-07-10 MED ORDER — LIDOCAINE-EPINEPHRINE 2 %-1:100000 IJ SOLN
INTRAMUSCULAR | Status: AC
  Filled 2016-07-10: qty 1

## 2016-07-10 MED ORDER — PROPOFOL 500 MG/50ML IV EMUL
INTRAVENOUS | Status: DC | PRN
  Administered 2016-07-10: 25 ug/kg/min via INTRAVENOUS

## 2016-07-10 MED ORDER — CYCLOBENZAPRINE HCL 10 MG PO TABS
ORAL_TABLET | ORAL | Status: AC
  Filled 2016-07-10: qty 1

## 2016-07-10 MED ORDER — PHENYLEPHRINE HCL 10 MG/ML IJ SOLN
INTRAVENOUS | Status: DC | PRN
  Administered 2016-07-10: 40 ug/min via INTRAVENOUS

## 2016-07-10 MED ORDER — SODIUM CHLORIDE 0.9% FLUSH: 3.0000 mL | INTRAVENOUS | Status: DC | PRN

## 2016-07-10 MED ORDER — EPHEDRINE 5 MG/ML INJ
INTRAVENOUS | Status: AC
  Filled 2016-07-10: qty 10

## 2016-07-10 MED ORDER — CHLORHEXIDINE GLUCONATE CLOTH 2 % EX PADS: 6.0000 | MEDICATED_PAD | Freq: Once | CUTANEOUS | Status: DC

## 2016-07-10 MED ORDER — ONDANSETRON HCL 4 MG/2ML IJ SOLN
INTRAMUSCULAR | Status: AC
  Filled 2016-07-10: qty 2

## 2016-07-10 MED ORDER — SUFENTANIL CITRATE 50 MCG/ML IV SOLN
INTRAVENOUS | Status: DC | PRN
  Administered 2016-07-10: 5 ug via INTRAVENOUS
  Administered 2016-07-10: 15 ug via INTRAVENOUS
  Administered 2016-07-10 (×3): 5 ug via INTRAVENOUS
  Administered 2016-07-10: 10 ug via INTRAVENOUS
  Administered 2016-07-10: 5 ug via INTRAVENOUS

## 2016-07-10 MED ORDER — SENNOSIDES-DOCUSATE SODIUM 8.6-50 MG PO TABS
1.0000 | ORAL_TABLET | Freq: Every evening | ORAL | Status: DC | PRN
Start: 2016-07-10 — End: 2016-07-13

## 2016-07-10 MED ORDER — THROMBIN 5000 UNITS EX SOLR
OROMUCOSAL | Status: DC | PRN
  Administered 2016-07-10 (×2): 5 mL via TOPICAL

## 2016-07-10 MED ORDER — OXYCODONE HCL 5 MG PO TABS
ORAL_TABLET | ORAL | Status: AC
  Filled 2016-07-10: qty 2

## 2016-07-10 MED ORDER — ACETAMINOPHEN 325 MG PO TABS
650.0000 mg | ORAL_TABLET | ORAL | Status: DC | PRN
  Filled 2016-07-10: qty 2

## 2016-07-10 MED ORDER — ONDANSETRON HCL 4 MG/2ML IJ SOLN
INTRAMUSCULAR | Status: DC | PRN
  Administered 2016-07-10: 4 mg via INTRAVENOUS

## 2016-07-10 MED ORDER — ONDANSETRON HCL 4 MG/2ML IJ SOLN
4.0000 mg | Freq: Four times a day (QID) | INTRAMUSCULAR | Status: DC | PRN
  Administered 2016-07-10: 4 mg via INTRAVENOUS
  Filled 2016-07-10: qty 2

## 2016-07-10 MED ORDER — SUGAMMADEX SODIUM 200 MG/2ML IV SOLN
INTRAVENOUS | Status: AC
  Filled 2016-07-10: qty 2

## 2016-07-10 MED ORDER — ONDANSETRON HCL 4 MG PO TABS: 4.0000 mg | ORAL_TABLET | Freq: Four times a day (QID) | ORAL | Status: DC | PRN

## 2016-07-10 MED ORDER — OXYCODONE HCL 5 MG PO TABS
5.0000 mg | ORAL_TABLET | ORAL | Status: DC | PRN
  Administered 2016-07-10 – 2016-07-11 (×3): 10 mg via ORAL
  Filled 2016-07-10 (×2): qty 2

## 2016-07-10 MED ORDER — THROMBIN 20000 UNITS EX SOLR
CUTANEOUS | Status: DC | PRN
  Administered 2016-07-10: 20 mL via TOPICAL

## 2016-07-10 MED ORDER — PHENOL 1.4 % MT LIQD: 1.0000 | OROMUCOSAL | Status: DC | PRN

## 2016-07-10 MED ORDER — MENTHOL 3 MG MT LOZG
1.0000 | LOZENGE | OROMUCOSAL | Status: DC | PRN
Start: 2016-07-10 — End: 2016-07-13

## 2016-07-10 MED ORDER — CELECOXIB 200 MG PO CAPS
200.0000 mg | ORAL_CAPSULE | Freq: Two times a day (BID) | ORAL | Status: DC
  Administered 2016-07-10 – 2016-07-13 (×6): 200 mg via ORAL
  Filled 2016-07-10 (×6): qty 1

## 2016-07-10 MED ORDER — SODIUM CHLORIDE 0.9% FLUSH
3.0000 mL | Freq: Two times a day (BID) | INTRAVENOUS | Status: DC
  Administered 2016-07-13: 3 mL via INTRAVENOUS

## 2016-07-10 MED ORDER — VANCOMYCIN HCL 1000 MG IV SOLR
INTRAVENOUS | Status: AC
  Filled 2016-07-10: qty 1000

## 2016-07-10 MED ORDER — SODIUM CHLORIDE 0.9 % IJ SOLN
INTRAMUSCULAR | Status: DC | PRN
  Administered 2016-07-10 (×2): 10 mL

## 2016-07-10 MED ORDER — FENTANYL CITRATE (PF) 100 MCG/2ML IJ SOLN
INTRAMUSCULAR | Status: AC
  Filled 2016-07-10: qty 2

## 2016-07-10 MED ORDER — FENTANYL CITRATE (PF) 100 MCG/2ML IJ SOLN
25.0000 ug | INTRAMUSCULAR | Status: DC | PRN
  Administered 2016-07-10 (×4): 25 ug via INTRAVENOUS

## 2016-07-10 MED ORDER — ACETAMINOPHEN 10 MG/ML IV SOLN
INTRAVENOUS | Status: AC
  Filled 2016-07-10: qty 100

## 2016-07-10 MED ORDER — SUFENTANIL CITRATE 50 MCG/ML IV SOLN
INTRAVENOUS | Status: AC
  Filled 2016-07-10: qty 1

## 2016-07-10 MED ORDER — 0.9 % SODIUM CHLORIDE (POUR BTL) OPTIME
TOPICAL | Status: DC | PRN
  Administered 2016-07-10: 1000 mL

## 2016-07-10 MED ORDER — BUPIVACAINE HCL (PF) 0.5 % IJ SOLN
INTRAMUSCULAR | Status: DC | PRN
  Administered 2016-07-10: 4 mL
  Administered 2016-07-10: 10 mL

## 2016-07-10 MED ORDER — SODIUM CHLORIDE 0.9 % IV SOLN
INTRAVENOUS | Status: DC
  Administered 2016-07-10 – 2016-07-11 (×2): via INTRAVENOUS

## 2016-07-10 MED ORDER — LIDOCAINE-EPINEPHRINE 2 %-1:100000 IJ SOLN
INTRAMUSCULAR | Status: DC | PRN
  Administered 2016-07-10: 4 mL
  Administered 2016-07-10: 10 mL

## 2016-07-10 MED ORDER — DOCUSATE SODIUM 100 MG PO CAPS
100.0000 mg | ORAL_CAPSULE | Freq: Two times a day (BID) | ORAL | Status: DC
  Administered 2016-07-10 – 2016-07-13 (×6): 100 mg via ORAL
  Filled 2016-07-10 (×6): qty 1

## 2016-07-10 MED ORDER — ROCURONIUM BROMIDE 100 MG/10ML IV SOLN
INTRAVENOUS | Status: DC | PRN
  Administered 2016-07-10: 30 mg via INTRAVENOUS
  Administered 2016-07-10: 10 mg via INTRAVENOUS

## 2016-07-10 MED ORDER — ALBUMIN HUMAN 5 % IV SOLN
INTRAVENOUS | Status: DC | PRN
  Administered 2016-07-10 (×2): via INTRAVENOUS

## 2016-07-10 MED ORDER — ALUM & MAG HYDROXIDE-SIMETH 200-200-20 MG/5ML PO SUSP
30.0000 mL | Freq: Four times a day (QID) | ORAL | Status: DC | PRN
  Administered 2016-07-13: 30 mL via ORAL
  Filled 2016-07-10: qty 30

## 2016-07-10 MED ORDER — CEFAZOLIN SODIUM-DEXTROSE 2-4 GM/100ML-% IV SOLN
INTRAVENOUS | Status: AC
  Filled 2016-07-10: qty 100

## 2016-07-10 MED ORDER — EPHEDRINE SULFATE 50 MG/ML IJ SOLN
INTRAMUSCULAR | Status: DC | PRN
  Administered 2016-07-10 (×2): 10 mg via INTRAVENOUS
  Administered 2016-07-10 (×2): 5 mg via INTRAVENOUS
  Administered 2016-07-10: 10 mg via INTRAVENOUS

## 2016-07-10 MED ORDER — ARTIFICIAL TEARS OPHTHALMIC OINT
TOPICAL_OINTMENT | OPHTHALMIC | Status: AC
  Filled 2016-07-10: qty 3.5

## 2016-07-10 MED ORDER — ALBUMIN HUMAN 5 % IV SOLN
INTRAVENOUS | Status: DC | PRN
  Administered 2016-07-10: 14:00:00 via INTRAVENOUS

## 2016-07-10 MED ORDER — SODIUM CHLORIDE 0.9 % IV SOLN
250.0000 mL | INTRAVENOUS | Status: DC
Start: 2016-07-10 — End: 2016-07-13

## 2016-07-10 MED FILL — Sodium Chloride IV Soln 0.9%: INTRAVENOUS | Qty: 1000 | Status: AC

## 2016-07-10 MED FILL — Heparin Sodium (Porcine) Inj 1000 Unit/ML: INTRAMUSCULAR | Qty: 30 | Status: AC

## 2016-07-10 SURGICAL SUPPLY — 108 items
ADH SKN CLS APL DERMABOND .7 (GAUZE/BANDAGES/DRESSINGS) ×4
BAG DECANTER FOR FLEXI CONT (MISCELLANEOUS) ×6 IMPLANT
BATTALION LLIF ITRADISCAL SHIM (MISCELLANEOUS) ×2
BIT DRILL LONG 3.0X30 (BIT) IMPLANT
BIT DRILL LONG 3X80 (BIT) IMPLANT
BIT DRILL LONG 4X80 (BIT) IMPLANT
BIT DRILL SHORT 3.0X30 (BIT) IMPLANT
BIT DRILL SHORT 3X80 (BIT) IMPLANT
BLADE CLIPPER SURG (BLADE) IMPLANT
BLADE SURG 11 STRL SS (BLADE) ×1 IMPLANT
BONE ALLOSTEM MORSELIZED 5CC (Bone Implant) ×2 IMPLANT
CABLE BATTALION LLIF LIGHT (MISCELLANEOUS) ×1 IMPLANT
CARTRIDGE OIL MAESTRO DRILL (MISCELLANEOUS) ×1 IMPLANT
CHLORAPREP W/TINT 26ML (MISCELLANEOUS) ×5 IMPLANT
CONT SPEC STER OR (MISCELLANEOUS) ×4 IMPLANT
COUNTER NEEDLE 20 DBL MAG RED (NEEDLE) ×2 IMPLANT
COVER BACK TABLE 24X17X13 BIG (DRAPES) IMPLANT
DECANTER SPIKE VIAL GLASS SM (MISCELLANEOUS) ×3 IMPLANT
DERMABOND ADVANCED (GAUZE/BANDAGES/DRESSINGS) ×8
DERMABOND ADVANCED .7 DNX12 (GAUZE/BANDAGES/DRESSINGS) ×2 IMPLANT
DIFFUSER DRILL AIR PNEUMATIC (MISCELLANEOUS) ×3 IMPLANT
DILATOR INSULATED XL 8X13 (MISCELLANEOUS) ×1 IMPLANT
DISSECTOR BLUNT TIP ENDO 5MM (MISCELLANEOUS) ×2 IMPLANT
DRAPE C-ARM 42X72 X-RAY (DRAPES) ×7 IMPLANT
DRAPE C-ARMOR (DRAPES) ×5 IMPLANT
DRAPE LAPAROTOMY 100X72X124 (DRAPES) ×3 IMPLANT
DRAPE POUCH INSTRU U-SHP 10X18 (DRAPES) ×5 IMPLANT
DRAPE SHEET LG 3/4 BI-LAMINATE (DRAPES) ×5 IMPLANT
DRSG OPSITE POSTOP 4X10 (GAUZE/BANDAGES/DRESSINGS) ×4 IMPLANT
ELECT BLADE 4.0 EZ CLEAN MEGAD (MISCELLANEOUS)
ELECT BLADE 6.5 EXT (BLADE) ×2 IMPLANT
ELECT REM PT RETURN 9FT ADLT (ELECTROSURGICAL) ×6
ELECTRODE BLDE 4.0 EZ CLN MEGD (MISCELLANEOUS) IMPLANT
ELECTRODE REM PT RTRN 9FT ADLT (ELECTROSURGICAL) ×1 IMPLANT
ENDPLATE 16MM LOWER 16X45MM 0D (Plate) ×2 IMPLANT
ENDPLATE 16MM UPPER 16X45MM 0D (Plate) ×1 IMPLANT
EVACUATOR 1/8 PVC DRAIN (DRAIN) ×2 IMPLANT
FEE INTRAOP MONITOR IMPULS NCS (MISCELLANEOUS) IMPLANT
GAUZE SPONGE 4X4 16PLY XRAY LF (GAUZE/BANDAGES/DRESSINGS) IMPLANT
GLOVE BIO SURGEON STRL SZ7 (GLOVE) ×2 IMPLANT
GLOVE BIO SURGEON STRL SZ7.5 (GLOVE) ×6 IMPLANT
GLOVE BIOGEL PI IND STRL 6.5 (GLOVE) IMPLANT
GLOVE BIOGEL PI IND STRL 7.0 (GLOVE) IMPLANT
GLOVE BIOGEL PI IND STRL 7.5 (GLOVE) ×1 IMPLANT
GLOVE BIOGEL PI INDICATOR 6.5 (GLOVE) ×2
GLOVE BIOGEL PI INDICATOR 7.0 (GLOVE) ×4
GLOVE BIOGEL PI INDICATOR 7.5 (GLOVE) ×6
GLOVE ECLIPSE 9.0 STRL (GLOVE) ×2 IMPLANT
GLOVE SS BIOGEL STRL SZ 7.5 (GLOVE) ×1 IMPLANT
GLOVE SUPERSENSE BIOGEL SZ 7.5 (GLOVE) ×12
GLOVE SURG SS PI 6.5 STRL IVOR (GLOVE) ×4 IMPLANT
GOWN STRL REUS W/ TWL LRG LVL3 (GOWN DISPOSABLE) ×1 IMPLANT
GOWN STRL REUS W/ TWL XL LVL3 (GOWN DISPOSABLE) ×1 IMPLANT
GOWN STRL REUS W/TWL 2XL LVL3 (GOWN DISPOSABLE) ×4 IMPLANT
GOWN STRL REUS W/TWL LRG LVL3 (GOWN DISPOSABLE) ×15
GOWN STRL REUS W/TWL XL LVL3 (GOWN DISPOSABLE) ×3
GRAFT BN 10X1XDBM MAGNIFUSE (Bone Implant) IMPLANT
GRAFT BONE MAGNIFUSE 1X10CM (Bone Implant) ×6 IMPLANT
GUIDEWIRE 320MM BLUNT TIP (WIRE) ×1 IMPLANT
HEMOSTAT POWDER KIT SURGIFOAM (HEMOSTASIS) ×4 IMPLANT
INTRAOP MONITOR FEE IMPULS NCS (MISCELLANEOUS) ×1
INTRAOP MONITOR FEE IMPULSE (MISCELLANEOUS) ×2
KIT BASIN OR (CUSTOM PROCEDURE TRAY) ×5 IMPLANT
KIT INFUSE MEDIUM (Orthopedic Implant) ×2 IMPLANT
KIT ROOM TURNOVER OR (KITS) ×3 IMPLANT
KIT SPINE MAZOR X ROBO DISP (MISCELLANEOUS) ×2 IMPLANT
LEAD WIRE MULTI STAGE DISP (NEUROSURGERY SUPPLIES) ×1 IMPLANT
NDL HYPO 21X1.5 SAFETY (NEEDLE) ×1 IMPLANT
NDL HYPO 25X1 1.5 SAFETY (NEEDLE) ×1 IMPLANT
NEEDLE HYPO 21X1.5 SAFETY (NEEDLE) ×9 IMPLANT
NEEDLE HYPO 25X1 1.5 SAFETY (NEEDLE) IMPLANT
NS IRRIG 1000ML POUR BTL (IV SOLUTION) ×3 IMPLANT
OIL CARTRIDGE MAESTRO DRILL (MISCELLANEOUS) ×6
PACK LAMINECTOMY NEURO (CUSTOM PROCEDURE TRAY) ×5 IMPLANT
PIN HEAD 2.5X60MM (PIN) IMPLANT
PROBE BALL TIP STIM 200MM 2.3M (NEUROSURGERY SUPPLIES) ×1 IMPLANT
PUTTY DBM GRAFTON 5CC (Putty) ×2 IMPLANT
ROD TI STRT ILLICO 5.5X200 (Rod) ×4 IMPLANT
SCREW CANN PA ILLICO 4.5X40 (Screw) ×12 IMPLANT
SCREW CANNULATED 40MM (Screw) ×8 IMPLANT
SCREW SCHANZ SA 4.0MM (MISCELLANEOUS) IMPLANT
SCREW SET SPINAL STD HEXALOBE (Screw) ×20 IMPLANT
SEALER BIPOLAR AQUA 2.3 (INSTRUMENTS) ×2 IMPLANT
SHIM ITRADISCAL BATTALION LLIF (MISCELLANEOUS) IMPLANT
SPACER 16MM CORE HGHT 22-29MM (Spacer) ×1 IMPLANT
SPONGE LAP 4X18 X RAY DECT (DISPOSABLE) IMPLANT
STAPLER VISISTAT 35W (STAPLE) ×3 IMPLANT
SUT STRATAFIX 1PDS 45CM VIOLET (SUTURE) ×4 IMPLANT
SUT STRATAFIX MNCRL+ 3-0 PS-2 (SUTURE) ×2
SUT STRATAFIX MONOCRYL 3-0 (SUTURE) ×4
SUT STRATAFIX SPIRAL + 2-0 (SUTURE) ×4 IMPLANT
SUT VIC AB 0 CT1 18XCR BRD8 (SUTURE) IMPLANT
SUT VIC AB 0 CT1 8-18 (SUTURE) ×3
SUT VIC AB 1 CT1 18XBRD ANBCTR (SUTURE) ×1 IMPLANT
SUT VIC AB 1 CT1 8-18 (SUTURE) ×3
SUT VIC AB 2-0 CT1 18 (SUTURE) ×3 IMPLANT
SUT VIC AB 3-0 SH 8-18 (SUTURE) ×3 IMPLANT
SUTURE STRATFX MNCRL+ 3-0 PS-2 (SUTURE) IMPLANT
SYR 20CC LL (SYRINGE) ×6 IMPLANT
SYR 30ML LL (SYRINGE) ×3 IMPLANT
SYRINGE 35CC LL (MISCELLANEOUS) ×1 IMPLANT
TIP BLUNT NITINOL ILLICO 20 (INSTRUMENTS) ×24 IMPLANT
TOWEL GREEN STERILE (TOWEL DISPOSABLE) ×5 IMPLANT
TOWEL GREEN STERILE FF (TOWEL DISPOSABLE) ×2 IMPLANT
TRAY FOLEY W/METER SILVER 16FR (SET/KITS/TRAYS/PACK) ×3 IMPLANT
TUBE CONNECTING 12'X1/4 (SUCTIONS) ×1
TUBE CONNECTING 12X1/4 (SUCTIONS) ×1 IMPLANT
WATER STERILE IRR 1000ML POUR (IV SOLUTION) ×3 IMPLANT

## 2016-07-10 NOTE — Progress Notes (Signed)
Pt. arrived to unit from PACU. Settled in bed with call button in reach, alarm on.
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
## 2016-07-10 NOTE — Anesthesia Preprocedure Evaluation (Addendum)
Anesthesia Evaluation  Patient identified by MRN, date of birth, ID band Patient awake    Reviewed: Allergy & Precautions, NPO status , Patient's Chart, lab work & pertinent test results  Airway Mallampati: II  TM Distance: >3 FB     Dental   Pulmonary neg pulmonary ROS, Current Smoker,    breath sounds clear to auscultation       Cardiovascular hypertension, + angina  Rhythm:Regular Rate:Normal     Neuro/Psych  Headaches,    GI/Hepatic Neg liver ROS, GERD  ,  Endo/Other  Hyperthyroidism   Renal/GU negative Renal ROS     Musculoskeletal  (+) Arthritis ,   Abdominal   Peds  Hematology   Anesthesia Other Findings   Reproductive/Obstetrics                             Anesthesia Physical Anesthesia Plan  ASA: III  Anesthesia Plan: General   Post-op Pain Management:    Induction: Intravenous  Airway Management Planned: Oral ETT  Additional Equipment:   Intra-op Plan:   Post-operative Plan: Possible Post-op intubation/ventilation  Informed Consent: I have reviewed the patients History and Physical, chart, labs and discussed the procedure including the risks, benefits and alternatives for the proposed anesthesia with the patient or authorized representative who has indicated his/her understanding and acceptance.   Dental advisory given  Plan Discussed with: CRNA and Anesthesiologist  Anesthesia Plan Comments:         Anesthesia Quick Evaluation

## 2016-07-10 NOTE — Anesthesia Postprocedure Evaluation (Signed)
Anesthesia Post Note  Patient: ROSABEL SERMENO  Procedure(s) Performed: Procedure(s) (LRB): Lateral approach for Lumbar One corpectomy with expandable cage, Thoracic Ten-Lumbar Three  dorsal fixation and fusion ([name]) APPLICATION OF ROBOTIC ASSISTANCE FOR SPINAL PROCEDURE ([name])  Patient location during evaluation: PACU Anesthesia Type: General Level of consciousness: awake and alert Pain management: pain level controlled Vital Signs Assessment: post-procedure vital signs reviewed and stable Respiratory status: spontaneous breathing, nonlabored ventilation, respiratory function stable and patient connected to nasal cannula oxygen Cardiovascular status: blood pressure returned to baseline and stable Postop Assessment: no signs of nausea or vomiting Anesthetic complications: no       Last Vitals:  Vitals:   07/10/16 1736 07/10/16 1759  BP:    Pulse: 81 80  Resp: 15 19  Temp:  36.2 C    Last Pain:  Vitals:   07/10/16 1759  TempSrc:   PainSc: 6     LLE Motor Response: Purposeful movement;Responds to commands (07/10/16 1759) LLE Sensation: Full sensation (07/10/16 1759) RLE Motor Response: Purposeful movement;Responds to commands (07/10/16 1759) RLE Sensation: Full sensation (07/10/16 1759)      Tiajuana Amass

## 2016-07-10 NOTE — Transfer of Care (Signed)
Immediate Anesthesia Transfer of Care Note  Patient: Karla Foster  Procedure(s) Performed: Procedure(s) with comments: Lateral approach for Lumbar One corpectomy with expandable cage, Thoracic Ten-Lumbar Three  dorsal fixation and fusion ([name]) - Thoracic/Lumbar APPLICATION OF ROBOTIC ASSISTANCE FOR SPINAL PROCEDURE ([name]) - Thoracic/Lumbar  Patient Location: PACU  Anesthesia Type:General  Level of Consciousness: awake, alert , oriented and patient cooperative  Airway & Oxygen Therapy: Patient Spontanous Breathing and Patient connected to face mask oxygen  Post-op Assessment: Report given to RN, Post -op Vital signs reviewed and stable, Patient moving all extremities and Patient moving all extremities X 4  Post vital signs: Reviewed and stable  Last Vitals:  Vitals:   07/10/16 0615  BP: 132/63  Pulse: 72  Resp: 20  Temp: 36.7 C    Last Pain:  Vitals:   07/10/16 0650  TempSrc:   PainSc: 4       Patients Stated Pain Goal: 2 (63/33/54 5625)  Complications: No apparent anesthesia complications

## 2016-07-10 NOTE — Anesthesia Procedure Notes (Signed)
Procedure Name: Intubation Date/Time: 07/10/2016 9:28 AM Performed by: Izora Gala Pre-anesthesia Checklist: Patient identified, Emergency Drugs available, Suction available and Patient being monitored Patient Re-evaluated:Patient Re-evaluated prior to inductionOxygen Delivery Method: Circle system utilized Preoxygenation: Pre-oxygenation with 100% oxygen Intubation Type: IV induction Ventilation: Mask ventilation without difficulty Laryngoscope Size: Miller and 3 Grade View: Grade III Tube type: Oral Tube size: 7.0 mm Number of attempts: 1 Airway Equipment and Method: Stylet,  LTA kit utilized and Bite block Placement Confirmation: ETT inserted through vocal cords under direct vision,  positive ETCO2 and breath sounds checked- equal and bilateral Secured at: 22 cm Tube secured with: Tape Dental Injury: Teeth and Oropharynx as per pre-operative assessment  Future Recommendations: Recommend- induction with short-acting agent, and alternative techniques readily available

## 2016-07-10 NOTE — Progress Notes (Signed)
Pt. Does not have brace at bedside; instructed to have someone bring from home. Pt. Verbalized understanding.

## 2016-07-11 ENCOUNTER — Encounter (HOSPITAL_COMMUNITY): Payer: Self-pay | Admitting: General Practice

## 2016-07-11 LAB — BASIC METABOLIC PANEL
Anion gap: 7 (ref 5–15)
BUN: <5 mg/dL — ABNORMAL LOW (ref 6–20)
CO2: 28 mmol/L (ref 22–32)
Calcium: 8.3 mg/dL — ABNORMAL LOW (ref 8.9–10.3)
Chloride: 103 mmol/L (ref 101–111)
Creatinine, Ser: 0.53 mg/dL (ref 0.44–1.00)
GFR calc Af Amer: >60 mL/min (ref >60)
GFR calc non Af Amer: >60 mL/min (ref >60)
Glucose, Bld: 120 mg/dL — ABNORMAL HIGH (ref 65–99)
Potassium: 3.2 mmol/L — ABNORMAL LOW (ref 3.5–5.1)
Sodium: 138 mmol/L (ref 135–145)

## 2016-07-11 LAB — CBC
HCT: 26.1 % — ABNORMAL LOW (ref 36.0–46.0)
Hemoglobin: 8.4 g/dL — ABNORMAL LOW (ref 12.0–15.0)
MCH: 29.5 pg (ref 26.0–34.0)
MCHC: 32.2 g/dL (ref 30.0–36.0)
MCV: 91.6 fL (ref 78.0–100.0)
Platelets: 256 10*3/uL (ref 150–400)
RBC: 2.85 MIL/uL — ABNORMAL LOW (ref 3.87–5.11)
RDW: 14.3 % (ref 11.5–15.5)
WBC: 19.8 10*3/uL — ABNORMAL HIGH (ref 4.0–10.5)

## 2016-07-11 LAB — APTT: aPTT: 36 s (ref 24–36)

## 2016-07-11 LAB — PROTIME-INR
INR: 1.14
Prothrombin Time: 14.6 s (ref 11.4–15.2)

## 2016-07-11 MED ORDER — HYDROMORPHONE HCL 1 MG/ML IJ SOLN: 1.0000 mg | INTRAMUSCULAR | Status: DC | PRN

## 2016-07-11 MED ORDER — HYDROCODONE-ACETAMINOPHEN 5-325 MG PO TABS
1.0000 | ORAL_TABLET | ORAL | Status: DC | PRN
  Administered 2016-07-11 – 2016-07-13 (×7): 1 via ORAL
  Filled 2016-07-11 (×7): qty 1

## 2016-07-11 MED ORDER — POTASSIUM CHLORIDE CRYS ER 10 MEQ PO TBCR
10.0000 meq | EXTENDED_RELEASE_TABLET | Freq: Two times a day (BID) | ORAL | Status: DC
  Administered 2016-07-11 – 2016-07-13 (×5): 10 meq via ORAL
  Filled 2016-07-11 (×5): qty 1

## 2016-07-11 NOTE — Progress Notes (Signed)
Ordered O2 sat monitor for continous monitoring.  Awaiting delivery.  Paged Ortho tech for new TLSO brace.

## 2016-07-11 NOTE — Evaluation (Signed)
Occupational Therapy Evaluation Patient Details Name: Karla Foster MRN: [id number] DOB: 11/10/1955 Today's Date: 07/11/2016    History of Present Illness Pt is a 62 y/o female s/p L1 corpectomy and T10-L3 fusion secondary to L1 burst fx. PMH including but not limited to hyperthyroidism and anxiety.   Clinical Impression   Pt presented in bed very lethargic, but easy to temporarily arouse by calling out her name. Pt very pleasant and agreeable when able to stay awake during session. No family/caregivers present to determine baseline functioning or confirm home information. Pt currently requiring max A for ADL and mod +2 for stand to sit. Pt educated on don/doff of brace. Pt will require skilled OT in the acute setting as well as SNF level therapy upon dc to maximize safety and independence in ADL and functional transfers as Pt lives alone. Next session to focus on practice don/doff brace and precaution education during ADL.    Follow Up Recommendations  SNF;Supervision/Assistance - 24 hour    Equipment Recommendations  Other (comment) (defer to next venue)    Recommendations for Other Services       Precautions / Restrictions Precautions Precautions: Back;Fall Precaution Booklet Issued: Yes (comment) Precaution Comments: Handout was given and left in pt's room; however, did not review with pt secondary to decreased arousal Required Braces or Orthoses: Spinal Brace Spinal Brace: Thoracolumbosacral orthotic;Applied in sitting position Restrictions Weight Bearing Restrictions: No      Mobility Bed Mobility Overal bed mobility: Needs Assistance Bed Mobility: Rolling;Sidelying to Sit;Sit to Sidelying Rolling: Min guard Sidelying to sit: Mod assist     Sit to sidelying: Max assist;+2 for physical assistance General bed mobility comments: increased time, vc'ing for technique, use of bed rails, mod A to elevate trunk to achieve sitting EOB and max A x2 to return to sidelying from  sitting position  Transfers Overall transfer level: Needs assistance Equipment used: 2 person hand held assist Transfers: Sit to/from Stand Sit to Stand: Mod assist;+2 physical assistance;+2 safety/equipment         General transfer comment: increased time, vc'ing for bilateral hand placement and technique, mod A x2 to rise into standing position from bed with vc'ing for improved posture    Balance Overall balance assessment: Needs assistance Sitting-balance support: Feet supported;Single extremity supported Sitting balance-Leahy Scale: Poor Sitting balance - Comments: pt reliant on at least one UE and close min guard; total A to don TLSO in sitting Postural control: Right lateral lean Standing balance support: During functional activity;Bilateral upper extremity supported Standing balance-Leahy Scale: Poor Standing balance comment: pt reliant on external supports and mod A x2                           ADL either performed or assessed with clinical judgement   ADL Overall ADL's : Needs assistance/impaired Eating/Feeding: Minimal assistance;Sitting;Set up Eating/Feeding Details (indicate cue type and reason): limited due to lethargy Grooming: Wash/dry face;Min guard;Sitting Grooming Details (indicate cue type and reason): EOB Upper Body Bathing: Maximal assistance;Bed level   Lower Body Bathing: Bed level;Total assistance   Upper Body Dressing : Maximal assistance;Sitting Upper Body Dressing Details (indicate cue type and reason): to don brace Lower Body Dressing: Total assistance;Bed level   Toilet Transfer: Moderate assistance;+2 for physical assistance;+2 for safety/equipment;BSC   Toileting- Clothing Manipulation and Hygiene: Total assistance       Functional mobility during ADLs: Moderate assistance;+2 for physical assistance;+2 for safety/equipment General ADL Comments: Pt very  limited by lethargy. RN present to look at incision site and wound vac.      Vision Baseline Vision/History: Wears glasses Patient Visual Report: Other (comment) (unable to get accurate report)       Perception     Praxis      Pertinent Vitals/Pain Pain Assessment: 0-10 Pain Score: 2  Pain Location: back Pain Descriptors / Indicators: Sore Pain Intervention(s): Monitored during session;Repositioned     Hand Dominance     Extremity/Trunk Assessment Upper Extremity Assessment Upper Extremity Assessment: Generalized weakness   Lower Extremity Assessment Lower Extremity Assessment: Defer to PT evaluation   Cervical / Trunk Assessment Cervical / Trunk Assessment: Other exceptions Cervical / Trunk Exceptions: pt is s/p thoracic and lumbar spine sx   Communication Communication Communication: No difficulties   Cognition Arousal/Alertness: Lethargic;Suspect due to medications Behavior During Therapy: [facility] for tasks assessed/performed;Flat affect Overall Cognitive Status: Difficult to assess Area of Impairment: Attention;Following commands;Memory;Safety/judgement;Problem solving                   Current Attention Level: Selective Memory: Decreased recall of precautions;Decreased short-term memory Following Commands: Follows one step commands inconsistently;Follows one step commands with increased time Safety/Judgement: Decreased awareness of safety;Decreased awareness of deficits   Problem Solving: Slow processing;Decreased initiation;Difficulty sequencing;Requires verbal cues;Requires tactile cues General Comments: Pt stated that "The man looks like Noah" when no one was in the room except female therapists and female RN , she also looked right at the therapist and said "Did you get my Q tips out?"   General Comments       Exercises     Shoulder Instructions      Home Living Family/patient expects to be discharged to:: Private residence Living Arrangements: Alone Available Help at Discharge: Other (Comment) (pt reported having no  family/friends to assist upon d/c) Type of Home: House Home Access: Stairs to enter [employer] of Steps: 1   Home Layout: One level               Home Equipment: Walker - 2 wheels   Additional Comments: unsure of accuracy of information provided secondary to pt's arousal level, inconsistent responses and no family/caregiver present to confirm information      Prior Functioning/Environment Level of Independence: Independent with assistive device(s)        Comments: pt reported that she uses a RW; however, no family/caregivers present to confirm this information        OT Problem List: Decreased strength;Decreased range of motion;Decreased activity tolerance;Impaired balance (sitting and/or standing);Decreased safety awareness;Decreased knowledge of use of DME or AE;Decreased knowledge of precautions;Pain      OT Treatment/Interventions: Self-care/ADL training;Energy conservation;DME and/or AE instruction;Therapeutic activities;Patient/family education;Balance training    OT Goals(Current goals can be found in the care plan section) Acute Rehab OT Goals Patient Stated Goal: none stated; pt very lethargic throughout ADL Goals Pt Will Perform Grooming: sitting;with set-up Pt Will Perform Upper Body Bathing: with set-up;sitting;with adaptive equipment Pt Will Perform Lower Body Bathing: with set-up;sitting/lateral leans;with adaptive equipment Pt Will Perform Upper Body Dressing: with set-up;sitting Pt Will Transfer to Toilet: with min guard assist;ambulating;bedside commode (with RW) Pt Will Perform Toileting - Clothing Manipulation and hygiene: with supervision;sit to/from stand Additional ADL Goal #1: Pt will recall 3/3 precautions and maintain during morning ADL routine with 2 or less verbal cues Additional ADL Goal #2: Pt wil perform bed mobility at supervision level with no verbal cues prior to ADL  OT Frequency: Min 2X/week  Barriers to D/C: Decreased  caregiver support  Pt lives alone       Co-evaluation PT/OT/SLP Co-Evaluation/Treatment: Yes Reason for Co-Treatment: For patient/therapist safety;To address functional/ADL transfers;Necessary to address cognition/behavior during functional activity (Therapy tolerance) PT goals addressed during session: Mobility/safety with mobility;Balance;Strengthening/ROM;Proper use of DME OT goals addressed during session: ADL's and self-care      AM-PAC PT "6 Clicks" Daily Activity     Outcome Measure Help from another person eating meals?: A Little Help from another person taking care of personal grooming?: A Lot Help from another person toileting, which includes using toliet, bedpan, or urinal?: A Lot Help from another person bathing (including washing, rinsing, drying)?: A Lot Help from another person to put on and taking off regular upper body clothing?: Total Help from another person to put on and taking off regular lower body clothing?: Total 6 Click Score: 11   End of Session Equipment Utilized During Treatment: Gait belt;Back brace;Oxygen (2L) Nurse Communication: Mobility status;Precautions (in room with Pt)  Activity Tolerance: Patient limited by lethargy Patient left: in bed;with call bell/phone within reach;with bed alarm set;with nursing/sitter in room;with SCD's reapplied  OT Visit Diagnosis: Other abnormalities of gait and mobility (R26.89);Unsteadiness on feet (R26.81);Muscle weakness (generalized) (M62.81)                Time: 2257-5051 OT Time Calculation (min): 26 min Charges:  OT General Charges $OT Visit: 1 Procedure OT Evaluation $OT Eval Moderate Complexity: 1 Procedure G-Codes:     Hulda Humphrey OTR/L [place] 07/11/2016, 4:53 PM

## 2016-07-11 NOTE — Progress Notes (Signed)
OT Cancellation Note  Patient Details Name: Karla Foster MRN: [id number] DOB: [birth year]   Cancelled Treatment:    Reason Eval/Treat Not Completed: Medical issues which prohibited therapy. Pt does not have brace in the room, in order to be compliant with MD orders, for safety, and for full evaluation and education to occur,  OT will wait until brace is present.   [place] 07/11/2016, 8:16 AM  Hulda Humphrey OTR/L [phone]

## 2016-07-11 NOTE — Care Management Note (Signed)
Case Management Note  Patient Details  Name: Karla Foster MRN: [id number] Date of Birth: 10-23-1955  Subjective/Objective:  Pt underwent:  Lateral approach for Lumbar One corpectomy with expandable cage, Thoracic Ten-Lumbar Three  dorsal fixation and fusion. She is from home with spouse.                  Action/Plan: Awaiting PT/OT recommendations. CM following for d/c needs, physician orders.  Expected Discharge Date:                  Expected Discharge Plan:     In-House Referral:     Discharge planning Services     Post Acute Care Choice:    Choice offered to:     DME Arranged:    DME Agency:     HH Arranged:    HH Agency:     Status of Service:  In process, will continue to follow  If discussed at Long Length of Stay Meetings, dates discussed:    Additional Comments:  Pollie Friar, RN 07/11/2016, 10:50 AM

## 2016-07-11 NOTE — Consult Note (Signed)
[facility] Primary Care Navigator  07/11/2016  Karla Foster [birth year] [id number]   Met with patient at the bedside to identify possible discharge needs. Patient is lethargic, but easy to arouse by calling out her name. She dozes on and off during the visit and has to be awakened at times to complete her responses.  Patient had an elective surgical treatment due to worsening back pain and worsening weakness in her legs that had led to this admission/ surgery. Patient endorses Dr. Jenna Luo with [facility] as herprimary care provider.   [facility]ared [facility] [place] to obtain medications without any problem.   Patient states managing her own medications at home using "pill box" system weekly.  Her husband Karla Foster) providestransportation to her doctors' appointments.  Patient's husband is her primary caregiver at homeas stated.  Anticipated discharge plan is skilled nursing facility (in process) per therapists recommendation for rehabilitation to maximize safety and independence before returning back home.  Patient voiced understanding to call primary care provider's office when she returns back home, for a post discharge follow-up appointment within a week or sooner if needs arise.Patient letter (with PCP's contact number) was provided as areminder.  Patient communicated no further health management needs or concerns at this time. [facility] care management contact information provided for future needs that she may have.    For questions, please contact:  Dannielle Huh, BSN, RN- [facility] Primary Care Navigator  Telephone: [phone] [place]

## 2016-07-11 NOTE — Progress Notes (Signed)
Orthopedic Tech Progress Note Patient Details:  Karla Foster 01/25/1956 [id number]  Patient ID: [name], female   DOB: 14-Jun-1955, 61 y.o.   MRN: [id number]   [name] 07/11/2016, 12:20 PM Called in bio-tech brace order; spoke with Colletta [place]

## 2016-07-11 NOTE — Evaluation (Signed)
Physical Therapy Evaluation Patient Details Name: Karla Foster MRN: [id number] DOB: 02-Oct-1955 Today's Date: 07/11/2016   History of Present Illness  Pt is a 61 y/o female s/p L1 corpectomy and T10-L3 fusion secondary to L1 burst fx. PMH including but not limited to hyperthyroidism and anxiety.  Clinical Impression  Pt presented supine in bed with HOB elevated, awake and willing to participate in therapy session. Pt was very lethargic throughout session with inconsistent responses to therapists' questions. No family/caregivers present to determine accurate PLOF and home environment. Pt currently requires mod A x2 for sit-to-stand transfers and mod-max A for bed mobility. Pt would continue to benefit from skilled physical therapy services at this time while admitted and after d/c to address the below listed limitations in order to improve overall safety and independence with functional mobility.     Follow Up Recommendations SNF;Supervision/Assistance - 24 hour    Equipment Recommendations  None recommended by PT    Recommendations for Other Services       Precautions / Restrictions Precautions Precautions: Back;Fall Precaution Booklet Issued: Yes (comment) Precaution Comments: Handout was given and left in pt's room; however, did not review with pt secondary to decreased arousal Required Braces or Orthoses: Spinal Brace Spinal Brace: Thoracolumbosacral orthotic;Applied in sitting position Restrictions Weight Bearing Restrictions: No      Mobility  Bed Mobility Overal bed mobility: Needs Assistance Bed Mobility: Rolling;Sidelying to Sit;Sit to Sidelying Rolling: Min guard Sidelying to sit: Mod assist     Sit to sidelying: Max assist;+2 for physical assistance General bed mobility comments: increased time, vc'ing for technique, use of bed rails, mod A to elevate trunk to achieve sitting EOB and max A x2 to return to sidelying from sitting position  Transfers Overall  transfer level: Needs assistance Equipment used: 2 person hand held assist Transfers: Sit to/from Stand Sit to Stand: Mod assist;+2 physical assistance;+2 safety/equipment         General transfer comment: increased time, vc'ing for bilateral hand placement and technique, mod A x2 to rise into standing position from bed with vc'ing for improved posture  Ambulation/Gait                Stairs            Wheelchair Mobility    Modified Rankin (Stroke Patients Only)       Balance Overall balance assessment: Needs assistance Sitting-balance support: Feet supported;Single extremity supported Sitting balance-Leahy Scale: Poor Sitting balance - Comments: pt reliant on at least one UE and close min guard; total A to don TLSO in sitting Postural control: Right lateral lean Standing balance support: During functional activity;Bilateral upper extremity supported Standing balance-Leahy Scale: Poor Standing balance comment: pt reliant on external supports and mod A x2                             Pertinent Vitals/Pain Pain Assessment: 0-10 Pain Score: 2  Pain Location: back Pain Descriptors / Indicators: Sore Pain Intervention(s): Monitored during session;Repositioned    Home Living Family/patient expects to be discharged to:: Private residence Living Arrangements: Alone Available Help at Discharge: Other (Comment) (pt reported having no family/friends to assist upon d/c) Type of Home: House Home Access: Stairs to enter   [employer] of Steps: 1 Home Layout: One level Home Equipment: Walker - 2 wheels Additional Comments: unsure of accuracy of information provided secondary to pt's arousal level, inconsistent responses and no family/caregiver present to  confirm information    Prior Function Level of Independence: Independent with assistive device(s)         Comments: pt reported that she uses a RW; however, no family/caregivers present to  confirm this information     Hand Dominance        Extremity/Trunk Assessment   Upper Extremity Assessment Upper Extremity Assessment: Defer to OT evaluation    Lower Extremity Assessment Lower Extremity Assessment: Generalized weakness    Cervical / Trunk Assessment Cervical / Trunk Assessment: Other exceptions Cervical / Trunk Exceptions: pt is s/p thoracic and lumbar spine sx  Communication   Communication: No difficulties  Cognition Arousal/Alertness: Lethargic;Suspect due to medications Behavior During Therapy: [facility] for tasks assessed/performed;Flat affect Overall Cognitive Status: Difficult to assess Area of Impairment: Attention;Following commands;Memory;Safety/judgement;Problem solving                   Current Attention Level: Selective Memory: Decreased recall of precautions;Decreased short-term memory Following Commands: Follows one step commands inconsistently;Follows one step commands with increased time Safety/Judgement: Decreased awareness of safety;Decreased awareness of deficits   Problem Solving: Slow processing;Decreased initiation;Difficulty sequencing;Requires verbal cues;Requires tactile cues        General Comments      Exercises     Assessment/Plan    PT Assessment Patient needs continued PT services  PT Problem List Decreased balance;Decreased activity tolerance;Decreased mobility;Decreased coordination;Decreased knowledge of use of DME;Decreased safety awareness;Decreased knowledge of precautions;Pain;Decreased cognition;Decreased range of motion;Decreased strength       PT Treatment Interventions DME instruction;Gait training;Stair training;Functional mobility training;Therapeutic activities;Therapeutic exercise;Balance training;Neuromuscular re-education;Cognitive remediation;Patient/family education    PT Goals (Current goals can be found in the Care Plan section)  Acute Rehab PT Goals Patient Stated Goal: none stated; pt very  lethargic throughout PT Goal Formulation: With patient Time For Goal Achievement: 07/25/16 Potential to Achieve Goals: Good    Frequency Min 5X/week   Barriers to discharge Decreased caregiver support      Co-evaluation PT/OT/SLP Co-Evaluation/Treatment: Yes Reason for Co-Treatment: For patient/therapist safety;To address functional/ADL transfers PT goals addressed during session: Mobility/safety with mobility;Balance;Strengthening/ROM;Proper use of DME         AM-PAC PT "6 Clicks" Daily Activity  Outcome Measure Difficulty turning over in bed (including adjusting bedclothes, sheets and blankets)?: A Little Difficulty moving from lying on back to sitting on the side of the bed? : Total Difficulty sitting down on and standing up from a chair with arms (e.g., wheelchair, bedside commode, etc,.)?: Total Help needed moving to and from a bed to chair (including a wheelchair)?: A Lot Help needed walking in hospital room?: A Lot Help needed climbing 3-5 steps with a railing? : Total 6 Click Score: 10    End of Session Equipment Utilized During Treatment: Gait belt;Back brace Activity Tolerance: Patient limited by lethargy Patient left: in bed;with call bell/phone within reach;with bed alarm set Nurse Communication: Mobility status PT Visit Diagnosis: Other abnormalities of gait and mobility (R26.89);Pain Pain - part of body:  (back)    Time: 8850-2774 PT Time Calculation (min) (ACUTE ONLY): 26 min   Charges:   PT Evaluation $PT Eval Moderate Complexity: 1 Procedure     PT G Codes:        Sherie Don, PT, DPT [place] 07/11/2016, 4:20 PM

## 2016-07-11 NOTE — Progress Notes (Signed)
Heavily sedated Moving legs well Reduce opioids Therapy

## 2016-07-11 NOTE — Progress Notes (Signed)
Patient refusing to get OOB at this time, pt educated and encouraged, still refusing.  Continue to monitor.

## 2016-07-11 NOTE — Progress Notes (Signed)
PT Cancellation Note  Patient Details Name: Karla Foster MRN: [id number] DOB: 05-19-1955   Cancelled Treatment:    Reason Eval/Treat Not Completed: Other (comment). Pt does not have her orthotic in the room with her. Pt reports it is at home and is unsure if anyone can bring it here for her. PT will continue to f/u as available and hopeful that pt will eventually have her brace to allow for full participation in PT evaluation and remain in compliance with MD orders.   Clearnce Sorrel Jonna Dittrich 07/11/2016, 9:26 AM

## 2016-07-12 ENCOUNTER — Encounter (HOSPITAL_COMMUNITY): Payer: Self-pay | Admitting: Neurological Surgery

## 2016-07-12 LAB — CBC
HCT: 24 % — ABNORMAL LOW (ref 36.0–46.0)
Hemoglobin: 7.8 g/dL — ABNORMAL LOW (ref 12.0–15.0)
MCH: 30 pg (ref 26.0–34.0)
MCHC: 32.5 g/dL (ref 30.0–36.0)
MCV: 92.3 fL (ref 78.0–100.0)
Platelets: 236 10*3/uL (ref 150–400)
RBC: 2.6 MIL/uL — ABNORMAL LOW (ref 3.87–5.11)
RDW: 14.6 % (ref 11.5–15.5)
WBC: 23.2 10*3/uL — ABNORMAL HIGH (ref 4.0–10.5)

## 2016-07-12 LAB — BASIC METABOLIC PANEL
Anion gap: 4 — ABNORMAL LOW (ref 5–15)
BUN: <5 mg/dL — ABNORMAL LOW (ref 6–20)
CO2: 29 mmol/L (ref 22–32)
Calcium: 8.6 mg/dL — ABNORMAL LOW (ref 8.9–10.3)
Chloride: 107 mmol/L (ref 101–111)
Creatinine, Ser: 0.54 mg/dL (ref 0.44–1.00)
GFR calc Af Amer: >60 mL/min (ref >60)
GFR calc non Af Amer: >60 mL/min (ref >60)
Glucose, Bld: 119 mg/dL — ABNORMAL HIGH (ref 65–99)
Potassium: 3.8 mmol/L (ref 3.5–5.1)
Sodium: 140 mmol/L (ref 135–145)

## 2016-07-12 NOTE — NC FL2 (Signed)
[place] LEVEL OF CARE SCREENING TOOL     IDENTIFICATION  Patient Name: Karla Foster Birthdate: 05/17/1955 Sex: female Admission Date (Current Location): 07/10/2016  [facility] and [place] Number:  Herbalist and Address:  [facility] [place], [address], [place], [place] [address]      Provider Number: [id number]  Attending Physician Name and Address:  Ditty, Kevan Ny, MD  Relative Name and Phone Number:       Current Level of Care: Hospital Recommended Level of Care: [place] Prior Approval Number:    Date Approved/Denied:   PASRR Number: [id number]  Discharge Plan: SNF    Current Diagnoses: Patient Active Problem List   Diagnosis Date Noted  . Lumbar compression fracture ([place]) 07/10/2016  . Depression   . Essential hypertension 07/16/2012  . Chest pain 07/16/2012  . Unstable angina ([place]) 07-04-2011  . Abnormal EKG - TWI AVL and V2 after admission 07/04/2011  . Family history of coronary artery disease, F-CABG 24's, brother died of MI 79 07/04/11  . Dyslipidemia, she does not take meds 07-04-2011  . Palpitations, X 2wks 04-Jul-2011  . Anxiety Jul 04, 2011  . Smoker 2011/07/04  . Migraine. after she has palpitations 07/04/2011  . BACK PAIN, LUMBAR, CHRONIC 03/22/2009  . LUMBAR RADICULOPATHY, LEFT 06/15/2008    Orientation RESPIRATION BLADDER Height & Weight     Self, Time, Place, Situation  Normal Continent Weight: 178 lb (80.7 kg) Height:  5\' 7"  (170.2 cm)  BEHAVIORAL SYMPTOMS/MOOD NEUROLOGICAL BOWEL NUTRITION STATUS      Continent    AMBULATORY STATUS COMMUNICATION OF NEEDS Skin   Extensive Assist Verbally Skin abrasions                       Personal Care Assistance Level of Assistance  Bathing, Dressing Bathing Assistance: Limited assistance   Dressing Assistance: Limited assistance     Functional Limitations Info             SPECIAL CARE FACTORS FREQUENCY  PT (By  licensed PT), OT (By licensed OT)     PT Frequency: 5x/wk OT Frequency: 5x/wk            Contractures      Additional Factors Info  Code Status, Allergies Code Status Info: full Allergies Info: nka           Current Medications (07/12/2016):  This is the current hospital active medication list Current Facility-Administered Medications  Medication Dose Route Frequency Provider Last Rate Last Dose  . 0.9 %  sodium chloride infusion   Intravenous Continuous Traci Sermon, PA-C 10 mL/hr at 07/11/16 2878    . 0.9 %  sodium chloride infusion  250 mL Intravenous Continuous Costella, Vista Mink, PA-C      . acetaminophen (TYLENOL) tablet 650 mg  650 mg Oral Q4H PRN Costella, Vista Mink, PA-C       Or  . acetaminophen (TYLENOL) suppository 650 mg  650 mg Rectal Q4H PRN Costella, Vista Mink, PA-C      . alum & mag hydroxide-simeth (MAALOX/MYLANTA) 200-200-20 MG/5ML suspension 30 mL  30 mL Oral Q6H PRN Costella, Vista Mink, PA-C      . bisacodyl (DULCOLAX) suppository 10 mg  10 mg Rectal Daily PRN Costella, Vista Mink, PA-C      . celecoxib (CELEBREX) capsule 200 mg  200 mg Oral Q12H Costella, Vincent J, PA-C   200 mg at 07/12/16 0916  . cyclobenzaprine (  FLEXERIL) tablet 10 mg  10 mg Oral TID PRN Traci Sermon, PA-C   10 mg at 07/12/16 0916  . docusate sodium (COLACE) capsule 100 mg  100 mg Oral BID Traci Sermon, PA-C   100 mg at 07/12/16 9532  . gabapentin (NEURONTIN) capsule 300 mg  300 mg Oral TID Traci Sermon, PA-C   300 mg at 07/12/16 0915  . HYDROcodone-acetaminophen (NORCO/VICODIN) 5-325 MG per tablet 1 tablet  1 tablet Oral Q4H PRN Traci Sermon, PA-C   1 tablet at 07/12/16 0916  . HYDROmorphone (DILAUDID) injection 1 mg  1 mg Intravenous Q4H PRN Costella, Vista Mink, PA-C      . menthol-cetylpyridinium (CEPACOL) lozenge 3 mg  1 lozenge Oral PRN Costella, Vista Mink, PA-C       Or  . phenol (CHLORASEPTIC) mouth spray 1 spray  1 spray Mouth/Throat PRN  Costella, Vista Mink, PA-C      . ondansetron (ZOFRAN) tablet 4 mg  4 mg Oral Q6H PRN Costella, Vista Mink, PA-C       Or  . ondansetron (ZOFRAN) injection 4 mg  4 mg Intravenous Q6H PRN Costella, Vincent J, PA-C   4 mg at 07/10/16 2146  . potassium chloride (K-DUR,KLOR-CON) CR tablet 10 mEq  10 mEq Oral BID Traci Sermon, PA-C   10 mEq at 07/12/16 0916  . senna (SENOKOT) tablet 8.6 mg  1 tablet Oral BID Traci Sermon, PA-C   8.6 mg at 07/12/16 0916  . senna-docusate (Senokot-S) tablet 1 tablet  1 tablet Oral QHS PRN Costella, Vincent J, PA-C      . sodium chloride flush (NS) 0.9 % injection 3 mL  3 mL Intravenous Q12H Costella, Vincent J, PA-C      . sodium chloride flush (NS) 0.9 % injection 3 mL  3 mL Intravenous PRN Costella, Vincent J, PA-C      . sodium phosphate (FLEET) 7-19 GM/118ML enema 1 enema  1 enema Rectal Once PRN Costella, Vista Mink, PA-C         Discharge Medications: Please see discharge summary for a list of discharge medications.  Relevant Imaging Results:  Relevant Lab Results:   Additional Information SS#: [id number]  Geralynn Ochs, LCSW

## 2016-07-12 NOTE — Progress Notes (Signed)
No acute events Pain getting better Much more awake and alert today Moving legs well Therapy today Hopefully SNF vs. CIR soon

## 2016-07-12 NOTE — Progress Notes (Signed)
Nursing called and advised CBC Hgb 8.4 yesterday. Probably dilutional. Will repeat today and tomorrow and monitor trend. Trasnfuse <7

## 2016-07-12 NOTE — Progress Notes (Signed)
Patient unwilling to walk or mobilize adequately.  One occurrence in which patient tolerated several minutes sitting edge of bed and stood for a few minutes--c/o dizziness.  Continues to c/o surgical pain with slight headache at times. Advanced to heart healthy diet for breakfast per request with order. Continue to monitor.

## 2016-07-12 NOTE — Progress Notes (Signed)
   07/12/16 1000  Mechanical VTE Prophylaxis (All Areas)  Mechanical VTE Prophylaxis Sequential compression devices, below knee  Mechanical VTE Prophylaxis Intervention On  Pressure Ulcer Prevention  Repositioned Supine;Other (Comment) (sitting on the side of the bed)  Mobility  Activity Stand at bedside;Other (Comment) (sitting on the side of the bed)  Level of Assistance Minimal assist, patient does 75% or more  Assistive Device None  Bed Position Semi-fowlers  Range of Motion Active;All extremities    Attempted to ambulate pt in chair at this time. Pt able to sit up on the side of the bed with minimal assistance and minimal pain but still c/o dizziness when pt starts to stands up. Pt is now back to bed. Will check CBC when MD round. No other distress noted. Will continue to monitor.   Ave Filter, RN

## 2016-07-12 NOTE — Progress Notes (Signed)
Physical Therapy Treatment Patient Details Name: Karla Foster MRN: [id number] DOB: Jul 18, 1955 Today's Date: 07/12/2016    History of Present Illness Pt is a 61 y/o female s/p L1 corpectomy and T10-L3 fusion secondary to L1 burst fx. PMH including but not limited to hyperthyroidism and anxiety.    PT Comments    Pt making slow progress with mobility. She tolerated transfer from bed to chair this session with minimal dizziness; however, pt continues to be limited secondary to lethargy and fatigue. Pt would continue to benefit from skilled physical therapy services at this time while admitted and after d/c to address the below listed limitations in order to improve overall safety and independence with functional mobility.    Follow Up Recommendations  SNF;Supervision/Assistance - 24 hour     Equipment Recommendations  None recommended by PT    Recommendations for Other Services       Precautions / Restrictions Precautions Precautions: Back;Fall Precaution Booklet Issued: Yes (comment) Precaution Comments: PT reviewed 3/3 back precautions with pt throughout session Required Braces or Orthoses: Spinal Brace Spinal Brace: Thoracolumbosacral orthotic;Applied in sitting position Restrictions Weight Bearing Restrictions: No    Mobility  Bed Mobility Overal bed mobility: Needs Assistance Bed Mobility: Rolling;Sidelying to Sit Rolling: Min guard Sidelying to sit: Min assist       General bed mobility comments: increased time, vc'ing for technique, use of bed rails, min A to elevate trunk to achieve sitting EOB   Transfers Overall transfer level: Needs assistance Equipment used: Rolling walker (2 wheeled) Transfers: Sit to/from [facility] Sit to Stand: Mod assist;+2 physical assistance;+2 safety/equipment Stand pivot transfers: Mod assist;+2 physical assistance;+2 safety/equipment       General transfer comment: increased time, vc'ing for bilateral hand  placement and technique, mod A x2 to rise into standing position from bed with vc'ing for improved posture and mod A x2 for stability/safety with pivotal movements from bed to chair  Ambulation/Gait                 Stairs            Wheelchair Mobility    Modified Rankin (Stroke Patients Only)       Balance Overall balance assessment: Needs assistance Sitting-balance support: Feet supported;Single extremity supported Sitting balance-Leahy Scale: Poor Sitting balance - Comments: pt reliant on at least one UE and close min guard; total A to don TLSO in sitting Postural control: Right lateral lean Standing balance support: During functional activity;Bilateral upper extremity supported Standing balance-Leahy Scale: Poor Standing balance comment: pt reliant on bilateral UEs on RW                            Cognition Arousal/Alertness: Lethargic;Suspect due to medications Behavior During Therapy: Flat affect Overall Cognitive Status: Impaired/Different from baseline Area of Impairment: Memory;Following commands;Safety/judgement;Problem solving                     Memory: Decreased recall of precautions;Decreased short-term memory Following Commands: Follows one step commands with increased time Safety/Judgement: Decreased awareness of safety;Decreased awareness of deficits   Problem Solving: Slow processing;Decreased initiation;Difficulty sequencing;Requires verbal cues;Requires tactile cues        Exercises      General Comments        Pertinent Vitals/Pain Pain Assessment: 0-10 Pain Score: 4  Pain Location: back Pain Descriptors / Indicators: Sore Pain Intervention(s): Monitored during session;Repositioned    Home Living  Prior Function            PT Goals (current goals can now be found in the care plan section) Acute Rehab PT Goals PT Goal Formulation: With patient Time For Goal Achievement:  07/25/16 Potential to Achieve Goals: Good Progress towards PT goals: Progressing toward goals    Frequency    Min 5X/week      PT Plan Current plan remains appropriate    Co-evaluation              AM-PAC PT "6 Clicks" Daily Activity  Outcome Measure  Difficulty turning over in bed (including adjusting bedclothes, sheets and blankets)?: A Little Difficulty moving from lying on back to sitting on the side of the bed? : Total Difficulty sitting down on and standing up from a chair with arms (e.g., wheelchair, bedside commode, etc,.)?: Total Help needed moving to and from a bed to chair (including a wheelchair)?: A Lot Help needed walking in hospital room?: A Lot Help needed climbing 3-5 steps with a railing? : Total 6 Click Score: 10    End of Session Equipment Utilized During Treatment: Gait belt;Back brace Activity Tolerance: Patient limited by lethargy;Patient limited by fatigue Patient left: in chair;with call bell/phone within reach;with chair alarm set Nurse Communication: Mobility status PT Visit Diagnosis: Other abnormalities of gait and mobility (R26.89);Pain Pain - part of body:  (back)     Time: 1420-1436 PT Time Calculation (min) (ACUTE ONLY): 16 min  Charges:  $Therapeutic Activity: 8-22 mins                    G Codes:       [place], [place], [place] 07/12/2016, 3:27 PM

## 2016-07-12 NOTE — Progress Notes (Signed)
Patient vitals taken, bed wet, upon getting patient to sitting position, hemovac drain site immediately dripping blood, upon closer assessment, tip of drain out of insertion site.  Patient able to tolerate sitting on edge of bed for approximately 10 minutes and stand at edge of bed for a few minutes, c/o dizziness.  On call provider made aware of situation, gauze dressing applied, continue to monitor site/ patient.

## 2016-07-12 NOTE — Clinical Social Work Note (Signed)
Clinical Social Work Assessment  Patient Details  Name: Karla Foster MRN: [id number] Date of Birth: 09/23/1955  Date of referral:  07/12/16               Reason for consult:  Facility Placement, Discharge Planning                Permission sought to share information with:  Facility Sport and exercise psychologist, Family Supports Permission granted to share information::  Yes, Verbal Permission Granted  Name::     Network engineer::  [facility]  Relationship::  Husband  Contact Information:     Housing/Transportation Living arrangements for the past 2 months:  Apartment Source of Information:  Patient, Spouse Patient Interpreter Needed:  None Criminal Activity/Legal Involvement Pertinent to Current Situation/Hospitalization:  No - Comment as needed Significant Relationships:  Spouse Lives with:  Self, Spouse Do you feel safe going back to the place where you live?  Yes Need for family participation in patient care:  No (Coment)  Care giving concerns:  Pt currently resides at home with spouse, and had been independently caring for her needs prior to a recent fall that led to hospitalization. Pt is concerned about her ability to care for herself upon discharge due to pain and weakness.   Social Worker assessment / plan:  CSW explained recommendation for SNF. Pt explained that she had experience with [facility] before, and would prefer to go back there. Pt refused any other options. CSW indicated that placement at [facility] would depend on insurance authorization and bed availability. CSW provided pt and pt's spouse with list of [place] facilities just in case they would need to identify other options. Pt indicated understanding. CSW contacted [facility] to discuss referral, and confirm that [name] upon discharge. CSW confirmed with pt that [name] available.  Employment status:  Retired Surveyor, [employer] Care PT Recommendations:  [place] / Referral to community resources:  [place]  Patient/Family's Response to care:  Pt is agreeable to SNF placement.  Patient/Family's Understanding of and Emotional Response to Diagnosis, Current Treatment, and Prognosis:  Pt indicated understanding of CSW role in discharge planning. Pt discussed that she was feeling weak and sore from her fall, and is concerned about how independent she can be if she doesn't receive therapy prior to returning home.  Emotional Assessment Appearance:  Appears stated age Attitude/Demeanor/Rapport:    Affect (typically observed):  Appropriate Orientation:  Oriented to Situation, Oriented to  Time, Oriented to Place, Oriented to Self Alcohol / Substance use:  Not Applicable Psych involvement (Current and /or in the community):  No (Comment)  Discharge Needs  Concerns to be addressed:  Care Coordination, Discharge Planning Concerns Readmission within the last 30 days:  No Current discharge risk:  Physical Impairment Barriers to Discharge:  Continued Medical Work up   [employer], [place] 07/12/2016, 2:13 PM

## 2016-07-13 DIAGNOSIS — F332 Major depressive disorder, recurrent severe without psychotic features: Secondary | ICD-10-CM | POA: Diagnosis not present

## 2016-07-13 DIAGNOSIS — E785 Hyperlipidemia, unspecified: Secondary | ICD-10-CM | POA: Diagnosis not present

## 2016-07-13 DIAGNOSIS — F329 Major depressive disorder, single episode, unspecified: Secondary | ICD-10-CM | POA: Diagnosis not present

## 2016-07-13 DIAGNOSIS — R488 Other symbolic dysfunctions: Secondary | ICD-10-CM | POA: Diagnosis not present

## 2016-07-13 DIAGNOSIS — I959 Hypotension, unspecified: Secondary | ICD-10-CM | POA: Diagnosis not present

## 2016-07-13 DIAGNOSIS — S32010D Wedge compression fracture of first lumbar vertebra, subsequent encounter for fracture with routine healing: Secondary | ICD-10-CM | POA: Diagnosis not present

## 2016-07-13 DIAGNOSIS — S12100A Unspecified displaced fracture of second cervical vertebra, initial encounter for closed fracture: Secondary | ICD-10-CM | POA: Diagnosis not present

## 2016-07-13 DIAGNOSIS — D649 Anemia, unspecified: Secondary | ICD-10-CM | POA: Diagnosis not present

## 2016-07-13 DIAGNOSIS — Z4789 Encounter for other orthopedic aftercare: Secondary | ICD-10-CM | POA: Diagnosis not present

## 2016-07-13 DIAGNOSIS — D72829 Elevated white blood cell count, unspecified: Secondary | ICD-10-CM | POA: Diagnosis not present

## 2016-07-13 DIAGNOSIS — M549 Dorsalgia, unspecified: Secondary | ICD-10-CM | POA: Diagnosis not present

## 2016-07-13 DIAGNOSIS — Z981 Arthrodesis status: Secondary | ICD-10-CM | POA: Diagnosis not present

## 2016-07-13 DIAGNOSIS — Z9189 Other specified personal risk factors, not elsewhere classified: Secondary | ICD-10-CM | POA: Diagnosis not present

## 2016-07-13 DIAGNOSIS — R51 Headache: Secondary | ICD-10-CM | POA: Diagnosis not present

## 2016-07-13 DIAGNOSIS — G4700 Insomnia, unspecified: Secondary | ICD-10-CM | POA: Diagnosis not present

## 2016-07-13 DIAGNOSIS — I10 Essential (primary) hypertension: Secondary | ICD-10-CM | POA: Diagnosis not present

## 2016-07-13 DIAGNOSIS — F17210 Nicotine dependence, cigarettes, uncomplicated: Secondary | ICD-10-CM | POA: Diagnosis not present

## 2016-07-13 DIAGNOSIS — E0590 Thyrotoxicosis, unspecified without thyrotoxic crisis or storm: Secondary | ICD-10-CM | POA: Diagnosis not present

## 2016-07-13 DIAGNOSIS — S3992XA Unspecified injury of lower back, initial encounter: Secondary | ICD-10-CM | POA: Diagnosis not present

## 2016-07-13 DIAGNOSIS — M1990 Unspecified osteoarthritis, unspecified site: Secondary | ICD-10-CM | POA: Diagnosis not present

## 2016-07-13 DIAGNOSIS — Z4889 Encounter for other specified surgical aftercare: Secondary | ICD-10-CM | POA: Diagnosis not present

## 2016-07-13 DIAGNOSIS — M6281 Muscle weakness (generalized): Secondary | ICD-10-CM | POA: Diagnosis not present

## 2016-07-13 DIAGNOSIS — K219 Gastro-esophageal reflux disease without esophagitis: Secondary | ICD-10-CM | POA: Diagnosis not present

## 2016-07-13 LAB — BASIC METABOLIC PANEL
Anion gap: 3 — ABNORMAL LOW (ref 5–15)
BUN: 9 mg/dL (ref 6–20)
CO2: 31 mmol/L (ref 22–32)
Calcium: 8.1 mg/dL — ABNORMAL LOW (ref 8.9–10.3)
Chloride: 105 mmol/L (ref 101–111)
Creatinine, Ser: 0.57 mg/dL (ref 0.44–1.00)
GFR calc Af Amer: >60 mL/min (ref >60)
GFR calc non Af Amer: >60 mL/min (ref >60)
Glucose, Bld: 112 mg/dL — ABNORMAL HIGH (ref 65–99)
Potassium: 3.3 mmol/L — ABNORMAL LOW (ref 3.5–5.1)
Sodium: 139 mmol/L (ref 135–145)

## 2016-07-13 LAB — CBC
HCT: 23 % — ABNORMAL LOW (ref 36.0–46.0)
Hemoglobin: 7.6 g/dL — ABNORMAL LOW (ref 12.0–15.0)
MCH: 30.6 pg (ref 26.0–34.0)
MCHC: 33 g/dL (ref 30.0–36.0)
MCV: 92.7 fL (ref 78.0–100.0)
Platelets: 243 10*3/uL (ref 150–400)
RBC: 2.48 MIL/uL — ABNORMAL LOW (ref 3.87–5.11)
RDW: 14.6 % (ref 11.5–15.5)
WBC: 20.7 10*3/uL — ABNORMAL HIGH (ref 4.0–10.5)

## 2016-07-13 MED ORDER — CYCLOBENZAPRINE HCL 10 MG PO TABS: 10.0000 mg | ORAL_TABLET | Freq: Three times a day (TID) | ORAL | 0 refills | Status: DC | PRN

## 2016-07-13 MED ORDER — HYDROCODONE-ACETAMINOPHEN 5-325 MG PO TABS: 1.0000 | ORAL_TABLET | ORAL | 0 refills | Status: DC | PRN

## 2016-07-13 NOTE — Progress Notes (Signed)
Physical Therapy Treatment Patient Details Name: Karla Foster MRN: [id number] DOB: 08-28-1955 Today's Date: 07/13/2016    History of Present Illness Pt is a 61 y/o female s/p L1 corpectomy and T10-L3 fusion secondary to L1 burst fx. PMH including but not limited to hyperthyroidism and anxiety.    PT Comments    Assistance requested to help pt's RN with transferring pt from chair to [facility] and then back to bed. Pt continues to have pain in back and bilateral LEs that is limiting her ability to progress well with mobility. Pt would continue to benefit from skilled physical therapy services at this time while admitted and after d/c to address the below listed limitations in order to improve overall safety and independence with functional mobility.    Follow Up Recommendations  SNF;Supervision/Assistance - 24 hour     Equipment Recommendations  None recommended by PT    Recommendations for Other Services       Precautions / Restrictions Precautions Precautions: Back;Fall Precaution Booklet Issued: Yes (comment) Precaution Comments: PT reviewed 3/3 back precautions with pt throughout session Required Braces or Orthoses: Spinal Brace Spinal Brace: Thoracolumbosacral orthotic;Applied in sitting position Restrictions Weight Bearing Restrictions: No    Mobility  Bed Mobility Overal bed mobility: Needs Assistance Bed Mobility: Rolling;Sit to Sidelying Rolling: Min guard Sidelying to sit: Min assist     Sit to sidelying: Min assist General bed mobility comments: increased time, vc'ing for technique, use of bed rails, min A to return bilateral LEs to bed  Transfers Overall transfer level: Needs assistance Equipment used: Rolling walker (2 wheeled) Transfers: Sit to/from [facility] Sit to Stand: +2 safety/equipment;Mod assist Stand pivot transfers: Mod assist;+2 safety/equipment       General transfer comment: increased time, vc'ing for bilateral hand  placement (although pt continuing to pull up with bilateral UEs on RW) and technique, mod A x2 to rise into standing position from recliner x1 and BSC x1 with vc'ing for improved posture and mod A x2 for stability/safety with pivotal movements  Ambulation/Gait Ambulation/Gait assistance: Min assist Ambulation Distance (Feet): 2 Feet Assistive device: Rolling walker (2 wheeled) Gait Pattern/deviations: Step-to pattern;Step-through pattern;Decreased step length - right;Decreased step length - left;Decreased stride length;Shuffle Gait velocity: decreased Gait velocity interpretation: Below normal speed for age/gender General Gait Details: distance limited secondary to pt reported pain and requesting to sit down   Stairs            Wheelchair Mobility    Modified Rankin (Stroke Patients Only)       Balance Overall balance assessment: Needs assistance Sitting-balance support: Feet supported Sitting balance-Leahy Scale: Fair     Standing balance support: During functional activity;Bilateral upper extremity supported Standing balance-Leahy Scale: Poor Standing balance comment: pt reliant on bilateral UEs on RW                            Cognition Arousal/Alertness: Awake/alert Behavior During Therapy: Flat affect Overall Cognitive Status: Impaired/Different from baseline Area of Impairment: Memory;Following commands;Safety/judgement;Problem solving                     Memory: Decreased recall of precautions;Decreased short-term memory Following Commands: Follows one step commands consistently;Follows multi-step commands inconsistently Safety/Judgement: Decreased awareness of safety;Decreased awareness of deficits   Problem Solving: Difficulty sequencing;Requires verbal cues        Exercises      General Comments  Pertinent Vitals/Pain Pain Assessment: Faces Faces Pain Scale: Hurts even more Pain Location: back Pain Descriptors /  Indicators: Sore Pain Intervention(s): Monitored during session;Repositioned    Home Living                      Prior Function            PT Goals (current goals can now be found in the care plan section) Acute Rehab PT Goals PT Goal Formulation: With patient Time For Goal Achievement: 07/25/16 Potential to Achieve Goals: Good Progress towards PT goals: Progressing toward goals    Frequency    Min 5X/week      PT Plan Current plan remains appropriate    Co-evaluation PT/OT/SLP Co-Evaluation/Treatment: Yes Reason for Co-Treatment: For patient/therapist safety;To address functional/ADL transfers PT goals addressed during session: Mobility/safety with mobility;Balance;Proper use of DME;Strengthening/ROM        AM-PAC PT "6 Clicks" Daily Activity  Outcome Measure  Difficulty turning over in bed (including adjusting bedclothes, sheets and blankets)?: A Little Difficulty moving from lying on back to sitting on the side of the bed? : Total Difficulty sitting down on and standing up from a chair with arms (e.g., wheelchair, bedside commode, etc,.)?: Total Help needed moving to and from a bed to chair (including a wheelchair)?: A Lot Help needed walking in hospital room?: A Lot Help needed climbing 3-5 steps with a railing? : Total 6 Click Score: 10    End of Session Equipment Utilized During Treatment: Gait belt;Back brace Activity Tolerance: Patient limited by pain Patient left: in bed;with call bell/phone within reach;with bed alarm set;with SCD's reapplied Nurse Communication: Mobility status PT Visit Diagnosis: Other abnormalities of gait and mobility (R26.89);Pain Pain - Right/Left:  (bilateral) Pain - part of body: Leg (back)     Time: 1100-1110 PT Time Calculation (min) (ACUTE ONLY): 10 min  Charges:  $Therapeutic Activity: 8-22 mins                    G Codes:       [place], [place], [place] 07/13/2016, 11:21  AM

## 2016-07-13 NOTE — Progress Notes (Signed)
Orthopedic Tech Progress Note Patient Details:  Karla Foster Sep 22, 1955 [id number]  Patient ID: Karla Foster, female   DOB: 12/06/1955, 61 y.o.   MRN: [id number]   [name] 07/13/2016, 10:34 AMCalled [employer] for TLSO brace.

## 2016-07-13 NOTE — Progress Notes (Signed)
Discharge to: [facility] Anticipated discharge date: 07/13/16 Family notified: Yes, husband, by phone Transportation by: [facility]  Report #: [phone]  [place] signing off.  Laveda Abbe LCSW [phone]

## 2016-07-13 NOTE — Progress Notes (Signed)
No issues overnight. Pt has back pain, but appears relatively comfortable. No new complaints.  EXAM:  BP (!) 112/41 (BP Location: Left Arm)   Pulse 90   Temp 98.5 F (36.9 C) (Oral)   Resp 18   Ht 5\' 7"  (1.702 m)   Wt 80.7 kg (178 lb)   SpO2 95%   BMI 27.88 kg/m   Awake, alert, oriented  Speech fluent, appropriate  CN grossly intact  5/5 BUE/BLE   IMPRESSION:  61 y.o. female s/p corpectomy, posterior stabilization. Progressing well. Anemia is likely dilutional/blood loss but no tachycardia, and asymptomatic.  PLAN: - Cont efforts at mobilization - SNF when bed available

## 2016-07-13 NOTE — Care Management Note (Signed)
Case Management Note  Patient Details  Name: Karla Foster MRN: [id number] Date of Birth: 1955-10-01  Subjective/Objective:                    Action/Plan: Pt discharging to [place] today. No further needs per CM.   Expected Discharge Date:  07/13/16               Expected Discharge Plan:  Skilled Nursing Facility  In-House Referral:  Clinical Social Work  Discharge planning Services     Post Acute Care Choice:    Choice offered to:     DME Arranged:    DME Agency:     HH Arranged:    [place] Agency:     Status of Service:  Completed, signed off  If discussed at [employer], dates discussed:    Additional Comments:  Pollie Friar, RN 07/13/2016, 4:15 PM

## 2016-07-13 NOTE — Discharge Summary (Signed)
Physician Discharge Summary  Patient ID: Karla TOLLESON MRN: [id number] DOB/AGE: 1956/02/12 61 y.o.  Admit date: 07/10/2016 Discharge date: 07/13/2016  Admission Diagnoses:  Lumbar Compression Fracture  Discharge Diagnoses:  Same Active Problems:   Lumbar compression fracture [facility])   Discharged Condition: Stable  Hospital Course:  Karla Foster is a 61 y.o. female who was admitted for the below procedure. There were no post operative complications. At time of discharge, pain was well controlled, ambulating with Pt/OT, tolerating po, voiding normal. Ready for discharge. Hgb at discharge was 7.6, likely dilutional. Rec recheck in 1 week.  Treatments: Surgery - L1 corpectomy with expandable cage and T10-L3 posterior fixation and fusion  Discharge Exam: Blood pressure 110/84, pulse 100, temperature 98.8 F (37.1 C), temperature source Oral, resp. rate 18, height 5\' 7"  (1.702 m), weight 80.7 kg (178 lb), SpO2 98 %. Awake, alert, oriented Speech fluent, appropriate CN grossly intact MAEW Wound c/d/i  Disposition: SNF   Discharge Instructions    Call MD for:  difficulty breathing, headache or visual disturbances    Complete by:  As directed    Call MD for:  persistant dizziness or light-headedness    Complete by:  As directed    Call MD for:  redness, tenderness, or signs of infection (pain, swelling, redness, odor or green/yellow discharge around incision site)    Complete by:  As directed    Call MD for:  severe uncontrolled pain    Complete by:  As directed    Call MD for:  temperature >100.4    Complete by:  As directed    Diet general    Complete by:  As directed    Driving Restrictions    Complete by:  As directed    Do not drive until given clearance.   Incentive spirometry RT    Complete by:  As directed    Increase activity slowly    Complete by:  As directed    Lifting restrictions    Complete by:  As directed    Do not lift anything >10lbs. Avoid  bending and twisting in awkward positions. Avoid bending at the back.     Allergies as of 07/13/2016      Reactions   No Known Allergies       Medication List    TAKE these medications   ALEVE 220 MG tablet Generic drug:  naproxen sodium Take 440 mg by mouth daily. Reported on 04/20/2015   clonazePAM 1 MG tablet Commonly known as:  KLONOPIN TAKE 1/2 TABLETS EVERY MORNING, 1 TABLET AT NOON, AND 1 TABLET AT BEDTIME   cyclobenzaprine 10 MG tablet Commonly known as:  FLEXERIL Take 1 tablet (10 mg total) by mouth 3 (three) times daily as needed for muscle spasms.   DULoxetine 60 MG capsule Commonly known as:  CYMBALTA TAKE 1 CAPSULE (60 MG TOTAL) BY MOUTH AT BEDTIME. What changed:  how much to take  how to take this  when to take this  additional instructions   esomeprazole 40 MG capsule Commonly known as:  NEXIUM TAKE 1 CAPSULE (40 MG TOTAL) BY MOUTH DAILY BEFORE BREAKFAST. What changed:  how much to take  how to take this  when to take this  additional instructions   gabapentin 300 MG capsule Commonly known as:  NEURONTIN Take 1 capsule (300 mg total) by mouth 3 (three) times daily.   HYDROcodone-acetaminophen 5-325 MG tablet Commonly known as:  NORCO/VICODIN Take 1 tablet by mouth every 4 (  four) hours as needed for moderate pain.   linaclotide 145 MCG Caps capsule Commonly known as:  LINZESS Take 1 capsule (145 mcg total) by mouth daily before breakfast.   trazodone 300 MG tablet Commonly known as:  DESYREL TAKE 1 TABLET (300 MG TOTAL) BY MOUTH AT BEDTIME.        SignedTraci Sermon 07/13/2016, 3:57 PM

## 2016-07-13 NOTE — Progress Notes (Signed)
Stopped by to visit with patient, providing spiritual/emotional support. Patient is finding it hard to be cheerful. Our visit ended with prayer before she needed help from nurse, after my waiting a few minutes to begin it when she was finishing w/ PT staff -- who assured her she was doing well. But pt was then not sure she could sit up in chair for the 45 min. PT recommended would be helpful. Chaplain available for f/u.   07/13/16 1100  Clinical Encounter Type  Visited With Patient;Health care provider  Visit Type Initial;Psychological support;Spiritual support;Social support  Referral From Chaplain  Spiritual Encounters  Spiritual Needs Prayer;Emotional  Stress Factors  Patient Stress Factors Health changes;Loss of control   Gerrit Heck, Chaplain

## 2016-07-13 NOTE — Care Management Important Message (Signed)
Important Message  Patient Details  Name: Karla Foster MRN: [id number] Date of Birth: 1955-12-14   Medicare Important Message Given:  Yes    Williard Keller 07/13/2016, 1:24 PM

## 2016-07-13 NOTE — Progress Notes (Signed)
Pt discharging at this time PTAR transport to [place] taking all personal belongings. Surgical dressing dry and intact. No complaints of pain or discomfort. Report called to [facility] at facility.

## 2016-07-13 NOTE — Progress Notes (Signed)
Occupational Therapy Treatment Patient Details Name: Karla Foster MRN: [id number] DOB: Aug 19, 1955 Today's Date: 07/13/2016    History of present illness Pt is a 61 y/o female s/p L1 corpectomy and T10-L3 fusion secondary to L1 burst fx. PMH including but not limited to hyperthyroidism and anxiety.   OT comments  Pt making limited progress towards goal this session. Pt able to tolerate transfers to [facility] and recliner. Pt total A for peri care and set up sitting in recliner for other ADL. Pt with NO RECALL of any back precautions at beginning of session and needing verbal cues throughout. Pt continues to require OT in the acute setting and SNF level therapy upon discharge. OT will continue to follow.   Follow Up Recommendations  SNF;Supervision/Assistance - 24 hour    Equipment Recommendations  Other (comment) (defer to next venue)    Recommendations for Other Services      Precautions / Restrictions Precautions Precautions: Back;Fall Precaution Booklet Issued: Yes (comment) Precaution Comments: Pt unable to recall ANY back precautions. OT reviewed 3/3 back precautions with pt throughout session Required Braces or Orthoses: Spinal Brace Spinal Brace: Thoracolumbosacral orthotic;Applied in sitting position Restrictions Weight Bearing Restrictions: No       Mobility Bed Mobility Overal bed mobility: Needs Assistance Bed Mobility: Rolling;Sidelying to Sit Rolling: Min guard Sidelying to sit: Mod assist     Sit to sidelying: Min assist General bed mobility comments: increased time, vc'ing for technique, use of bed rails, min A to elevate trunk to achieve sitting EOB   Transfers Overall transfer level: Needs assistance Equipment used: Rolling walker (2 wheeled) Transfers: Sit to/from [facility] Sit to Stand: Mod assist;+2 physical assistance;+2 safety/equipment Stand pivot transfers: Mod assist;+2 physical assistance;+2 safety/equipment       General  transfer comment: increased time, vc'ing for bilateral hand placement and technique, mod A x2 to rise into standing position from bed with vc'ing for improved posture and mod A x2 for stability/safety with pivotal movements from bed to chair. pt performed sit<> stand from bed x1, from recliner x2 and from [facility] x1    Balance Overall balance assessment: Needs assistance Sitting-balance support: Feet supported Sitting balance-Leahy Scale: Fair     Standing balance support: During functional activity;Bilateral upper extremity supported Standing balance-Leahy Scale: Poor Standing balance comment: pt reliant on bilateral UEs on RW                           ADL either performed or assessed with clinical judgement   ADL Overall ADL's : Needs assistance/impaired     Grooming: Set up;Oral care;Wash/dry face;Wash/dry hands;Sitting Grooming Details (indicate cue type and reason): in recliner         Upper Body Dressing : Maximal assistance;Sitting Upper Body Dressing Details (indicate cue type and reason): to don brace     Toilet Transfer: Moderate assistance;+2 for physical assistance;+2 for safety/equipment;BSC Toilet Transfer Details (indicate cue type and reason): Pt needed vc for safety and direction with the RW Toileting- Clothing Manipulation and Hygiene: Total assistance Toileting - Clothing Manipulation Details (indicate cue type and reason): OT managed hospital gown and peri care as Pt require BUE on RW     Functional mobility during ADLs: Moderate assistance;+2 for physical assistance;+2 for safety/equipment General ADL Comments: Pt motivated by grandchildren - likes to play with them and be active with them     Vision       Perception  Praxis      Cognition Arousal/Alertness: Awake/alert Behavior During Therapy: Flat affect Overall Cognitive Status: Impaired/Different from baseline Area of Impairment: Memory;Following commands;Safety/judgement;Problem  solving                     Memory: Decreased recall of precautions;Decreased short-term memory Following Commands: Follows one step commands consistently;Follows multi-step commands inconsistently Safety/Judgement: Decreased awareness of safety;Decreased awareness of deficits   Problem Solving: Difficulty sequencing;Requires verbal cues General Comments: Pt seems to have general negative outlook on progression post-op        Exercises     Shoulder Instructions       General Comments      Pertinent Vitals/ Pain       Pain Assessment: Faces Faces Pain Scale: Hurts even more Pain Location: back Pain Descriptors / Indicators: Sore Pain Intervention(s): Monitored during session;Repositioned;Premedicated before session  Home Living                                          Prior Functioning/Environment              Frequency  Min 2X/week        Progress Toward Goals  OT Goals(current goals can now be found in the care plan section)  Progress towards OT goals: Progressing toward goals  Acute Rehab OT Goals Patient Stated Goal: to get better to play with grandkids  Plan Discharge plan remains appropriate;Frequency remains appropriate    Co-evaluation    PT/OT/SLP Co-Evaluation/Treatment: Yes Reason for Co-Treatment: For patient/therapist safety;To address functional/ADL transfers PT goals addressed during session: Mobility/safety with mobility;Balance;Proper use of DME;Strengthening/ROM OT goals addressed during session: ADL's and self-care      AM-PAC PT "6 Clicks" Daily Activity     Outcome Measure   Help from another person eating meals?: None Help from another person taking care of personal grooming?: A Lot Help from another person toileting, which includes using toliet, bedpan, or urinal?: A Lot Help from another person bathing (including washing, rinsing, drying)?: A Lot Help from another person to put on and taking off  regular upper body clothing?: Total Help from another person to put on and taking off regular lower body clothing?: Total 6 Click Score: 12    End of Session Equipment Utilized During Treatment: Gait belt;Back brace;Rolling walker  OT Visit Diagnosis: Other abnormalities of gait and mobility (R26.89);Unsteadiness on feet (R26.81);Muscle weakness (generalized) (M62.81);Pain Pain - Right/Left: Right Pain - part of body: Leg (back)   Activity Tolerance Patient limited by lethargy   Patient Left in chair;with call bell/phone within reach;with chair alarm set;Other (comment) (Chaplain present)   Nurse Communication Mobility status;Precautions        Time: 1010-1051 OT Time Calculation (min): 41 min  Charges: OT General Charges $OT Visit: 1 Procedure OT Treatments $Self Care/Home Management : 23-37 mins  Hulda Humphrey OTR/L [place] 07/13/2016, 11:51 AM

## 2016-07-13 NOTE — Progress Notes (Addendum)
Physical Therapy Treatment Patient Details Name: Karla Foster MRN: [id number] DOB: Jul 19, 1955 Today's Date: 07/13/2016    History of Present Illness Pt is a 61 y/o female s/p L1 corpectomy and T10-L3 fusion secondary to L1 burst fx. PMH including but not limited to hyperthyroidism and anxiety.    PT Comments    Pt making minimal progress with mobility, ambulating ~2' with RW and min A for stability; however, very limited secondary to pain and requesting to sit back down. Pt then requesting assistance to transfer to [facility] to void. Pt would continue to benefit from skilled physical therapy services at this time while admitted and after d/c to address the below listed limitations in order to improve overall safety and independence with functional mobility.  BP: After transfer into recliner chair: 107/47 Sitting for 8 mins: 107/64    Follow Up Recommendations  SNF;Supervision/Assistance - 24 hour     Equipment Recommendations  None recommended by PT    Recommendations for Other Services       Precautions / Restrictions Precautions Precautions: Back;Fall Precaution Booklet Issued: Yes (comment) Precaution Comments: PT reviewed 3/3 back precautions with pt throughout session Required Braces or Orthoses: Spinal Brace Spinal Brace: Thoracolumbosacral orthotic;Applied in sitting position Restrictions Weight Bearing Restrictions: No    Mobility  Bed Mobility Overal bed mobility: Needs Assistance Bed Mobility: Rolling;Sidelying to Sit Rolling: Min guard Sidelying to sit: Min assist       General bed mobility comments: increased time, vc'ing for technique, use of bed rails, min A to elevate trunk to achieve sitting EOB   Transfers Overall transfer level: Needs assistance Equipment used: Rolling walker (2 wheeled) Transfers: Sit to/from [facility] Sit to Stand: Mod assist;+2 physical assistance;+2 safety/equipment Stand pivot transfers: Mod assist;+2  physical assistance;+2 safety/equipment       General transfer comment: increased time, vc'ing for bilateral hand placement and technique, mod A x2 to rise into standing position from bed with vc'ing for improved posture and mod A x2 for stability/safety with pivotal movements from bed to chair. pt performed sit<> stand from bed x1, from recliner x2 and from [facility] x1  Ambulation/Gait Ambulation/Gait assistance: Min assist Ambulation Distance (Feet): 2 Feet Assistive device: Rolling walker (2 wheeled) Gait Pattern/deviations: Step-to pattern;Step-through pattern;Decreased step length - right;Decreased step length - left;Decreased stride length;Shuffle Gait velocity: decreased Gait velocity interpretation: Below normal speed for age/gender General Gait Details: distance limited secondary to pt reported pain and requesting to sit down   Stairs            Wheelchair Mobility    Modified Rankin (Stroke Patients Only)       Balance Overall balance assessment: Needs assistance Sitting-balance support: Feet supported Sitting balance-Leahy Scale: Fair     Standing balance support: During functional activity;Bilateral upper extremity supported Standing balance-Leahy Scale: Poor Standing balance comment: pt reliant on bilateral UEs on RW                            Cognition Arousal/Alertness: Awake/alert Behavior During Therapy: Flat affect Overall Cognitive Status: Impaired/Different from baseline Area of Impairment: Memory;Following commands;Safety/judgement;Problem solving                     Memory: Decreased recall of precautions;Decreased short-term memory Following Commands: Follows one step commands consistently;Follows multi-step commands inconsistently Safety/Judgement: Decreased awareness of safety;Decreased awareness of deficits   Problem Solving: Difficulty sequencing;Requires verbal cues  Exercises      General Comments         Pertinent Vitals/Pain Pain Assessment: Faces Faces Pain Scale: Hurts even more Pain Location: back Pain Descriptors / Indicators: Sore Pain Intervention(s): Monitored during session;Repositioned    Home Living                      Prior Function            PT Goals (current goals can now be found in the care plan section) Acute Rehab PT Goals PT Goal Formulation: With patient Time For Goal Achievement: 07/25/16 Potential to Achieve Goals: Good Progress towards PT goals: Progressing toward goals    Frequency    Min 5X/week      PT Plan Current plan remains appropriate    Co-evaluation PT/OT/SLP Co-Evaluation/Treatment: Yes Reason for Co-Treatment: For patient/therapist safety;To address functional/ADL transfers PT goals addressed during session: Mobility/safety with mobility;Balance;Proper use of DME;Strengthening/ROM        AM-PAC PT "6 Clicks" Daily Activity  Outcome Measure  Difficulty turning over in bed (including adjusting bedclothes, sheets and blankets)?: A Little Difficulty moving from lying on back to sitting on the side of the bed? : Total Difficulty sitting down on and standing up from a chair with arms (e.g., wheelchair, bedside commode, etc,.)?: Total Help needed moving to and from a bed to chair (including a wheelchair)?: A Lot Help needed walking in hospital room?: A Lot Help needed climbing 3-5 steps with a railing? : Total 6 Click Score: 10    End of Session Equipment Utilized During Treatment: Gait belt;Back brace Activity Tolerance: Patient limited by pain Patient left: in chair;with call bell/phone within reach;with chair alarm set Nurse Communication: Mobility status PT Visit Diagnosis: Other abnormalities of gait and mobility (R26.89);Pain Pain - part of body:  (back)     Time: 0511-0211 PT Time Calculation (min) (ACUTE ONLY): 30 min  Charges:  $Therapeutic Activity: 8-22 mins                    G Codes:        [place], [place], [place] 07/13/2016, 10:55 AM

## 2016-07-17 ENCOUNTER — Encounter: Payer: Self-pay | Admitting: Internal Medicine

## 2016-07-17 ENCOUNTER — Non-Acute Institutional Stay (SKILLED_NURSING_FACILITY): Payer: PPO | Admitting: Internal Medicine

## 2016-07-17 DIAGNOSIS — Z9189 Other specified personal risk factors, not elsewhere classified: Secondary | ICD-10-CM | POA: Diagnosis not present

## 2016-07-17 DIAGNOSIS — S32010D Wedge compression fracture of first lumbar vertebra, subsequent encounter for fracture with routine healing: Secondary | ICD-10-CM | POA: Diagnosis not present

## 2016-07-17 DIAGNOSIS — D72829 Elevated white blood cell count, unspecified: Secondary | ICD-10-CM | POA: Insufficient documentation

## 2016-07-17 DIAGNOSIS — D649 Anemia, unspecified: Secondary | ICD-10-CM | POA: Insufficient documentation

## 2016-07-17 NOTE — Assessment & Plan Note (Addendum)
As it has been 1 week since surgery, begin wean of narcotics because this significant risk outlined above Depression is significant factor in her slow progress & risk of addiction and adverse drug reaction

## 2016-07-17 NOTE — Progress Notes (Signed)
NURSING HOME LOCATION:  [facility] ROOM NUMBER:  112 CODE STATUS:  DNR  This is a comprehensive admission note to [place] performed on this date less than 30 days from date of admission. Included are preadmission medical/surgical history;reconciled medication list; family history; social history and comprehensive review of systems.  Corrections and additions to the records were documented . Comprehensive physical exam was also performed. Additionally a clinical summary was entered for each active diagnosis pertinent to this admission in the Problem List to enhance continuity of care.  PCP:  Susy Frizzle, MD  [address] [place] [address]   HPI: The patient was hospitalized 5/22-5/25/18 for lumbar compression fracture. L1 corpectomy with expandable cage and T10-L3 posterior fixation and fusion were performed 5/22 by Dr Marland Kitchen Ditty. There were no postoperative combinations but at discharge hemoglobin was 7.6, attributed to dilutional factors. Hemoglobin 11.2/ HCT 33 @ admission.  She was discharged to SNF monitor CBC for PT/OT. Labs reveal potassium 3.3, glucose 112, white count 20,700. White blood count varied from 16,000 to a high of 23,200. Platelet count was normal. DO NOT RESUSCITATE designation has been requested by the patient, this will be discussed.  Past medical and surgical history: Includes history of tuberculosis, hyperthyroidism, palpitations, GERD, essential hypertension, dyslipidemia, and anxiety/depression. Other surgeries include laparoscopic cholecystectomy and partial hysterectomy.  Social history: Patient smokes less than half a pack a day, she does not drink. Risk related to smoking discussed. Patient is not on statin.   Family history: Reviewed, strong family history coronary artery disease.   Review of systems: Review of systems is generally positive. She states that she has diffuse arthralgia pain 3-7 days a week. She was  unaware of having anemia. She states she's had some bleeding of the gums but has no other bleeding dyscrasias. She does have occasional dysphagia and has dyspepsia.   She states her colonoscopy is up-to-date. She is unsure whether she's had upper endoscopy. She's never seen a hematologist in reference to leukocytosis or anemia. She denies definite fever or chills. She has no localizing infectious symptoms. She denies taking steroids. She has intermittent constipation. She also describes an occasional nonproductive cough.  She states her husband  and children left her remotely; this is obviously source of depression.She states she can not afford psychotherapy. Anxiety and depression is also  related to pain, she describes intermittent pain in the left thigh since the surgery.   Constitutional: No fever,significant weight change, fatigue  Eyes: No redness, discharge, pain, vision change ENT/mouth: No nasal congestion,  purulent discharge, earache,change in hearing ,sore throat  Cardiovascular: No chest pain, palpitations,paroxysmal nocturnal dyspnea, claudication, edema  Respiratory: No sputum production,hemoptysis, DOE , significant snoring,apnea  Gastrointestinal: No abdominal pain, nausea / vomiting,rectal bleeding, melena Genitourinary: No dysuria,hematuria, pyuria,  incontinence, nocturia Musculoskeletal: No joint stiffness, joint swelling, weakness Dermatologic: No rash, pruritus, change in appearance of skin Neurologic: No dizziness,headache,syncope, seizures, numbness , tingling Endocrine: No change in hair/skin/ nails, excessive thirst, excessive hunger, excessive urination  Hematologic/lymphatic: No significant bruising, lymphadenopathy Allergy/immunology: No itchy/ watery eyes, significant sneezing, urticaria, angioedema  Physical exam:  Pertinent or positive findings:Eyebrows are thin. She has missing mandibular teeth. She has irregular hyperpigmentation over the right lower lip which  she states is due to smoking. Grade 1 systolic murmur is present. Abdomen is protuberant. Pedal pulses are decreased. She has trace edema at the sock line. She is restless moving around the bed due to the left thigh  pain.   General appearance:Adequately nourished; no acute distress , increased work of breathing is present.   Lymphatic: No lymphadenopathy about the head, neck, axilla . Eyes: No conjunctival inflammation or lid edema is present. There is no scleral icterus. Ears:  External ear exam shows no significant lesions or deformities.   Nose:  External nasal examination shows no deformity or inflammation. Nasal mucosa are pink and moist without lesions ,exudates Oral exam:There is no oropharyngeal erythema or exudate . Neck:  No thyromegaly, masses, tenderness noted.    Heart:  Normal rate and regular rhythm. S1 and S2 normal without gallop,click, rub .  Lungs:Chest clear to auscultation without wheezes, rhonchi,rales , rubs. Abdomen:Bowel sounds are normal. Abdomen is soft and nontender with no organomegaly, hernias,masses. GU: deferred  Extremities:  No cyanosis, clubbing  Neurologic exam : Balance,Rhomberg,finger to nose testing could not be completed due to clinical state Skin: Warm & dry w/o tenting. No significant  rash.  See clinical summary under each active problem in the Problem List with associated updated therapeutic plan

## 2016-07-17 NOTE — Assessment & Plan Note (Addendum)
Monitor CBC Follow-up with PCP as to [place] of such

## 2016-07-17 NOTE — Assessment & Plan Note (Signed)
Wean narcotics and muscle relaxants, risk of falling discussed Continue gabapentin for the radicular pain

## 2016-07-18 ENCOUNTER — Other Ambulatory Visit: Payer: Self-pay | Admitting: *Deleted

## 2016-07-18 LAB — BASIC METABOLIC PANEL
BUN: 11 mg/dL (ref 4–21)
Creatinine: 0.4 mg/dL — AB (ref 0.5–1.1)
Glucose: 94 mg/dL
Potassium: 3.6 mmol/L (ref 3.4–5.3)
SODIUM: 140 mmol/L (ref 137–147)

## 2016-07-18 LAB — CBC AND DIFFERENTIAL
HCT: 25 % — AB (ref 36–46)
HEMOGLOBIN: 8.3 g/dL — AB (ref 12.0–16.0)
Platelets: 561 10*3/uL — AB (ref 150–399)
WBC: 17.1 10*3/mL

## 2016-07-18 MED ORDER — HYDROCODONE-ACETAMINOPHEN 5-325 MG PO TABS: 1.0000 | ORAL_TABLET | ORAL | 0 refills | Status: DC | PRN

## 2016-07-18 NOTE — Telephone Encounter (Signed)
[facility] [phone] Fax: [phone]  

## 2016-07-18 NOTE — Patient Instructions (Signed)
See assessment and plan under each diagnosis in the problem list and acutely for this visit 

## 2016-07-19 ENCOUNTER — Non-Acute Institutional Stay (SKILLED_NURSING_FACILITY): Payer: PPO | Admitting: Internal Medicine

## 2016-07-19 ENCOUNTER — Encounter: Payer: Self-pay | Admitting: Internal Medicine

## 2016-07-19 DIAGNOSIS — D72829 Elevated white blood cell count, unspecified: Secondary | ICD-10-CM

## 2016-07-19 DIAGNOSIS — F332 Major depressive disorder, recurrent severe without psychotic features: Secondary | ICD-10-CM

## 2016-07-19 DIAGNOSIS — I959 Hypotension, unspecified: Secondary | ICD-10-CM

## 2016-07-19 DIAGNOSIS — R51 Headache: Secondary | ICD-10-CM | POA: Diagnosis not present

## 2016-07-19 DIAGNOSIS — G8929 Other chronic pain: Secondary | ICD-10-CM

## 2016-07-19 NOTE — Progress Notes (Signed)
   [facility] Room: 112  PCP: Susy Frizzle, MD [address] [place] [address]   This is a nursing facility follow up for specific acute issue of hypotension.  Interim medical record and care since last [place] visit was updated with review of diagnostic studies and change in clinical status since last visit were documented.  HPI: Patient blood pressure was documented to be as low as 77/43 today. There was no drop with change in posture. The patient is not on antihypertensive medications but she is on multiple sedating medications including clonazepam, cyclobenzaprine, gabapentin, oxycodone, and trazodone. Dr Dennard Schaumann sent me Epic message stating she has had leukocytosis previously ;he plans OP evaluation after  She is discharged to R/O CLL .  Review of systems: She does describe some weakness and dizziness earlier this morning. She has occasional dysphagia and frequent dyspepsia. She has easy bruising but no other bleeding dyscrasias. She is on a nonsteroidal for chronic daily headaches. She states she has headaches 6-7 times per week. In addition to nonsteroidals she'll have at least one cup of coffee a day and 4 glasses of tea or cola daily.  Constitutional: No fever,significant weight change  Eyes: No redness, discharge, pain, vision change ENT/mouth: No nasal congestion,  purulent discharge, earache,change in hearing ,sore throat  Cardiovascular: No chest pain, palpitations,paroxysmal nocturnal dyspnea, claudication, edema  Respiratory: No cough, sputum production,hemoptysis, DOE , significant snoring,apnea   Gastrointestinal: No abdominal pain, nausea / vomiting,rectal bleeding, melena,change in bowels Genitourinary: No dysuria,hematuria, pyuria,  incontinence, nocturia Dermatologic: No rash, pruritus, change in appearance of skin Neurologic: No syncope, seizures, numbness , tingling Psychiatric: No significant anxiety , depression,  insomnia, anorexia Endocrine: No change in hair/skin/ nails, excessive thirst, excessive hunger, excessive urination  Hematologic/lymphatic: No significant lymphadenopathy,abnormal bleeding Allergy/immunology: No itchy/ watery eyes, significant sneezing, urticaria, angioedema  Physical exam:  Pertinent or positive findings: she does appear pale. Initially she was sleeping but could be aroused easily. She did not exhibit apnea or snoring. Grade 1/2 -1 systolic murmur is present. She has occasional premature beat. Pedal pulses are intact. Irregular hyperpigmentation over lower lip.  General appearance:Adequately nourished; no acute distress , increased work of breathing is present.   Lymphatic: No lymphadenopathy about the head, neck, axilla . Eyes: No conjunctival inflammation or lid edema is present. There is no scleral icterus. Ears:  External ear exam shows no significant lesions or deformities.   Nose:  External nasal examination shows no deformity or inflammation. Nasal mucosa are pink and moist without lesions ,exudates Oral exam: lips and gums are healthy appearing.There is no oropharyngeal erythema or exudate . Neck:  No thyromegaly, masses, tenderness noted.    Heart:  Normal rate and regular rhythm. S1 and S2 normal without gallop, click, rub .  Lungs:Chest clear to auscultation without wheezes, rhonchi,rales , rubs. Abdomen:Bowel sounds are normal. Abdomen is soft and nontender with no organomegaly, hernias,masses. GU: deferred  Extremities:  No cyanosis, clubbing,edema  Neurologic exam : Cn 2-7 intact Strength equal  in upper & lower extremities Balance,Rhomberg,finger to nose testing could not be completed due to clinical state Deep tendon reflexes are equal Skin: Warm & dry w/o tenting. No significant rash.  See summary under each active problem in the Problem List with associated updated therapeutic plan

## 2016-07-19 NOTE — Assessment & Plan Note (Addendum)
Check CBC & BMET (ordered 07/18/16) D/C Flexeril

## 2016-07-19 NOTE — Patient Instructions (Signed)
See assessment and plan under diagnosis acutely for this visit  

## 2016-07-20 DIAGNOSIS — Z4889 Encounter for other specified surgical aftercare: Secondary | ICD-10-CM | POA: Diagnosis not present

## 2016-07-20 DIAGNOSIS — F17210 Nicotine dependence, cigarettes, uncomplicated: Secondary | ICD-10-CM | POA: Diagnosis not present

## 2016-07-20 DIAGNOSIS — Z4789 Encounter for other orthopedic aftercare: Secondary | ICD-10-CM | POA: Diagnosis not present

## 2016-07-20 DIAGNOSIS — K219 Gastro-esophageal reflux disease without esophagitis: Secondary | ICD-10-CM | POA: Diagnosis not present

## 2016-07-20 DIAGNOSIS — E785 Hyperlipidemia, unspecified: Secondary | ICD-10-CM | POA: Diagnosis not present

## 2016-07-20 DIAGNOSIS — E0590 Thyrotoxicosis, unspecified without thyrotoxic crisis or storm: Secondary | ICD-10-CM | POA: Diagnosis not present

## 2016-07-20 DIAGNOSIS — M1990 Unspecified osteoarthritis, unspecified site: Secondary | ICD-10-CM | POA: Diagnosis not present

## 2016-07-20 DIAGNOSIS — F329 Major depressive disorder, single episode, unspecified: Secondary | ICD-10-CM | POA: Diagnosis not present

## 2016-07-20 DIAGNOSIS — G4700 Insomnia, unspecified: Secondary | ICD-10-CM | POA: Diagnosis not present

## 2016-07-20 DIAGNOSIS — I10 Essential (primary) hypertension: Secondary | ICD-10-CM | POA: Diagnosis not present

## 2016-07-20 DIAGNOSIS — M6281 Muscle weakness (generalized): Secondary | ICD-10-CM | POA: Diagnosis not present

## 2016-07-20 DIAGNOSIS — R488 Other symbolic dysfunctions: Secondary | ICD-10-CM | POA: Diagnosis not present

## 2016-07-20 DIAGNOSIS — Z981 Arthrodesis status: Secondary | ICD-10-CM | POA: Diagnosis not present

## 2016-07-20 NOTE — Assessment & Plan Note (Signed)
Aletha Halim ,Psych NP will be asked to see her

## 2016-07-20 NOTE — Assessment & Plan Note (Signed)
Dr Dennard Schaumann plans OP evaluation to R/O CLL Repeat CBC & dif ordered for 5/30 still pending

## 2016-07-24 ENCOUNTER — Non-Acute Institutional Stay (SKILLED_NURSING_FACILITY): Payer: PPO | Admitting: Internal Medicine

## 2016-07-24 ENCOUNTER — Encounter: Payer: Self-pay | Admitting: Internal Medicine

## 2016-07-24 DIAGNOSIS — D72829 Elevated white blood cell count, unspecified: Secondary | ICD-10-CM

## 2016-07-24 DIAGNOSIS — J440 Chronic obstructive pulmonary disease with acute lower respiratory infection: Secondary | ICD-10-CM | POA: Diagnosis not present

## 2016-07-24 DIAGNOSIS — I959 Hypotension, unspecified: Secondary | ICD-10-CM

## 2016-07-24 DIAGNOSIS — J209 Acute bronchitis, unspecified: Secondary | ICD-10-CM | POA: Diagnosis not present

## 2016-07-24 LAB — BASIC METABOLIC PANEL
BUN: 12 mg/dL (ref 4–21)
Creatinine: 0.6 mg/dL (ref 0.5–1.1)
Glucose: 89 mg/dL
Potassium: 4.3 mmol/L (ref 3.4–5.3)
Sodium: 135 mmol/L — AB (ref 137–147)

## 2016-07-24 LAB — CBC AND DIFFERENTIAL
HEMATOCRIT: 32 % — AB (ref 36–46)
Hemoglobin: 10.3 g/dL — AB (ref 12.0–16.0)
Neutrophils Absolute: 10 /uL
PLATELETS: 579 10*3/uL — AB (ref 150–399)
WBC: 12.5 10^3/mL

## 2016-07-24 NOTE — Assessment & Plan Note (Signed)
Repeat CBC

## 2016-07-24 NOTE — Progress Notes (Signed)
   NURSING HOME LOCATION:  [facility] ROOM NUMBER:  112-A  CODE STATUS:  Full Code  PCP:  Susy Frizzle, MD  [address] [place] [address]   Interim medical record and care since last [place] visit was updated with review of diagnostic studies and change in clinical status since last visit were documented.  HPI: This is a nursing facility follow up for specific acute issue of cough associated with substernal chest pain. She was documented to have O2 sats in the mid to high 80% ;she states she's had dyspnea intermittently for one month. The cough has been present for 4 days. She states that the symptoms are associated with wheezing  Also hypotension has been documented, there has been some postural component at times.Additionally the patient has experienced near syncope on more than one occasion while in physical therapy ambulating.  Medications have been adjusted because of the hypotension. Apparently she has had no pain medication for 3 days.  Review of systems: She also has some intermittent dysphagia. She denies other cardiopulmonary symptoms. She has no bleeding dyscrasias.  Constitutional: No fever,significant weight change, fatigue  Eyes: No redness, discharge, pain, vision change ENT/mouth: No nasal congestion,  purulent discharge, earache,change in hearing ,sore throat  Cardiovascular: No non pleuritic chest pain, palpitations,paroxysmal nocturnal dyspnea, claudication, edema  Respiratory: No sputum production,hemoptysis,significant snoring,apnea  Gastrointestinal: No heartburn,abdominal pain, nausea / vomiting,rectal bleeding, melena,change in bowels Genitourinary: No dysuria,hematuria, pyuria,  incontinence, nocturia Musculoskeletal: No joint stiffness, joint swelling, weakness Dermatologic: No rash, pruritus, change in appearance of skin Neurologic: No dizziness,headache,frank syncope Endocrine: No change in hair/skin/ nails, excessive thirst,  excessive hunger, excessive urination  Hematologic/lymphatic: No significant bruising, lymphadenopathy,abnormal bleeding Allergy/immunology: No itchy/ watery eyes, significant sneezing, urticaria, angioedema  Physical exam:  Pertinent or positive findings: The patient appears lethargic. She has missing mandibular teeth anteriorly. She has irregular hyperpigmentation over the right lower lip. Grade 1 systolic murmur is present. Heart rhythm is slightly irregular. She has harsh rales bilaterally, greater on the left. She has trace edema. Pedal pulses are decreased. General appearance:Adequately nourished; no acute distress , increased work of breathing is present.   Lymphatic: No lymphadenopathy about the head, neck, axilla . Eyes: No conjunctival inflammation or lid edema is present. There is no scleral icterus. Ears:  External ear exam shows no significant lesions or deformities.   Nose:  External nasal examination shows no deformity or inflammation. Nasal mucosa are pink and moist without lesions ,exudates Oral exam: lips and gums are healthy appearing.There is no oropharyngeal erythema or exudate . Neck:  No thyromegaly, masses, tenderness noted.    Heart:  No gallop, click, rub .  Abdomen:Bowel sounds are normal. Abdomen is soft and nontender with no organomegaly, hernias,masses. GU: deferred  Extremities:  No cyanosis, clubbing  Skin: Warm & dry w/o tenting. No significant lesions or rash.  See summary under each active problem in the Visit Diagnosis List with associated updated therapeutic plan

## 2016-07-24 NOTE — Patient Instructions (Addendum)
See assessment and plan under each diagnosis acutely for this visit  

## 2016-07-25 DIAGNOSIS — F419 Anxiety disorder, unspecified: Secondary | ICD-10-CM | POA: Diagnosis not present

## 2016-07-25 DIAGNOSIS — F339 Major depressive disorder, recurrent, unspecified: Secondary | ICD-10-CM | POA: Diagnosis not present

## 2016-07-25 DIAGNOSIS — G4700 Insomnia, unspecified: Secondary | ICD-10-CM | POA: Diagnosis not present

## 2016-07-26 ENCOUNTER — Other Ambulatory Visit: Payer: Self-pay

## 2016-07-26 ENCOUNTER — Encounter: Payer: Self-pay | Admitting: Internal Medicine

## 2016-07-26 ENCOUNTER — Non-Acute Institutional Stay (SKILLED_NURSING_FACILITY): Payer: PPO | Admitting: Internal Medicine

## 2016-07-26 DIAGNOSIS — R0902 Hypoxemia: Secondary | ICD-10-CM

## 2016-07-26 DIAGNOSIS — J209 Acute bronchitis, unspecified: Secondary | ICD-10-CM

## 2016-07-26 DIAGNOSIS — I959 Hypotension, unspecified: Secondary | ICD-10-CM | POA: Diagnosis not present

## 2016-07-26 DIAGNOSIS — J440 Chronic obstructive pulmonary disease with acute lower respiratory infection: Secondary | ICD-10-CM

## 2016-07-26 DIAGNOSIS — J449 Chronic obstructive pulmonary disease, unspecified: Secondary | ICD-10-CM | POA: Insufficient documentation

## 2016-07-26 MED ORDER — CLONAZEPAM 1 MG PO TABS: ORAL_TABLET | ORAL | 0 refills | Status: DC

## 2016-07-26 NOTE — Telephone Encounter (Signed)
Faxed to [facility] Fax Number: [phone], Phone Number [phone]  

## 2016-07-26 NOTE — Patient Instructions (Signed)
See assessment and plan under each diagnosis in the problem list and acutely for this visit 

## 2016-07-26 NOTE — Assessment & Plan Note (Signed)
Continue present pulmonary toilet, oral steroids,& IM antibiotics

## 2016-07-26 NOTE — Progress Notes (Signed)
   NURSING HOME LOCATION:  [facility] ROOM NUMBER:  104-A  CODE STATUS:  Full code  PCP:  Susy Frizzle, MD  [address] [place] [address]   This is a nursing facility follow up for specific acute issue of bronchitis and abnormal chest x-ray.  Interim medical record and care since last [place] visit was updated with review of diagnostic studies and change in clinical status since last visit were documented.  HPI: The patient was seen 07/24/16 for cough productive of yellow material associated with significant oxygen desaturation with exertion. She was placed on Rocephin and a burst of steroids. Chest x-ray suggested early pulmonary edema and possible early pneumonia on the left. Films were personally reviewed. Furosemide was ordered for the possible early pulmonary edema. White count had dropped from a recent high of 23,200 to 12,500. Her significant anemia had improved from hemoglobin 7.6 to 10.3.  Review of systems: She states that she is significantly improved but still fatigued. Cough is nonproductive for the most part. Constitutional: No fever,significant weight change, fatigue  Eyes: No redness, discharge, pain, vision change ENT/mouth: No nasal congestion,  purulent discharge, earache,change in hearing ,sore throat  Cardiovascular: No chest pain, palpitations,paroxysmal nocturnal dyspnea, claudication, edema  Respiratory: No hemoptysis Gastrointestinal: No heartburn,dysphagia,abdominal pain, nausea / vomiting,rectal bleeding, melena,change in bowels Genitourinary: No dysuria,hematuria, pyuria,  incontinence, nocturia Musculoskeletal: No joint stiffness, joint swelling, weakness Dermatologic: No rash, pruritus, change in appearance of skin Hematologic/lymphatic: No significant bruising, lymphadenopathy,abnormal bleeding Allergy/immunology: No itchy/ watery eyes, significant sneezing, urticaria, angioedema  Physical exam:  Pertinent or positive  findings: Her hypotension has resolved as noted above. Additionally her O2 sats have improved significantly. The irregular hyperpigmented lesions over the right lip appeared to be drier and less dark. Slightly irregular rhythm and grade 1/2 systolic murmurs present. She has intermittent rhonchi on the left, especially anteriorly. This did improve with deep breathing and coughing. Pedal pulses are decreased. General appearance:Adequately nourished; no acute distress , increased work of breathing is present.   Lymphatic: No lymphadenopathy about the head, neck, axilla . Eyes: No conjunctival inflammation or lid edema is present. There is no scleral icterus. Ears:  External ear exam shows no significant lesions or deformities.   Nose:  External nasal examination shows no deformity or inflammation. Nasal mucosa are pink and moist without lesions ,exudates Oral exam: There is no oropharyngeal erythema or exudate . Neck:  No thyromegaly, masses, tenderness noted.    Heart:  No gallop, click, rub .  Abdomen:Bowel sounds are normal. Abdomen is soft and nontender with no organomegaly, hernias,masses. GU: deferred  Extremities:  No cyanosis, clubbing,edema  Skin: Warm & dry w/o tenting. No significant lesions or rash.  #1 bronchitis, rule out early pneumonia #2 radiographic vascular accentuation, rule out early pulmonary edema #3 hypotension, resolved #4 hypoxemia, resolved #5 leukocytosis and anemia, improving Plan: Continue pulmonary toilet and parenteral antibiotics.

## 2016-07-27 ENCOUNTER — Other Ambulatory Visit: Payer: Self-pay

## 2016-07-27 MED ORDER — CLONAZEPAM 1 MG PO TABS: ORAL_TABLET | ORAL | 5 refills | Status: DC

## 2016-07-27 NOTE — Telephone Encounter (Signed)
Faxed to [facility] Fax Number: [phone], Phone Number [phone]  

## 2016-07-30 MED ORDER — CLONAZEPAM 1 MG PO TABS: ORAL_TABLET | ORAL | 5 refills | Status: DC

## 2016-07-30 NOTE — Addendum Note (Signed)
Addended by: Logan Bores on: 07/30/2016 11:45 AM   Modules accepted: Orders

## 2016-07-30 NOTE — Telephone Encounter (Signed)
Reprinted under Dr.Reed's name for sig

## 2016-07-31 ENCOUNTER — Other Ambulatory Visit: Payer: Self-pay | Admitting: *Deleted

## 2016-07-31 MED ORDER — CLONAZEPAM 1 MG PO TABS: ORAL_TABLET | ORAL | 0 refills | Status: DC

## 2016-07-31 NOTE — Telephone Encounter (Signed)
[facility] [phone] Fax: [phone]  

## 2016-08-01 LAB — CBC AND DIFFERENTIAL
HCT: 34 — AB (ref 36–46)
Hemoglobin: 11.1 — AB (ref 12.0–16.0)
Neutrophils Absolute: 9
PLATELETS: 630 — AB (ref 150–399)
WBC: 14.7

## 2016-08-03 ENCOUNTER — Encounter: Payer: Self-pay | Admitting: Adult Health

## 2016-08-03 ENCOUNTER — Non-Acute Institutional Stay (SKILLED_NURSING_FACILITY): Payer: PPO | Admitting: Adult Health

## 2016-08-03 DIAGNOSIS — J189 Pneumonia, unspecified organism: Secondary | ICD-10-CM | POA: Diagnosis not present

## 2016-08-03 DIAGNOSIS — D72829 Elevated white blood cell count, unspecified: Secondary | ICD-10-CM

## 2016-08-03 DIAGNOSIS — W19XXXS Unspecified fall, sequela: Secondary | ICD-10-CM
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
## 2016-08-05 NOTE — Op Note (Signed)
07/10/2016  12:49 PM  PATIENT:  Karla Foster  61 y.o. female  PRE-OPERATIVE DIAGNOSIS:  L1 compression fracture with failed healing and kyphotic deformity  POST-OPERATIVE DIAGNOSIS:  Same  PROCEDURE:  Anterior/Lateral approach for L1 corpectomy with expandable cage; T10-L3 posterior fixation with posterolateral arthrodesis.  SURGEON:  Aldean Ast, MD  ASSISTANTS: Earnie Larsson, MD  ANESTHESIA:   General  DRAINS: Medium Hemovac   SPECIMEN:  None  INDICATION FOR PROCEDURE: 61 year old woman with intractable pain from an L1 burst fracture.  It has collapsed over time and she is unable to sit upright because of pain. I recommended the above operation. Patient understood the risks, benefits, and alternatives and potential outcomes and wished to proceed.  PROCEDURE DETAILS: The patient was brought to the operating room.  After smooth induction of general endotracheal anesthesia the patient was placed in the lateral position with the right side up and secured with tape.  Localizing views were taken with fluoroscopy.  The operative site was then prepped and draped in the usual sterile fashion.  The planned incisions were infiltrated with lidocaine with epinephrine.   The skin was sharply incised and soft tissue to the level of the oblique abdominal muscles was carried out with monopolar cautery.  I encountered a rib.  I dissected it free of the surrounding soft tissues and then separated it from the underlying soft tissues with curettes and rib scrapers.  I was careful to preserve the neurovascular bundle.  I resected the rib.  I remained extrapleural and placed the dilator over the L1 vertebra.  I then placed the second dilator and the retractor.  I sharply opened the diaphragm vertically and mobilized the soft tissues off the L1 vertebral body.  At L1-2 I incised the disc space and then passed a Cobb elevator along each endplate and penetrated the annulus on the contralateral side.  I  then used box cutters, a paddle, and a rasp to complete the discectomy and prepare the endplates.  This was then repeated at T12-L1.  I then used a Leksell rongeur, osteotomes, and a high speed drill to complete adequate corpectomy to allow placement of the expandable cage.  I used templates to determine the appropriate footprint.  The expandable cage was then constructed with footplates to span the entire vertebra.  It was packed with morsellized allograft.  It was inserted into the defect and expanded until I felt resistance.  It appeared to be well positioned by fluoro.  I packed morsellized allograft in the space around the cage.    I obtained hemostasis and removed the retractor.  The incisions was closed in layers with interrupted PDS and vicryl sutures.  The skin was closed with running monocryl stratafix sutures and dermabond.  The patient was then turned supine on a stretcher.  She was turned prone on an open Roscoe table.  The skin of the thoracolumbar area was cleaned with alcohol.  I injected lidocaine and marcaine with epinephrine in the subcutaneous tissues at the site of the planned incision.  The patient was prepped and draped in the usual sterile fashion.  I made a midline incision.  Soft tissues were dissected in the midline until we reached the spinous processes.  Subperiosteal dissection was performed from T10 to L3, exposing the transverse processes and facet joints at each level.    The MazorX was registered to the patient with fluoroscopy.  It was then used as a drill guide to cannulate pedicle trajectories bilaterally  at T10, T11, L2, and L3.  K-wires were placed at each level.  I then tapped over the K-wire and palpated with a balltip probe to ensure competency.  I then placed pedicle screws bilaterally at each level.  I contoured titanium rods and laid them within the tulip heads before securing them with set screws.  These were then finally tightened.  I irrigated vigorously  with bacitracin saline.  I decorticated the laminae and transverse processes of T10-L3.  I placed morselized allograft and BMP over the decorticated surfaces.    I placed a medium hemovac drain below the fascia.  Vancomycin powder was placed in the depths of the wound.  The wound was closed in routine anatomic layers with running stratafix suture.  The skin was closed with a running monocryl and dermabond.  A dressing was applied.   PATIENT DISPOSITION:  PACU, stable   Delay start of Pharmacological VTE agent (>24hrs) due to surgical blood loss or risk of bleeding:  Yes

## 2016-08-09 ENCOUNTER — Encounter: Payer: Self-pay | Admitting: Adult Health

## 2016-08-09 ENCOUNTER — Non-Acute Institutional Stay (SKILLED_NURSING_FACILITY): Payer: PPO | Admitting: Adult Health

## 2016-08-09 DIAGNOSIS — F419 Anxiety disorder, unspecified: Secondary | ICD-10-CM

## 2016-08-09 DIAGNOSIS — D649 Anemia, unspecified: Secondary | ICD-10-CM

## 2016-08-09 DIAGNOSIS — K219 Gastro-esophageal reflux disease without esophagitis: Secondary | ICD-10-CM | POA: Diagnosis not present

## 2016-08-09 DIAGNOSIS — R2681 Unsteadiness on feet: Secondary | ICD-10-CM

## 2016-08-09 DIAGNOSIS — B0229 Other postherpetic nervous system involvement: Secondary | ICD-10-CM | POA: Diagnosis not present

## 2016-08-09 DIAGNOSIS — F332 Major depressive disorder, recurrent severe without psychotic features: Secondary | ICD-10-CM

## 2016-08-09 DIAGNOSIS — G629 Polyneuropathy, unspecified: Secondary | ICD-10-CM | POA: Diagnosis not present

## 2016-08-09 DIAGNOSIS — S32010D Wedge compression fracture of first lumbar vertebra, subsequent encounter for fracture with routine healing: Secondary | ICD-10-CM

## 2016-08-09 DIAGNOSIS — G4700 Insomnia, unspecified: Secondary | ICD-10-CM

## 2016-08-09 MED ORDER — GABAPENTIN 300 MG PO CAPS: 300.0000 mg | ORAL_CAPSULE | Freq: Three times a day (TID) | ORAL | 0 refills | Status: DC

## 2016-08-09 MED ORDER — TRAZODONE HCL 300 MG PO TABS: 300.0000 mg | ORAL_TABLET | Freq: Every day | ORAL | 0 refills | Status: DC

## 2016-08-09 MED ORDER — ESOMEPRAZOLE MAGNESIUM 40 MG PO CPDR: DELAYED_RELEASE_CAPSULE | ORAL | 0 refills | Status: DC

## 2016-08-09 MED ORDER — DULOXETINE HCL 60 MG PO CPEP: ORAL_CAPSULE | ORAL | 0 refills | Status: DC
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
## 2016-08-11 DIAGNOSIS — F339 Major depressive disorder, recurrent, unspecified: Secondary | ICD-10-CM | POA: Diagnosis not present

## 2016-08-11 DIAGNOSIS — M6281 Muscle weakness (generalized): Secondary | ICD-10-CM | POA: Diagnosis not present

## 2016-08-11 DIAGNOSIS — Z961 Presence of intraocular lens: Secondary | ICD-10-CM | POA: Diagnosis not present

## 2016-08-11 DIAGNOSIS — Z4789 Encounter for other orthopedic aftercare: Secondary | ICD-10-CM | POA: Diagnosis not present

## 2016-08-11 DIAGNOSIS — G529 Cranial nerve disorder, unspecified: Secondary | ICD-10-CM | POA: Diagnosis not present

## 2016-08-11 DIAGNOSIS — I10 Essential (primary) hypertension: Secondary | ICD-10-CM | POA: Diagnosis not present

## 2016-08-11 DIAGNOSIS — R2689 Other abnormalities of gait and mobility: Secondary | ICD-10-CM | POA: Diagnosis not present

## 2016-08-11 DIAGNOSIS — F419 Anxiety disorder, unspecified: Secondary | ICD-10-CM | POA: Diagnosis not present

## 2016-08-11 DIAGNOSIS — M1990 Unspecified osteoarthritis, unspecified site: Secondary | ICD-10-CM | POA: Diagnosis not present

## 2016-08-14 DIAGNOSIS — Z961 Presence of intraocular lens: Secondary | ICD-10-CM | POA: Diagnosis not present

## 2016-08-14 DIAGNOSIS — R2689 Other abnormalities of gait and mobility: Secondary | ICD-10-CM | POA: Diagnosis not present

## 2016-08-14 DIAGNOSIS — M1990 Unspecified osteoarthritis, unspecified site: Secondary | ICD-10-CM | POA: Diagnosis not present

## 2016-08-14 DIAGNOSIS — M6281 Muscle weakness (generalized): Secondary | ICD-10-CM | POA: Diagnosis not present

## 2016-08-15 ENCOUNTER — Telehealth: Payer: Self-pay | Admitting: *Deleted

## 2016-08-15 NOTE — Telephone Encounter (Signed)
Meredith with [facility] phoned today to let us know that pt is not able to afford the copay for physical therapy that she has been getting. She has to pay a $25 dollar copay each time they come out so was unable to continue this therapy.

## 2016-08-17 NOTE — Telephone Encounter (Signed)
Please let pt Neurosurgeon know this Info- Dr. Cyndy Freeze They sent her for therapy after her surgery

## 2016-08-20 NOTE — Telephone Encounter (Signed)
Called and LMOVM with Dr. Hewitt Shorts CMA about the message below

## 2016-08-21 ENCOUNTER — Encounter: Payer: Self-pay | Admitting: Family Medicine

## 2016-08-21 ENCOUNTER — Ambulatory Visit (INDEPENDENT_AMBULATORY_CARE_PROVIDER_SITE_OTHER): Payer: PPO | Admitting: Family Medicine

## 2016-08-21 VITALS — BP 110/60 | HR 74 | Temp 98.6°F | Resp 16 | Ht 67.0 in | Wt 172.0 lb

## 2016-08-21 DIAGNOSIS — D72829 Elevated white blood cell count, unspecified: Secondary | ICD-10-CM

## 2016-08-21 DIAGNOSIS — Z4789 Encounter for other orthopedic aftercare: Secondary | ICD-10-CM | POA: Diagnosis not present

## 2016-08-21 DIAGNOSIS — Z09 Encounter for follow-up examination after completed treatment for conditions other than malignant neoplasm: Secondary | ICD-10-CM | POA: Diagnosis not present

## 2016-08-21 DIAGNOSIS — M6281 Muscle weakness (generalized): Secondary | ICD-10-CM | POA: Diagnosis not present
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
## 2016-08-22 LAB — CBC WITH DIFFERENTIAL/PLATELET
BASOS ABS: 0 {cells}/uL (ref 0–200)
BASOS PCT: 0 %
EOS ABS: 141 {cells}/uL (ref 15–500)
Eosinophils Relative: 1 %
HEMATOCRIT: 38.2 % (ref 35.0–45.0)
Hemoglobin: 12.2 g/dL (ref 12.0–15.0)
LYMPHS PCT: 25 %
Lymphs Abs: 3525 cells/uL (ref 850–3900)
MCH: 29.5 pg (ref 27.0–33.0)
MCHC: 31.9 g/dL — ABNORMAL LOW (ref 32.0–36.0)
MCV: 92.3 fL (ref 80.0–100.0)
MONO ABS: 705 {cells}/uL (ref 200–950)
MONOS PCT: 5 %
MPV: 10 fL (ref 7.5–12.5)
Neutro Abs: 9729 cells/uL — ABNORMAL HIGH (ref 1500–7800)
Neutrophils Relative %: 69 %
Platelets: 551 10*3/uL — ABNORMAL HIGH (ref 140–400)
RBC: 4.14 MIL/uL (ref 3.80–5.10)
RDW: 14.9 % (ref 11.0–15.0)
WBC: 14.1 10*3/uL — ABNORMAL HIGH (ref 3.8–10.8)

## 2016-08-22 LAB — COMPLETE METABOLIC PANEL WITH GFR
ALT: 5 U/L — AB (ref 6–29)
AST: 16 U/L (ref 10–35)
Albumin: 3.7 g/dL (ref 3.6–5.1)
Alkaline Phosphatase: 190 U/L — ABNORMAL HIGH (ref 33–130)
BILIRUBIN TOTAL: 0.3 mg/dL (ref 0.2–1.2)
BUN: 9 mg/dL (ref 7–25)
CALCIUM: 9.5 mg/dL (ref 8.6–10.4)
CO2: 24 mmol/L (ref 20–31)
CREATININE: 0.68 mg/dL (ref 0.50–0.99)
Chloride: 103 mmol/L (ref 98–110)
Glucose, Bld: 118 mg/dL — ABNORMAL HIGH (ref 70–99)
Potassium: 4.7 mmol/L (ref 3.5–5.3)
Sodium: 138 mmol/L (ref 135–146)
TOTAL PROTEIN: 6.7 g/dL (ref 6.1–8.1)

## 2016-08-29 ENCOUNTER — Other Ambulatory Visit: Payer: Self-pay | Admitting: Family Medicine

## 2016-08-29 DIAGNOSIS — D72829 Elevated white blood cell count, unspecified: Secondary | ICD-10-CM

## 2016-09-07 ENCOUNTER — Other Ambulatory Visit: Payer: Self-pay | Admitting: Adult Health

## 2016-09-07 DIAGNOSIS — B0229 Other postherpetic nervous system involvement: Secondary | ICD-10-CM

## 2016-09-10 ENCOUNTER — Other Ambulatory Visit: Payer: Self-pay | Admitting: Family Medicine

## 2016-09-10 ENCOUNTER — Telehealth: Payer: Self-pay | Admitting: Family Medicine

## 2016-09-10 MED ORDER — CLONAZEPAM 1 MG PO TABS: ORAL_TABLET | ORAL | 0 refills | Status: DC

## 2016-09-10 NOTE — Telephone Encounter (Signed)
ok 

## 2016-09-10 NOTE — Telephone Encounter (Signed)
Medication called/sent to requested pharmacy  

## 2016-09-10 NOTE — Telephone Encounter (Signed)
Pt called requesting a refill on her [place] to refill??      LRF 08/09/16

## 2016-09-11 ENCOUNTER — Telehealth: Payer: Self-pay | Admitting: Family Medicine

## 2016-09-11 ENCOUNTER — Other Ambulatory Visit: Payer: Self-pay | Admitting: Adult Health

## 2016-09-11 MED ORDER — CLONAZEPAM 1 MG PO TABS: ORAL_TABLET | ORAL | 0 refills | Status: DC

## 2016-09-11 NOTE — Telephone Encounter (Signed)
Med was called into wrong pharm - med recalled into correct pharm [facility] aware to cxd rx there. Pt is aware.

## 2016-09-11 NOTE — Telephone Encounter (Signed)
[facility]  Patient says he klonopin keeps getting denied  [phone]

## 2016-09-12 ENCOUNTER — Encounter: Payer: Self-pay | Admitting: Oncology

## 2016-09-12 ENCOUNTER — Telehealth: Payer: Self-pay | Admitting: Oncology

## 2016-09-12 NOTE — Telephone Encounter (Signed)
Appt has been scheduled for the pt to see Dr. Alen Blew on 8/17 at 11am. Pt aware to arrive 30 minutes early. Address and insurance verified. Letter mailed.

## 2016-09-28 ENCOUNTER — Other Ambulatory Visit: Payer: Self-pay | Admitting: Adult Health

## 2016-09-29 ENCOUNTER — Other Ambulatory Visit: Payer: Self-pay | Admitting: Adult Health

## 2016-09-29 DIAGNOSIS — B0229 Other postherpetic nervous system involvement: Secondary | ICD-10-CM

## 2016-10-04 DIAGNOSIS — S32001K Stable burst fracture of unspecified lumbar vertebra, subsequent encounter for fracture with nonunion: Secondary | ICD-10-CM | POA: Diagnosis not present

## 2016-10-05 ENCOUNTER — Ambulatory Visit (HOSPITAL_BASED_OUTPATIENT_CLINIC_OR_DEPARTMENT_OTHER): Payer: PPO | Admitting: Oncology

## 2016-10-05 ENCOUNTER — Telehealth: Payer: Self-pay | Admitting: Oncology

## 2016-10-05 VITALS — BP 136/67 | HR 100 | Temp 98.5°F | Resp 17 | Ht 67.0 in | Wt 168.2 lb

## 2016-10-05 DIAGNOSIS — D72829 Elevated white blood cell count, unspecified: Secondary | ICD-10-CM | POA: Diagnosis not present

## 2016-10-05 DIAGNOSIS — Z720 Tobacco use: Secondary | ICD-10-CM | POA: Diagnosis not present

## 2016-10-05 DIAGNOSIS — E039 Hypothyroidism, unspecified: Secondary | ICD-10-CM | POA: Diagnosis not present
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
## 2016-10-05 NOTE — Telephone Encounter (Signed)
No los per 8/17. °

## 2016-10-10 ENCOUNTER — Other Ambulatory Visit: Payer: Self-pay | Admitting: Adult Health

## 2016-10-10 DIAGNOSIS — B0229 Other postherpetic nervous system involvement: Secondary | ICD-10-CM

## 2016-10-12 ENCOUNTER — Other Ambulatory Visit: Payer: Self-pay | Admitting: Family Medicine

## 2016-10-12 DIAGNOSIS — B0229 Other postherpetic nervous system involvement: Secondary | ICD-10-CM

## 2016-10-12 MED ORDER — ESOMEPRAZOLE MAGNESIUM 40 MG PO CPDR: DELAYED_RELEASE_CAPSULE | ORAL | 3 refills | Status: DC

## 2016-10-12 MED ORDER — GABAPENTIN 300 MG PO CAPS: 300.0000 mg | ORAL_CAPSULE | Freq: Three times a day (TID) | ORAL | 3 refills | Status: DC

## 2016-10-14 ENCOUNTER — Other Ambulatory Visit: Payer: Self-pay | Admitting: Family Medicine

## 2016-10-15 NOTE — Telephone Encounter (Signed)
Ok to refill 

## 2016-10-15 NOTE — Telephone Encounter (Signed)
ok 

## 2016-11-19 ENCOUNTER — Other Ambulatory Visit: Payer: Self-pay | Admitting: Family Medicine

## 2016-11-19 NOTE — Telephone Encounter (Signed)
Last OV 08/21/2016 Last refill 8/27 Ok to refill?

## 2016-11-19 NOTE — Telephone Encounter (Signed)
ok 

## 2016-11-19 NOTE — Telephone Encounter (Signed)
rx called into pharmacy

## 2016-12-20 ENCOUNTER — Other Ambulatory Visit: Payer: Self-pay | Admitting: Family Medicine

## 2016-12-21 NOTE — Telephone Encounter (Signed)
ok 

## 2016-12-21 NOTE — Telephone Encounter (Signed)
Medication called to pharmacy. 

## 2016-12-21 NOTE — Telephone Encounter (Signed)
Ok to refill 

## 2017-01-24 ENCOUNTER — Other Ambulatory Visit: Payer: Self-pay | Admitting: Family Medicine

## 2017-01-24 NOTE — Telephone Encounter (Signed)
ok 

## 2017-01-24 NOTE — Telephone Encounter (Signed)
Medication called to pharmacy. 

## 2017-01-24 NOTE — Telephone Encounter (Signed)
Ok to refill 

## 2017-02-06 ENCOUNTER — Other Ambulatory Visit: Payer: Self-pay | Admitting: Family Medicine

## 2017-02-26 ENCOUNTER — Other Ambulatory Visit: Payer: Self-pay | Admitting: Family Medicine

## 2017-02-26 NOTE — Telephone Encounter (Signed)
Ok to refill??  Last office visit 08/21/2016.  Last refill 01/24/2017.

## 2017-03-08 ENCOUNTER — Ambulatory Visit (INDEPENDENT_AMBULATORY_CARE_PROVIDER_SITE_OTHER): Payer: Medicare Other | Admitting: Family Medicine

## 2017-03-08 ENCOUNTER — Encounter: Payer: Self-pay | Admitting: Family Medicine

## 2017-03-08 VITALS — BP 132/72 | HR 88 | Temp 97.9°F | Resp 20 | Ht 67.0 in | Wt 170.0 lb

## 2017-03-08 DIAGNOSIS — R0602 Shortness of breath: Secondary | ICD-10-CM
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
## 2017-03-09 LAB — COMPLETE METABOLIC PANEL WITH GFR
AG RATIO: 1.2 (calc) (ref 1.0–2.5)
ALT: 5 U/L — AB (ref 6–29)
AST: 16 U/L (ref 10–35)
Albumin: 3.7 g/dL (ref 3.6–5.1)
Alkaline phosphatase (APISO): 114 U/L (ref 33–130)
BILIRUBIN TOTAL: 0.4 mg/dL (ref 0.2–1.2)
BUN: 13 mg/dL (ref 7–25)
CHLORIDE: 102 mmol/L (ref 98–110)
CO2: 26 mmol/L (ref 20–32)
Calcium: 9.4 mg/dL (ref 8.6–10.4)
Creat: 0.66 mg/dL (ref 0.50–0.99)
GFR, Est African American: 110 mL/min/{1.73_m2} (ref >=60)
GFR, Est Non African American: 95 mL/min/{1.73_m2} (ref >=60)
Globulin: 3.1 g/dL (calc) (ref 1.9–3.7)
Glucose, Bld: 106 mg/dL — ABNORMAL HIGH (ref 65–99)
POTASSIUM: 4.2 mmol/L (ref 3.5–5.3)
Sodium: 141 mmol/L (ref 135–146)
Total Protein: 6.8 g/dL (ref 6.1–8.1)

## 2017-03-09 LAB — CBC WITH DIFFERENTIAL/PLATELET
BASOS ABS: 119 {cells}/uL (ref 0–200)
Basophils Relative: 0.6 %
EOS ABS: 40 {cells}/uL (ref 15–500)
Eosinophils Relative: 0.2 %
HCT: 40 % (ref 35.0–45.0)
Hemoglobin: 13.4 g/dL (ref 11.7–15.5)
Lymphs Abs: 2229 cells/uL (ref 850–3900)
MCH: 29.8 pg (ref 27.0–33.0)
MCHC: 33.5 g/dL (ref 32.0–36.0)
MCV: 89.1 fL (ref 80.0–100.0)
MPV: 11.9 fL (ref 7.5–12.5)
Monocytes Relative: 6.3 %
NEUTROS PCT: 81.7 %
Neutro Abs: 16258 cells/uL — ABNORMAL HIGH (ref 1500–7800)
PLATELETS: 421 10*3/uL — AB (ref 140–400)
RBC: 4.49 10*6/uL (ref 3.80–5.10)
RDW: 13.6 % (ref 11.0–15.0)
TOTAL LYMPHOCYTE: 11.2 %
WBC: 19.9 10*3/uL — AB (ref 3.8–10.8)
WBCMIX: 1254 {cells}/uL — AB (ref 200–950)

## 2017-03-09 LAB — BRAIN NATRIURETIC PEPTIDE: Brain Natriuretic Peptide: 181 pg/mL — ABNORMAL HIGH (ref <100)

## 2017-03-09 LAB — D-DIMER, QUANTITATIVE: D-Dimer, Quant: 0.98 mcg/mL FEU — ABNORMAL HIGH (ref <0.50)

## 2017-03-11 ENCOUNTER — Other Ambulatory Visit: Payer: Self-pay | Admitting: Family Medicine

## 2017-03-11 DIAGNOSIS — R7989 Other specified abnormal findings of blood chemistry: Secondary | ICD-10-CM

## 2017-03-11 NOTE — Progress Notes (Unsigned)
ct 

## 2017-03-13 ENCOUNTER — Other Ambulatory Visit: Payer: Self-pay | Admitting: Family Medicine

## 2017-03-13 DIAGNOSIS — R7989 Other specified abnormal findings of blood chemistry: Secondary | ICD-10-CM

## 2017-03-14 DIAGNOSIS — R7989 Other specified abnormal findings of blood chemistry: Secondary | ICD-10-CM | POA: Diagnosis not present

## 2017-03-25 ENCOUNTER — Encounter: Payer: Self-pay | Admitting: Family Medicine

## 2017-03-25 ENCOUNTER — Ambulatory Visit (INDEPENDENT_AMBULATORY_CARE_PROVIDER_SITE_OTHER): Payer: Medicare Other | Admitting: Family Medicine

## 2017-03-25 VITALS — BP 134/76 | HR 70 | Temp 98.4°F | Resp 18 | Ht 67.0 in | Wt 168.0 lb

## 2017-03-25 DIAGNOSIS — L299 Pruritus, unspecified: Secondary | ICD-10-CM | POA: Diagnosis not present

## 2017-03-25 MED ORDER — HYDROXYZINE HCL 25 MG PO TABS: 25.0000 mg | ORAL_TABLET | Freq: Four times a day (QID) | ORAL | 0 refills | Status: DC | PRN

## 2017-03-25 MED ORDER — PERMETHRIN 5 % EX CREA: 1.0000 "application " | TOPICAL_CREAM | Freq: Once | CUTANEOUS | 0 refills | Status: AC
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
## 2017-04-04 ENCOUNTER — Other Ambulatory Visit: Payer: Self-pay | Admitting: Family Medicine

## 2017-04-04 NOTE — Telephone Encounter (Signed)
Ok to refill??  Last office visit 03/25/2017.  Last refill 02/26/2017.

## 2017-05-16 ENCOUNTER — Other Ambulatory Visit: Payer: Self-pay | Admitting: Family Medicine

## 2017-05-16 NOTE — Telephone Encounter (Signed)
Ok to refill??  Last office visit/ refill 04/04/2017.

## 2017-06-17 ENCOUNTER — Other Ambulatory Visit: Payer: Self-pay | Admitting: Family Medicine

## 2017-06-17 NOTE — Telephone Encounter (Signed)
Ok to refill??  Last office visit 03/25/2017.  Last refill 05/20/2017.

## 2017-07-23 ENCOUNTER — Other Ambulatory Visit: Payer: Self-pay | Admitting: Family Medicine

## 2017-07-23 DIAGNOSIS — B0229 Other postherpetic nervous system involvement: Secondary | ICD-10-CM

## 2017-07-23 NOTE — Telephone Encounter (Signed)
Requesting refill    Klonopin  LOV: 03/25/17  LRF:  06/18/17

## 2017-08-19 ENCOUNTER — Encounter: Payer: Self-pay | Admitting: Family Medicine

## 2017-08-19 ENCOUNTER — Ambulatory Visit (INDEPENDENT_AMBULATORY_CARE_PROVIDER_SITE_OTHER): Payer: Medicare Other | Admitting: Family Medicine

## 2017-08-19 DIAGNOSIS — F325 Major depressive disorder, single episode, in full remission: Secondary | ICD-10-CM | POA: Diagnosis not present

## 2017-08-19 DIAGNOSIS — K219 Gastro-esophageal reflux disease without esophagitis: Secondary | ICD-10-CM | POA: Diagnosis not present

## 2017-08-19 DIAGNOSIS — D72829 Elevated white blood cell count, unspecified: Secondary | ICD-10-CM

## 2017-08-19 DIAGNOSIS — F419 Anxiety disorder, unspecified: Secondary | ICD-10-CM

## 2017-08-19 DIAGNOSIS — F17200 Nicotine dependence, unspecified, uncomplicated: Secondary | ICD-10-CM | POA: Diagnosis not present

## 2017-08-19 DIAGNOSIS — J449 Chronic obstructive pulmonary disease, unspecified: Secondary | ICD-10-CM

## 2017-08-19 DIAGNOSIS — S32010D Wedge compression fracture of first lumbar vertebra, subsequent encounter for fracture with routine healing: Secondary | ICD-10-CM

## 2017-08-19 DIAGNOSIS — M549 Dorsalgia, unspecified: Secondary | ICD-10-CM

## 2017-08-19 DIAGNOSIS — G8929 Other chronic pain: Secondary | ICD-10-CM | POA: Insufficient documentation
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
## 2017-08-19 NOTE — Assessment & Plan Note (Signed)
Stable.  Continue gabapentin 300 mg 3 times daily.

## 2017-08-19 NOTE — Patient Instructions (Addendum)
It was very nice to see you today!  No medication changes today.  Please let me know when you need a refill on your medications.   Please come back in about 6 months for your physical with blood work.  Please come back to see me sooner if anything else comes up.  Take care, Dr [name]

## 2017-08-19 NOTE — Assessment & Plan Note (Signed)
Stable. Continue nexium 40mg  daily.

## 2017-08-19 NOTE — Assessment & Plan Note (Signed)
Stable today without signs of acute flare.  Discussed smoking cessation.

## 2017-08-19 NOTE — Assessment & Plan Note (Signed)
Patient was asked about her tobacco use today and was strongly advised to quit. Patient is currently contemplative. We reviewed treatment options to assist her quit smoking including NRT, Chantix, and Bupropion.  She is interested in quitting, however does not want to start any medications today.  Follow up next office visit.   Total time spent counseling approximately 3 minutes.

## 2017-08-19 NOTE — Assessment & Plan Note (Signed)
WBC 19.9 on last CBC about 6 months ago. She has seen heme/onc in the past who thought this was a secondary leukocytosis.  They recommended a yearly screening CBC.

## 2017-08-19 NOTE — Assessment & Plan Note (Signed)
Stable.  Continue current doses of Cymbalta and Klonopin.  Database reviewed without red flags.

## 2017-08-19 NOTE — Assessment & Plan Note (Signed)
Stable.  Continue Cymbalta 60 mg daily and current dose of Klonopin.  Database was reviewed without red flags.

## 2017-08-23 ENCOUNTER — Other Ambulatory Visit: Payer: Self-pay | Admitting: Family Medicine

## 2017-08-26 ENCOUNTER — Other Ambulatory Visit: Payer: Self-pay | Admitting: Family Medicine

## 2017-08-26 MED ORDER — CLONAZEPAM 1 MG PO TABS: ORAL_TABLET | ORAL | 2 refills | Status: DC

## 2017-08-26 NOTE — Addendum Note (Signed)
Addended by: Elmer Bales on: 08/26/2017 02:27 PM   Modules accepted: Orders

## 2017-08-26 NOTE — Telephone Encounter (Signed)
Please advise 

## 2017-08-26 NOTE — Telephone Encounter (Signed)
Refill of Klonopin by historical provider  LOV 08/19/17 Dr. [name]  [facility] 07/24/17  #60  0 refills  [id number] [place], [place] at [address]  Phone: [phone] Fax: [phone]

## 2017-08-26 NOTE — Telephone Encounter (Signed)
Copied from [place] [phone]. Topic: Quick Communication - See Telephone Encounter >> Aug 26, 2017 10:14 AM Conception Chancy, NT wrote: CRM for notification. See Telephone encounter for: 08/26/17.  Patient is calling and requesting a refill on clonazePAM (KLONOPIN) 1 MG tablet. Please advise.  [id number] - [place], [place] - [address] [place] [address] Phone: [phone] Fax: [phone]

## 2017-10-10 ENCOUNTER — Other Ambulatory Visit: Payer: Self-pay | Admitting: Family Medicine

## 2017-10-22 ENCOUNTER — Ambulatory Visit: Payer: Self-pay | Admitting: Family Medicine

## 2017-10-22 ENCOUNTER — Inpatient Hospital Stay (HOSPITAL_COMMUNITY): Payer: Medicare Other

## 2017-10-22 ENCOUNTER — Inpatient Hospital Stay (HOSPITAL_COMMUNITY)
Admission: EM | Admit: 2017-10-22 | Discharge: 2017-10-28 | DRG: 372 | Disposition: A | Discharge status: Home / Self Care | Payer: Medicare Other | Attending: Internal Medicine | Admitting: Internal Medicine

## 2017-10-22 ENCOUNTER — Encounter (HOSPITAL_COMMUNITY): Payer: Self-pay | Admitting: Emergency Medicine

## 2017-10-22 DIAGNOSIS — E875 Hyperkalemia: Secondary | ICD-10-CM

## 2017-10-22 DIAGNOSIS — A0472 Enterocolitis due to Clostridium difficile, not specified as recurrent: Principal | ICD-10-CM | POA: Diagnosis present

## 2017-10-22 DIAGNOSIS — N179 Acute kidney failure, unspecified: Secondary | ICD-10-CM | POA: Diagnosis not present

## 2017-10-22 DIAGNOSIS — I959 Hypotension, unspecified: Secondary | ICD-10-CM | POA: Diagnosis present

## 2017-10-22 DIAGNOSIS — F17200 Nicotine dependence, unspecified, uncomplicated: Secondary | ICD-10-CM | POA: Diagnosis present

## 2017-10-22 DIAGNOSIS — E860 Dehydration: Secondary | ICD-10-CM | POA: Diagnosis present

## 2017-10-22 DIAGNOSIS — R197 Diarrhea, unspecified: Secondary | ICD-10-CM | POA: Diagnosis not present

## 2017-10-22 DIAGNOSIS — R1110 Vomiting, unspecified: Secondary | ICD-10-CM | POA: Diagnosis not present

## 2017-10-22 DIAGNOSIS — F419 Anxiety disorder, unspecified: Secondary | ICD-10-CM | POA: Diagnosis present

## 2017-10-22 DIAGNOSIS — F329 Major depressive disorder, single episode, unspecified: Secondary | ICD-10-CM | POA: Diagnosis present

## 2017-10-22 DIAGNOSIS — Z6822 Body mass index (BMI) 22.0-22.9, adult: Secondary | ICD-10-CM

## 2017-10-22 DIAGNOSIS — R0602 Shortness of breath: Secondary | ICD-10-CM | POA: Diagnosis not present

## 2017-10-22 DIAGNOSIS — E440 Moderate protein-calorie malnutrition: Secondary | ICD-10-CM

## 2017-10-22 DIAGNOSIS — I447 Left bundle-branch block, unspecified: Secondary | ICD-10-CM | POA: Diagnosis not present

## 2017-10-22 DIAGNOSIS — E876 Hypokalemia: Secondary | ICD-10-CM | POA: Diagnosis not present

## 2017-10-22 DIAGNOSIS — Z66 Do not resuscitate: Secondary | ICD-10-CM | POA: Diagnosis present

## 2017-10-22 DIAGNOSIS — A419 Sepsis, unspecified organism: Secondary | ICD-10-CM | POA: Diagnosis not present

## 2017-10-22 DIAGNOSIS — I129 Hypertensive chronic kidney disease with stage 1 through stage 4 chronic kidney disease, or unspecified chronic kidney disease: Secondary | ICD-10-CM | POA: Diagnosis present

## 2017-10-22 DIAGNOSIS — Z23 Encounter for immunization: Secondary | ICD-10-CM | POA: Diagnosis not present

## 2017-10-22 LAB — GASTROINTESTINAL PANEL BY PCR, STOOL (REPLACES STOOL CULTURE)

## 2017-10-22 LAB — C DIFFICILE QUICK SCREEN W PCR REFLEX
C Diff antigen: POSITIVE — AB
C Diff interpretation: DETECTED
C Diff toxin: POSITIVE — AB

## 2017-10-22 LAB — COMPREHENSIVE METABOLIC PANEL WITH GFR
ALT: 14 U/L (ref 0–44)
AST: 23 U/L (ref 15–41)
Albumin: 3.1 g/dL — ABNORMAL LOW (ref 3.5–5.0)
Alkaline Phosphatase: 152 U/L — ABNORMAL HIGH (ref 38–126)
Anion gap: 12 (ref 5–15)
BUN: 11 mg/dL (ref 8–23)
CO2: 22 mmol/L (ref 22–32)
Calcium: 8.9 mg/dL (ref 8.9–10.3)
Chloride: 102 mmol/L (ref 98–111)
Creatinine, Ser: 1.24 mg/dL — ABNORMAL HIGH (ref 0.44–1.00)
GFR calc Af Amer: 53 mL/min — ABNORMAL LOW
GFR calc non Af Amer: 46 mL/min — ABNORMAL LOW
Glucose, Bld: 159 mg/dL — ABNORMAL HIGH (ref 70–99)
Potassium: 2.6 mmol/L — CL (ref 3.5–5.1)
Sodium: 136 mmol/L (ref 135–145)
Total Bilirubin: 0.8 mg/dL (ref 0.3–1.2)
Total Protein: 6.6 g/dL (ref 6.5–8.1)

## 2017-10-22 LAB — CBC
HCT: 44.7 % (ref 36.0–46.0)
Hemoglobin: 14.5 g/dL (ref 12.0–15.0)
MCH: 30.2 pg (ref 26.0–34.0)
MCHC: 32.4 g/dL (ref 30.0–36.0)
MCV: 93.1 fL (ref 78.0–100.0)
Platelets: 272 K/uL (ref 150–400)
RBC: 4.8 MIL/uL (ref 3.87–5.11)
RDW: 14.3 % (ref 11.5–15.5)
WBC: 32.9 K/uL — ABNORMAL HIGH (ref 4.0–10.5)

## 2017-10-22 LAB — DIFFERENTIAL
Abs Immature Granulocytes: 0.4 10*3/uL — ABNORMAL HIGH (ref 0.0–0.1)
Basophils Absolute: 0.1 10*3/uL (ref 0.0–0.1)
Basophils Relative: 0 %
Eosinophils Absolute: 0.1 10*3/uL (ref 0.0–0.7)
Eosinophils Relative: 0 %
Immature Granulocytes: 1 %
Lymphocytes Relative: 7 %
Lymphs Abs: 2.2 10*3/uL (ref 0.7–4.0)
Monocytes Absolute: 2.6 10*3/uL — ABNORMAL HIGH (ref 0.1–1.0)
Monocytes Relative: 8 %
Neutro Abs: 27.9 10*3/uL — ABNORMAL HIGH (ref 1.7–7.7)
Neutrophils Relative %: 84 %

## 2017-10-22 LAB — LIPASE, BLOOD: Lipase: 22 U/L (ref 11–51)

## 2017-10-22 LAB — I-STAT CG4 LACTIC ACID, ED: Lactic Acid, Venous: 1.45 mmol/L (ref 0.5–1.9)

## 2017-10-22 LAB — MAGNESIUM: MAGNESIUM: 1.6 mg/dL — AB (ref 1.7–2.4)

## 2017-10-22 IMAGING — CT CT ABD-PELV W/ CM
2 of 5 series · 16 of 46 positions shown, 18 images · IV contrast (APPLIED)
Comparison: [DATE]

CLINICAL DATA: Generalized weakness, diarrhea, and vomiting for 2
weeks.

EXAM:
CT ABDOMEN AND PELVIS WITH CONTRAST
TECHNIQUE: Multidetector CT imaging of the abdomen and pelvis was performed
using the standard protocol following bolus administration of
intravenous contrast.
CONTRAST:  80mL [EA] IOPAMIDOL ([EA]) INJECTION 61%

[Series 3: abdomen 5.0 · axial · 0.77mm/px · z∈[+782,+1222]mm · 13 of 102 slices shown, 15 images]
[im 7/102  soft-tissue]
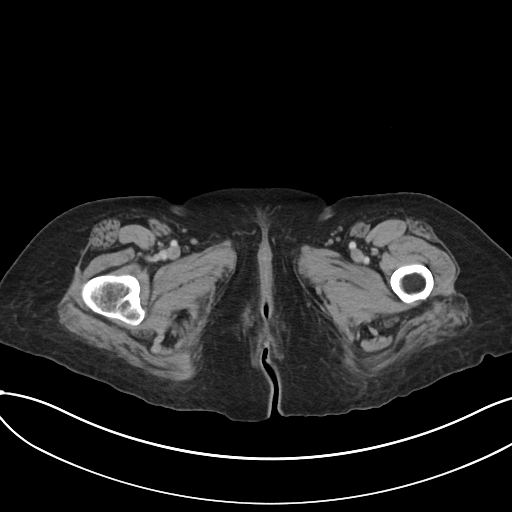
[im 7/102  bone]
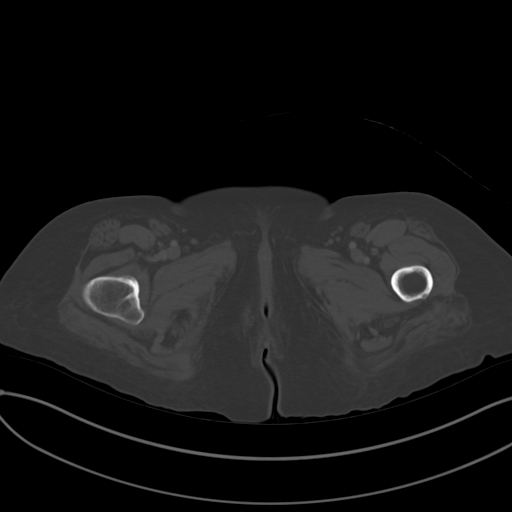
[im 14/102  soft-tissue]
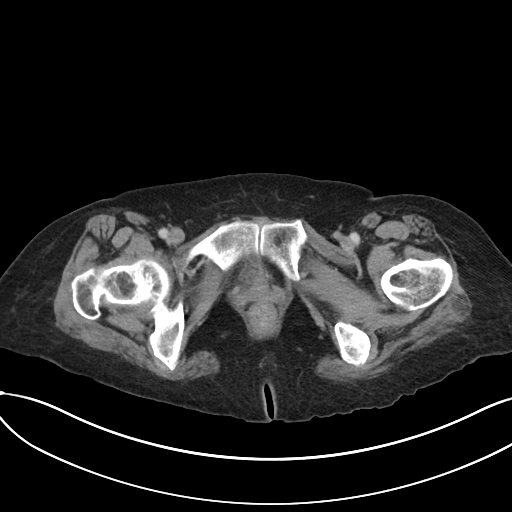
[im 21/102  soft-tissue]
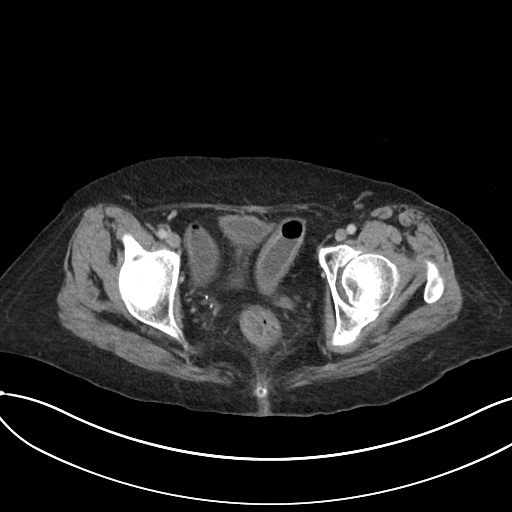
[im 27/102  soft-tissue]
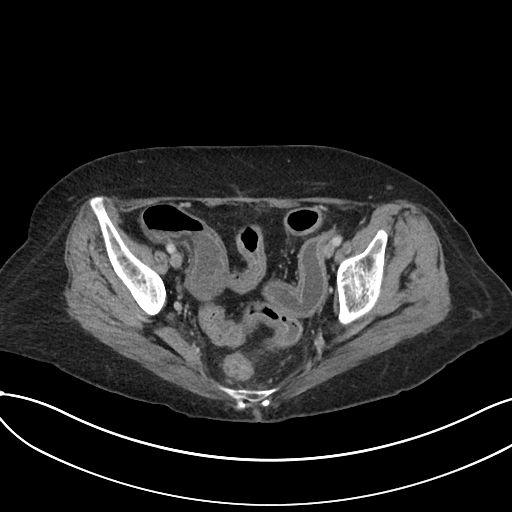
[im 34/102  soft-tissue]
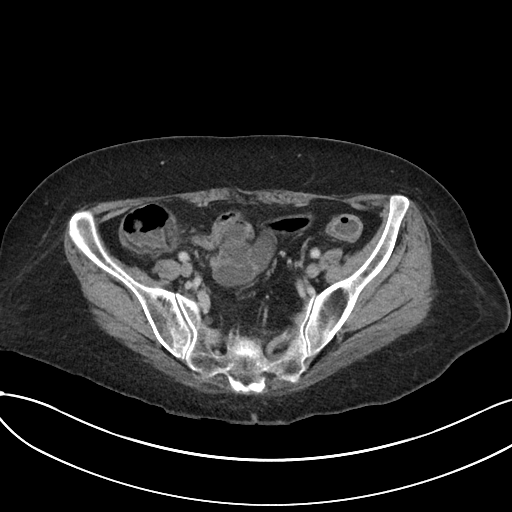
[im 41/102  soft-tissue]
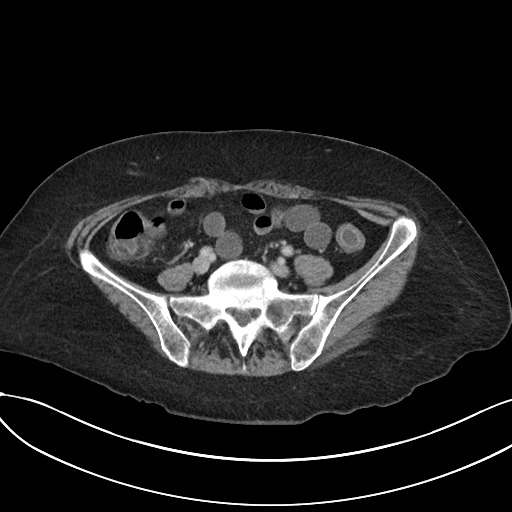
[im 54/102  soft-tissue]
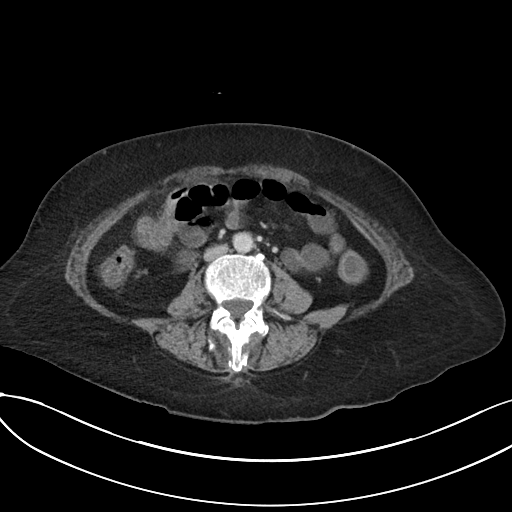
[im 61/102  soft-tissue]
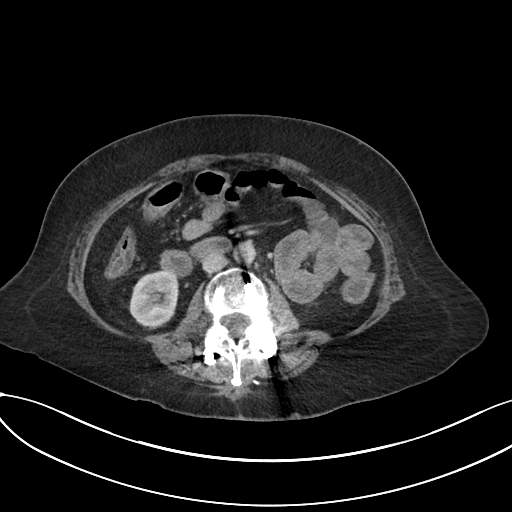
[im 68/102  soft-tissue]
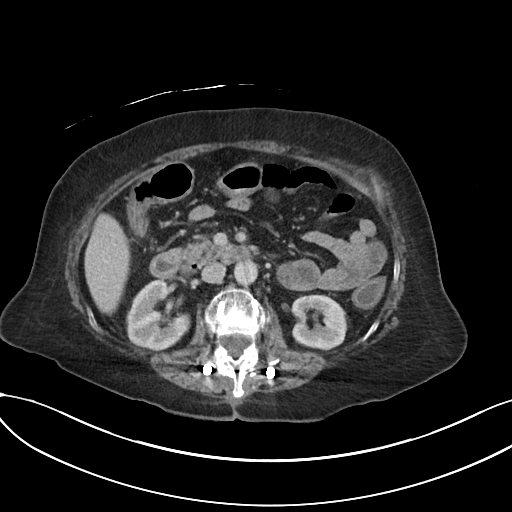
[im 68/102  bone]
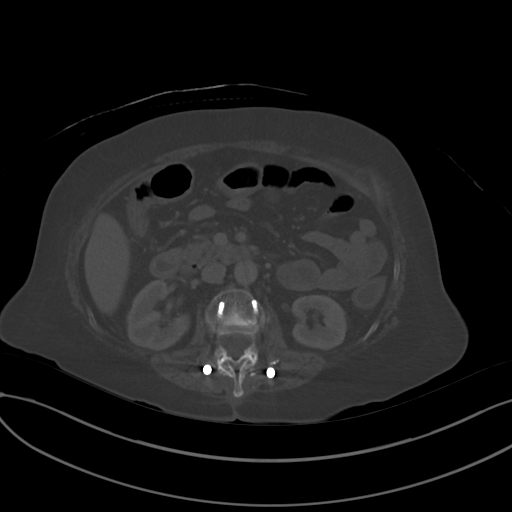
[im 75/102  soft-tissue]
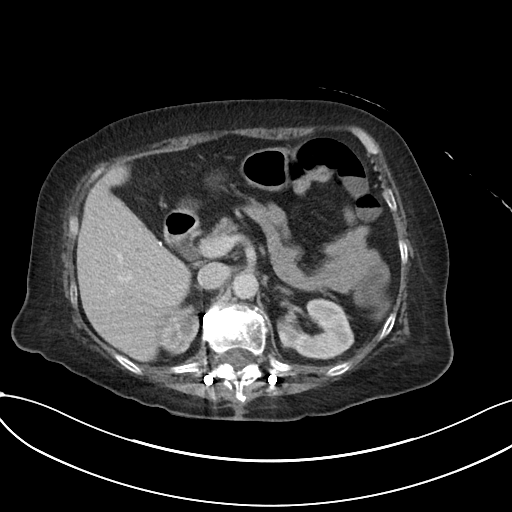
[im 81/102  soft-tissue]
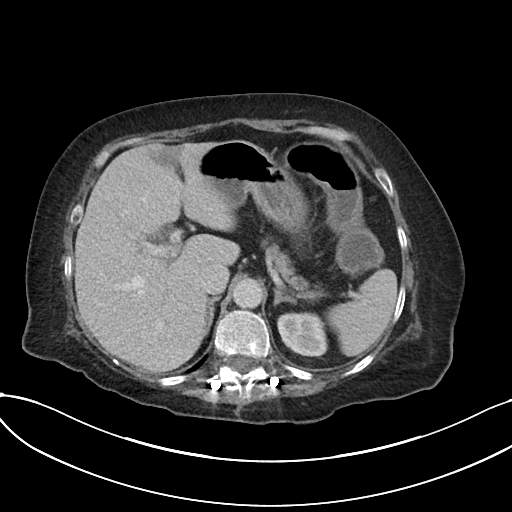
[im 88/102  soft-tissue]
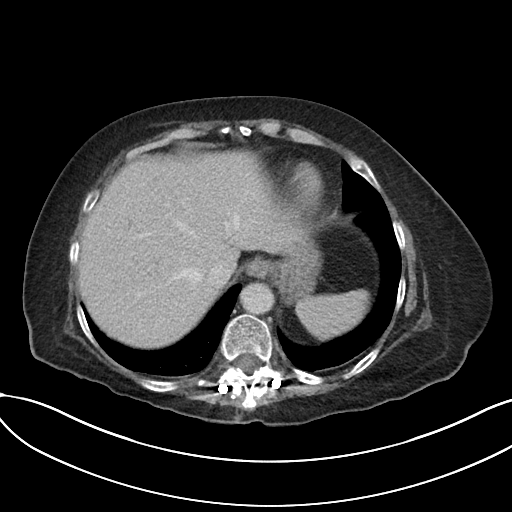
[im 95/102  soft-tissue]
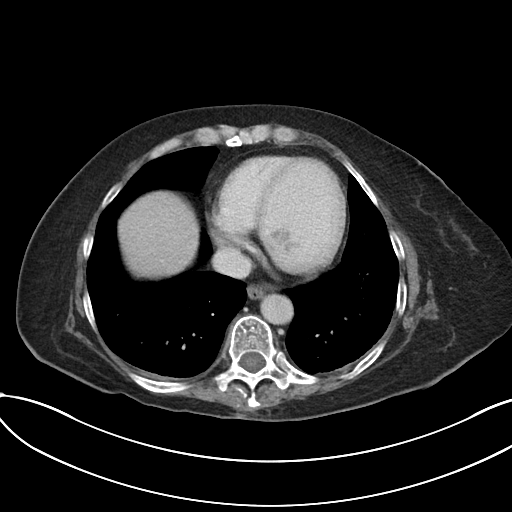

[Series 6: abdomen 3.0 mpr cor · coronal · 0.87mm/px · 3 of 92 slices shown]
[im 31/92  soft-tissue]
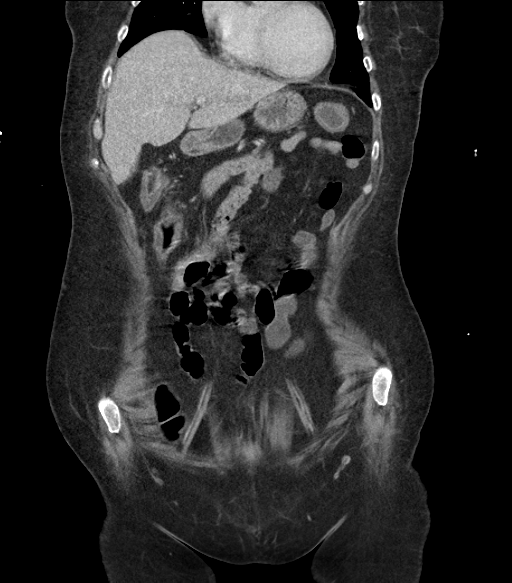
[im 41/92  soft-tissue]
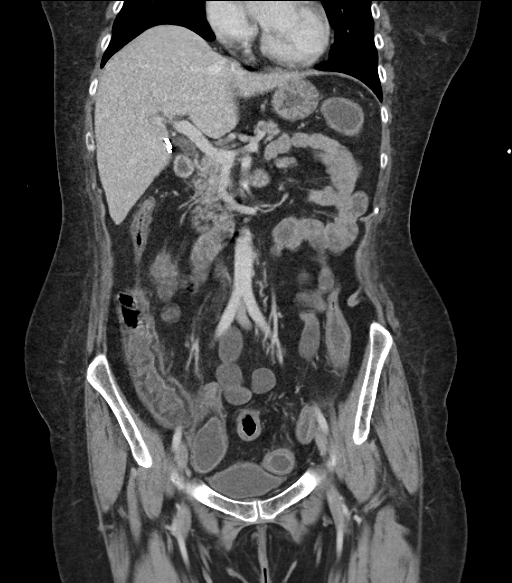
[im 51/92  soft-tissue]
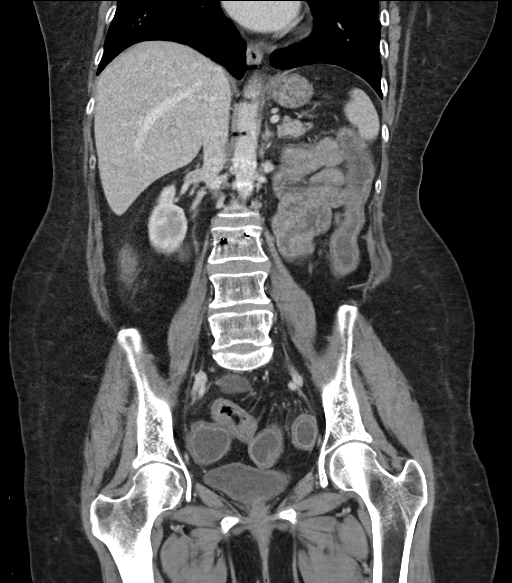

[16 of 46 positions shown; findings below may reference images not displayed]

FINDINGS: Lower chest: Slight fibrosis in the lung bases.

Hepatobiliary: No focal liver abnormality is seen. Status post
cholecystectomy. No biliary dilatation.

Pancreas: Unremarkable. No pancreatic ductal dilatation or
surrounding inflammatory changes.

Spleen: Normal in size without focal abnormality.

Adrenals/Urinary Tract: Subcentimeter right adrenal gland nodule
without change since prior study, statistically likely benign.
Kidneys are symmetrical. Nephrograms are homogeneous. No
hydronephrosis or hydroureter. Bladder is unremarkable.

Stomach/Bowel: There is diffuse wall thickening and edema involving
the colon throughout all segments and with involvement of the
terminal ileum. This likely represents an infectious or inflammatory
process. Consider pseudomembranous colitis, infectious colitis, or
inflammatory bowel disease. With terminal ileal involvement,
consider Crohn disease. No other small bowel wall thickening
identified. Stomach, small bowel, and colon are not abnormally
distended.

Vascular/Lymphatic: No significant vascular findings are present. No
enlarged abdominal or pelvic lymph nodes. Mesenteric vessels are
patent.

Reproductive: Status post hysterectomy. No adnexal masses.

Other: No abdominal wall hernia or abnormality. No abdominopelvic
ascites.

Musculoskeletal: Postoperative changes with posterior fixation of
the thoracolumbar spine. Cage prosthesis over an old compression
fracture at L1. No destructive bone lesions.
IMPRESSION: 1. Diffuse colonic wall thickening and edema throughout all segments
of the colon with involvement of the terminal ileum. This likely
represents an infectious or inflammatory process. Consider
pseudomembranous colitis, infectious colitis, or inflammatory bowel
disease. No evidence of bowel obstruction.
2. Mild fibrosis in the lung bases.
3. Status post cholecystectomy and hysterectomy.

## 2017-10-22 MED ORDER — CLONAZEPAM 0.5 MG PO TABS: 0.5000 mg | ORAL_TABLET | Freq: Three times a day (TID) | ORAL | Status: DC

## 2017-10-22 MED ORDER — CLONAZEPAM 1 MG PO TABS
1.0000 mg | ORAL_TABLET | Freq: Every day | ORAL | Status: DC
  Administered 2017-10-23 – 2017-10-27 (×5): 1 mg via ORAL
  Filled 2017-10-22 (×6): qty 1

## 2017-10-22 MED ORDER — ENOXAPARIN SODIUM 40 MG/0.4ML ~~LOC~~ SOLN
40.0000 mg | SUBCUTANEOUS | Status: DC
  Administered 2017-10-23 – 2017-10-27 (×6): 40 mg via SUBCUTANEOUS
  Filled 2017-10-22 (×6): qty 0.4

## 2017-10-22 MED ORDER — MAGNESIUM SULFATE 2 GM/50ML IV SOLN
2.0000 g | Freq: Once | INTRAVENOUS | Status: AC
  Administered 2017-10-22: 2 g via INTRAVENOUS
  Filled 2017-10-22: qty 50

## 2017-10-22 MED ORDER — POTASSIUM CHLORIDE 10 MEQ/100ML IV SOLN
10.0000 meq | INTRAVENOUS | Status: AC
  Administered 2017-10-22 (×2): 10 meq via INTRAVENOUS
  Filled 2017-10-22: qty 100

## 2017-10-22 MED ORDER — ACETAMINOPHEN 500 MG PO TABS
1000.0000 mg | ORAL_TABLET | Freq: Once | ORAL | Status: AC
  Administered 2017-10-22: 1000 mg via ORAL
  Filled 2017-10-22: qty 2

## 2017-10-22 MED ORDER — ONDANSETRON HCL 4 MG/2ML IJ SOLN: 4.0000 mg | Freq: Four times a day (QID) | INTRAMUSCULAR | Status: DC | PRN

## 2017-10-22 MED ORDER — SODIUM CHLORIDE 0.9 % IV BOLUS
1000.0000 mL | Freq: Once | INTRAVENOUS | Status: AC
  Administered 2017-10-22: 1000 mL via INTRAVENOUS

## 2017-10-22 MED ORDER — VANCOMYCIN 50 MG/ML ORAL SOLUTION
500.0000 mg | Freq: Four times a day (QID) | ORAL | Status: DC
  Administered 2017-10-22 – 2017-10-28 (×24): 500 mg via ORAL
  Filled 2017-10-22 (×27): qty 10

## 2017-10-22 MED ORDER — SODIUM CHLORIDE 0.9% FLUSH
3.0000 mL | Freq: Two times a day (BID) | INTRAVENOUS | Status: DC
  Administered 2017-10-23 – 2017-10-26 (×5): 3 mL via INTRAVENOUS

## 2017-10-22 MED ORDER — MORPHINE SULFATE (PF) 2 MG/ML IV SOLN: 2.0000 mg | INTRAVENOUS | Status: DC | PRN

## 2017-10-22 MED ORDER — ONDANSETRON HCL 4 MG PO TABS
4.0000 mg | ORAL_TABLET | Freq: Four times a day (QID) | ORAL | Status: DC | PRN
  Administered 2017-10-23: 4 mg via ORAL
  Filled 2017-10-22: qty 1

## 2017-10-22 MED ORDER — POTASSIUM CHLORIDE IN NACL 40-0.9 MEQ/L-% IV SOLN
INTRAVENOUS | Status: DC
  Administered 2017-10-22 – 2017-10-23 (×2): 125 mL/h via INTRAVENOUS
  Filled 2017-10-22 (×2): qty 1000

## 2017-10-22 MED ORDER — CLONAZEPAM 0.5 MG PO TABS
0.5000 mg | ORAL_TABLET | Freq: Two times a day (BID) | ORAL | Status: DC
  Administered 2017-10-23 – 2017-10-28 (×11): 0.5 mg via ORAL
  Filled 2017-10-22 (×10): qty 1

## 2017-10-22 MED ORDER — ACETAMINOPHEN 650 MG RE SUPP: 650.0000 mg | Freq: Four times a day (QID) | RECTAL | Status: DC | PRN

## 2017-10-22 MED ORDER — PANTOPRAZOLE SODIUM 40 MG PO TBEC
40.0000 mg | DELAYED_RELEASE_TABLET | Freq: Every day | ORAL | Status: DC
  Administered 2017-10-23 – 2017-10-28 (×6): 40 mg via ORAL
  Filled 2017-10-22 (×6): qty 1

## 2017-10-22 MED ORDER — IOPAMIDOL (ISOVUE-300) INJECTION 61%
INTRAVENOUS | Status: AC
  Filled 2017-10-22: qty 100

## 2017-10-22 MED ORDER — IOPAMIDOL (ISOVUE-300) INJECTION 61%
80.0000 mL | Freq: Once | INTRAVENOUS | Status: AC | PRN
  Administered 2017-10-22: 80 mL via INTRAVENOUS

## 2017-10-22 MED ORDER — ONDANSETRON HCL 4 MG/2ML IJ SOLN
4.0000 mg | Freq: Once | INTRAMUSCULAR | Status: AC
  Administered 2017-10-22: 4 mg via INTRAVENOUS
  Filled 2017-10-22: qty 2

## 2017-10-22 MED ORDER — METRONIDAZOLE IN NACL 5-0.79 MG/ML-% IV SOLN
500.0000 mg | Freq: Three times a day (TID) | INTRAVENOUS | Status: DC
  Administered 2017-10-22 – 2017-10-28 (×18): 500 mg via INTRAVENOUS
  Filled 2017-10-22 (×18): qty 100

## 2017-10-22 MED ORDER — ACETAMINOPHEN 325 MG PO TABS
650.0000 mg | ORAL_TABLET | Freq: Four times a day (QID) | ORAL | Status: DC | PRN
  Administered 2017-10-26: 650 mg via ORAL
  Filled 2017-10-22: qty 2

## 2017-10-22 MED ORDER — CHLORHEXIDINE GLUCONATE 0.12 % MT SOLN
15.0000 mL | Freq: Two times a day (BID) | OROMUCOSAL | Status: DC
  Administered 2017-10-23 – 2017-10-28 (×12): 15 mL via OROMUCOSAL
  Filled 2017-10-22 (×12): qty 15

## 2017-10-22 MED ORDER — TRAZODONE HCL 50 MG PO TABS
300.0000 mg | ORAL_TABLET | Freq: Every day | ORAL | Status: DC
  Administered 2017-10-23 – 2017-10-27 (×6): 300 mg via ORAL
  Filled 2017-10-22 (×6): qty 6

## 2017-10-22 MED ORDER — LACTATED RINGERS IV BOLUS
1000.0000 mL | Freq: Once | INTRAVENOUS | Status: AC
  Administered 2017-10-22: 1000 mL via INTRAVENOUS

## 2017-10-22 MED ORDER — LACTATED RINGERS IV BOLUS
500.0000 mL | Freq: Once | INTRAVENOUS | Status: AC
  Administered 2017-10-22: 500 mL via INTRAVENOUS

## 2017-10-22 MED ORDER — NICOTINE 14 MG/24HR TD PT24
14.0000 mg | MEDICATED_PATCH | Freq: Every day | TRANSDERMAL | Status: DC
  Administered 2017-10-23 – 2017-10-25 (×4): 14 mg via TRANSDERMAL
  Filled 2017-10-22 (×7): qty 1

## 2017-10-22 MED ORDER — DULOXETINE HCL 60 MG PO CPEP
60.0000 mg | ORAL_CAPSULE | Freq: Every day | ORAL | Status: DC
  Administered 2017-10-23 – 2017-10-27 (×6): 60 mg via ORAL
  Filled 2017-10-22 (×7): qty 1

## 2017-10-22 MED ORDER — GABAPENTIN 300 MG PO CAPS
300.0000 mg | ORAL_CAPSULE | Freq: Three times a day (TID) | ORAL | Status: DC
  Administered 2017-10-24 – 2017-10-27 (×6): 300 mg via ORAL
  Filled 2017-10-22 (×14): qty 1

## 2017-10-22 NOTE — ED Notes (Signed)
ED Provider at bedside. 

## 2017-10-22 NOTE — Telephone Encounter (Signed)
Patient calling and states that she has had diarrhea x2 weeks. States that she is weak, has no energy to make it to the bathroom, denies painting or blacking out. States that she is also dizzy and has a headache. Please advise.  Call to patient- after triage- advised to go to ED- she agrees and will call her ex-husband to take her there.  Reason for Disposition . [1] Drinking very little AND [2] dehydration suspected (e.g., no urine > 12 hours, very dry mouth, very lightheaded)  Answer Assessment - Initial Assessment Questions 1. DIARRHEA SEVERITY: "How bad is the diarrhea?" "How many extra stools have you had in the past 24 hours than normal?"    - NO DIARRHEA (SCALE 0)   - MILD (SCALE 1-3): Few loose or mushy BMs; increase of 1-3 stools over normal daily number of stools; mild increase in ostomy output.   -  MODERATE (SCALE 4-7): Increase of 4-6 stools daily over normal; moderate increase in ostomy output. * SEVERE (SCALE 8-10; OR 'WORST POSSIBLE'): Increase of 7 or more stools daily over normal; moderate increase in ostomy output; incontinence.     severe 2. ONSET: "When did the diarrhea begin?"      Started light- within a few days it got worse. 2 weeks 3. BM CONSISTENCY: "How loose or watery is the diarrhea?"      Mucus- watery 4. VOMITING: "Are you also vomiting?" If so, ask: "How many times in the past 24 hours?"      2-3 days ago- nothing in the last 24 hours 5. ABDOMINAL PAIN: "Are you having any abdominal pain?" If yes: "What does it feel like?" (e.g., crampy, dull, intermittent, constant)      Yes, crampy pain- intermittent 6. ABDOMINAL PAIN SEVERITY: If present, ask: "How bad is the pain?"  (e.g., Scale 1-10; mild, moderate, or severe)   - MILD (1-3): doesn't interfere with normal activities, abdomen soft and not tender to touch    - MODERATE (4-7): interferes with normal activities or awakens from sleep, tender to touch    - SEVERE (8-10): excruciating pain, doubled over, unable  to do any normal activities       8- severe 7. ORAL INTAKE: If vomiting, "Have you been able to drink liquids?" "How much fluids have you had in the past 24 hours?"     Trying to drink- every time patient drinks- it passes through 8. HYDRATION: "Any signs of dehydration?" (e.g., dry mouth [not just dry lips], too weak to stand, dizziness, new weight loss) "When did you last urinate?"     Dry mouth, weight loss- 142 , this morning- yellow- light 9. EXPOSURE: "Have you traveled to a foreign country recently?" "Have you been exposed to anyone with diarrhea?" "Could you have eaten any food that was spoiled?"     no 10. ANTIBIOTIC USE: "Are you taking antibiotics now or have you taken antibiotics in the past 2 months?"       Teeth pulled 7/30- antibiotic- 2 weeks 11. OTHER SYMPTOMS: "Do you have any other symptoms?" (e.g., fever, blood in stool)       Fever- 102- last night- spikes at night 12. PREGNANCY: "Is there any chance you are pregnant?" "When was your last menstrual period?"       n/a  Protocols used: DIARRHEA-A-AH

## 2017-10-22 NOTE — ED Notes (Signed)
Karla Foster (brother) [phone] call if pt moves to another room

## 2017-10-22 NOTE — ED Notes (Signed)
Admitting paged for pts BP

## 2017-10-22 NOTE — ED Notes (Signed)
Spoke with admitting doctor about pts BP, Admitting doctor stated goal MAP is 65 or greater. Charge RN at bedside and gave admitting doctor a list of the pts last 6 trending Bps, order for 528ml bolus obtained

## 2017-10-22 NOTE — ED Provider Notes (Signed)
Medical screening examination/treatment/procedure(s) were conducted as a shared visit with non-physician practitioner(s) and myself.  I personally evaluated the patient during the encounter.  EKG Interpretation  Date/Time:  Tuesday October 22 2017 13:30:12 EDT Ventricular Rate:  76 PR Interval:    QRS Duration: 121 QT Interval:  457 QTC Calculation: 514 R Axis:   79 Text Interpretation:  Sinus rhythm Atrial premature complexes Short PR interval Left bundle branch block Baseline wander in lead(s) II III aVL aVF V6 no sig change from previous Confirmed by Charlesetta Shanks [phone]) on 10/22/2017 2:50:05 PM  With 2 weeks of diarrhea.  2 days ago sudden increase in frequency with associated intermittent vomiting.  She has been having generalized weakness now.  She did recently finish a course of antibiotics for dental work.  She is alert and appropriate.  Mental status clear.  No respiratory distress.  He is currently getting IV fluids and antibiotics.  Heart regular.  Lungs clear.  Abdomen is soft but moderate to severe lower abdominal tenderness diffusely.  No lower extremity edema.  Skin is warm and dry.  Patient's mental status is clear with coordinated purposeful movements.  Patient presents with C. difficile colitis.  She has gotten significantly dehydrated with hypokalemia.  Patient has significant kasai ptosis and reports fever at home.  This protocol initiated I agree with plan of management.   Charlesetta Shanks, MD 10/22/17 1452
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
## 2017-10-22 NOTE — ED Notes (Signed)
Lab called with positive C.diff.
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
## 2017-10-23 ENCOUNTER — Other Ambulatory Visit: Payer: Self-pay

## 2017-10-23 LAB — CBC
HCT: 36.5 % (ref 36.0–46.0)
Hemoglobin: 11.8 g/dL — ABNORMAL LOW (ref 12.0–15.0)
MCH: 30.2 pg (ref 26.0–34.0)
MCHC: 32.3 g/dL (ref 30.0–36.0)
MCV: 93.4 fL (ref 78.0–100.0)
PLATELETS: 221 10*3/uL (ref 150–400)
RBC: 3.91 MIL/uL (ref 3.87–5.11)
RDW: 14.5 % (ref 11.5–15.5)
WBC: 24.8 10*3/uL — ABNORMAL HIGH (ref 4.0–10.5)

## 2017-10-23 LAB — BASIC METABOLIC PANEL
Anion gap: 9 (ref 5–15)
BUN: 9 mg/dL (ref 8–23)
CO2: 23 mmol/L (ref 22–32)
CREATININE: 0.91 mg/dL (ref 0.44–1.00)
Calcium: 8.2 mg/dL — ABNORMAL LOW (ref 8.9–10.3)
Chloride: 108 mmol/L (ref 98–111)
GFR calc Af Amer: >60 mL/min (ref >60)
GFR calc non Af Amer: >60 mL/min (ref >60)
GLUCOSE: 82 mg/dL (ref 70–99)
Potassium: 3 mmol/L — ABNORMAL LOW (ref 3.5–5.1)
Sodium: 140 mmol/L (ref 135–145)

## 2017-10-23 LAB — HIV ANTIBODY (ROUTINE TESTING W REFLEX): HIV Screen 4th Generation wRfx: NONREACTIVE

## 2017-10-23 LAB — MAGNESIUM: Magnesium: 2.7 mg/dL — ABNORMAL HIGH (ref 1.7–2.4)

## 2017-10-23 LAB — MRSA PCR SCREENING: MRSA by PCR: NEGATIVE

## 2017-10-23 MED ORDER — PNEUMOCOCCAL VAC POLYVALENT 25 MCG/0.5ML IJ INJ
0.5000 mL | INJECTION | INTRAMUSCULAR | Status: AC
  Administered 2017-10-28: 0.5 mL via INTRAMUSCULAR
  Filled 2017-10-23 (×2): qty 0.5

## 2017-10-23 MED ORDER — POTASSIUM CHLORIDE CRYS ER 20 MEQ PO TBCR
40.0000 meq | EXTENDED_RELEASE_TABLET | Freq: Three times a day (TID) | ORAL | Status: DC
  Administered 2017-10-23: 40 meq via ORAL
  Filled 2017-10-23: qty 2

## 2017-10-23 MED ORDER — ENSURE ENLIVE PO LIQD
237.0000 mL | Freq: Two times a day (BID) | ORAL | Status: DC
  Administered 2017-10-23 – 2017-10-28 (×6): 237 mL via ORAL

## 2017-10-23 MED ORDER — SODIUM CHLORIDE 0.9 % IV SOLN
INTRAVENOUS | Status: DC
  Administered 2017-10-23 (×2): via INTRAVENOUS
  Filled 2017-10-23 (×3): qty 1000

## 2017-10-23 NOTE — Progress Notes (Signed)
Initial Nutrition Assessment  DOCUMENTATION CODES:   Non-severe (moderate) malnutrition in context of chronic illness  INTERVENTION:   - Ensure Enlive po BID, each supplement provides 350 kcal and 20 grams of protein (chocolate flavor)  - Encourage adequate PO intake  Monitor magnesium, potassium, and phosphorus daily for at least 3 days, MD to replete as needed, as pt is at risk for refeeding syndrome given little to no PO intake x 2 weeks PTA, moderate protein-calorie malnutrition, and hypokalemia.  NUTRITION DIAGNOSIS:   Moderate Malnutrition related to chronic illness (COPD) as evidenced by mild fat depletion, mild muscle depletion, moderate muscle depletion, percent weight loss (16.5% weight loss in less than 9 months).  GOAL:   Patient will meet greater than or equal to 90% of their needs  MONITOR:   PO intake, Supplement acceptance, Diet advancement, Weight trends, Labs, I & O's  REASON FOR ASSESSMENT:   Malnutrition Screening Tool    ASSESSMENT:   62 year old female who presented to the ED with diarrhea x 2 weeks. PMH significant for hypertension, COPD, GERD, hyperthyroidism, pulmonary tuberculosis, and osteoarthritis. Pt is C. difficile positive.  Discussed pt with RN who reports pt continues to have diarrhea.  Spoke with pt at bedside who was glad to speak with RD. Pt reports little to no PO intake for 2 weeks PTA. Pt states that anything she ate or drank "went right through me." Pt gives the example of eating a few teaspoons of green beans then having diarrhea almost immediately after.  Pt reports that during the past 2 weeks she has lost 9 lbs. Pt reports her UBW as 152 lbs and that she now weighs 141 lbs. Discussed how weight loss may be affected by dehydration and fluid status given pt's persistent diarrhea. Pt reports her weight was stable around 150 lbs prior to the past 2 weeks.  Per weight history in chart, pt has lost 28 lbs since January 2019. This is a  16.5% weight loss in less than 9 months which is significant for timeframe.  Pt endorses difficulty chewing as she had her teeth removed "a couple weeks ago." Pt states, "it is hard to eat bacon." Pt reports that she typically eats "whenever I'm hungry" and that "I've never been a 3 meal a day person." A typical meal may include a sandwich or eggs and bacon.  Pt agreeable to receiving Ensure Enlive during admission to maximize energy and protein intake. Pt did share that she was looking forward to eating spaghetti with her lunch meal.  Medications reviewed and include: 40 mg Protonix daily, 40 mEq K-dur TID, IV antibiotics, IV KCl  Labs reviewed: potassium 3.0 (L) - being replaced, magnesium 2.7 (H), hemoglobin 11.8 (L)  NUTRITION - FOCUSED PHYSICAL EXAM:    Most Recent Value  Orbital Region  Mild depletion  Upper Arm Region  Mild depletion  Thoracic and Lumbar Region  No depletion  Buccal Region  Mild depletion  Temple Region  Moderate depletion  Clavicle Bone Region  Moderate depletion  Clavicle and Acromion Bone Region  Moderate depletion  Scapular Bone Region  Unable to assess  Dorsal Hand  Mild depletion  Patellar Region  Mild depletion  Anterior Thigh Region  Mild depletion  Posterior Calf Region  Mild depletion  Edema (RD Assessment)  None  Hair  Reviewed  Eyes  Reviewed  Mouth  Reviewed  Skin  Reviewed  Nails  Reviewed       Diet Order:   Diet Order  DIET SOFT Room service appropriate? Yes; Fluid consistency: Thin  Diet effective now              EDUCATION NEEDS:   No education needs have been identified at this time  Skin:  Skin Assessment: Reviewed RN Assessment  Last BM:  10/23/17 - medium type 7  Height:   Ht Readings from Last 1 Encounters:  10/22/17 5\' 7"  (1.702 m)    Weight:   Wt Readings from Last 1 Encounters:  10/22/17 64.4 kg    Ideal Body Weight:  61.36 kg  BMI:  Body mass index is 22.24 kg/m.  Estimated Nutritional  Needs:   Kcal:  1600-1800  Protein:  80-95 grams  Fluid:  >/= 1.8 L    Gaynell Face, MS, RD, LDN Pager: [phone] Weekend/After Hours: [phone]

## 2017-10-23 NOTE — Progress Notes (Signed)
@IPLOG @        PROGRESS NOTE                                                                                                                                                                                                             Patient Demographics:    Karla Foster, is a 62 y.o. female, DOB - 1955-11-13, QZR:[id number]  Admit date - 10/22/2017   Admitting Physician Karmen Bongo, MD  Outpatient Primary MD for the patient is Vivi Barrack, MD  LOS - 1  Chief Complaint  Patient presents with  . Diarrhea       Brief Narrative  Karla Foster is a 62 y.o. female with medical history significant of TB; hyperthyroidism; HTN; HLD; and depression presenting with diarrhea.  She developed diarrhea few days ago.  Of note last week she had exposure to oral clindamycin for 7 days regarding a dental infection, she has been diagnosed with C. difficile colitis this hospital admission and admitted for further care.   Subjective:    Karla Foster today has, No headache, No chest pain, No abdominal pain - No Nausea, No new weakness tingling or numbness, No Cough - SOB. +++ Diarrhea.   Assessment  & Plan :     1.  Severe C. difficile colitis with profuse diarrhea and leukocytosis.  She is currently on aggressive IV fluid for hydration, oral vancomycin along with IV Flagyl which will be continued, continue monitoring with supportive care, abdominal exam is benign.  2.  ARF.  Due to dehydration: #1 above.  Resolved after IV fluids.  3.  Hypokalemia.  Replaced.  4.  History of smoking.  Counseled to quit.  5.  Diet and depression.  Stable, no acute issues, not suicidal homicidal, home medications which include Cymbalta, Neurontin, trazodone along with Klonopin are continued at home dose.    Family Communication  :  None  Code Status :  Full  Disposition Plan  :  Stay inpt due to severe C. difficile colitis  Consults  : None  Procedures  :    CT abdomen pelvis. 1. Diffuse  colonic wall thickening and edema throughout all segments of the colon with involvement of the terminal ileum. This likely represents an infectious or inflammatory process. Consider pseudomembranous colitis, infectious colitis, or inflammatory bowel disease. No evidence of bowel obstruction. 2. Mild fibrosis in the lung bases. 3. Status post cholecystectomy and hysterectomy.  DVT Prophylaxis  :  Lovenox   Lab Results  Component Value Date  PLT 221 10/23/2017    Diet :  Diet Order            DIET SOFT Room service appropriate? Yes; Fluid consistency: Thin  Diet effective now               Inpatient Medications Scheduled Meds: . chlorhexidine  15 mL Mouth Rinse BID  . clonazePAM  0.5 mg Oral BID WC  . clonazePAM  1 mg Oral Q2000  . DULoxetine  60 mg Oral QHS  . enoxaparin (LOVENOX) injection  40 mg Subcutaneous Q24H  . gabapentin  300 mg Oral TID  . nicotine  14 mg Transdermal Daily  . pantoprazole  40 mg Oral Daily  . [START ON 10/24/2017] pneumococcal 23 valent vaccine  0.5 mL Intramuscular Tomorrow-1000  . sodium chloride flush  3 mL Intravenous Q12H  . trazodone  300 mg Oral QHS  . vancomycin  500 mg Oral Q6H   Continuous Infusions: . metronidazole 500 mg (10/23/17 6073)  . 0.9 % sodium chloride with kcl 75 mL/hr at 10/23/17 0853   PRN Meds:.acetaminophen **OR** [DISCONTINUED] acetaminophen, morphine injection, ondansetron **OR** ondansetron (ZOFRAN) IV  Antibiotics  :   Anti-infectives (From admission, onward)   Start     Dose/Rate Route Frequency Ordered Stop   10/22/17 1430  vancomycin (VANCOCIN) 50 mg/mL oral solution 500 mg     500 mg Oral Every 6 hours 10/22/17 1359 11/05/17 1159   10/22/17 1400  metroNIDAZOLE (FLAGYL) IVPB 500 mg     500 mg 100 mL/hr over 60 Minutes Intravenous Every 8 hours 10/22/17 1359 11/05/17 1359          Objective:   Vitals:   10/22/17 1935 10/22/17 1940 10/22/17 2035 10/23/17 0715  BP: (!) 101/57 (!) 97/57 122/73 107/63   Pulse: 73 70 74 72  Resp: (!) 22 17 19    Temp:   97.8 F (36.6 C) (!) 97.5 F (36.4 C)  TempSrc:   Oral Oral  SpO2: 94% 97% 92% 97%  Weight:      Height:        Wt Readings from Last 3 Encounters:  10/22/17 64.4 kg  08/19/17 71.9 kg  03/25/17 76.2 kg     Intake/Output Summary (Last 24 hours) at 10/23/2017 1104 Last data filed at 10/23/2017 7106 Gross per 24 hour  Intake 4194.84 ml  Output 3 ml  Net 4191.84 ml     Physical Exam  Awake Alert, Oriented X 3, No new F.N deficits, Normal affect [place].AT,PERRAL Supple Neck,No JVD, No cervical lymphadenopathy appriciated.  Symmetrical Chest wall movement, Good air movement bilaterally, CTAB RRR,No Gallops,Rubs or new Murmurs, No Parasternal Heave +ve B.Sounds, Abd Soft, No tenderness, No organomegaly appriciated, No rebound - guarding or rigidity. No Cyanosis, Clubbing or edema, No new Rash or bruise       Data Review:    CBC Recent Labs  Lab 10/22/17 1155 10/23/17 0016  WBC 32.9* 24.8*  HGB 14.5 11.8*  HCT 44.7 36.5  PLT 272 221  MCV 93.1 93.4  MCH 30.2 30.2  MCHC 32.4 32.3  RDW 14.3 14.5  LYMPHSABS 2.2  --   MONOABS 2.6*  --   EOSABS 0.1  --   BASOSABS 0.1  --     Chemistries  Recent Labs  Lab 10/22/17 1155 10/23/17 0016  NA 136 140  K 2.6* 3.0*  CL 102 108  CO2 22 23  GLUCOSE 159* 82  BUN 11 9  CREATININE 1.24* 0.91  CALCIUM  8.9 8.2*  MG 1.6* 2.7*  AST 23  --   ALT 14  --   ALKPHOS 152*  --   BILITOT 0.8  --    ------------------------------------------------------------------------------------------------------------------ No results for input(s): CHOL, HDL, LDLCALC, TRIG, CHOLHDL, LDLDIRECT in the last 72 hours.  No results found for: HGBA1C ------------------------------------------------------------------------------------------------------------------ No results for input(s): TSH, T4TOTAL, T3FREE, THYROIDAB in the last 72 hours.  Invalid input(s):  FREET3 ------------------------------------------------------------------------------------------------------------------ No results for input(s): VITAMINB12, FOLATE, FERRITIN, TIBC, IRON, RETICCTPCT in the last 72 hours.  Coagulation profile No results for input(s): INR, PROTIME in the last 168 hours.  No results for input(s): DDIMER in the last 72 hours.  Cardiac Enzymes No results for input(s): CKMB, TROPONINI, MYOGLOBIN in the last 168 hours.  Invalid input(s): CK ------------------------------------------------------------------------------------------------------------------    Component Value Date/Time   BNP 181 (H) 03/08/2017 1503    Micro Results Recent Results (from the past 240 hour(s))  Blood Culture (routine x 2)     Status: None (Preliminary result)   Collection Time: 10/22/17 12:35 PM  Result Value Ref Range Status   Specimen Description BLOOD RIGHT ANTECUBITAL  Final   Special Requests   Final    BOTTLES DRAWN AEROBIC AND ANAEROBIC Blood Culture adequate volume   Culture   Final    NO GROWTH < 24 HOURS Performed at [facility], [place] [address], [place], [place] [address]    Report Status PENDING  Incomplete  C difficile quick scan w PCR reflex     Status: Abnormal   Collection Time: 10/22/17 12:41 PM  Result Value Ref Range Status   C Diff antigen POSITIVE (A) NEGATIVE Final   C Diff toxin POSITIVE (A) NEGATIVE Final   C Diff interpretation Toxin producing C. difficile detected.  Final    Comment: CRITICAL RESULT CALLED TO, READ BACK BY AND VERIFIED WITH: Bethena Midget RN 13:45 10/22/17 ([email or web address]) Performed at [facility], [place] [address] [place], [place] [address]   Gastrointestinal Panel by PCR , Stool     Status: None   Collection Time: 10/22/17 12:41 PM  Result Value Ref Range Status   Campylobacter species NOT DETECTED NOT DETECTED Final   Plesimonas shigelloides NOT DETECTED NOT DETECTED Final   Salmonella species NOT DETECTED NOT DETECTED  Final   Yersinia enterocolitica NOT DETECTED NOT DETECTED Final   Vibrio species NOT DETECTED NOT DETECTED Final   Vibrio cholerae NOT DETECTED NOT DETECTED Final   Enteroaggregative E coli (EAEC) NOT DETECTED NOT DETECTED Final   Enteropathogenic E coli (EPEC) NOT DETECTED NOT DETECTED Final   Enterotoxigenic E coli (ETEC) NOT DETECTED NOT DETECTED Final   Shiga like toxin producing E coli (STEC) NOT DETECTED NOT DETECTED Final   Shigella/Enteroinvasive E coli (EIEC) NOT DETECTED NOT DETECTED Final   Cryptosporidium NOT DETECTED NOT DETECTED Final   Cyclospora cayetanensis NOT DETECTED NOT DETECTED Final   Entamoeba histolytica NOT DETECTED NOT DETECTED Final   Giardia lamblia NOT DETECTED NOT DETECTED Final   Adenovirus F40/41 NOT DETECTED NOT DETECTED Final   Astrovirus NOT DETECTED NOT DETECTED Final   Norovirus GI/GII NOT DETECTED NOT DETECTED Final   Rotavirus A NOT DETECTED NOT DETECTED Final   Sapovirus (I, II, IV, and V) NOT DETECTED NOT DETECTED Final    Comment: Performed at [facility], [place] [place], [place] [address]  Blood Culture (routine x 2)     Status: None (Preliminary result)   Collection Time: 10/22/17  2:17 PM  Result Value Ref Range Status   Specimen Description BLOOD RIGHT FOREARM  Final   Special Requests   Final    BOTTLES DRAWN AEROBIC AND ANAEROBIC Blood Culture adequate volume   Culture   Final    NO GROWTH < 24 HOURS Performed at [facility], [address] [place], [place] [address]    Report Status PENDING  Incomplete    Radiology Reports Ct Abdomen Pelvis W Contrast  Result Date: 10/22/2017 CLINICAL DATA:  Generalized weakness, diarrhea, and vomiting for 2 weeks. EXAM: CT ABDOMEN AND PELVIS WITH CONTRAST TECHNIQUE: Multidetector CT imaging of the abdomen and pelvis was performed using the standard protocol following bolus administration of intravenous contrast. CONTRAST:  62mL ISOVUE-300 IOPAMIDOL (ISOVUE-300) INJECTION  61% COMPARISON:  04/25/2015 FINDINGS: Lower chest: Slight fibrosis in the lung bases. Hepatobiliary: No focal liver abnormality is seen. Status post cholecystectomy. No biliary dilatation. Pancreas: Unremarkable. No pancreatic ductal dilatation or surrounding inflammatory changes. Spleen: Normal in size without focal abnormality. Adrenals/Urinary Tract: Subcentimeter right adrenal gland nodule without change since prior study, statistically likely benign. Kidneys are symmetrical. Nephrograms are homogeneous. No hydronephrosis or hydroureter. Bladder is unremarkable. Stomach/Bowel: There is diffuse wall thickening and edema involving the colon throughout all segments and with involvement of the terminal ileum. This likely represents an infectious or inflammatory process. Consider pseudomembranous colitis, infectious colitis, or inflammatory bowel disease. With terminal ileal involvement, consider Crohn disease. No other small bowel wall thickening identified. Stomach, small bowel, and colon are not abnormally distended. Vascular/Lymphatic: No significant vascular findings are present. No enlarged abdominal or pelvic lymph nodes. Mesenteric vessels are patent. Reproductive: Status post hysterectomy. No adnexal masses. Other: No abdominal wall hernia or abnormality. No abdominopelvic ascites. Musculoskeletal: Postoperative changes with posterior fixation of the thoracolumbar spine. Cage prosthesis over an old compression fracture at L1. No destructive bone lesions. IMPRESSION: 1. Diffuse colonic wall thickening and edema throughout all segments of the colon with involvement of the terminal ileum. This likely represents an infectious or inflammatory process. Consider pseudomembranous colitis, infectious colitis, or inflammatory bowel disease. No evidence of bowel obstruction. 2. Mild fibrosis in the lung bases. 3. Status post cholecystectomy and hysterectomy. Electronically Signed   By: Lucienne Capers M.D.   On:  10/22/2017 21:57    Time Spent in minutes  30   Lala Lund M.D on 10/23/2017 at 11:04 AM  To page go to [email or web address] - password [facility]

## 2017-10-24 DIAGNOSIS — E440 Moderate protein-calorie malnutrition: Secondary | ICD-10-CM

## 2017-10-24 LAB — COMPREHENSIVE METABOLIC PANEL
ALBUMIN: 2.1 g/dL — AB (ref 3.5–5.0)
ALT: 8 U/L (ref 0–44)
ANION GAP: 5 (ref 5–15)
AST: 12 U/L — ABNORMAL LOW (ref 15–41)
Alkaline Phosphatase: 80 U/L (ref 38–126)
BUN: 5 mg/dL — ABNORMAL LOW (ref 8–23)
CHLORIDE: 119 mmol/L — AB (ref 98–111)
CO2: 19 mmol/L — ABNORMAL LOW (ref 22–32)
Calcium: 8.2 mg/dL — ABNORMAL LOW (ref 8.9–10.3)
Creatinine, Ser: 0.74 mg/dL (ref 0.44–1.00)
GFR calc Af Amer: >60 mL/min (ref >60)
GFR calc non Af Amer: >60 mL/min (ref >60)
GLUCOSE: 108 mg/dL — AB (ref 70–99)
Potassium: 4.1 mmol/L (ref 3.5–5.1)
SODIUM: 143 mmol/L (ref 135–145)
TOTAL PROTEIN: 4.6 g/dL — AB (ref 6.5–8.1)
Total Bilirubin: 0.5 mg/dL (ref 0.3–1.2)

## 2017-10-24 LAB — CBC
HCT: 33.4 % — ABNORMAL LOW (ref 36.0–46.0)
HEMOGLOBIN: 10.5 g/dL — AB (ref 12.0–15.0)
MCH: 29.7 pg (ref 26.0–34.0)
MCHC: 31.4 g/dL (ref 30.0–36.0)
MCV: 94.4 fL (ref 78.0–100.0)
Platelets: 229 10*3/uL (ref 150–400)
RBC: 3.54 MIL/uL — ABNORMAL LOW (ref 3.87–5.11)
RDW: 14.7 % (ref 11.5–15.5)
WBC: 10 10*3/uL (ref 4.0–10.5)

## 2017-10-24 LAB — MAGNESIUM: Magnesium: 1.7 mg/dL (ref 1.7–2.4)

## 2017-10-24 MED ORDER — LACTATED RINGERS IV BOLUS
500.0000 mL | Freq: Once | INTRAVENOUS | Status: AC
  Administered 2017-10-24: 500 mL via INTRAVENOUS

## 2017-10-24 MED ORDER — LACTATED RINGERS IV SOLN: INTRAVENOUS | Status: DC

## 2017-10-24 MED ORDER — VANCOMYCIN HCL 250 MG PO CAPS: 500.0000 mg | ORAL_CAPSULE | Freq: Four times a day (QID) | ORAL | 0 refills | Status: DC

## 2017-10-24 MED ORDER — MAGNESIUM SULFATE IN D5W 1-5 GM/100ML-% IV SOLN
1.0000 g | Freq: Once | INTRAVENOUS | Status: AC
  Administered 2017-10-24: 1 g via INTRAVENOUS
  Filled 2017-10-24: qty 100

## 2017-10-24 MED ORDER — POTASSIUM CHLORIDE 2 MEQ/ML IV SOLN
INTRAVENOUS | Status: AC
  Administered 2017-10-24 – 2017-10-25 (×4): via INTRAVENOUS
  Filled 2017-10-24 (×7): qty 1000

## 2017-10-24 MED ORDER — VANCOMYCIN 50 MG/ML ORAL SOLUTION: 500.0000 mg | Freq: Four times a day (QID) | ORAL | 0 refills | Status: DC

## 2017-10-24 MED ORDER — LACTATED RINGERS IV BOLUS
1000.0000 mL | Freq: Once | INTRAVENOUS | Status: AC
  Administered 2017-10-24: 1000 mL via INTRAVENOUS

## 2017-10-24 MED FILL — VANCOMYCIN HCL 250 MG CAP: 250 | 10 days supply | Qty: 80 | Fill #0

## 2017-10-24 NOTE — Progress Notes (Signed)
@IPLOG @        PROGRESS NOTE                                                                                                                                                                                                             Patient Demographics:    Karla Foster, is a 62 y.o. female, DOB - 1955/03/13, FIE:[id number]  Admit date - 10/22/2017   Admitting Physician Karmen Bongo, MD  Outpatient Primary MD for the patient is Vivi Barrack, MD  LOS - 2  Chief Complaint  Patient presents with  . Diarrhea       Brief Narrative  Karla Foster is a 62 y.o. female with medical history significant of TB; hyperthyroidism; HTN; HLD; and depression presenting with diarrhea.  She developed diarrhea few days ago.  Of note last week she had exposure to oral clindamycin for 7 days regarding a dental infection, she has been diagnosed with C. difficile colitis this hospital admission and admitted for further care.   Subjective:   Patient in bed, appears comfortable, denies any headache, no fever, no chest pain or pressure, no shortness of breath , no abdominal pain. No focal weakness.  Her diarrhea is improving and she feels better overall.   Assessment  & Plan :     1.  Severe C. difficile colitis with profuse diarrhea and leukocytosis.  Clinically much improved after aggressive IV fluids for hydration, oral vancomycin and IV Flagyl combination, blood pressures are stabilizing, diarrhea has improved, overall looks better, continue present treatment and try to advance activity.  2.  ARF.  Due to dehydration: #1 above.  Resolved after IV fluids.  3.  Hypokalemia.  Replaced and stable.  4.  History of smoking.  Counseled to quit.  5.  Anxiety and depression.  Stable, no acute issues, not suicidal homicidal, home medications which include Cymbalta, Neurontin, trazodone along with Klonopin are continued at home dose.    Family Communication  :  None  Code Status :   Full  Disposition Plan  :  Stay inpt due to severe C. difficile colitis  Consults  : None  Procedures  :    CT abdomen pelvis. 1. Diffuse colonic wall thickening and edema throughout all segments of the colon with involvement of the terminal ileum. This likely represents an infectious or inflammatory process. Consider pseudomembranous colitis, infectious colitis, or inflammatory bowel disease. No evidence of bowel obstruction. 2. Mild fibrosis in the lung bases. 3. Status post cholecystectomy and hysterectomy.  DVT  Prophylaxis  :  Lovenox   Lab Results  Component Value Date   PLT 229 10/24/2017    Diet :  Diet Order            DIET SOFT Room service appropriate? Yes; Fluid consistency: Thin  Diet effective now               Inpatient Medications Scheduled Meds: . chlorhexidine  15 mL Mouth Rinse BID  . clonazePAM  0.5 mg Oral BID WC  . clonazePAM  1 mg Oral Q2000  . DULoxetine  60 mg Oral QHS  . enoxaparin (LOVENOX) injection  40 mg Subcutaneous Q24H  . feeding supplement (ENSURE ENLIVE)  237 mL Oral BID BM  . gabapentin  300 mg Oral TID  . nicotine  14 mg Transdermal Daily  . pantoprazole  40 mg Oral Daily  . pneumococcal 23 valent vaccine  0.5 mL Intramuscular Tomorrow-1000  . sodium chloride flush  3 mL Intravenous Q12H  . trazodone  300 mg Oral QHS  . vancomycin  500 mg Oral Q6H   Continuous Infusions: . lactated ringers with kcl    . metronidazole 500 mg (10/24/17 0600)   PRN Meds:.acetaminophen **OR** [DISCONTINUED] acetaminophen, morphine injection, ondansetron **OR** ondansetron (ZOFRAN) IV  Antibiotics  :   Anti-infectives (From admission, onward)   Start     Dose/Rate Route Frequency Ordered Stop   10/22/17 1430  vancomycin (VANCOCIN) 50 mg/mL oral solution 500 mg     500 mg Oral Every 6 hours 10/22/17 1359 11/05/17 1159   10/22/17 1400  metroNIDAZOLE (FLAGYL) IVPB 500 mg     500 mg 100 mL/hr over 60 Minutes Intravenous Every 8 hours 10/22/17 1359  11/05/17 1359          Objective:   Vitals:   10/24/17 0207 10/24/17 0606 10/24/17 0659 10/24/17 0745  BP: (!) 110/58 (!) 89/62 (!) 79/48 99/75  Pulse: 80 79 74 85  Resp: 19 (!) 28 18 18   Temp:   98.4 F (36.9 C) 97.6 F (36.4 C)  TempSrc:   Oral Oral  SpO2: 96% 95% 95% 96%  Weight:      Height:        Wt Readings from Last 3 Encounters:  10/22/17 64.4 kg  08/19/17 71.9 kg  03/25/17 76.2 kg     Intake/Output Summary (Last 24 hours) at 10/24/2017 1006 Last data filed at 10/24/2017 0946 Gross per 24 hour  Intake 1920.55 ml  Output 150 ml  Net 1770.55 ml     Physical Exam  Awake Alert, Oriented X 3, No new F.N deficits, Normal affect [place].AT,PERRAL Supple Neck,No JVD, No cervical lymphadenopathy appriciated.  Symmetrical Chest wall movement, Good air movement bilaterally, CTAB RRR,No Gallops, Rubs or new Murmurs, No Parasternal Heave +ve B.Sounds, Abd Soft, No tenderness, No organomegaly appriciated, No rebound - guarding or rigidity. No Cyanosis, Clubbing or edema, No new Rash or bruise    Data Review:    CBC Recent Labs  Lab 10/22/17 1155 10/23/17 0016 10/24/17 0422  WBC 32.9* 24.8* 10.0  HGB 14.5 11.8* 10.5*  HCT 44.7 36.5 33.4*  PLT 272 221 229  MCV 93.1 93.4 94.4  MCH 30.2 30.2 29.7  MCHC 32.4 32.3 31.4  RDW 14.3 14.5 14.7  LYMPHSABS 2.2  --   --   MONOABS 2.6*  --   --   EOSABS 0.1  --   --   BASOSABS 0.1  --   --     Chemistries  Recent Labs  Lab 10/22/17 1155 10/23/17 0016 10/24/17 0422  NA 136 140 143  K 2.6* 3.0* 4.1  CL 102 108 119*  CO2 22 23 19*  GLUCOSE 159* 82 108*  BUN 11 9 5*  CREATININE 1.24* 0.91 0.74  CALCIUM 8.9 8.2* 8.2*  MG 1.6* 2.7* 1.7  AST 23  --  12*  ALT 14  --  8  ALKPHOS 152*  --  80  BILITOT 0.8  --  0.5   ------------------------------------------------------------------------------------------------------------------ No results for input(s): CHOL, HDL, LDLCALC, TRIG, CHOLHDL, LDLDIRECT in the last 72  hours.  No results found for: HGBA1C ------------------------------------------------------------------------------------------------------------------ No results for input(s): TSH, T4TOTAL, T3FREE, THYROIDAB in the last 72 hours.  Invalid input(s): FREET3 ------------------------------------------------------------------------------------------------------------------ No results for input(s): VITAMINB12, FOLATE, FERRITIN, TIBC, IRON, RETICCTPCT in the last 72 hours.  Coagulation profile No results for input(s): INR, PROTIME in the last 168 hours.  No results for input(s): DDIMER in the last 72 hours.  Cardiac Enzymes No results for input(s): CKMB, TROPONINI, MYOGLOBIN in the last 168 hours.  Invalid input(s): CK ------------------------------------------------------------------------------------------------------------------    Component Value Date/Time   BNP 181 (H) 03/08/2017 1503    Micro Results Recent Results (from the past 240 hour(s))  Blood Culture (routine x 2)     Status: None (Preliminary result)   Collection Time: 10/22/17 12:35 PM  Result Value Ref Range Status   Specimen Description BLOOD RIGHT ANTECUBITAL  Final   Special Requests   Final    BOTTLES DRAWN AEROBIC AND ANAEROBIC Blood Culture adequate volume   Culture   Final    NO GROWTH 2 DAYS Performed at [facility], [address] [place], [place] [address]    Report Status PENDING  Incomplete  C difficile quick scan w PCR reflex     Status: Abnormal   Collection Time: 10/22/17 12:41 PM  Result Value Ref Range Status   C Diff antigen POSITIVE (A) NEGATIVE Final   C Diff toxin POSITIVE (A) NEGATIVE Final   C Diff interpretation Toxin producing C. difficile detected.  Final    Comment: CRITICAL RESULT CALLED TO, READ BACK BY AND VERIFIED WITH: Bethena Midget RN 13:45 10/22/17 ([email or web address]) Performed at [facility], [place] [address], [place], [place] [address]   Gastrointestinal Panel by PCR , Stool      Status: None   Collection Time: 10/22/17 12:41 PM  Result Value Ref Range Status   Campylobacter species NOT DETECTED NOT DETECTED Final   Plesimonas shigelloides NOT DETECTED NOT DETECTED Final   Salmonella species NOT DETECTED NOT DETECTED Final   Yersinia enterocolitica NOT DETECTED NOT DETECTED Final   Vibrio species NOT DETECTED NOT DETECTED Final   Vibrio cholerae NOT DETECTED NOT DETECTED Final   Enteroaggregative E coli (EAEC) NOT DETECTED NOT DETECTED Final   Enteropathogenic E coli (EPEC) NOT DETECTED NOT DETECTED Final   Enterotoxigenic E coli (ETEC) NOT DETECTED NOT DETECTED Final   Shiga like toxin producing E coli (STEC) NOT DETECTED NOT DETECTED Final   Shigella/Enteroinvasive E coli (EIEC) NOT DETECTED NOT DETECTED Final   Cryptosporidium NOT DETECTED NOT DETECTED Final   Cyclospora cayetanensis NOT DETECTED NOT DETECTED Final   Entamoeba histolytica NOT DETECTED NOT DETECTED Final   Giardia lamblia NOT DETECTED NOT DETECTED Final   Adenovirus F40/41 NOT DETECTED NOT DETECTED Final   Astrovirus NOT DETECTED NOT DETECTED Final   Norovirus GI/GII NOT DETECTED NOT DETECTED Final   Rotavirus A NOT DETECTED NOT DETECTED  Final   Sapovirus (I, II, IV, and V) NOT DETECTED NOT DETECTED Final    Comment: Performed at [facility], [address] [place], [place] [address]  Blood Culture (routine x 2)     Status: None (Preliminary result)   Collection Time: 10/22/17  2:17 PM  Result Value Ref Range Status   Specimen Description BLOOD RIGHT FOREARM  Final   Special Requests   Final    BOTTLES DRAWN AEROBIC AND ANAEROBIC Blood Culture adequate volume   Culture   Final    NO GROWTH 2 DAYS Performed at [facility], [place] [address], [place], [place] [address]    Report Status PENDING  Incomplete  MRSA PCR Screening     Status: None   Collection Time: 10/23/17  1:44 PM  Result Value Ref Range Status   MRSA by PCR NEGATIVE NEGATIVE Final    Comment:        The  GeneXpert MRSA Assay (FDA approved for NASAL specimens only), is one component of a comprehensive MRSA colonization surveillance program. It is not intended to diagnose MRSA infection nor to guide or monitor treatment for MRSA infections. Performed at [facility], [place] [address] [place], [place] [address]     Radiology Reports Ct Abdomen Pelvis W Contrast  Result Date: 10/22/2017 CLINICAL DATA:  Generalized weakness, diarrhea, and vomiting for 2 weeks. EXAM: CT ABDOMEN AND PELVIS WITH CONTRAST TECHNIQUE: Multidetector CT imaging of the abdomen and pelvis was performed using the standard protocol following bolus administration of intravenous contrast. CONTRAST:  28mL ISOVUE-300 IOPAMIDOL (ISOVUE-300) INJECTION 61% COMPARISON:  04/25/2015 FINDINGS: Lower chest: Slight fibrosis in the lung bases. Hepatobiliary: No focal liver abnormality is seen. Status post cholecystectomy. No biliary dilatation. Pancreas: Unremarkable. No pancreatic ductal dilatation or surrounding inflammatory changes. Spleen: Normal in size without focal abnormality. Adrenals/Urinary Tract: Subcentimeter right adrenal gland nodule without change since prior study, statistically likely benign. Kidneys are symmetrical. Nephrograms are homogeneous. No hydronephrosis or hydroureter. Bladder is unremarkable. Stomach/Bowel: There is diffuse wall thickening and edema involving the colon throughout all segments and with involvement of the terminal ileum. This likely represents an infectious or inflammatory process. Consider pseudomembranous colitis, infectious colitis, or inflammatory bowel disease. With terminal ileal involvement, consider Crohn disease. No other small bowel wall thickening identified. Stomach, small bowel, and colon are not abnormally distended. Vascular/Lymphatic: No significant vascular findings are present. No enlarged abdominal or pelvic lymph nodes. Mesenteric vessels are patent. Reproductive: Status post  hysterectomy. No adnexal masses. Other: No abdominal wall hernia or abnormality. No abdominopelvic ascites. Musculoskeletal: Postoperative changes with posterior fixation of the thoracolumbar spine. Cage prosthesis over an old compression fracture at L1. No destructive bone lesions. IMPRESSION: 1. Diffuse colonic wall thickening and edema throughout all segments of the colon with involvement of the terminal ileum. This likely represents an infectious or inflammatory process. Consider pseudomembranous colitis, infectious colitis, or inflammatory bowel disease. No evidence of bowel obstruction. 2. Mild fibrosis in the lung bases. 3. Status post cholecystectomy and hysterectomy. Electronically Signed   By: Lucienne Capers M.D.   On: 10/22/2017 21:57    Time Spent in minutes  30   Lala Lund M.D on 10/24/2017 at 10:06 AM  To page go to [email or web address] - password [facility]

## 2017-10-24 NOTE — Evaluation (Signed)
Physical Therapy Evaluation Patient Details Name: Karla Foster MRN: [id number] DOB: 1955-07-08 Today's Date: 10/24/2017   History of Present Illness  Pt is a 62 y/o female admitted secondary to diarrhea. Found to have C diff and ARF. PMH includes TB, HTN, smoker, depression.   Clinical Impression  Pt admitted secondary to problem above with deficits below. Pt requiring min guard to supervision during gait secondary to mild unsteadiness. No overt LOB noted. Educated about use of AD to improve safety with mobility. Will continue to follow acutely to maximize functional mobility independence and safety.     Follow Up Recommendations Home health PT;Supervision for mobility/OOB    Equipment Recommendations  3in1 (PT)    Recommendations for Other Services       Precautions / Restrictions Precautions Precautions: Fall Restrictions Weight Bearing Restrictions: No      Mobility  Bed Mobility Overal bed mobility: Modified Independent                Transfers Overall transfer level: Needs assistance Equipment used: None Transfers: Sit to/from Stand;Stand Pivot Transfers Sit to Stand: Supervision Stand pivot transfers: Min guard       General transfer comment: Supervision for safety to stand. Min guard to perform stand pivot transfer to [facility].   Ambulation/Gait Ambulation/Gait assistance: Min Gaffer (Feet): 50 Feet Assistive device: IV Pole Gait Pattern/deviations: Step-through pattern;Decreased stride length;Trunk flexed Gait velocity: Decreased    General Gait Details: Slow, mildly unsteady gait requiring min guard to supervision. Pt reporting weakness, but reports feeling good during ambulation. Educated about use of RW at home to increase stability.   Stairs            Wheelchair Mobility    Modified Rankin (Stroke Patients Only)       Balance Overall balance assessment: Needs assistance Sitting-balance support: No upper  extremity supported;Feet supported Sitting balance-Leahy Scale: Good     Standing balance support: Single extremity supported;No upper extremity supported;During functional activity Standing balance-Leahy Scale: Fair Standing balance comment: Able to maintain static standing at sink to wash hands.                              Pertinent Vitals/Pain Pain Assessment: No/denies pain    Home Living Family/patient expects to be discharged to:: Private residence Living Arrangements: Children Available Help at Discharge: Family("most of the time" ) Type of Home: House Home Access: Stairs to enter Entrance Stairs-Rails: Psychiatric nurse of Steps: 2 Home Layout: One level Home Equipment: Environmental consultant - 2 wheels      Prior Function Level of Independence: Independent               Hand Dominance        Extremity/Trunk Assessment   Upper Extremity Assessment Upper Extremity Assessment: Generalized weakness    Lower Extremity Assessment Lower Extremity Assessment: Generalized weakness    Cervical / Trunk Assessment Cervical / Trunk Assessment: Kyphotic  Communication   Communication: No difficulties  Cognition Arousal/Alertness: Awake/alert Behavior During Therapy: WFL for tasks assessed/performed Overall Cognitive Status: No family/caregiver present to determine baseline cognitive functioning                                        General Comments General comments (skin integrity, edema, etc.): No family present     Exercises  Assessment/Plan    PT Assessment Patient needs continued PT services  PT Problem List Decreased strength;Decreased balance;Decreased mobility;Decreased knowledge of use of DME;Decreased knowledge of precautions;Decreased activity tolerance       PT Treatment Interventions DME instruction;Gait training;Functional mobility training;Therapeutic activities;Stair training;Balance training;Therapeutic  exercise;Patient/family education    PT Goals (Current goals can be found in the Care Plan section)  Acute Rehab PT Goals Patient Stated Goal: to go home  PT Goal Formulation: With patient Time For Goal Achievement: 11/07/17 Potential to Achieve Goals: Good    Frequency Min 3X/week   Barriers to discharge        Co-evaluation               AM-PAC PT "6 Clicks" Daily Activity  Outcome Measure Difficulty turning over in bed (including adjusting bedclothes, sheets and blankets)?: None Difficulty moving from lying on back to sitting on the side of the bed? : A Little Difficulty sitting down on and standing up from a chair with arms (e.g., wheelchair, bedside commode, etc,.)?: A Little Help needed moving to and from a bed to chair (including a wheelchair)?: A Little Help needed walking in hospital room?: A Little Help needed climbing 3-5 steps with a railing? : A Little 6 Click Score: 19    End of Session Equipment Utilized During Treatment: Gait belt Activity Tolerance: Patient tolerated treatment well Patient left: in chair;with call bell/phone within reach;with chair alarm set Nurse Communication: Mobility status PT Visit Diagnosis: Unsteadiness on feet (R26.81);Other abnormalities of gait and mobility (R26.89);Muscle weakness (generalized) (M62.81)    Time: 0623-7628 PT Time Calculation (min) (ACUTE ONLY): 20 min   Charges:   PT Evaluation $PT Eval Low Complexity: [place], PT, DPT  Acute Rehabilitation Services  Pager: [phone] Office: [phone]   Karla Foster 10/24/2017, 6:50 PM

## 2017-10-24 NOTE — Progress Notes (Signed)
Pharmacy note - oral vancomycin  The [facility] has ordered her discharge vancomycin prescription and it will be ready tomorrow around noon.  I will deliver this to her around 1pm on 10/25/17.    Heide Guile, PharmD, BCPS-AQ ID Clinical Pharmacist Pager [phone]

## 2017-10-25 LAB — CBC
HCT: 34 % — ABNORMAL LOW (ref 36.0–46.0)
Hemoglobin: 11.1 g/dL — ABNORMAL LOW (ref 12.0–15.0)
MCH: 30.5 pg (ref 26.0–34.0)
MCHC: 32.6 g/dL (ref 30.0–36.0)
MCV: 93.4 fL (ref 78.0–100.0)
PLATELETS: 238 10*3/uL (ref 150–400)
RBC: 3.64 MIL/uL — AB (ref 3.87–5.11)
RDW: 14.9 % (ref 11.5–15.5)
WBC: 7.1 10*3/uL (ref 4.0–10.5)

## 2017-10-25 LAB — COMPREHENSIVE METABOLIC PANEL
ALT: 8 U/L (ref 0–44)
AST: 12 U/L — ABNORMAL LOW (ref 15–41)
Albumin: 2.3 g/dL — ABNORMAL LOW (ref 3.5–5.0)
Alkaline Phosphatase: 81 U/L (ref 38–126)
Anion gap: 3 — ABNORMAL LOW (ref 5–15)
BILIRUBIN TOTAL: 0.3 mg/dL (ref 0.3–1.2)
BUN: <5 mg/dL — ABNORMAL LOW (ref 8–23)
CHLORIDE: 116 mmol/L — AB (ref 98–111)
CO2: 22 mmol/L (ref 22–32)
Calcium: 8.4 mg/dL — ABNORMAL LOW (ref 8.9–10.3)
Creatinine, Ser: 0.69 mg/dL (ref 0.44–1.00)
Glucose, Bld: 88 mg/dL (ref 70–99)
POTASSIUM: 4.7 mmol/L (ref 3.5–5.1)
Sodium: 141 mmol/L (ref 135–145)
TOTAL PROTEIN: 4.7 g/dL — AB (ref 6.5–8.1)

## 2017-10-25 LAB — TSH: TSH: 2.302 u[IU]/mL (ref 0.350–4.500)

## 2017-10-25 LAB — MAGNESIUM: MAGNESIUM: 1.7 mg/dL (ref 1.7–2.4)

## 2017-10-25 LAB — CORTISOL: Cortisol, Plasma: 13.1 ug/dL

## 2017-10-25 MED ORDER — LACTATED RINGERS IV BOLUS
500.0000 mL | Freq: Once | INTRAVENOUS | Status: AC
  Administered 2017-10-25: 500 mL via INTRAVENOUS

## 2017-10-25 MED ORDER — SODIUM CHLORIDE 0.9 % IV BOLUS
500.0000 mL | Freq: Once | INTRAVENOUS | Status: AC
  Administered 2017-10-25: 500 mL via INTRAVENOUS

## 2017-10-25 NOTE — Progress Notes (Signed)
Pt refused bed alarm. Pt educated on safety and pt verbalizes understanding.

## 2017-10-25 NOTE — Progress Notes (Signed)
@IPLOG @        PROGRESS NOTE                                                                                                                                                                                                             Patient Demographics:    Karla Foster, is a 62 y.o. female, DOB - [birth year], VZD:[id number]  Admit date - 10/22/2017   Admitting Physician Karmen Bongo, MD  Outpatient Primary MD for the patient is Vivi Barrack, MD  LOS - 3  Chief Complaint  Patient presents with  . Diarrhea       Brief Narrative  Karla Foster is a 62 y.o. female with medical history significant of TB; hyperthyroidism; HTN; HLD; and depression presenting with diarrhea.  She developed diarrhea few days ago.  Of note last week she had exposure to oral clindamycin for 7 days regarding a dental infection, she has been diagnosed with C. difficile colitis this hospital admission and admitted for further care.   Subjective:   Patient in bed, appears comfortable, denies any headache, no fever, no chest pain or pressure, no shortness of breath , no abdominal pain. No focal weakness.  Diarrhea is improving.   Assessment  & Plan :     1.  Severe C. difficile colitis with profuse diarrhea and leukocytosis.  Clinically much improved after aggressive IV fluids for hydration, still blood pressures are low requiring intermittent boluses, continue on oral vancomycin and IV Flagyl combination, continue to monitor blood pressure, moved from stepdown to medical bed, overall better, already instructed pharmacy to arrange for home oral vancomycin 10-day course.  Discharge once blood pressure stable off of IV fluids.  2.  ARF.  Due to dehydration: #1 above.  Resolved after IV fluids.  3.  Hypokalemia.  Replaced and stable.  4.  History of smoking.  Counseled to quit.  5.  Anxiety and depression.  Stable, no acute issues, not suicidal homicidal, home medications which include Cymbalta,  Neurontin, trazodone along with Klonopin are continued at home dose.    Family Communication  :  None  Code Status :  Full  Disposition Plan  : Transfer to medical bed  Consults  : None  Procedures  :    CT abdomen pelvis. 1. Diffuse colonic wall thickening and edema throughout all segments of the colon with involvement of the terminal ileum. This likely represents an infectious or inflammatory process. Consider pseudomembranous colitis, infectious colitis, or inflammatory bowel disease. No evidence of bowel obstruction.  2. Mild fibrosis in the lung bases. 3. Status post cholecystectomy and hysterectomy.  DVT Prophylaxis  :  Lovenox   Lab Results  Component Value Date   PLT 238 10/25/2017    Diet :  Diet Order            DIET SOFT Room service appropriate? Yes; Fluid consistency: Thin  Diet effective now               Inpatient Medications Scheduled Meds: . chlorhexidine  15 mL Mouth Rinse BID  . clonazePAM  0.5 mg Oral BID WC  . clonazePAM  1 mg Oral Q2000  . DULoxetine  60 mg Oral QHS  . enoxaparin (LOVENOX) injection  40 mg Subcutaneous Q24H  . feeding supplement (ENSURE ENLIVE)  237 mL Oral BID BM  . gabapentin  300 mg Oral TID  . nicotine  14 mg Transdermal Daily  . pantoprazole  40 mg Oral Daily  . pneumococcal 23 valent vaccine  0.5 mL Intramuscular Tomorrow-1000  . sodium chloride flush  3 mL Intravenous Q12H  . trazodone  300 mg Oral QHS  . vancomycin  500 mg Oral Q6H   Continuous Infusions: . lactated ringers with kcl 125 mL/hr at 10/24/17 2345  . metronidazole 500 mg (10/25/17 0531)   PRN Meds:.acetaminophen **OR** [DISCONTINUED] acetaminophen, morphine injection, ondansetron **OR** ondansetron (ZOFRAN) IV  Antibiotics  :   Anti-infectives (From admission, onward)   Start     Dose/Rate Route Frequency Ordered Stop   10/24/17 0000  vancomycin (VANCOCIN) 50 mg/mL oral solution  Status:  Discontinued     500 mg Oral Every 6 hours 10/24/17 1247  10/24/17    10/24/17 0000  vancomycin (VANCOCIN) 250 MG capsule     500 mg Oral 4 times daily 10/24/17 1259 11/03/17 2359   10/22/17 1430  vancomycin (VANCOCIN) 50 mg/mL oral solution 500 mg     500 mg Oral Every 6 hours 10/22/17 1359 11/05/17 1159   10/22/17 1400  metroNIDAZOLE (FLAGYL) IVPB 500 mg     500 mg 100 mL/hr over 60 Minutes Intravenous Every 8 hours 10/22/17 1359 11/05/17 1359          Objective:   Vitals:   10/25/17 0400 10/25/17 0415 10/25/17 0530 10/25/17 0849  BP: 107/74  98/79 113/70  Pulse: 81  81 84  Resp: 19  (!) 22 19  Temp:  98.7 F (37.1 C)  97.6 F (36.4 C)  TempSrc:  Oral  Oral  SpO2: 96%  96% 95%  Weight:      Height:        Wt Readings from Last 3 Encounters:  10/22/17 64.4 kg  08/19/17 71.9 kg  03/25/17 76.2 kg     Intake/Output Summary (Last 24 hours) at 10/25/2017 1009 Last data filed at 10/25/2017 0600 Gross per 24 hour  Intake 3135.73 ml  Output -  Net 3135.73 ml     Physical Exam  Awake Alert, Oriented X 3, No new F.N deficits, Normal affect [place].AT,PERRAL Supple Neck,No JVD, No cervical lymphadenopathy appriciated.  Symmetrical Chest wall movement, Good air movement bilaterally, CTAB RRR,No Gallops, Rubs or new Murmurs, No Parasternal Heave +ve B.Sounds, Abd Soft, No tenderness, No organomegaly appriciated, No rebound - guarding or rigidity. No Cyanosis, Clubbing or edema, No new Rash or bruise     Data Review:    CBC Recent Labs  Lab 10/22/17 1155 10/23/17 0016 10/24/17 0422 10/25/17 0442  WBC 32.9* 24.8* 10.0 7.1  HGB 14.5 11.8*  10.5* 11.1*  HCT 44.7 36.5 33.4* 34.0*  PLT 272 221 229 238  MCV 93.1 93.4 94.4 93.4  MCH 30.2 30.2 29.7 30.5  MCHC 32.4 32.3 31.4 32.6  RDW 14.3 14.5 14.7 14.9  LYMPHSABS 2.2  --   --   --   MONOABS 2.6*  --   --   --   EOSABS 0.1  --   --   --   BASOSABS 0.1  --   --   --     Chemistries  Recent Labs  Lab 10/22/17 1155 10/23/17 0016 10/24/17 0422 10/25/17 0442  NA 136 140  143 141  K 2.6* 3.0* 4.1 4.7  CL 102 108 119* 116*  CO2 22 23 19* 22  GLUCOSE 159* 82 108* 88  BUN 11 9 5* <5*  CREATININE 1.24* 0.91 0.74 0.69  CALCIUM 8.9 8.2* 8.2* 8.4*  MG 1.6* 2.7* 1.7 1.7  AST 23  --  12* 12*  ALT 14  --  8 8  ALKPHOS 152*  --  80 81  BILITOT 0.8  --  0.5 0.3   ------------------------------------------------------------------------------------------------------------------ No results for input(s): CHOL, HDL, LDLCALC, TRIG, CHOLHDL, LDLDIRECT in the last 72 hours.  No results found for: HGBA1C ------------------------------------------------------------------------------------------------------------------ Recent Labs    10/25/17 0827  TSH 2.302   ------------------------------------------------------------------------------------------------------------------ No results for input(s): VITAMINB12, FOLATE, FERRITIN, TIBC, IRON, RETICCTPCT in the last 72 hours.  Coagulation profile No results for input(s): INR, PROTIME in the last 168 hours.  No results for input(s): DDIMER in the last 72 hours.  Cardiac Enzymes No results for input(s): CKMB, TROPONINI, MYOGLOBIN in the last 168 hours.  Invalid input(s): CK ------------------------------------------------------------------------------------------------------------------    Component Value Date/Time   BNP 181 (H) 03/08/2017 1503    Micro Results Recent Results (from the past 240 hour(s))  Blood Culture (routine x 2)     Status: None (Preliminary result)   Collection Time: 10/22/17 12:35 PM  Result Value Ref Range Status   Specimen Description BLOOD RIGHT ANTECUBITAL  Final   Special Requests   Final    BOTTLES DRAWN AEROBIC AND ANAEROBIC Blood Culture adequate volume   Culture   Final    NO GROWTH 2 DAYS Performed at [facility], [address] [place], [place] [address]    Report Status PENDING  Incomplete  C difficile quick scan w PCR reflex     Status: Abnormal   Collection Time:  10/22/17 12:41 PM  Result Value Ref Range Status   C Diff antigen POSITIVE (A) NEGATIVE Final   C Diff toxin POSITIVE (A) NEGATIVE Final   C Diff interpretation Toxin producing C. difficile detected.  Final    Comment: CRITICAL RESULT CALLED TO, READ BACK BY AND VERIFIED WITH: Bethena Midget RN 13:45 10/22/17 ([email or web address]) Performed at [facility], [place] [address] [place], [place] [address]   Gastrointestinal Panel by PCR , Stool     Status: None   Collection Time: 10/22/17 12:41 PM  Result Value Ref Range Status   Campylobacter species NOT DETECTED NOT DETECTED Final   Plesimonas shigelloides NOT DETECTED NOT DETECTED Final   Salmonella species NOT DETECTED NOT DETECTED Final   Yersinia enterocolitica NOT DETECTED NOT DETECTED Final   Vibrio species NOT DETECTED NOT DETECTED Final   Vibrio cholerae NOT DETECTED NOT DETECTED Final   Enteroaggregative E coli (EAEC) NOT DETECTED NOT DETECTED Final   Enteropathogenic E coli (EPEC) NOT DETECTED NOT DETECTED Final   Enterotoxigenic E  coli (ETEC) NOT DETECTED NOT DETECTED Final   Shiga like toxin producing E coli (STEC) NOT DETECTED NOT DETECTED Final   Shigella/Enteroinvasive E coli (EIEC) NOT DETECTED NOT DETECTED Final   Cryptosporidium NOT DETECTED NOT DETECTED Final   Cyclospora cayetanensis NOT DETECTED NOT DETECTED Final   Entamoeba histolytica NOT DETECTED NOT DETECTED Final   Giardia lamblia NOT DETECTED NOT DETECTED Final   Adenovirus F40/41 NOT DETECTED NOT DETECTED Final   Astrovirus NOT DETECTED NOT DETECTED Final   Norovirus GI/GII NOT DETECTED NOT DETECTED Final   Rotavirus A NOT DETECTED NOT DETECTED Final   Sapovirus (I, II, IV, and V) NOT DETECTED NOT DETECTED Final    Comment: Performed at [facility], [address] [place], [place] [address]  Blood Culture (routine x 2)     Status: None (Preliminary result)   Collection Time: 10/22/17  2:17 PM  Result Value Ref Range Status   Specimen Description BLOOD RIGHT  FOREARM  Final   Special Requests   Final    BOTTLES DRAWN AEROBIC AND ANAEROBIC Blood Culture adequate volume   Culture   Final    NO GROWTH 2 DAYS Performed at [facility], [place] [address], [place], [place] [address]    Report Status PENDING  Incomplete  MRSA PCR Screening     Status: None   Collection Time: 10/23/17  1:44 PM  Result Value Ref Range Status   MRSA by PCR NEGATIVE NEGATIVE Final    Comment:        The GeneXpert MRSA Assay (FDA approved for NASAL specimens only), is one component of a comprehensive MRSA colonization surveillance program. It is not intended to diagnose MRSA infection nor to guide or monitor treatment for MRSA infections. Performed at [facility], [place] [address] [place], [place] [address]     Radiology Reports Ct Abdomen Pelvis W Contrast  Result Date: 10/22/2017 CLINICAL DATA:  Generalized weakness, diarrhea, and vomiting for 2 weeks. EXAM: CT ABDOMEN AND PELVIS WITH CONTRAST TECHNIQUE: Multidetector CT imaging of the abdomen and pelvis was performed using the standard protocol following bolus administration of intravenous contrast. CONTRAST:  70mL ISOVUE-300 IOPAMIDOL (ISOVUE-300) INJECTION 61% COMPARISON:  04/25/2015 FINDINGS: Lower chest: Slight fibrosis in the lung bases. Hepatobiliary: No focal liver abnormality is seen. Status post cholecystectomy. No biliary dilatation. Pancreas: Unremarkable. No pancreatic ductal dilatation or surrounding inflammatory changes. Spleen: Normal in size without focal abnormality. Adrenals/Urinary Tract: Subcentimeter right adrenal gland nodule without change since prior study, statistically likely benign. Kidneys are symmetrical. Nephrograms are homogeneous. No hydronephrosis or hydroureter. Bladder is unremarkable. Stomach/Bowel: There is diffuse wall thickening and edema involving the colon throughout all segments and with involvement of the terminal ileum. This likely represents an infectious or  inflammatory process. Consider pseudomembranous colitis, infectious colitis, or inflammatory bowel disease. With terminal ileal involvement, consider Crohn disease. No other small bowel wall thickening identified. Stomach, small bowel, and colon are not abnormally distended. Vascular/Lymphatic: No significant vascular findings are present. No enlarged abdominal or pelvic lymph nodes. Mesenteric vessels are patent. Reproductive: Status post hysterectomy. No adnexal masses. Other: No abdominal wall hernia or abnormality. No abdominopelvic ascites. Musculoskeletal: Postoperative changes with posterior fixation of the thoracolumbar spine. Cage prosthesis over an old compression fracture at L1. No destructive bone lesions. IMPRESSION: 1. Diffuse colonic wall thickening and edema throughout all segments of the colon with involvement of the terminal ileum. This likely represents an infectious or inflammatory process. Consider pseudomembranous colitis, infectious colitis,  or inflammatory bowel disease. No evidence of bowel obstruction. 2. Mild fibrosis in the lung bases. 3. Status post cholecystectomy and hysterectomy. Electronically Signed   By: Lucienne Capers M.D.   On: 10/22/2017 21:57    Time Spent in minutes  30   Lala Lund M.D on 10/25/2017 at 10:09 AM  To page go to [email or web address] - password [email or web address]

## 2017-10-25 NOTE — Progress Notes (Signed)
Physical Therapy Treatment Patient Details Name: Karla Foster MRN: [id number] DOB: [birth year] Today's Date: 10/25/2017    History of Present Illness Pt is a 62 y/o female admitted secondary to diarrhea. Found to have C diff and ARF. PMH includes TB, HTN, smoker, depression.     PT Comments    Patient requires min guard/min A for OOB mobility this session with use of RW for stability. Pt relies on at least single UE support for standing balance. Pt reports feeling weaker today compared to yesterday and agreeable to ambulation in room only. Continue to progress as tolerated.    Follow Up Recommendations  Home health PT;Supervision for mobility/OOB     Equipment Recommendations  3in1 (PT);Rolling walker with 5" wheels    Recommendations for Other Services       Precautions / Restrictions Precautions Precautions: Fall Restrictions Weight Bearing Restrictions: No    Mobility  Bed Mobility Overal bed mobility: Modified Independent             General bed mobility comments: increased time and effort  Transfers Overall transfer level: Needs assistance Equipment used: 1 person hand held assist Transfers: Sit to/from Stand Sit to Stand: Min assist         General transfer comment: assist to steady  Ambulation/Gait Ambulation/Gait assistance: Min guard Gait Distance (Feet): (~50ft in room) Assistive device: Rolling walker (2 wheeled) Gait Pattern/deviations: Step-through pattern;Decreased stride length;Trunk flexed Gait velocity: Decreased    General Gait Details: pt relies on at least single UE support; cues for safe use of AD   Stairs             Wheelchair Mobility    Modified Rankin (Stroke Patients Only)       Balance Overall balance assessment: Needs assistance Sitting-balance support: No upper extremity supported;Feet supported Sitting balance-Leahy Scale: Good     Standing balance support: Single extremity supported;During functional  activity Standing balance-Leahy Scale: Poor Standing balance comment: pt with bilat LE weakness noted at times, pt able to recover without assistance, and pt using bilat UE for support for bathing at sink                            Cognition Arousal/Alertness: Awake/alert Behavior During Therapy: WFL for tasks assessed/performed Overall Cognitive Status: No family/caregiver present to determine baseline cognitive functioning Area of Impairment: Attention;Safety/judgement;Problem solving                   Current Attention Level: Selective     Safety/Judgement: Decreased awareness of safety   Problem Solving: Requires verbal cues        Exercises      General Comments General comments (skin integrity, edema, etc.): pt reports feeling much weaker today compared to yesterday      Pertinent Vitals/Pain Pain Assessment: No/denies pain    Home Living                      Prior Function            PT Goals (current goals can now be found in the care plan section) Acute Rehab PT Goals Patient Stated Goal: to go home  PT Goal Formulation: With patient Time For Goal Achievement: 11/07/17 Potential to Achieve Goals: Good Progress towards PT goals: Progressing toward goals    Frequency    Min 3X/week      PT Plan Current plan remains appropriate  Co-evaluation              AM-PAC PT "6 Clicks" Daily Activity  Outcome Measure  Difficulty turning over in bed (including adjusting bedclothes, sheets and blankets)?: A Little Difficulty moving from lying on back to sitting on the side of the bed? : Unable Difficulty sitting down on and standing up from a chair with arms (e.g., wheelchair, bedside commode, etc,.)?: Unable Help needed moving to and from a bed to chair (including a wheelchair)?: A Little Help needed walking in hospital room?: A Little Help needed climbing 3-5 steps with a railing? : A Lot 6 Click Score: 13    End of  Session Equipment Utilized During Treatment: Gait belt Activity Tolerance: Patient limited by fatigue Patient left: with call bell/phone within reach;in bed;with nursing/sitter in room Nurse Communication: Mobility status PT Visit Diagnosis: Unsteadiness on feet (R26.81);Other abnormalities of gait and mobility (R26.89);Muscle weakness (generalized) (M62.81)     Time: 1010-1044 PT Time Calculation (min) (ACUTE ONLY): 34 min  Charges:  $Gait Training: 8-22 mins $Therapeutic Activity: 8-22 mins                     Earney Navy, PTA Pager: [phone]     Darliss Cheney 10/25/2017, 11:02 AM

## 2017-10-25 NOTE — Progress Notes (Signed)
Pharmacy note - discharge antibiotics  Vancomycin oral capsules dispensed to patients (250mg  #80 - take two capsules four times daily for 10 days).  Reinforced how to take the medication, and to take it until it is gone to prevent relapse.  Patient expressed understanding and thanks.  Heide Guile, PharmD, BCPS-AQ ID Clinical Pharmacist Pager [phone]

## 2017-10-26 ENCOUNTER — Inpatient Hospital Stay (HOSPITAL_COMMUNITY): Payer: Medicare Other

## 2017-10-26 DIAGNOSIS — A0472 Enterocolitis due to Clostridium difficile, not specified as recurrent: Principal | ICD-10-CM

## 2017-10-26 DIAGNOSIS — E875 Hyperkalemia: Secondary | ICD-10-CM

## 2017-10-26 LAB — COMPREHENSIVE METABOLIC PANEL
ALBUMIN: 2.4 g/dL — AB (ref 3.5–5.0)
ALK PHOS: 83 U/L (ref 38–126)
ALT: 7 U/L (ref 0–44)
ANION GAP: 3 — AB (ref 5–15)
AST: 13 U/L — ABNORMAL LOW (ref 15–41)
BILIRUBIN TOTAL: 0.2 mg/dL — AB (ref 0.3–1.2)
BUN: <5 mg/dL — ABNORMAL LOW (ref 8–23)
CALCIUM: 8.7 mg/dL — AB (ref 8.9–10.3)
CO2: 24 mmol/L (ref 22–32)
CREATININE: 0.89 mg/dL (ref 0.44–1.00)
Chloride: 115 mmol/L — ABNORMAL HIGH (ref 98–111)
GFR calc Af Amer: >60 mL/min (ref >60)
GFR calc non Af Amer: >60 mL/min (ref >60)
GLUCOSE: 96 mg/dL (ref 70–99)
Potassium: 5.7 mmol/L — ABNORMAL HIGH (ref 3.5–5.1)
Sodium: 142 mmol/L (ref 135–145)
TOTAL PROTEIN: 4.9 g/dL — AB (ref 6.5–8.1)

## 2017-10-26 LAB — CBC
HEMATOCRIT: 36.4 % (ref 36.0–46.0)
HEMOGLOBIN: 11.7 g/dL — AB (ref 12.0–15.0)
MCH: 30.1 pg (ref 26.0–34.0)
MCHC: 32.1 g/dL (ref 30.0–36.0)
MCV: 93.6 fL (ref 78.0–100.0)
Platelets: 257 10*3/uL (ref 150–400)
RBC: 3.89 MIL/uL (ref 3.87–5.11)
RDW: 14.8 % (ref 11.5–15.5)
WBC: 12.6 10*3/uL — ABNORMAL HIGH (ref 4.0–10.5)

## 2017-10-26 LAB — MAGNESIUM: Magnesium: 1.6 mg/dL — ABNORMAL LOW (ref 1.7–2.4)

## 2017-10-26 MED ORDER — SODIUM POLYSTYRENE SULFONATE 15 GM/60ML PO SUSP
30.0000 g | Freq: Once | ORAL | Status: AC
  Administered 2017-10-26: 30 g via ORAL
  Filled 2017-10-26: qty 120

## 2017-10-26 MED ORDER — MAGNESIUM SULFATE 2 GM/50ML IV SOLN
2.0000 g | Freq: Once | INTRAVENOUS | Status: AC
  Administered 2017-10-26: 2 g via INTRAVENOUS
  Filled 2017-10-26: qty 50

## 2017-10-26 MED ORDER — FUROSEMIDE 10 MG/ML IJ SOLN
INTRAMUSCULAR | Status: AC
  Administered 2017-10-26: 40 mg via INTRAVENOUS
  Filled 2017-10-26: qty 4

## 2017-10-26 MED ORDER — FUROSEMIDE 10 MG/ML IJ SOLN
40.0000 mg | Freq: Once | INTRAMUSCULAR | Status: AC
  Administered 2017-10-26: 40 mg via INTRAVENOUS

## 2017-10-26 NOTE — Progress Notes (Addendum)
PROGRESS NOTE    Karla Foster  MVH:[id number] DOB: 12-04-1955 DOA: 10/22/2017 PCP: Vivi Barrack, MD   Brief Narrative: Patient is a 62 year old female with past medical history of ITP, hypothyroidism, hypertension, hyperlipidemia, depression who presented with diarrhea.  She was on oral clindamycin for 7 days regarding a dental infection.  She was found to have C. difficile colitis.  Currently she is being managed for C. difficile diarrhea .  Her hospital course was remarkable for episodes of hypotension.  Assessment & Plan:   Principal Problem:   C. difficile colitis Active Problems:   Nicotine dependence with current use   Acute renal failure (ARF) (HCC)   Malnutrition of moderate degree   Hyperkalemia  Severe C. difficile colitis: Presented with profuse diarrhea and leukocytosis.  CT abdomen/pelvis showed diffuse colitis.  Clinically much improved.  Currently on IV Flagyl and oral vancomycin.  Diarrhea has slowed down.  She had 2 bowel movements today .  Plan is to discharge her home tomorrow with oral vancomycin, 10-day course.  Hyperkalemia: We will give her dose of Kayexalate.  Will check potassium level tomorrow.  Hypomagnesemia: Supplemented with magnesium.  Hypotension: Currently resolved.  She was getting IV boluses for hypotension.  Patient still feels weak.  Acute kidney injury: Secondary to dehydration.  Resolved with IV fluids.  History of smoking: Counseled to quit.  Anxiety/depression: Continue her home medications.    DVT prophylaxis: Lovenox Code Status:DNR  Family Communication: None present at the bedside Disposition Plan: Home tomorrow   Consultants: None  Procedures:None  Antimicrobials: Oral vancomycin, IV Flagyl  Subjective: Patient seen and examined the bedside this morning.  Blood pressure stable today.  Had 2 loose bowel movements this morning.  Feels weak.  Objective: Vitals:   10/26/17 0502 10/26/17 0553 10/26/17 0619 10/26/17  1052  BP: 100/73  (!) 134/55 (!) 113/51  Pulse: 82  (!) 114 92  Resp: 16 (!) 36 (!) 34   Temp: 97.6 F (36.4 C)  97.7 F (36.5 C)   TempSrc: Oral     SpO2: 95% 94% 95%   Weight:      Height:        Intake/Output Summary (Last 24 hours) at 10/26/2017 1250 Last data filed at 10/26/2017 1140 Gross per 24 hour  Intake 2848.21 ml  Output 2240 ml  Net 608.21 ml   Filed Weights   10/22/17 1144  Weight: 64.4 kg    Examination:  General exam: Appears calm and comfortable ,generalised weakness HEENT:PERRL,Oral mucosa moist, Ear/Nose normal on gross exam Respiratory system: Bilateral equal air entry, normal vesicular breath sounds, no wheezes or crackles  Cardiovascular system: S1 & S2 heard, RRR. No JVD, murmurs, rubs, gallops or clicks. No pedal edema. Gastrointestinal system: Abdomen is nondistended, soft and nontender. No organomegaly or masses felt. Normal bowel sounds heard. Central nervous system: Alert and oriented. No focal neurological deficits. Extremities: No edema, no clubbing ,no cyanosis, distal peripheral pulses palpable. Skin: No rashes, lesions or ulcers,no icterus ,no pallor MSK: Normal muscle bulk,tone ,power Psychiatry: Judgement and insight appear normal. Mood & affect appropriate.     Data Reviewed: I have personally reviewed following labs and imaging studies  CBC: Recent Labs  Lab 10/22/17 1155 10/23/17 0016 10/24/17 0422 10/25/17 0442 10/26/17 0421  WBC 32.9* 24.8* 10.0 7.1 12.6*  NEUTROABS 27.9*  --   --   --   --   HGB 14.5 11.8* 10.5* 11.1* 11.7*  HCT 44.7 36.5 33.4* 34.0* 36.4  MCV  93.1 93.4 94.4 93.4 93.6  PLT 272 221 229 238 409   Basic Metabolic Panel: Recent Labs  Lab 10/22/17 1155 10/23/17 0016 10/24/17 0422 10/25/17 0442 10/26/17 0421  NA 136 140 143 141 142  K 2.6* 3.0* 4.1 4.7 5.7*  CL 102 108 119* 116* 115*  CO2 22 23 19* 22 24  GLUCOSE 159* 82 108* 88 96  BUN 11 9 5* <5* <5*  CREATININE 1.24* 0.91 0.74 0.69 0.89  CALCIUM  8.9 8.2* 8.2* 8.4* 8.7*  MG 1.6* 2.7* 1.7 1.7 1.6*   GFR: Estimated Creatinine Clearance: 63.7 mL/min (by C-G formula based on SCr of 0.89 mg/dL). Liver Function Tests: Recent Labs  Lab 10/22/17 1155 10/24/17 0422 10/25/17 0442 10/26/17 0421  AST 23 12* 12* 13*  ALT 14 8 8 7   ALKPHOS 152* 80 81 83  BILITOT 0.8 0.5 0.3 0.2*  PROT 6.6 4.6* 4.7* 4.9*  ALBUMIN 3.1* 2.1* 2.3* 2.4*   Recent Labs  Lab 10/22/17 1155  LIPASE 22   No results for input(s): AMMONIA in the last 168 hours. Coagulation Profile: No results for input(s): INR, PROTIME in the last 168 hours. Cardiac Enzymes: No results for input(s): CKTOTAL, CKMB, CKMBINDEX, TROPONINI in the last 168 hours. BNP (last 3 results) No results for input(s): PROBNP in the last 8760 hours. HbA1C: No results for input(s): HGBA1C in the last 72 hours. CBG: No results for input(s): GLUCAP in the last 168 hours. Lipid Profile: No results for input(s): CHOL, HDL, LDLCALC, TRIG, CHOLHDL, LDLDIRECT in the last 72 hours. Thyroid Function Tests: Recent Labs    10/25/17 0827  TSH 2.302   Anemia Panel: No results for input(s): VITAMINB12, FOLATE, FERRITIN, TIBC, IRON, RETICCTPCT in the last 72 hours. Sepsis Labs: Recent Labs  Lab 10/22/17 1210  LATICACIDVEN 1.45    Recent Results (from the past 240 hour(s))  Blood Culture (routine x 2)     Status: None (Preliminary result)   Collection Time: 10/22/17 12:35 PM  Result Value Ref Range Status   Specimen Description BLOOD RIGHT ANTECUBITAL  Final   Special Requests   Final    BOTTLES DRAWN AEROBIC AND ANAEROBIC Blood Culture adequate volume   Culture   Final    NO GROWTH 4 DAYS Performed at [facility], [address], [place], [place] [address]    Report Status PENDING  Incomplete  C difficile quick scan w PCR reflex     Status: Abnormal   Collection Time: 10/22/17 12:41 PM  Result Value Ref Range Status   C Diff antigen POSITIVE (A) NEGATIVE Final   C Diff toxin  POSITIVE (A) NEGATIVE Final   C Diff interpretation Toxin producing C. difficile detected.  Final    Comment: CRITICAL RESULT CALLED TO, READ BACK BY AND VERIFIED WITH: Bethena Midget RN 13:45 10/22/17 ([email or web address]) Performed at [facility], [place] [address] [place], [place] [address]   Gastrointestinal Panel by PCR , Stool     Status: None   Collection Time: 10/22/17 12:41 PM  Result Value Ref Range Status   Campylobacter species NOT DETECTED NOT DETECTED Final   Plesimonas shigelloides NOT DETECTED NOT DETECTED Final   Salmonella species NOT DETECTED NOT DETECTED Final   Yersinia enterocolitica NOT DETECTED NOT DETECTED Final   Vibrio species NOT DETECTED NOT DETECTED Final   Vibrio cholerae NOT DETECTED NOT DETECTED Final   Enteroaggregative E coli (EAEC) NOT DETECTED NOT DETECTED Final   Enteropathogenic E coli (EPEC) NOT DETECTED  NOT DETECTED Final   Enterotoxigenic E coli (ETEC) NOT DETECTED NOT DETECTED Final   Shiga like toxin producing E coli (STEC) NOT DETECTED NOT DETECTED Final   Shigella/Enteroinvasive E coli (EIEC) NOT DETECTED NOT DETECTED Final   Cryptosporidium NOT DETECTED NOT DETECTED Final   Cyclospora cayetanensis NOT DETECTED NOT DETECTED Final   Entamoeba histolytica NOT DETECTED NOT DETECTED Final   Giardia lamblia NOT DETECTED NOT DETECTED Final   Adenovirus F40/41 NOT DETECTED NOT DETECTED Final   Astrovirus NOT DETECTED NOT DETECTED Final   Norovirus GI/GII NOT DETECTED NOT DETECTED Final   Rotavirus A NOT DETECTED NOT DETECTED Final   Sapovirus (I, II, IV, and V) NOT DETECTED NOT DETECTED Final    Comment: Performed at [facility], [place] [place], [place] [address]  Blood Culture (routine x 2)     Status: None (Preliminary result)   Collection Time: 10/22/17  2:17 PM  Result Value Ref Range Status   Specimen Description BLOOD RIGHT FOREARM  Final   Special Requests   Final    BOTTLES DRAWN AEROBIC AND ANAEROBIC Blood Culture adequate volume    Culture   Final    NO GROWTH 4 DAYS Performed at [facility], [place] [address] [place], [place] [address]    Report Status PENDING  Incomplete  MRSA PCR Screening     Status: None   Collection Time: 10/23/17  1:44 PM  Result Value Ref Range Status   MRSA by PCR NEGATIVE NEGATIVE Final    Comment:        The GeneXpert MRSA Assay (FDA approved for NASAL specimens only), is one component of a comprehensive MRSA colonization surveillance program. It is not intended to diagnose MRSA infection nor to guide or monitor treatment for MRSA infections. Performed at [facility], [place] [address] [place], [place] [address]          Radiology Studies: Dg Chest Port 1 View  Result Date: 10/26/2017 CLINICAL DATA:  Shortness of breath EXAM: PORTABLE CHEST 1 VIEW COMPARISON:  Chest radiograph 04/17/2016 FINDINGS: There is mild interstitial pulmonary edema. No focal airspace consolidation. Cardiomediastinal contours are normal. No pneumothorax or pleural effusion. IMPRESSION: Mild interstitial pulmonary edema. Electronically Signed   By: Ulyses Jarred M.D.   On: 10/26/2017 06:18        Scheduled Meds: . chlorhexidine  15 mL Mouth Rinse BID  . clonazePAM  0.5 mg Oral BID WC  . clonazePAM  1 mg Oral Q2000  . DULoxetine  60 mg Oral QHS  . enoxaparin (LOVENOX) injection  40 mg Subcutaneous Q24H  . feeding supplement (ENSURE ENLIVE)  237 mL Oral BID BM  . gabapentin  300 mg Oral TID  . nicotine  14 mg Transdermal Daily  . pantoprazole  40 mg Oral Daily  . pneumococcal 23 valent vaccine  0.5 mL Intramuscular Tomorrow-1000  . sodium chloride flush  3 mL Intravenous Q12H  . sodium polystyrene  30 g Oral Once  . trazodone  300 mg Oral QHS  . vancomycin  500 mg Oral Q6H   Continuous Infusions: . magnesium sulfate 1 - 4 g bolus IVPB    . metronidazole 500 mg (10/26/17 0643)     LOS: 4 days    Time spent: 25 mins.More than 50% of that time was spent in counseling and/or  coordination of care.      Shelly Coss, MD [facility] Pager [phone]  If 7PM-7AM, please contact night-coverage [email or web address]  Password [email or web address] 10/26/2017, 12:50 PM

## 2017-10-26 NOTE — Significant Event (Signed)
Rapid Response Event Note  Overview: Called d/t acute respiratory distress, accessory muscle use.   Time Called: 9198 Arrival Time: 0538 Event Type: Respiratory  Initial Focused Assessment: Pt laying in bed, tachypneic, labored breathing, unable to speak in complete sentences. VS: T-97.6, HR-82, BP-100/73, RR-36, 93% on 4L [place].  Lungs with RUL wheeze and scattered crackles t/o.  + 1 BLE edema present.  Interventions: PCXR-mild interstitial pulmonary edema. FiO2 increased to 6L-94% 40mg  lasix given IV AM dose of Klonopin given early per NP order  Plan of Care (if not transferred): Continue to monitor pt. Possible bipap if doesn't improve.  Call RRT if further assistance needed. Event Summary: Name of Physician Notified: Kennon Holter, NP at 0600(paged PTA RRT and RRT paged at 0600)    at          [facility], Carren Rang

## 2017-10-26 NOTE — Progress Notes (Signed)
Patient complained of lower bilateral chest pain 6/10, with dyspnea at rest and labored breathing  destating to 85% on 2L. Increased oxygen to 5L. EKG completed. Placed on telemetry. Wheezing RUL.  Notified [place], NP about patient situation. Orders placed can completed by RN. Nurse, Rapid response and Respiratory at bedside  Will continue to monitor and notify oncall MD of any changes.

## 2017-10-27 LAB — BASIC METABOLIC PANEL
Anion gap: 6 (ref 5–15)
BUN: <5 mg/dL — ABNORMAL LOW (ref 8–23)
CALCIUM: 8 mg/dL — AB (ref 8.9–10.3)
CO2: 25 mmol/L (ref 22–32)
CREATININE: 0.85 mg/dL (ref 0.44–1.00)
Chloride: 109 mmol/L (ref 98–111)
GFR calc Af Amer: >60 mL/min (ref >60)
GFR calc non Af Amer: >60 mL/min (ref >60)
Glucose, Bld: 98 mg/dL (ref 70–99)
Potassium: 3.6 mmol/L (ref 3.5–5.1)
Sodium: 140 mmol/L (ref 135–145)

## 2017-10-27 LAB — CULTURE, BLOOD (ROUTINE X 2)
CULTURE: NO GROWTH
Culture: NO GROWTH
Special Requests: ADEQUATE
Special Requests: ADEQUATE

## 2017-10-27 LAB — MAGNESIUM: Magnesium: 1.9 mg/dL (ref 1.7–2.4)

## 2017-10-27 MED ORDER — MIDODRINE HCL 5 MG PO TABS
5.0000 mg | ORAL_TABLET | Freq: Three times a day (TID) | ORAL | Status: DC
  Administered 2017-10-27 – 2017-10-28 (×4): 5 mg via ORAL
  Filled 2017-10-27 (×4): qty 1

## 2017-10-27 MED ORDER — SODIUM CHLORIDE 0.9 % IV SOLN
INTRAVENOUS | Status: DC | PRN
  Administered 2017-10-27: 250 mL via INTRAVENOUS

## 2017-10-27 NOTE — Care Management Note (Signed)
Case Management Note  Patient Details  Name: Karla Foster MRN: [id number] Date of Birth: 19-Oct-1955  Subjective/Objective:         Pt presented from home for diarrhea.  Pt uses DME walker and 3n1 at home.     Pt states she may not be able to pay copays for [facility] and will decide to move forward when they contact her.         Action/Plan: Pt denies need for DME.  Pt chooses to use [facility] for [facility] PT.  Jermaine with [facility] contacted and accepted referral. Information place on AVS.    Expected Discharge Date:  10/30/17               Expected Discharge Plan:  [place]  In-House Referral:  NA  Discharge planning Services  CM Consult  Post Acute Care Choice:  Durable Medical Equipment, Home Health Choice offered to:  Patient  DME Arranged:  [name] DME Agency:     HH Arranged:  PT [place] Agency:  [place]  Status of Service:  Completed, signed off  If discussed at [place] of Stay Meetings, dates discussed:    Additional Comments:  Claudie Leach, RN 10/27/2017, 2:55 PM

## 2017-10-27 NOTE — Progress Notes (Signed)
PROGRESS NOTE    Karla Foster  AJO:[id number] DOB: [birth year] DOA: 10/22/2017 PCP: Vivi Barrack, MD   Brief Narrative: Patient is a 62 year old female with past medical history of ITP, hypothyroidism, hypertension, hyperlipidemia, depression who presented with diarrhea.  She was on oral clindamycin for 7 days regarding a dental infection.  She was found to have C. difficile colitis.  Currently she is being managed for C. difficile diarrhea .  Her hospital course was remarkable for episodes of hypotension.  She still remains weak today.  PT evaluated her and recommended home health on discharge.  Started on midodrine for hypotension.  Assessment & Plan:   Principal Problem:   C. difficile colitis Active Problems:   Nicotine dependence with current use   Acute renal failure (ARF) (HCC)   Malnutrition of moderate degree   Hyperkalemia  Severe C. difficile colitis: Presented with profuse diarrhea and leukocytosis.  CT abdomen/pelvis showed diffuse colitis.  Clinically much improved.  Currently on IV Flagyl and oral vancomycin.  Diarrhea has slowed down.  She had 3 bowel movements today . Stools are thick this morning. Plan is to discharge her home tomorrow with oral vancomycin, 10-day course.  Hypotension: Her systolic blood pressure runs in the range of 94B and diastolic in the range of 09G.  Manual blood pressure this morning was 90/50 mmHg.  Will start on Midodrin.  We will cannot give her fluids because she developed some respiratory distress yesterday morning and chest x-ray showed mild interstitial pulmonary edema.  Hyperkalemia: S/P Kayexalate.  Potassium level normal this morning.  Hypomagnesemia: Supplemented with magnesium.  Acute kidney injury: Secondary to dehydration.  Resolved with IV fluids.  History of smoking: Counseled to quit.  Anxiety/depression: Continue her home medications.  Generalized weakness: Patient evaluated by physical therapy and recommended home  health on discharge.    DVT prophylaxis: Lovenox Code Status:DNR  Family Communication: None present at the bedside Disposition Plan: Home with home health tomorrow   Consultants: None  Procedures:None  Antimicrobials: Oral vancomycin, IV Flagyl  Subjective: Patient seen and examined the bedside this morning.  Had 3 bowel movements this morning.  Remains weak. Systolic blood pressure found to be 90/50 mmHg.  Denies any abdominal pain.  Objective: Vitals:   10/26/17 1052 10/26/17 1326 10/26/17 2307 10/27/17 0446  BP: (!) 113/51 (!) 94/48 (!) 91/45 (!) 93/50  Pulse: 92 74 84 81  Resp:  20 19 18   Temp:  97.7 F (36.5 C) 98.3 F (36.8 C) 98.3 F (36.8 C)  TempSrc:   Oral   SpO2:  98%  90%  Weight:      Height:        Intake/Output Summary (Last 24 hours) at 10/27/2017 1103 Last data filed at 10/27/2017 1000 Gross per 24 hour  Intake 2139.25 ml  Output 1200 ml  Net 939.25 ml   Filed Weights   10/22/17 1144  Weight: 64.4 kg    Examination:  General exam: Appears calm and comfortable ,generalised weakness HEENT:PERRL,Oral mucosa moist, Ear/Nose normal on gross exam Respiratory system: Bilateral equal air entry, normal vesicular breath sounds, no wheezes or crackles  Cardiovascular system: S1 & S2 heard, RRR. No JVD, murmurs, rubs, gallops or clicks. No pedal edema. Gastrointestinal system: Abdomen is nondistended, soft and nontender. No organomegaly or masses felt. Normal bowel sounds heard. Central nervous system: Alert and oriented. No focal neurological deficits. Extremities: No edema, no clubbing ,no cyanosis, distal peripheral pulses palpable. Skin: No rashes, lesions or ulcers,no icterus ,  no pallor   Data Reviewed: I have personally reviewed following labs and imaging studies  CBC: Recent Labs  Lab 10/22/17 1155 10/23/17 0016 10/24/17 0422 10/25/17 0442 10/26/17 0421  WBC 32.9* 24.8* 10.0 7.1 12.6*  NEUTROABS 27.9*  --   --   --   --   HGB 14.5 11.8*  10.5* 11.1* 11.7*  HCT 44.7 36.5 33.4* 34.0* 36.4  MCV 93.1 93.4 94.4 93.4 93.6  PLT 272 221 229 238 299   Basic Metabolic Panel: Recent Labs  Lab 10/23/17 0016 10/24/17 0422 10/25/17 0442 10/26/17 0421 10/27/17 0250  NA 140 143 141 142 140  K 3.0* 4.1 4.7 5.7* 3.6  CL 108 119* 116* 115* 109  CO2 23 19* 22 24 25   GLUCOSE 82 108* 88 96 98  BUN 9 5* <5* <5* <5*  CREATININE 0.91 0.74 0.69 0.89 0.85  CALCIUM 8.2* 8.2* 8.4* 8.7* 8.0*  MG 2.7* 1.7 1.7 1.6* 1.9   GFR: Estimated Creatinine Clearance: 66.7 mL/min (by C-G formula based on SCr of 0.85 mg/dL). Liver Function Tests: Recent Labs  Lab 10/22/17 1155 10/24/17 0422 10/25/17 0442 10/26/17 0421  AST 23 12* 12* 13*  ALT 14 8 8 7   ALKPHOS 152* 80 81 83  BILITOT 0.8 0.5 0.3 0.2*  PROT 6.6 4.6* 4.7* 4.9*  ALBUMIN 3.1* 2.1* 2.3* 2.4*   Recent Labs  Lab 10/22/17 1155  LIPASE 22   No results for input(s): AMMONIA in the last 168 hours. Coagulation Profile: No results for input(s): INR, PROTIME in the last 168 hours. Cardiac Enzymes: No results for input(s): CKTOTAL, CKMB, CKMBINDEX, TROPONINI in the last 168 hours. BNP (last 3 results) No results for input(s): PROBNP in the last 8760 hours. HbA1C: No results for input(s): HGBA1C in the last 72 hours. CBG: No results for input(s): GLUCAP in the last 168 hours. Lipid Profile: No results for input(s): CHOL, HDL, LDLCALC, TRIG, CHOLHDL, LDLDIRECT in the last 72 hours. Thyroid Function Tests: Recent Labs    10/25/17 0827  TSH 2.302   Anemia Panel: No results for input(s): VITAMINB12, FOLATE, FERRITIN, TIBC, IRON, RETICCTPCT in the last 72 hours. Sepsis Labs: Recent Labs  Lab 10/22/17 1210  LATICACIDVEN 1.45    Recent Results (from the past 240 hour(s))  Blood Culture (routine x 2)     Status: None   Collection Time: 10/22/17 12:35 PM  Result Value Ref Range Status   Specimen Description BLOOD RIGHT ANTECUBITAL  Final   Special Requests   Final    BOTTLES  DRAWN AEROBIC AND ANAEROBIC Blood Culture adequate volume   Culture   Final    NO GROWTH 5 DAYS Performed at [facility], [address], [place], [place] [address]    Report Status 10/27/2017 FINAL  Final  C difficile quick scan w PCR reflex     Status: Abnormal   Collection Time: 10/22/17 12:41 PM  Result Value Ref Range Status   C Diff antigen POSITIVE (A) NEGATIVE Final   C Diff toxin POSITIVE (A) NEGATIVE Final   C Diff interpretation Toxin producing C. difficile detected.  Final    Comment: CRITICAL RESULT CALLED TO, READ BACK BY AND VERIFIED WITH: Bethena Midget RN 13:45 10/22/17 ([email or web address]) Performed at [facility], [place] [address], [place], [place] [address]   Gastrointestinal Panel by PCR , Stool     Status: None   Collection Time: 10/22/17 12:41 PM  Result Value Ref Range Status   Campylobacter species NOT  DETECTED NOT DETECTED Final   Plesimonas shigelloides NOT DETECTED NOT DETECTED Final   Salmonella species NOT DETECTED NOT DETECTED Final   Yersinia enterocolitica NOT DETECTED NOT DETECTED Final   Vibrio species NOT DETECTED NOT DETECTED Final   Vibrio cholerae NOT DETECTED NOT DETECTED Final   Enteroaggregative E coli (EAEC) NOT DETECTED NOT DETECTED Final   Enteropathogenic E coli (EPEC) NOT DETECTED NOT DETECTED Final   Enterotoxigenic E coli (ETEC) NOT DETECTED NOT DETECTED Final   Shiga like toxin producing E coli (STEC) NOT DETECTED NOT DETECTED Final   Shigella/Enteroinvasive E coli (EIEC) NOT DETECTED NOT DETECTED Final   Cryptosporidium NOT DETECTED NOT DETECTED Final   Cyclospora cayetanensis NOT DETECTED NOT DETECTED Final   Entamoeba histolytica NOT DETECTED NOT DETECTED Final   Giardia lamblia NOT DETECTED NOT DETECTED Final   Adenovirus F40/41 NOT DETECTED NOT DETECTED Final   Astrovirus NOT DETECTED NOT DETECTED Final   Norovirus GI/GII NOT DETECTED NOT DETECTED Final   Rotavirus A NOT DETECTED NOT DETECTED Final   Sapovirus (I, II, IV, and V)  NOT DETECTED NOT DETECTED Final    Comment: Performed at [facility], [address] [place], [place] [address]  Blood Culture (routine x 2)     Status: None   Collection Time: 10/22/17  2:17 PM  Result Value Ref Range Status   Specimen Description BLOOD RIGHT FOREARM  Final   Special Requests   Final    BOTTLES DRAWN AEROBIC AND ANAEROBIC Blood Culture adequate volume   Culture   Final    NO GROWTH 5 DAYS Performed at [facility], [place] [address] [place], [place] [address]    Report Status 10/27/2017 FINAL  Final  MRSA PCR Screening     Status: None   Collection Time: 10/23/17  1:44 PM  Result Value Ref Range Status   MRSA by PCR NEGATIVE NEGATIVE Final    Comment:        The GeneXpert MRSA Assay (FDA approved for NASAL specimens only), is one component of a comprehensive MRSA colonization surveillance program. It is not intended to diagnose MRSA infection nor to guide or monitor treatment for MRSA infections. Performed at [facility], [place] [address], [place], [place] [address]          Radiology Studies: Dg Chest Port 1 View  Result Date: 10/26/2017 CLINICAL DATA:  Shortness of breath EXAM: PORTABLE CHEST 1 VIEW COMPARISON:  Chest radiograph 04/17/2016 FINDINGS: There is mild interstitial pulmonary edema. No focal airspace consolidation. Cardiomediastinal contours are normal. No pneumothorax or pleural effusion. IMPRESSION: Mild interstitial pulmonary edema. Electronically Signed   By: Ulyses Jarred M.D.   On: 10/26/2017 06:18        Scheduled Meds: . chlorhexidine  15 mL Mouth Rinse BID  . clonazePAM  0.5 mg Oral BID WC  . clonazePAM  1 mg Oral Q2000  . DULoxetine  60 mg Oral QHS  . enoxaparin (LOVENOX) injection  40 mg Subcutaneous Q24H  . feeding supplement (ENSURE ENLIVE)  237 mL Oral BID BM  . gabapentin  300 mg Oral TID  . midodrine  5 mg Oral TID WC  . nicotine  14 mg Transdermal Daily  . pantoprazole  40 mg Oral Daily  .  pneumococcal 23 valent vaccine  0.5 mL Intramuscular Tomorrow-1000  . sodium chloride flush  3 mL Intravenous Q12H  . trazodone  300 mg Oral QHS  . vancomycin  500 mg Oral Q6H  Continuous Infusions: . metronidazole 500 mg (10/27/17 0547)     LOS: 5 days    Time spent: 25 mins.More than 50% of that time was spent in counseling and/or coordination of care.      Shelly Coss, MD [facility] Pager [phone]  If 7PM-7AM, please contact night-coverage [email or web address] Password [email or web address] 10/27/2017, 11:03 AM

## 2017-10-28 DIAGNOSIS — Z23 Encounter for immunization: Secondary | ICD-10-CM | POA: Diagnosis not present

## 2017-10-28 MED ORDER — MIDODRINE HCL 5 MG PO TABS: 5.0000 mg | ORAL_TABLET | Freq: Three times a day (TID) | ORAL | 0 refills | Status: DC

## 2017-10-28 MED ORDER — VANCOMYCIN HCL 250 MG PO CAPS: 500.0000 mg | ORAL_CAPSULE | Freq: Four times a day (QID) | ORAL | 0 refills | Status: DC

## 2017-10-28 NOTE — Consult Note (Signed)
[facility] CM Primary Care Navigator  10/28/2017  Karla Foster 10/05/1955 [id number]     Met withpatientat the bedsideto identify possible discharge needs. Patientreports having"increased abdominal cramping and diarrhea" that had led to this admission. (severe C. difficile colitis, hypotension)   Patientendorses Dr.Caleb Parker with  [facility] as her primary care provider.   [facility] [facility].   Patientreportsmanagingherownmedicationsat homestraight out of the containers.   Patient states thatshehas been driving prior to admission but verbalized that her ex-husband (Jack- living close by) will be able to providetransportation to herdoctors' appointments if needed after discharge.  Patientliveswith her brother (Allen) and she looks after him. Patient states that brother can assist with her needsat home once discharge.  Anticipateddischarge planishomewith home health services per therapy recommendation. According to patient, she may not be able to pay co-pay for home health.Encouraged patient to contact insurance company ([employer]) to inquire for assistance they could provide her and she agreed.  Patientvoiced understanding to call primary care provider's office when she returns homefor a post discharge follow-upvisitwithin1- 2weeksor sooner if needs arise.Patient letter (with PCP's contact number) was provided asherreminder.   Discussed with patientaboutTHN CM services available for health management/ resourcesat home butshe denies having any further needsor concerns at thispoint and strongly denies having history of COPD ("my brother has it but not me"). She mentioned being on a patch to quit smoking. Encouraged patientto seekreferral from primary care provider to [facility] care management if  deemednecessaryand appropriatefor any services in the near future.  [facility] care management information was provided for future needs thatshemay have.  Patienthadopted and verbally agreed forEMMI calls to follow-upwith herrecovery at home.  Referral made for EMMI General calls after discharge.   For additional questions please contact:  Lorraine A. Ajel, BSN, RN-BC [facility] PRIMARY CARE Navigator Cell: [phone] 

## 2017-10-28 NOTE — Discharge Summary (Signed)
Physician Discharge Summary  Karla Foster XBM:[id number] DOB: Jan 21, 1956 DOA: 10/22/2017  PCP: Vivi Barrack, MD  Admit date: 10/22/2017 Discharge date: 10/28/2017  Admitted From: Home Disposition:  Home  Discharge Condition:Stable CODE STATUS: DNR Diet recommendation: regular  Brief/Interim Summary: Patient is a 62 year old female with past medical history of ITP, hypothyroidism, hypertension, hyperlipidemia, depression who presented with diarrhea.  She was on oral clindamycin for 7 days regarding a dental infection. She was found to have C. difficile colitis and started treatment with oral vancomycin and IV Flagyl.Marland Kitchen  Her hospital course was remarkable for episodes of hypotension.   PT evaluated her and recommended home health on discharge.  Started on midodrine for hypotension.  This morning her blood pressure is stable.  She is stable to be discharged home.  She needs to continue oral vancomycin for 7 more days to complete a course of total 14 days of treatment.  Following problems were addressed during her hospitalization:  Severe C. difficile colitis: Presented with profuse diarrhea and leukocytosis.  CT abdomen/pelvis showed diffuse colitis.  Clinically much improved. She was on IV Flagyl and oral vancomycin.  Diarrhea has slowed down.  She had 1 bowel movement today . Stools are thick this morning. Plan is to discharge her home tomorrow with oral vancomycin, total of 14-day course.  Hypotension: Her systolic blood pressure runs in the range of 02V and diastolic in the range of 25D. Started on Midodrin.  BP 107/71 this morning.  Hyperkalemia: Resolved  Hypomagnesemia: Supplemented with magnesium.  Acute kidney injury: Secondary to dehydration.  Resolved with IV fluids.  History of smoking: Counseled to quit.  Anxiety/depression: Continue her home medications.  Generalized weakness: Patient evaluated by physical therapy and recommended home health on  discharge.    Discharge Diagnoses:  Principal Problem:   C. difficile colitis Active Problems:   Nicotine dependence with current use   Acute renal failure (ARF) (HCC)   Malnutrition of moderate degree   Hyperkalemia    Discharge Instructions  Discharge Instructions    Diet - low sodium heart healthy   Complete by:  As directed    Discharge instructions   Complete by:  As directed    1)Please take prescribed medications as instructed. 2)Follow up with your PCP in a week.  Do a CBC and BMP test during the follow-up.   Increase activity slowly   Complete by:  As directed      Allergies as of 10/28/2017      Reactions   Amoxicillin Other (See Comments)   Tongue swelling      Medication List    TAKE these medications   chlorhexidine 0.12 % solution Commonly known as:  PERIDEX 15 mLs by Mouth Rinse route 2 (two) times daily. Rinse for 30 seconds   clonazePAM 1 MG tablet Commonly known as:  KLONOPIN TAKE 1/2 TABLET EVERY MORNING AND AT NOON AND 1 TABLET AT BEDTIME   DULoxetine 60 MG capsule Commonly known as:  CYMBALTA TAKE 1 CAPSULE (60 MG TOTAL) BY MOUTH AT BEDTIME. What changed:  See the new instructions.   esomeprazole 40 MG capsule Commonly known as:  NEXIUM TAKE 1 CAPSULE (40 MG TOTAL) BY MOUTH DAILY BEFORE BREAKFAST. What changed:  See the new instructions.   gabapentin 300 MG capsule Commonly known as:  NEURONTIN Take 1 capsule (300 mg total) by mouth 3 (three) times daily.   midodrine 5 MG tablet Commonly known as:  PROAMATINE Take 1 tablet (5 mg total) by mouth  3 (three) times daily with meals.   trazodone 300 MG tablet Commonly known as:  DESYREL TAKE 1 TABLET (300 MG TOTAL) BY MOUTH AT BEDTIME.   vancomycin 250 MG capsule Commonly known as:  VANCOCIN Take 2 capsules (500 mg total) by mouth 4 (four) times daily for 7 days.      Follow-up Information    [facility]   Specialty:  Home Health Services Why:   Physical therapist will contact you.  Contact information: [place] [address] [phone]        Vivi Barrack, MD. Schedule an appointment as soon as possible for a visit in 1 week(s).   Specialty:  Family Medicine Contact information: [place] [address] [phone]          Allergies  Allergen Reactions  . Amoxicillin Other (See Comments)    Tongue swelling    Consultations:  None   Procedures/Studies: Ct Abdomen Pelvis W Contrast  Result Date: 10/22/2017 CLINICAL DATA:  Generalized weakness, diarrhea, and vomiting for 2 weeks. EXAM: CT ABDOMEN AND PELVIS WITH CONTRAST TECHNIQUE: Multidetector CT imaging of the abdomen and pelvis was performed using the standard protocol following bolus administration of intravenous contrast. CONTRAST:  25mL ISOVUE-300 IOPAMIDOL (ISOVUE-300) INJECTION 61% COMPARISON:  04/25/2015 FINDINGS: Lower chest: Slight fibrosis in the lung bases. Hepatobiliary: No focal liver abnormality is seen. Status post cholecystectomy. No biliary dilatation. Pancreas: Unremarkable. No pancreatic ductal dilatation or surrounding inflammatory changes. Spleen: Normal in size without focal abnormality. Adrenals/Urinary Tract: Subcentimeter right adrenal gland nodule without change since prior study, statistically likely benign. Kidneys are symmetrical. Nephrograms are homogeneous. No hydronephrosis or hydroureter. Bladder is unremarkable. Stomach/Bowel: There is diffuse wall thickening and edema involving the colon throughout all segments and with involvement of the terminal ileum. This likely represents an infectious or inflammatory process. Consider pseudomembranous colitis, infectious colitis, or inflammatory bowel disease. With terminal ileal involvement, consider Crohn disease. No other small bowel wall thickening identified. Stomach, small bowel, and colon are not abnormally distended. Vascular/Lymphatic: No significant  vascular findings are present. No enlarged abdominal or pelvic lymph nodes. Mesenteric vessels are patent. Reproductive: Status post hysterectomy. No adnexal masses. Other: No abdominal wall hernia or abnormality. No abdominopelvic ascites. Musculoskeletal: Postoperative changes with posterior fixation of the thoracolumbar spine. Cage prosthesis over an old compression fracture at L1. No destructive bone lesions. IMPRESSION: 1. Diffuse colonic wall thickening and edema throughout all segments of the colon with involvement of the terminal ileum. This likely represents an infectious or inflammatory process. Consider pseudomembranous colitis, infectious colitis, or inflammatory bowel disease. No evidence of bowel obstruction. 2. Mild fibrosis in the lung bases. 3. Status post cholecystectomy and hysterectomy. Electronically Signed   By: Lucienne Capers M.D.   On: 10/22/2017 21:57   Dg Chest Port 1 View  Result Date: 10/26/2017 CLINICAL DATA:  Shortness of breath EXAM: PORTABLE CHEST 1 VIEW COMPARISON:  Chest radiograph 04/17/2016 FINDINGS: There is mild interstitial pulmonary edema. No focal airspace consolidation. Cardiomediastinal contours are normal. No pneumothorax or pleural effusion. IMPRESSION: Mild interstitial pulmonary edema. Electronically Signed   By: Ulyses Jarred M.D.   On: 10/26/2017 06:18      Subjective: Patient seen and examined the pressure this morning.  Remains comfortable.  Blood pressure better today.  Denies any new complaints.  Discharge Exam: Vitals:   10/27/17 2210 10/28/17 0603  BP: (!) 90/58 107/71  Pulse: 72 77  Resp:  18  Temp:  97.8 F (36.6 C)  SpO2:  92%   Vitals:   10/27/17 1447 10/27/17 2207 10/27/17 2210 10/28/17 0603  BP: 97/74 (!) 100/46 (!) 90/58 107/71  Pulse: 78 68 72 77  Resp: 16 18  18   Temp: (!) 97.4 F (36.3 C) 97.7 F (36.5 C)  97.8 F (36.6 C)  TempSrc: Oral Oral  Oral  SpO2: 96% 96%  92%  Weight:      Height:        General: Pt is  alert, awake, not in acute distress Cardiovascular: RRR, S1/S2 +, no rubs, no gallops Respiratory: CTA bilaterally, no wheezing, no rhonchi Abdominal: Soft, NT, ND, bowel sounds + Extremities: no edema, no cyanosis    The results of significant diagnostics from this hospitalization (including imaging, microbiology, ancillary and laboratory) are listed below for reference.     Microbiology: Recent Results (from the past 240 hour(s))  Blood Culture (routine x 2)     Status: None   Collection Time: 10/22/17 12:35 PM  Result Value Ref Range Status   Specimen Description BLOOD RIGHT ANTECUBITAL  Final   Special Requests   Final    BOTTLES DRAWN AEROBIC AND ANAEROBIC Blood Culture adequate volume   Culture   Final    NO GROWTH 5 DAYS Performed at [facility], [address] [place], [place] [address]    Report Status 10/27/2017 FINAL  Final  C difficile quick scan w PCR reflex     Status: Abnormal   Collection Time: 10/22/17 12:41 PM  Result Value Ref Range Status   C Diff antigen POSITIVE (A) NEGATIVE Final   C Diff toxin POSITIVE (A) NEGATIVE Final   C Diff interpretation Toxin producing C. difficile detected.  Final    Comment: CRITICAL RESULT CALLED TO, READ BACK BY AND VERIFIED WITH: Bethena Midget RN 13:45 10/22/17 ([email or web address]) Performed at [facility], [place] [address] [place], [place] [address]   Gastrointestinal Panel by PCR , Stool     Status: None   Collection Time: 10/22/17 12:41 PM  Result Value Ref Range Status   Campylobacter species NOT DETECTED NOT DETECTED Final   Plesimonas shigelloides NOT DETECTED NOT DETECTED Final   Salmonella species NOT DETECTED NOT DETECTED Final   Yersinia enterocolitica NOT DETECTED NOT DETECTED Final   Vibrio species NOT DETECTED NOT DETECTED Final   Vibrio cholerae NOT DETECTED NOT DETECTED Final   Enteroaggregative E coli (EAEC) NOT DETECTED NOT DETECTED Final   Enteropathogenic E coli (EPEC) NOT DETECTED NOT DETECTED Final    Enterotoxigenic E coli (ETEC) NOT DETECTED NOT DETECTED Final   Shiga like toxin producing E coli (STEC) NOT DETECTED NOT DETECTED Final   Shigella/Enteroinvasive E coli (EIEC) NOT DETECTED NOT DETECTED Final   Cryptosporidium NOT DETECTED NOT DETECTED Final   Cyclospora cayetanensis NOT DETECTED NOT DETECTED Final   Entamoeba histolytica NOT DETECTED NOT DETECTED Final   Giardia lamblia NOT DETECTED NOT DETECTED Final   Adenovirus F40/41 NOT DETECTED NOT DETECTED Final   Astrovirus NOT DETECTED NOT DETECTED Final   Norovirus GI/GII NOT DETECTED NOT DETECTED Final   Rotavirus A NOT DETECTED NOT DETECTED Final   Sapovirus (I, II, IV, and V) NOT DETECTED NOT DETECTED Final    Comment: Performed at [facility], [place] [place], [place] [address]  Blood Culture (routine x 2)     Status: None   Collection Time: 10/22/17  2:17 PM  Result Value Ref Range Status  Specimen Description BLOOD RIGHT FOREARM  Final   Special Requests   Final    BOTTLES DRAWN AEROBIC AND ANAEROBIC Blood Culture adequate volume   Culture   Final    NO GROWTH 5 DAYS Performed at [facility], [address] [place], [place] [address]    Report Status 10/27/2017 FINAL  Final  MRSA PCR Screening     Status: None   Collection Time: 10/23/17  1:44 PM  Result Value Ref Range Status   MRSA by PCR NEGATIVE NEGATIVE Final    Comment:        The GeneXpert MRSA Assay (FDA approved for NASAL specimens only), is one component of a comprehensive MRSA colonization surveillance program. It is not intended to diagnose MRSA infection nor to guide or monitor treatment for MRSA infections. Performed at [facility], [place] [address] [place], [place] [address]      Labs: BNP (last 3 results) Recent Labs    03/08/17 1503  BNP 998*   Basic Metabolic Panel: Recent Labs  Lab 10/23/17 0016 10/24/17 0422 10/25/17 0442 10/26/17 0421 10/27/17 0250  NA 140 143 141 142 140  K 3.0* 4.1 4.7  5.7* 3.6  CL 108 119* 116* 115* 109  CO2 23 19* 22 24 25   GLUCOSE 82 108* 88 96 98  BUN 9 5* <5* <5* <5*  CREATININE 0.91 0.74 0.69 0.89 0.85  CALCIUM 8.2* 8.2* 8.4* 8.7* 8.0*  MG 2.7* 1.7 1.7 1.6* 1.9   Liver Function Tests: Recent Labs  Lab 10/22/17 1155 10/24/17 0422 10/25/17 0442 10/26/17 0421  AST 23 12* 12* 13*  ALT 14 8 8 7   ALKPHOS 152* 80 81 83  BILITOT 0.8 0.5 0.3 0.2*  PROT 6.6 4.6* 4.7* 4.9*  ALBUMIN 3.1* 2.1* 2.3* 2.4*   Recent Labs  Lab 10/22/17 1155  LIPASE 22   No results for input(s): AMMONIA in the last 168 hours. CBC: Recent Labs  Lab 10/22/17 1155 10/23/17 0016 10/24/17 0422 10/25/17 0442 10/26/17 0421  WBC 32.9* 24.8* 10.0 7.1 12.6*  NEUTROABS 27.9*  --   --   --   --   HGB 14.5 11.8* 10.5* 11.1* 11.7*  HCT 44.7 36.5 33.4* 34.0* 36.4  MCV 93.1 93.4 94.4 93.4 93.6  PLT 272 221 229 238 257   Cardiac Enzymes: No results for input(s): CKTOTAL, CKMB, CKMBINDEX, TROPONINI in the last 168 hours. BNP: Invalid input(s): POCBNP CBG: No results for input(s): GLUCAP in the last 168 hours. D-Dimer No results for input(s): DDIMER in the last 72 hours. Hgb A1c No results for input(s): HGBA1C in the last 72 hours. Lipid Profile No results for input(s): CHOL, HDL, LDLCALC, TRIG, CHOLHDL, LDLDIRECT in the last 72 hours. Thyroid function studies No results for input(s): TSH, T4TOTAL, T3FREE, THYROIDAB in the last 72 hours.  Invalid input(s): FREET3 Anemia work up No results for input(s): VITAMINB12, FOLATE, FERRITIN, TIBC, IRON, RETICCTPCT in the last 72 hours. Urinalysis    Component Value Date/Time   COLORURINE YELLOW 04/13/2016 1427   APPEARANCEUR CLEAR 04/13/2016 1427   LABSPEC 1.020 04/13/2016 1427   PHURINE 6.0 04/13/2016 1427   GLUCOSEU NEGATIVE 04/13/2016 1427   HGBUR NEGATIVE 04/13/2016 1427   BILIRUBINUR NEGATIVE 04/13/2016 1427   KETONESUR NEGATIVE 04/13/2016 1427   PROTEINUR NEGATIVE 04/13/2016 1427   NITRITE NEGATIVE 04/13/2016  1427   LEUKOCYTESUR TRACE (A) 04/13/2016 1427   Sepsis Labs Invalid input(s): PROCALCITONIN,  WBC,  LACTICIDVEN Microbiology Recent Results (from the past  240 hour(s))  Blood Culture (routine x 2)     Status: None   Collection Time: 10/22/17 12:35 PM  Result Value Ref Range Status   Specimen Description BLOOD RIGHT ANTECUBITAL  Final   Special Requests   Final    BOTTLES DRAWN AEROBIC AND ANAEROBIC Blood Culture adequate volume   Culture   Final    NO GROWTH 5 DAYS Performed at [facility], [address] [place], [place] [address]    Report Status 10/27/2017 FINAL  Final  C difficile quick scan w PCR reflex     Status: Abnormal   Collection Time: 10/22/17 12:41 PM  Result Value Ref Range Status   C Diff antigen POSITIVE (A) NEGATIVE Final   C Diff toxin POSITIVE (A) NEGATIVE Final   C Diff interpretation Toxin producing C. difficile detected.  Final    Comment: CRITICAL RESULT CALLED TO, READ BACK BY AND VERIFIED WITH: Bethena Midget RN 13:45 10/22/17 ([email or web address]) Performed at [facility], [place] [address], [place], [place] [address]   Gastrointestinal Panel by PCR , Stool     Status: None   Collection Time: 10/22/17 12:41 PM  Result Value Ref Range Status   Campylobacter species NOT DETECTED NOT DETECTED Final   Plesimonas shigelloides NOT DETECTED NOT DETECTED Final   Salmonella species NOT DETECTED NOT DETECTED Final   Yersinia enterocolitica NOT DETECTED NOT DETECTED Final   Vibrio species NOT DETECTED NOT DETECTED Final   Vibrio cholerae NOT DETECTED NOT DETECTED Final   Enteroaggregative E coli (EAEC) NOT DETECTED NOT DETECTED Final   Enteropathogenic E coli (EPEC) NOT DETECTED NOT DETECTED Final   Enterotoxigenic E coli (ETEC) NOT DETECTED NOT DETECTED Final   Shiga like toxin producing E coli (STEC) NOT DETECTED NOT DETECTED Final   Shigella/Enteroinvasive E coli (EIEC) NOT DETECTED NOT DETECTED Final   Cryptosporidium NOT DETECTED NOT DETECTED Final   Cyclospora  cayetanensis NOT DETECTED NOT DETECTED Final   Entamoeba histolytica NOT DETECTED NOT DETECTED Final   Giardia lamblia NOT DETECTED NOT DETECTED Final   Adenovirus F40/41 NOT DETECTED NOT DETECTED Final   Astrovirus NOT DETECTED NOT DETECTED Final   Norovirus GI/GII NOT DETECTED NOT DETECTED Final   Rotavirus A NOT DETECTED NOT DETECTED Final   Sapovirus (I, II, IV, and V) NOT DETECTED NOT DETECTED Final    Comment: Performed at [facility], [place] [place], [place] [address]  Blood Culture (routine x 2)     Status: None   Collection Time: 10/22/17  2:17 PM  Result Value Ref Range Status   Specimen Description BLOOD RIGHT FOREARM  Final   Special Requests   Final    BOTTLES DRAWN AEROBIC AND ANAEROBIC Blood Culture adequate volume   Culture   Final    NO GROWTH 5 DAYS Performed at [facility], [address], [place], [place] [address]    Report Status 10/27/2017 FINAL  Final  MRSA PCR Screening     Status: None   Collection Time: 10/23/17  1:44 PM  Result Value Ref Range Status   MRSA by PCR NEGATIVE NEGATIVE Final    Comment:        The GeneXpert MRSA Assay (FDA approved for NASAL specimens only), is one component of a comprehensive MRSA colonization surveillance program. It is not intended to diagnose MRSA infection nor to guide or monitor treatment for MRSA infections. Performed at [facility], [place] [address] [place], [place]  [address]     Please note: You were cared for by a hospitalist during your hospital stay. Once you are discharged, your primary care physician will handle any further medical issues. Please note that NO REFILLS for any discharge medications will be authorized once you are discharged, as it is imperative that you return to your primary care physician (or establish a relationship with a primary care physician if you do not have one) for your post hospital discharge needs so that they can reassess your need for  medications and monitor your lab values.    Time coordinating discharge: 40 minutes  SIGNED:   Shelly Coss, MD  [facility] 10/28/2017, 10:24 AM Pager [phone]  If 7PM-7AM, please contact night-coverage [email or web address] Password [email or web address]

## 2017-10-28 NOTE — Plan of Care (Signed)
  Problem: Education: Goal: Knowledge of General Education information will improve Description: Including pain rating scale, medication(s)/side effects and non-pharmacologic comfort measures Outcome: Adequate for Discharge   Problem: Health Behavior/Discharge Planning: Goal: Ability to manage health-related needs will improve Outcome: Adequate for Discharge   Problem: Clinical Measurements: Goal: Ability to maintain clinical measurements within normal limits will improve Outcome: Adequate for Discharge Goal: Will remain free from infection Outcome: Adequate for Discharge Goal: Diagnostic test results will improve Outcome: Adequate for Discharge   

## 2017-10-28 NOTE — Progress Notes (Signed)
PT Cancellation Note  Patient Details Name: Karla Foster MRN: [id number] DOB: January 19, 1956   Cancelled Treatment:     Patient just received lunch and requests PT check back later. PT will continue to follow acutely.    Salina April, PTA Acute Rehabilitation Services Pager: [phone]   10/28/2017, 1:39 PM

## 2017-10-29 ENCOUNTER — Telehealth: Payer: Self-pay | Admitting: *Deleted

## 2017-10-29 MED ORDER — MIDODRINE HCL 5 MG PO TABS: 5.0000 mg | ORAL_TABLET | Freq: Three times a day (TID) | ORAL | 0 refills | Status: AC

## 2017-10-29 MED ORDER — VANCOMYCIN HCL 250 MG PO CAPS: 500.0000 mg | ORAL_CAPSULE | Freq: Four times a day (QID) | ORAL | 0 refills | Status: AC

## 2017-10-29 NOTE — Telephone Encounter (Addendum)
[facility] and informed her.I also explained that she needs to pick these up as soon as possible as she has already missed some doses. She agrees to call pharmacy in one hour to see if they are ready. I also updated her with Dr Ellwood Handler instructions. She agrees to call office or go to ED if anything changes or gets worse.

## 2017-10-29 NOTE — Addendum Note (Signed)
Addended by: Williemae Area on: 10/29/2017 03:06 PM   Modules accepted: Orders

## 2017-10-29 NOTE — Telephone Encounter (Signed)
Per chart review:  Admitted From: Home Disposition:  Home  Discharge Condition:Stable CODE STATUS: DNR Diet recommendation: regular  Brief/Interim Summary: Patient is a 62 year old female with past medical history of ITP, hypothyroidism, hypertension, hyperlipidemia, depression who presented with diarrhea. She was on oral clindamycin for 7 days regarding a dental infection. She was found to have C. difficile colitis and started treatment with oral vancomycin and IV Flagyl.Marland Kitchen Her hospital course was remarkable for episodes of hypotension.PT evaluated her and recommended home health on discharge. Started on midodrine for hypotension.  This morning her blood pressure is stable.  She is stable to be discharged home.  She needs to continue oral vancomycin for 7 more days to complete a course of total 14 days of treatment. _____________________________________________________________________________ Per telephone encounter: Transition Care Management Follow-up Telephone Call   Date discharged? 10/28/17   How have you been since you were released from the hospital? "weak" Patient reports 4 loose bowel movements since noon yesterday. States she is drinking fluids but has no appetite.    Do you understand why you were in the hospital? yes   Do you understand the discharge instructions? yes   Where were you discharged to? Home   Items Reviewed:  Medications reviewed: yes. Patient has not picked up the Midodrine or Vancomycin. I explained the importance of picking up her prescriptions and taking as prescribed.  [facility]   Allergies reviewed: yes  Dietary changes reviewed: yes  Referrals reviewed: yes   Functional Questionnaire:   Activities of Daily Living (ADLs):   She states they are independent in the following: ambulation, bathing and hygiene, feeding, continence, grooming, toileting and dressing States they require assistance  with the following: none. Patient states she uses a shower chair. [facility] has not contacted patient yet.    Any transportation issues/concerns?: yes. Patient relies on ex husband to drive.    Any patient concerns? no   Confirmed importance and date/time of follow-up visits scheduled yes  Provider Appointment booked with Dr [name] 11/06/17 8:00.  Confirmed with patient if condition begins to worsen call PCP or go to the ER.  Patient was given the office number and encouraged to call back with question or concerns.  : yes

## 2017-10-29 NOTE — Telephone Encounter (Signed)
Shiner with me.   If diarrhea persists or if any other symptoms worsen, needs an earlier appointment or needs to go to ED.  Algis Greenhouse. [name], MD 10/29/2017 2:46 PM

## 2017-10-31 ENCOUNTER — Telehealth: Payer: Self-pay | Admitting: Family Medicine

## 2017-10-31 NOTE — Telephone Encounter (Signed)
Noted  

## 2017-10-31 NOTE — Telephone Encounter (Signed)
Copied from [place] [id number]. Topic: Quick Communication - See Telephone Encounter >> Oct 31, 2017  2:26 PM Bea Graff, NT wrote: CRM for notification. See Telephone encounter for: 10/31/17. Jim with [facility] states pt was in the hospital and they received a referral for home health. Patient declined visit. CB#: [phone].

## 2017-11-06 ENCOUNTER — Ambulatory Visit (INDEPENDENT_AMBULATORY_CARE_PROVIDER_SITE_OTHER): Payer: Medicare Other | Admitting: Family Medicine

## 2017-11-06 ENCOUNTER — Encounter: Payer: Self-pay | Admitting: Family Medicine

## 2017-11-06 VITALS — BP 122/68 | HR 91 | Temp 97.7°F | Ht 67.0 in | Wt 152.4 lb

## 2017-11-06 DIAGNOSIS — A0472 Enterocolitis due to Clostridium difficile, not specified as recurrent: Secondary | ICD-10-CM

## 2017-11-06 DIAGNOSIS — F17200 Nicotine dependence, unspecified, uncomplicated: Secondary | ICD-10-CM | POA: Diagnosis not present

## 2017-11-06 DIAGNOSIS — R21 Rash and other nonspecific skin eruption: Secondary | ICD-10-CM

## 2017-11-06 DIAGNOSIS — I959 Hypotension, unspecified: Secondary | ICD-10-CM | POA: Diagnosis not present

## 2017-11-06 LAB — CBC
HEMATOCRIT: 36.5 % (ref 36.0–46.0)
Hemoglobin: 12.2 g/dL (ref 12.0–15.0)
MCHC: 33.5 g/dL (ref 30.0–36.0)
MCV: 91.4 fl (ref 78.0–100.0)
PLATELETS: 417 10*3/uL — AB (ref 150.0–400.0)
RBC: 4 Mil/uL (ref 3.87–5.11)
RDW: 15.7 % — ABNORMAL HIGH (ref 11.5–15.5)
WBC: 10.1 10*3/uL (ref 4.0–10.5)

## 2017-11-06 LAB — BASIC METABOLIC PANEL WITH GFR
BUN: 8 mg/dL (ref 6–23)
CO2: 28 meq/L (ref 19–32)
Calcium: 9 mg/dL (ref 8.4–10.5)
Chloride: 106 meq/L (ref 96–112)
Creatinine, Ser: 0.7 mg/dL (ref 0.40–1.20)
GFR: 89.94 mL/min
Glucose, Bld: 94 mg/dL (ref 70–99)
Potassium: 3.5 meq/L (ref 3.5–5.1)
Sodium: 141 meq/L (ref 135–145)
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
## 2017-11-06 NOTE — Patient Instructions (Signed)
It was very nice to see you today!  Please use desitin or A&D ointment for your rash.  Please finish the vancomycin.  Please decrease your midodrine to twice daily. Keep a close on your blood pressure and let me know if it drops to lower than 100/60.   You can decrease to 1 pill daily in a few days, and then stop if your blood pressure stays above 100/60.  We will check blood work today.  Please drink plenty of fluids.  Please let me know if your symptoms do not continue to improve over the next 2 weeks.  Please let me know when you are ready to send in Chantix.  Take care, Dr [name]

## 2017-11-06 NOTE — Assessment & Plan Note (Signed)
Patient interested in starting Chantix.  Advised patient to wait for another couple weeks until she is back to her baseline and I will call this in for her.

## 2017-11-06 NOTE — Assessment & Plan Note (Signed)
Normotensive today.  Has been normotensive in the past without medications.  Low blood pressure readings were exacerbated in the hospital due to dehydration.  Given that her diarrhea is resolving, and she is able to tolerate oral fluids, we will work on weaning off her [place].  Instructed patient to take 5 mg twice daily for the next 2 to 3 days, then 5 mg once daily for the 2 to 3 days after that, then off.  Discussed strict blood pressure monitoring and to let me know if her pressures drop below 100/60.

## 2017-11-06 NOTE — Assessment & Plan Note (Addendum)
Seems to be improving, however patient was not taking the appropriate dose of vancomycin.  Instructed her to take 500 mg 4 times daily until she finishes her course.  Anticipate continued improvement given that her symptoms are already resolving.  Discussed reasons for return to care.  Encouraged good oral hydration.  Check CBC and BMET.

## 2017-11-07 NOTE — Progress Notes (Signed)
Dr Marigene Ehlers interpretation of your lab work:  Your blood counts, kidney function, liver function, and electrolytes are all NORMAL. We do not need to make any adjustments to your treatment plan at this time. Please let me know if your symptoms do not continue to improve over the next couple of weeks.   If you have any additional questions, please give [place] a call or send [place] a message through [place].  Take care, Dr [name]

## 2017-12-19 ENCOUNTER — Other Ambulatory Visit: Payer: Self-pay

## 2017-12-19 ENCOUNTER — Other Ambulatory Visit: Payer: Self-pay | Admitting: Family Medicine

## 2017-12-19 MED ORDER — CLONAZEPAM 1 MG PO TABS: ORAL_TABLET | ORAL | 2 refills | Status: DC

## 2017-12-19 NOTE — Progress Notes (Signed)
Please advise 

## 2017-12-19 NOTE — Progress Notes (Signed)
Rx sent in.  Algis Greenhouse. [name], MD 12/19/2017 12:00 PM

## 2017-12-30 ENCOUNTER — Encounter: Payer: Self-pay | Admitting: Family Medicine

## 2017-12-30 ENCOUNTER — Ambulatory Visit (INDEPENDENT_AMBULATORY_CARE_PROVIDER_SITE_OTHER): Payer: Medicare Other | Admitting: Family Medicine

## 2017-12-30 ENCOUNTER — Ambulatory Visit: Payer: Medicare Other | Admitting: Family Medicine

## 2017-12-30 ENCOUNTER — Ambulatory Visit: Payer: Self-pay

## 2017-12-30 VITALS — BP 126/74 | HR 83 | Temp 98.4°F | Ht 67.0 in | Wt 142.0 lb

## 2017-12-30 DIAGNOSIS — R197 Diarrhea, unspecified: Secondary | ICD-10-CM | POA: Diagnosis not present

## 2017-12-30 DIAGNOSIS — Z634 Disappearance and death of family member: Secondary | ICD-10-CM

## 2017-12-30 MED ORDER — METRONIDAZOLE 500 MG PO TABS: 500.0000 mg | ORAL_TABLET | Freq: Three times a day (TID) | ORAL | 0 refills | Status: AC

## 2017-12-30 NOTE — Telephone Encounter (Signed)
See note

## 2017-12-30 NOTE — Telephone Encounter (Signed)
Patient is being seen in the office now.

## 2017-12-30 NOTE — Progress Notes (Signed)
   Subjective:  Karla Foster is a 62 y.o. female who presents today with a chief complaint of diarrhea.   HPI:  Diarrhea, chronic problem Started 6 days ago.  Worsened over that time.  Has occasional abdominal bloating and cramping a few times per day associated with bowel movements.  No hematochezia.  Stools described as loose and watery.  Sometimes difficult to make it to the restroom in time.  No fevers or chills.  She has tried taking Imodium without significant improvement.  No obvious or significant events.  No other obvious alleviating or aggravating factors.  Bereavement Patient's brother passed away about a week ago.  She has a good support system with her sister and other family members.  ROS: Per HPI  PMH: She reports that she has been smoking cigarettes. She has a 22.50 pack-year smoking history. She has never used smokeless tobacco. She reports that she does not drink alcohol or use drugs.  Objective:  Physical Exam: BP 126/74 (BP Location: Left Arm, Patient Position: Sitting, Cuff Size: Normal)   Pulse 83   Temp 98.4 F (36.9 C) (Oral)   Ht 5\' 7"  (1.702 m)   Wt 142 lb (64.4 kg)   SpO2 96%   BMI 22.24 kg/m   Wt Readings from Last 3 Encounters:  12/30/17 142 lb (64.4 kg)  11/06/17 152 lb 6.4 oz (69.1 kg)  10/22/17 142 lb (64.4 kg)  Gen: NAD, resting comfortably CV: RRR with no murmurs appreciated Pulm: NWOB, CTAB with no crackles, wheezes, or rhonchi GI: Normal bowel sounds present. Soft, Nontender, Nondistended.  Assessment/Plan:  Diarrhea Concern for recurrent C. difficile given her recent infection and watery stools.  Will start Flagyl 500 mg 3 times daily for the next 10 days.  Will consider checking stool sample if symptoms do not improve the next few days.  Encouraged good oral hydration.  Discussed reasons to return to care.  Discussed reasons to seek emergent care.  Bereavement Offered words of support and encouragement.  Seems to managing as best as  possible.   Karla Foster. Karla Pain, MD 12/30/2017 3:04 PM

## 2017-12-30 NOTE — Telephone Encounter (Signed)
Returned patient call who states she has has Diarrhea since last week. She states that it happens with each meal she eats and she is unable to control it when it happens. Pt states it starts as water then turns into mushy stool. She does not know if there is any blood. She states she is not dizzy but does have a dry mouth. She is not nauseated and denies pain. She is able to eat and drink. She is unsure about fever. Per protocol appointment scheduled today. Care advice read to patient. Pt verbalized understanding of all instructions. Reason for Disposition . [1] MODERATE diarrhea (e.g., 4-6 times / day more than normal) AND [2] present > 48 hours (2 days)  Answer Assessment - Initial Assessment Questions 1. DIARRHEA SEVERITY: "How bad is the diarrhea?" "How many extra stools have you had in the past 24 hours than normal?"    - NO DIARRHEA (SCALE 0)   - MILD (SCALE 1-3): Few loose or mushy BMs; increase of 1-3 stools over normal daily number of stools; mild increase in ostomy output.   -  MODERATE (SCALE 4-7): Increase of 4-6 stools daily over normal; moderate increase in ostomy output. * SEVERE (SCALE 8-10; OR 'WORST POSSIBLE'): Increase of 7 or more stools daily over normal; moderate increase in ostomy output; incontinence.     moderate 2. ONSET: "When did the diarrhea begin?"      Last week 3. BM CONSISTENCY: "How loose or watery is the diarrhea?"      Water then one plunk 4. VOMITING: "Are you also vomiting?" If so, ask: "How many times in the past 24 hours?"      no 5. ABDOMINAL PAIN: "Are you having any abdominal pain?" If yes: "What does it feel like?" (e.g., crampy, dull, intermittent, constant)      no 6. ABDOMINAL PAIN SEVERITY: If present, ask: "How bad is the pain?"  (e.g., Scale 1-10; mild, moderate, or severe)   - MILD (1-3): doesn't interfere with normal activities, abdomen soft and not tender to touch    - MODERATE (4-7): interferes with normal activities or awakens from sleep,  tender to touch    - SEVERE (8-10): excruciating pain, doubled over, unable to do any normal activities       Moderate not able to control 7. ORAL INTAKE: If vomiting, "Have you been able to drink liquids?" "How much fluids have you had in the past 24 hours?"     no 8. HYDRATION: "Any signs of dehydration?" (e.g., dry mouth [not just dry lips], too weak to stand, dizziness, new weight loss) "When did you last urinate?"     Mouth is dry 9. EXPOSURE: "Have you traveled to a foreign country recently?" "Have you been exposed to anyone with diarrhea?" "Could you have eaten any food that was spoiled?"     no 10. ANTIBIOTIC USE: "Are you taking antibiotics now or have you taken antibiotics in the past 2 months?"       no 11. OTHER SYMPTOMS: "Do you have any other symptoms?" (e.g., fever, blood in stool)       no 12. PREGNANCY: "Is there any chance you are pregnant?" "When was your last menstrual period?"       no  Protocols used: DIARRHEA-A-AH

## 2017-12-30 NOTE — Patient Instructions (Addendum)
It was very nice to see you today!  Please start the flagyl.   Let me know if worsening or not improving in the next few days.  We can check a stool sample if things do not get better.  We will keep you and your family in our thoughts and prayers during this difficult time.   Come back to see me in 6 months or sooner as needed.  Take care, Dr [name]

## 2018-01-01 ENCOUNTER — Other Ambulatory Visit: Payer: Self-pay | Admitting: Family Medicine

## 2018-01-12 ENCOUNTER — Other Ambulatory Visit: Payer: Self-pay | Admitting: Family Medicine

## 2018-01-12 DIAGNOSIS — B0229 Other postherpetic nervous system involvement: Secondary | ICD-10-CM

## 2018-01-13 ENCOUNTER — Other Ambulatory Visit: Payer: Self-pay | Admitting: Family Medicine

## 2018-01-13 DIAGNOSIS — B0229 Other postherpetic nervous system involvement: Secondary | ICD-10-CM

## 2018-01-13 NOTE — Telephone Encounter (Signed)
Copied from [place] [id number]. Topic: Quick Communication - Rx Refill/Question >> Jan 13, 2018 11:50 AM Selinda Flavin B, NT wrote: Medication: gabapentin (NEURONTIN) 300 MG capsule   Has the patient contacted their pharmacy? Yes.   (Agent: If no, request that the patient contact the pharmacy for the refill.) (Agent: If yes, when and what did the pharmacy advise?)  Preferred Pharmacy (with phone number or street name): [id number] - [place], [place]: Please be advised that RX refills may take up to 3 business days. We ask that you follow-up with your pharmacy.

## 2018-01-14 MED ORDER — GABAPENTIN 300 MG PO CAPS: 300.0000 mg | ORAL_CAPSULE | Freq: Three times a day (TID) | ORAL | 3 refills | Status: DC

## 2018-01-14 NOTE — Telephone Encounter (Signed)
See note

## 2018-01-14 NOTE — Telephone Encounter (Signed)
Requested medication (s) are due for refill today: Yes  Requested medication (s) are on the active medication list: Yes  Last refill:  09/22/16 by another provcider  Future visit scheduled: No  Notes to clinic:  Expired Rx     Requested Prescriptions  Pending Prescriptions Disp Refills   gabapentin (NEURONTIN) 300 MG capsule 90 capsule 3    Sig: Take 1 capsule (300 mg total) by mouth 3 (three) times daily.     Neurology: Anticonvulsants - gabapentin Passed - 01/14/2018 10:27 AM      Passed - Valid encounter within last 12 months    Recent Outpatient Visits          2 weeks ago Diarrhea of presumed infectious origin   [facility], Algis Greenhouse, MD   2 months ago C. difficile colitis   [facility], Algis Greenhouse, MD   4 months ago Major depressive disorder, single episode, in remission [facility])   [facility], Algis Greenhouse, MD

## 2018-02-01 ENCOUNTER — Other Ambulatory Visit: Payer: Self-pay | Admitting: Family Medicine

## 2018-02-11 ENCOUNTER — Other Ambulatory Visit: Payer: Self-pay | Admitting: Family Medicine

## 2018-02-11 DIAGNOSIS — B0229 Other postherpetic nervous system involvement: Secondary | ICD-10-CM

## 2018-02-21 ENCOUNTER — Other Ambulatory Visit: Payer: Self-pay | Admitting: Family Medicine

## 2018-03-28 ENCOUNTER — Other Ambulatory Visit: Payer: Self-pay | Admitting: Family Medicine

## 2018-03-28 NOTE — Telephone Encounter (Signed)
Rx request 

## 2018-06-30 ENCOUNTER — Other Ambulatory Visit: Payer: Self-pay

## 2018-06-30 ENCOUNTER — Telehealth: Payer: Self-pay | Admitting: Family Medicine

## 2018-06-30 NOTE — Telephone Encounter (Signed)
LM for patient to return call.  Patient will need 6 month follow up visit. (virtual visit)  CRM placed.

## 2018-06-30 NOTE — Telephone Encounter (Signed)
See note  Copied from [place] [phone] [phone]. Topic: General - Other >> Jun 30, 2018  2:39 PM Rainey Pines A wrote: Reason for [id number] stated that she would like a callback from [facility] in regards to her medication.

## 2018-07-01 ENCOUNTER — Encounter: Payer: Self-pay | Admitting: Family Medicine

## 2018-07-01 ENCOUNTER — Telehealth (INDEPENDENT_AMBULATORY_CARE_PROVIDER_SITE_OTHER): Payer: Medicare Other | Admitting: Family Medicine

## 2018-07-01 DIAGNOSIS — K219 Gastro-esophageal reflux disease without esophagitis: Secondary | ICD-10-CM | POA: Diagnosis not present

## 2018-07-01 DIAGNOSIS — F419 Anxiety disorder, unspecified: Secondary | ICD-10-CM | POA: Diagnosis not present

## 2018-07-01 DIAGNOSIS — F17210 Nicotine dependence, cigarettes, uncomplicated: Secondary | ICD-10-CM | POA: Diagnosis not present

## 2018-07-01 DIAGNOSIS — F325 Major depressive disorder, single episode, in full remission: Secondary | ICD-10-CM | POA: Diagnosis not present

## 2018-07-01 DIAGNOSIS — S32010D Wedge compression fracture of first lumbar vertebra, subsequent encounter for fracture with routine healing: Secondary | ICD-10-CM

## 2018-07-01 DIAGNOSIS — F17200 Nicotine dependence, unspecified, uncomplicated: Secondary | ICD-10-CM

## 2018-07-01 DIAGNOSIS — B0229 Other postherpetic nervous system involvement: Secondary | ICD-10-CM

## 2018-07-01 DIAGNOSIS — J449 Chronic obstructive pulmonary disease, unspecified: Secondary | ICD-10-CM

## 2018-07-01 DIAGNOSIS — K5900 Constipation, unspecified: Secondary | ICD-10-CM | POA: Insufficient documentation

## 2018-07-01 MED ORDER — TRAZODONE HCL 300 MG PO TABS: 300.0000 mg | ORAL_TABLET | Freq: Every day | ORAL | 3 refills | Status: DC

## 2018-07-01 MED ORDER — ESOMEPRAZOLE MAGNESIUM 40 MG PO CPDR: 40.0000 mg | DELAYED_RELEASE_CAPSULE | Freq: Every day | ORAL | 3 refills | Status: DC

## 2018-07-01 MED ORDER — VARENICLINE TARTRATE 1 MG PO TABS: 1.0000 mg | ORAL_TABLET | Freq: Two times a day (BID) | ORAL | 0 refills | Status: DC

## 2018-07-01 MED ORDER — VARENICLINE TARTRATE 0.5 MG X 11 & 1 MG X 42 PO MISC: ORAL | 0 refills | Status: DC

## 2018-07-01 MED ORDER — LINACLOTIDE 145 MCG PO CAPS: 145.0000 ug | ORAL_CAPSULE | Freq: Every day | ORAL | 5 refills | Status: DC

## 2018-07-01 MED ORDER — CLONAZEPAM 1 MG PO TABS: ORAL_TABLET | ORAL | 2 refills | Status: DC

## 2018-07-01 MED ORDER — DULOXETINE HCL 60 MG PO CPEP: 60.0000 mg | ORAL_CAPSULE | Freq: Every day | ORAL | 3 refills | Status: DC

## 2018-07-01 NOTE — Assessment & Plan Note (Signed)
Stable.  Continue Cymbalta 60 mg daily, trazodone 300 mg daily, and Klonopin 3 times daily as needed.

## 2018-07-01 NOTE — Assessment & Plan Note (Signed)
Stable.  No signs of acute flare.  She is working on smoking cessation which should help.

## 2018-07-01 NOTE — Telephone Encounter (Signed)
Patient has been scheduled for an appointment.

## 2018-07-01 NOTE — Assessment & Plan Note (Signed)
Stable.  Continue Cymbalta 60 mg daily, trazodone 300 mg daily.

## 2018-07-01 NOTE — Assessment & Plan Note (Signed)
Patient was asked about her tobacco use today and was strongly advised to quit. Patient is currently contemplative. We reviewed treatment options to assist her quit smoking including NRT, Chantix, and Bupropion. Will start chantix. Discussed possible side effects. Follow up at next office visit.   Total time spent counseling approximately 3 minutes.

## 2018-07-01 NOTE — Progress Notes (Signed)
    Chief Complaint:  Karla Foster is a 63 y.o. female who presents today for a virtual office visit with a chief complaint of depression follow up.   Assessment/Plan:  Major depressive disorder, single episode, in remission ([place]) Stable.  Continue Cymbalta 60 mg daily, trazodone 300 mg daily.  GERD (gastroesophageal reflux disease) Stable.  Continue Nexium 40 mg daily.  Chronic back Foster with Lumbar compression fracture and radiculopathy ([facility]) Stable.  No red flags.  Continue Cymbalta 60 mg daily and gabapentin 300 mg 3 times daily.  COPD (chronic obstructive pulmonary disease) ([facility]) Stable.  No signs of acute flare.  She is working on smoking cessation which should help.  Anxiety Stable.  Continue Cymbalta 60 mg daily, trazodone 300 mg daily, and Klonopin 3 times daily as needed.  Nicotine dependence with current use Patient was asked about her tobacco use today and was strongly advised to quit. Patient is currently contemplative. We reviewed treatment options to assist her quit smoking including NRT, Chantix, and Bupropion. Will start chantix. Discussed possible side effects. Follow up at next office visit.   Total time spent counseling approximately 3 minutes.    Constipation No red flags. Will restart linzess. Has not done well with miralax in the past.      Subjective:  HPI:   Her chronic conditions are outlined below:  # Depression / Anxiety  - On cymbalta 60mg  daily, trazodone 300mg  daily, and klonopin 0.5-1mg  tid. Tolerating these well without obvious side effects - ROS: No reported SI or HI  # GERD - On nexium 40mg  daily and tolerating well  # Chronic Back Foster - On gabapentin 300mg  three times daily and tolerating well - ROS: No reported bowel or bladder incontinence  # Constipation - Has been on linzess in the past which has worked well - Has tried miralax in the past which has not worked.   # Nicotine Dependence - Smokes about 2 packs per day.  Would like to stop. Interested in starting medications to assist with cessation.   ROS: Per HPI  PMH: She reports that she has been smoking cigarettes. She has a 22.50 pack-year smoking history. She has never used smokeless tobacco. She reports that she does not drink alcohol or use drugs.      Objective/Observations  Physical Exam: Gen: NAD, resting comfortably Pulm: Normal work of breathing Neuro: Grossly normal, moves all extremities Psych: Normal affect and thought content  Virtual Visit via Video   I connected with Karla Foster on 07/01/18 at  2:00 PM EDT by a video enabled telemedicine application and verified that I am speaking with the correct person using two identifiers. I discussed the limitations of evaluation and management by telemedicine and the availability of in person appointments. The patient expressed understanding and agreed to proceed.   Patient location: Home Provider location: [place] participating in the virtual visit: Myself and Patient     Karla Foster. Karla Pain, MD 07/01/2018 3:44 PM

## 2018-07-01 NOTE — Assessment & Plan Note (Signed)
Stable. Continue Nexium 40mg daily

## 2018-07-01 NOTE — Assessment & Plan Note (Addendum)
No red flags. Will restart linzess. Has not done well with miralax in the past.

## 2018-07-01 NOTE — Assessment & Plan Note (Signed)
Stable.  No red flags.  Continue Cymbalta 60 mg daily and gabapentin 300 mg 3 times daily.

## 2018-10-06 ENCOUNTER — Other Ambulatory Visit: Payer: Self-pay | Admitting: Family Medicine

## 2018-10-07 NOTE — Telephone Encounter (Signed)
Last OV:  07/01/2018 Next OV:  None Last Fill:  09/04/2018  Please advise.

## 2018-10-24 ENCOUNTER — Telehealth: Payer: Self-pay | Admitting: Family Medicine

## 2018-10-24 NOTE — Telephone Encounter (Signed)
I called and left a voicemail for the patient to coe pick up her paperwork for her parking placecard. No need to route, for documentation purposes only. Paperwork is up front.

## 2018-11-12 ENCOUNTER — Other Ambulatory Visit: Payer: Self-pay | Admitting: Family Medicine

## 2018-11-12 DIAGNOSIS — B0229 Other postherpetic nervous system involvement: Secondary | ICD-10-CM

## 2019-01-05 ENCOUNTER — Other Ambulatory Visit: Payer: Self-pay | Admitting: Family Medicine

## 2019-01-09 ENCOUNTER — Telehealth: Payer: Self-pay | Admitting: Family Medicine

## 2019-01-09 NOTE — Telephone Encounter (Signed)
I left a message asking the patient to call and schedule Medicare AWV with Courtney ([facility]).  If patient calls back, please schedule Medicare Wellness Visit (initial) at next available opening.  VDM (Dee-Dee) 

## 2019-01-12 NOTE — Telephone Encounter (Signed)
PT has been sch for AWV ON 01-19-2019

## 2019-01-14 ENCOUNTER — Other Ambulatory Visit: Payer: Self-pay | Admitting: Family Medicine

## 2019-01-14 NOTE — Telephone Encounter (Signed)
Rx request 

## 2019-01-19 ENCOUNTER — Ambulatory Visit (INDEPENDENT_AMBULATORY_CARE_PROVIDER_SITE_OTHER): Payer: Medicare Other

## 2019-01-19 VITALS — BP 108/62 | HR 80 | Temp 97.2°F | Ht 67.0 in | Wt 157.2 lb

## 2019-01-19 DIAGNOSIS — Z0000 Encounter for general adult medical examination without abnormal findings: Secondary | ICD-10-CM
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
## 2019-01-19 NOTE — Patient Instructions (Signed)
Karla Foster , Thank you for taking time to come for your Medicare Wellness Visit. I appreciate your ongoing commitment to your health goals. Please review the following plan we discussed and let me know if I can assist you in the future.   Screening recommendations/referrals: Colorectal Screening: up to date; last 04/2015  Mammogram: recommended Bone Density: recommended   Vision and Dental Exams: Recommended annual ophthalmology exams for early detection of glaucoma and other disorders of the eye Recommended annual dental exams for proper oral hygiene  Vaccinations: Influenza vaccine:  recommended this fall either at PCP office or through your local pharmacy  Pneumococcal vaccine: recommended  Tdap vaccine: up to date; last 07/11/09  Shingles vaccine: Please call your insurance company to determine your out of pocket expense for the Shingrix vaccine. You may receive this vaccine at your local pharmacy.  Advanced directives: Advance directives discussed with you today. I have provided a copy for you to complete at home and have notarized. Once this is complete please bring a copy in to our office so we can scan it into your chart.  Goals: Recommend to drink at least 6-8 8oz glasses of water per day and consume a balanced diet rich in fresh fruits and vegetables.   Next appointment: Please schedule your Annual Wellness Visit with your Nurse Health Advisor in one year.  Preventive Care 40-64 Years, Female Preventive care refers to lifestyle choices and visits with your health care provider that can promote health and wellness. What does preventive care include?  A yearly physical exam. This is also called an annual well check.  Dental exams once or twice a year.  Routine eye exams. Ask your health care provider how often you should have your eyes checked.  Personal lifestyle choices, including:  Daily care of your teeth and gums.  Regular physical activity.  Eating a healthy  diet.  Avoiding tobacco and drug use.  Limiting alcohol use.  Practicing safe sex.  Taking low-dose aspirin daily starting at age 19 if recommended by your health care provider.  Taking vitamin and mineral supplements as recommended by your health care provider. What happens during an annual well check? The services and screenings done by your health care provider during your annual well check will depend on your age, overall health, lifestyle risk factors, and family history of disease. Counseling  Your health care provider may ask you questions about your:  Alcohol use.  Tobacco use.  Drug use.  Emotional well-being.  Home and relationship well-being.  Sexual activity.  Eating habits.  Work and work Statistician.  Method of birth control.  Menstrual cycle.  Pregnancy history. Screening  You may have the following tests or measurements:  Height, weight, and BMI.  Blood pressure.  Lipid and cholesterol levels. These may be checked every 5 years, or more frequently if you are over 88 years old.  Skin check.  Lung cancer screening. You may have this screening every year starting at age 73 if you have a 30-pack-year history of smoking and currently smoke or have quit within the past 15 years.  Fecal occult blood test (FOBT) of the stool. You may have this test every year starting at age 44.  Flexible sigmoidoscopy or colonoscopy. You may have a sigmoidoscopy every 5 years or a colonoscopy every 10 years starting at age 31.  Hepatitis C blood test.  Hepatitis B blood test.  Sexually transmitted disease (STD) testing.  Diabetes screening. This is done by checking your blood  sugar (glucose) after you have not eaten for a while (fasting). You may have this done every 1-3 years.  Mammogram. This may be done every 1-2 years. Talk to your health care provider about when you should start having regular mammograms. This may depend on whether you have a family history  of breast cancer.  BRCA-related cancer screening. This may be done if you have a family history of breast, ovarian, tubal, or peritoneal cancers.  Pelvic exam and Pap test. This may be done every 3 years starting at age 18. Starting at age 62, this may be done every 5 years if you have a Pap test in combination with an HPV test.  Bone density scan. This is done to screen for osteoporosis. You may have this scan if you are at high risk for osteoporosis. Discuss your test results, treatment options, and if necessary, the need for more tests with your health care provider. Vaccines  Your health care provider may recommend certain vaccines, such as:  Influenza vaccine. This is recommended every year.  Tetanus, diphtheria, and acellular pertussis (Tdap, Td) vaccine. You may need a Td booster every 10 years.  Zoster vaccine. You may need this after age 55.  Pneumococcal 13-valent conjugate (PCV13) vaccine. You may need this if you have certain conditions and were not previously vaccinated.  Pneumococcal polysaccharide (PPSV23) vaccine. You may need one or two doses if you smoke cigarettes or if you have certain conditions. Talk to your health care provider about which screenings and vaccines you need and how often you need them. This information is not intended to replace advice given to you by your health care provider. Make sure you discuss any questions you have with your health care provider. Document Released: 03/04/2015 Document Revised: 10/26/2015 Document Reviewed: 12/07/2014 Elsevier Interactive Patient Education  2017 [place] Prevention in the Home Falls can cause injuries. They can happen to people of all ages. There are many things you can do to make your home safe and to help prevent falls. What can I do on the outside of my home?  Regularly fix the edges of walkways and driveways and fix any cracks.  Remove anything that might make you trip as you walk through  a door, such as a raised step or threshold.  Trim any bushes or trees on the path to your home.  Use bright outdoor lighting.  Clear any walking paths of anything that might make someone trip, such as rocks or tools.  Regularly check to see if handrails are loose or broken. Make sure that both sides of any steps have handrails.  Any raised decks and porches should have guardrails on the edges.  Have any leaves, snow, or ice cleared regularly.  Use sand or salt on walking paths during winter.  Clean up any spills in your garage right away. This includes oil or grease spills. What can I do in the bathroom?  Use night lights.  Install grab bars by the toilet and in the tub and shower. Do not use towel bars as grab bars.  Use non-skid mats or decals in the tub or shower.  If you need to sit down in the shower, use a plastic, non-slip stool.  Keep the floor dry. Clean up any water that spills on the floor as soon as it happens.  Remove soap buildup in the tub or shower regularly.  Attach bath mats securely with double-sided non-slip rug tape.  Do not have  throw rugs and other things on the floor that can make you trip. What can I do in the bedroom?  Use night lights.  Make sure that you have a light by your bed that is easy to reach.  Do not use any sheets or blankets that are too big for your bed. They should not hang down onto the floor.  Have a firm chair that has side arms. You can use this for support while you get dressed.  Do not have throw rugs and other things on the floor that can make you trip. What can I do in the kitchen?  Clean up any spills right away.  Avoid walking on wet floors.  Keep items that you use a lot in easy-to-reach places.  If you need to reach something above you, use a strong step stool that has a grab bar.  Keep electrical cords out of the way.  Do not use floor polish or wax that makes floors slippery. If you must use wax, use  non-skid floor wax.  Do not have throw rugs and other things on the floor that can make you trip. What can I do with my stairs?  Do not leave any items on the stairs.  Make sure that there are handrails on both sides of the stairs and use them. Fix handrails that are broken or loose. Make sure that handrails are as long as the stairways.  Check any carpeting to make sure that it is firmly attached to the stairs. Fix any carpet that is loose or worn.  Avoid having throw rugs at the top or bottom of the stairs. If you do have throw rugs, attach them to the floor with carpet tape.  Make sure that you have a light switch at the top of the stairs and the bottom of the stairs. If you do not have them, ask someone to add them for you. What else can I do to help prevent falls?  Wear shoes that:  Do not have high heels.  Have rubber bottoms.  Are comfortable and fit you well.  Are closed at the toe. Do not wear sandals.  If you use a stepladder:  Make sure that it is fully opened. Do not climb a closed stepladder.  Make sure that both sides of the stepladder are locked into place.  Ask someone to hold it for you, if possible.  Clearly mark and make sure that you can see:  Any grab bars or handrails.  First and last steps.  Where the edge of each step is.  Use tools that help you move around (mobility aids) if they are needed. These include:  Canes.  Walkers.  Scooters.  Crutches.  Turn on the lights when you go into a dark area. Replace any light bulbs as soon as they burn out.  Set up your furniture so you have a clear path. Avoid moving your furniture around.  If any of your floors are uneven, fix them.  If there are any pets around you, be aware of where they are.  Review your medicines with your doctor. Some medicines can make you feel dizzy. This can increase your chance of falling. Ask your doctor what other things that you can do to help prevent falls. This  information is not intended to replace advice given to you by your health care provider. Make sure you discuss any questions you have with your health care provider. Document Released: 12/02/2008 Document Revised: 07/14/2015 Document Reviewed:  03/12/2014 Elsevier Interactive Patient Education  2017 [employer].

## 2019-01-19 NOTE — Progress Notes (Signed)
I have personally reviewed the Medicare Annual Wellness Visit and agree with the assessment and plan.  Karla Foster. [name], MD 01/19/2019 4:05 PM

## 2019-01-19 NOTE — Telephone Encounter (Signed)
30 day supply sent. Patient needs appointment for further refills.

## 2019-01-29 ENCOUNTER — Encounter: Payer: Self-pay | Admitting: Family Medicine

## 2019-01-29 ENCOUNTER — Ambulatory Visit (INDEPENDENT_AMBULATORY_CARE_PROVIDER_SITE_OTHER): Payer: Medicare Other | Admitting: Family Medicine

## 2019-01-29 VITALS — BP 124/75 | HR 74 | Temp 98.9°F | Ht 67.0 in | Wt 159.0 lb

## 2019-01-29 DIAGNOSIS — R079 Chest pain, unspecified: Secondary | ICD-10-CM

## 2019-01-29 NOTE — Patient Instructions (Signed)
It was very nice to see you today!  We will get blood work and a chest xray.   Please go to the ED if your symptoms worsen.  Please try the inhaler.   Take care, Dr [name]  Please try these tips to maintain a healthy lifestyle:   Eat at least 3 REAL meals and 1-2 snacks per day.  Aim for no more than 5 hours between eating.  If you eat breakfast, please do so within one hour of getting up.    Obtain twice as many fruits/vegetables as protein or carbohydrate foods for both lunch and dinner. (Half of each meal should be fruits/vegetables, one quarter protein, and one quarter starchy carbs)   Cut down on sweet beverages. This includes juice, soda, and sweet tea.    Exercise at least 150 minutes every week.

## 2019-01-29 NOTE — Progress Notes (Signed)
   Chief Complaint:  Karla Foster is a 63 y.o. female who presents today with a chief complaint of chest pain.   Assessment/Plan:  Chest Pain Very broad differential.  EKG today does not have any acute ischemic findings.  Given chronicity of symptoms, doubt that this represents ACS.  Differential includes COPD flare, CHF, or primary pulmonary pathology.  Patient does not want to go to the ED. Will check CBC, C met, TSH.  Check D-dimer to rule out PE.  Will check chest x-ray.  If above is negative, would consider further evaluation with chest CT.  May need echo to rule out CHF.  Discussed reasons return to care and seek emergent care.  We will give sample of budesonide/formoterol today to treat any underlying COPD component    Subjective:  HPI:  Chest Pain  Started a few weeks ago.  Located in the lower chest.  No clear precipitating events.  Worse with certain motions and activities.  Comes and goes throughout the day.  She has some shortness of breath when lying flat on her back.  A lot more fatigue.  A lot of cough.  Has tried Tylenol which helps with the pain.  No other treatments tried.  ROS: Per HPI  PMH: She reports that she has been smoking cigarettes. She has a 45.00 pack-year smoking history. She has never used smokeless tobacco. She reports that she does not drink alcohol or use drugs.      Objective:  Physical Exam: BP 124/75   Pulse 74   Temp 98.9 F (37.2 C)   Ht '5\' 7"'$  (1.702 m)   Wt 159 lb (72.1 kg)   SpO2 92%   BMI 24.90 kg/m   Wt Readings from Last 3 Encounters:  01/29/19 159 lb (72.1 kg)  01/19/19 157 lb 3.2 oz (71.3 kg)  12/30/17 142 lb (64.4 kg)  Gen: NAD, resting comfortably CV: Regular rate and rhythm with no murmurs appreciated Pulm: Minimally increased work of breathing.  Good airflow throughout all lung fields slightly diminished at bases.  No crackles or wheezing. GI: Normal bowel sounds present. Soft, Nontender, Nondistended. MSK: 1+ pitting edema  to shins bilaterally. Skin: Warm, dry Neuro: Grossly normal, moves all extremities Psych: Normal affect and thought content  No results found for this or any previous visit (from the past 24 hour(s)).      Algis Greenhouse. [name], MD 01/29/2019 4:09 PM

## 2019-01-30 LAB — CBC
HCT: 41.4 % (ref 36.0–46.0)
Hemoglobin: 13.8 g/dL (ref 12.0–15.0)
MCHC: 33.2 g/dL (ref 30.0–36.0)
MCV: 93.9 fl (ref 78.0–100.0)
Platelets: 291 10*3/uL (ref 150.0–400.0)
RBC: 4.41 Mil/uL (ref 3.87–5.11)
RDW: 14.1 % (ref 11.5–15.5)
WBC: 10.8 10*3/uL — ABNORMAL HIGH (ref 4.0–10.5)

## 2019-01-30 LAB — COMPREHENSIVE METABOLIC PANEL
ALT: 9 U/L (ref 0–35)
AST: 22 U/L (ref 0–37)
Albumin: 4.1 g/dL (ref 3.5–5.2)
Alkaline Phosphatase: 86 U/L (ref 39–117)
BUN: 11 mg/dL (ref 6–23)
CO2: 28 mEq/L (ref 19–32)
Calcium: 9.3 mg/dL (ref 8.4–10.5)
Chloride: 103 mEq/L (ref 96–112)
Creatinine, Ser: 0.89 mg/dL (ref 0.40–1.20)
GFR: 63.89 mL/min (ref >60.00)
Glucose, Bld: 91 mg/dL (ref 70–99)
Potassium: 4.5 mEq/L (ref 3.5–5.1)
Sodium: 137 mEq/L (ref 135–145)
Total Bilirubin: 0.3 mg/dL (ref 0.2–1.2)
Total Protein: 7.2 g/dL (ref 6.0–8.3)

## 2019-01-30 LAB — D-DIMER, QUANTITATIVE: D-Dimer, Quant: 0.43 mcg/mL FEU (ref <0.50)

## 2019-01-30 LAB — TSH: TSH: 2.6 u[IU]/mL (ref 0.35–4.50)

## 2019-01-30 NOTE — Progress Notes (Signed)
Please inform patient of the following:  White blood cell counts elevated, but all of her other labs are normal. Would like to get chest xray ASAP as we have discussed.  Would like for her to get it today if possible.

## 2019-02-02 ENCOUNTER — Other Ambulatory Visit: Payer: Medicare Other

## 2019-02-02 ENCOUNTER — Other Ambulatory Visit: Payer: Self-pay

## 2019-02-02 ENCOUNTER — Ambulatory Visit (INDEPENDENT_AMBULATORY_CARE_PROVIDER_SITE_OTHER): Payer: Medicare Other

## 2019-02-02 DIAGNOSIS — R0602 Shortness of breath: Secondary | ICD-10-CM | POA: Diagnosis not present

## 2019-02-02 DIAGNOSIS — R079 Chest pain, unspecified: Secondary | ICD-10-CM

## 2019-02-03 NOTE — Progress Notes (Signed)
Please inform patient of the following:  Good news - xray shows no pneumonia or other possible causes for her symptoms. I would like to get an echocardiogram to make sure that her heart is pumping ok. Please place order for echocardiogram.

## 2019-02-06 ENCOUNTER — Other Ambulatory Visit: Payer: Self-pay

## 2019-02-09 ENCOUNTER — Encounter: Payer: Self-pay | Admitting: Family Medicine

## 2019-02-09 ENCOUNTER — Ambulatory Visit (INDEPENDENT_AMBULATORY_CARE_PROVIDER_SITE_OTHER): Payer: Medicare Other | Admitting: Family Medicine

## 2019-02-09 VITALS — BP 122/70 | HR 68 | Temp 97.6°F | Ht 67.0 in | Wt 161.0 lb

## 2019-02-09 DIAGNOSIS — J449 Chronic obstructive pulmonary disease, unspecified: Secondary | ICD-10-CM

## 2019-02-09 DIAGNOSIS — R21 Rash and other nonspecific skin eruption: Secondary | ICD-10-CM

## 2019-02-09 MED ORDER — DULERA 200-5 MCG/ACT IN AERO: 2.0000 | INHALATION_SPRAY | Freq: Two times a day (BID) | RESPIRATORY_TRACT | 1 refills | Status: DC

## 2019-02-09 NOTE — Patient Instructions (Signed)
It was very nice to see you today!  Please continue with the inhaler.  I will send a new one and your pharmacy.  Please check in with me in a few weeks to let me know how you are doing.  Take care, Dr [name]  Please try these tips to maintain a healthy lifestyle:   Eat at least 3 REAL meals and 1-2 snacks per day.  Aim for no more than 5 hours between eating.  If you eat breakfast, please do so within one hour of getting up.    Each meal should contain half fruits/vegetables, one quarter protein, and one quarter carbs (no bigger than a computer mouse)   Cut down on sweet beverages. This includes juice, soda, and sweet tea.     Drink at least 1 glass of water with each meal and aim for at least 8 glasses per day   Exercise at least 150 minutes every week.

## 2019-02-09 NOTE — Assessment & Plan Note (Signed)
Will start Dulera.  Follow-up in a few weeks.

## 2019-02-09 NOTE — Progress Notes (Signed)
   Chief Complaint:  Karla Foster is a 63 y.o. female who presents today with a chief complaint of chest pain follow up .   Assessment/Plan:  COPD (chronic obstructive pulmonary disease) ([place]) Will start Dulera.  Follow-up in a few weeks.  Chest Pain Likely due to COPD.  Reassuring that symptoms are improving with treatment for COPD.  Given that symptoms are improving, we will continue treatment for her COPD for another couple of weeks.  If symptoms do not continue to improve will need chest CT.  Rash Consistent with xerosis cutis.  Recommended emollient and topical cortisone cream as needed.    Subjective:  HPI:  Chest Pain Patient seen 11 days ago for this.  Had work-up including CBC, C met, TSH, D-dimer, and chest x-ray all of which were essentially negative.  Fortunately, her symptoms have improved significantly since our last visit.  Still has occasional chest pain that is random.  Shortness of breath has significantly improved.  Her leg swelling is also significantly improved.  She was given a sample of Symbicort 11 days ago which she thinks is worked well.  No reported side effects.  She has also had a rash on her left lower extremity for the past several days.  Thinks that she got bit by something.  Nothing tried.  Area is red and itchy.  ROS: Per HPI  PMH: She reports that she has been smoking cigarettes. She has a 45.00 pack-year smoking history. She has never used smokeless tobacco. She reports that she does not drink alcohol or use drugs.      Objective:  Physical Exam: BP 122/70 (BP Location: Left Arm, Patient Position: Sitting, Cuff Size: Normal)   Pulse 68   Temp 97.6 F (36.4 C) (Temporal)   Ht 5' 7" (1.702 m)   Wt 161 lb (73 kg)   SpO2 96%   BMI 25.22 kg/m    Wt Readings from Last 3 Encounters:  02/09/19 161 lb (73 kg)  01/29/19 159 lb (72.1 kg)  01/19/19 157 lb 3.2 oz (71.3 kg)  Gen: NAD, resting comfortably CV: Regular rate and rhythm with no  murmurs appreciated Pulm: Normal work of breathing, clear to auscultation bilaterally with no crackles, wheezes, or rhonchi Skin: Erythematous rash on left lower extremity.  Xerosis cutis noted as well.     Algis Greenhouse. [name], MD 02/09/2019 10:21 AM

## 2019-02-17 ENCOUNTER — Other Ambulatory Visit: Payer: Self-pay | Admitting: Family Medicine

## 2019-02-17 NOTE — Telephone Encounter (Signed)
Please advise 

## 2019-03-23 ENCOUNTER — Other Ambulatory Visit: Payer: Self-pay | Admitting: Family Medicine

## 2019-03-24 NOTE — Telephone Encounter (Signed)
Pt requesting refill on Clonazepam. Last OV 01/2019.

## 2019-07-18 ENCOUNTER — Other Ambulatory Visit: Payer: Self-pay | Admitting: Family Medicine

## 2019-08-15 ENCOUNTER — Other Ambulatory Visit: Payer: Self-pay | Admitting: Family Medicine

## 2019-08-15 DIAGNOSIS — B0229 Other postherpetic nervous system involvement: Secondary | ICD-10-CM

## 2019-08-19 ENCOUNTER — Other Ambulatory Visit: Payer: Self-pay

## 2019-08-19 ENCOUNTER — Ambulatory Visit (INDEPENDENT_AMBULATORY_CARE_PROVIDER_SITE_OTHER): Payer: Medicare Other | Admitting: Family Medicine

## 2019-08-19 ENCOUNTER — Encounter: Payer: Self-pay | Admitting: Family Medicine

## 2019-08-19 VITALS — BP 130/78 | HR 79 | Temp 98.0°F | Ht 67.0 in | Wt 176.0 lb

## 2019-08-19 DIAGNOSIS — K219 Gastro-esophageal reflux disease without esophagitis: Secondary | ICD-10-CM

## 2019-08-19 DIAGNOSIS — J449 Chronic obstructive pulmonary disease, unspecified: Secondary | ICD-10-CM

## 2019-08-19 DIAGNOSIS — E785 Hyperlipidemia, unspecified: Secondary | ICD-10-CM | POA: Diagnosis not present

## 2019-08-19 DIAGNOSIS — F325 Major depressive disorder, single episode, in full remission: Secondary | ICD-10-CM | POA: Diagnosis not present

## 2019-08-19 DIAGNOSIS — F419 Anxiety disorder, unspecified: Secondary | ICD-10-CM

## 2019-08-19 DIAGNOSIS — K5900 Constipation, unspecified: Secondary | ICD-10-CM

## 2019-08-19 DIAGNOSIS — B0229 Other postherpetic nervous system involvement: Secondary | ICD-10-CM | POA: Diagnosis not present

## 2019-08-19 DIAGNOSIS — S32010D Wedge compression fracture of first lumbar vertebra, subsequent encounter for fracture with routine healing: Secondary | ICD-10-CM

## 2019-08-19 MED ORDER — LINACLOTIDE 145 MCG PO CAPS: ORAL_CAPSULE | ORAL | 3 refills | Status: DC

## 2019-08-19 MED ORDER — DULERA 200-5 MCG/ACT IN AERO: 2.0000 | INHALATION_SPRAY | Freq: Two times a day (BID) | RESPIRATORY_TRACT | 1 refills | Status: DC

## 2019-08-19 MED ORDER — DULOXETINE HCL 60 MG PO CPEP: 60.0000 mg | ORAL_CAPSULE | Freq: Every day | ORAL | 3 refills | Status: DC

## 2019-08-19 MED ORDER — CLONAZEPAM 1 MG PO TABS: ORAL_TABLET | ORAL | 5 refills | Status: DC

## 2019-08-19 MED ORDER — GABAPENTIN 300 MG PO CAPS: ORAL_CAPSULE | ORAL | 3 refills | Status: DC

## 2019-08-19 MED ORDER — ESOMEPRAZOLE MAGNESIUM 40 MG PO CPDR: 40.0000 mg | DELAYED_RELEASE_CAPSULE | Freq: Every day | ORAL | 3 refills | Status: DC

## 2019-08-19 MED ORDER — TRAZODONE HCL 300 MG PO TABS: 300.0000 mg | ORAL_TABLET | Freq: Every day | ORAL | 3 refills | Status: DC

## 2019-08-19 NOTE — Assessment & Plan Note (Signed)
Stable.  Refill Cymbalta and gabapentin today.

## 2019-08-19 NOTE — Assessment & Plan Note (Signed)
Stable.  Linzess refilled.

## 2019-08-19 NOTE — Assessment & Plan Note (Signed)
Stable.  Continue Dulera.  No signs of flare today.

## 2019-08-19 NOTE — Assessment & Plan Note (Signed)
Stable.  Continue Cymbalta 60 mg daily and trazodone 300 mg daily.

## 2019-08-19 NOTE — Assessment & Plan Note (Signed)
Stable.  Nexium refilled.

## 2019-08-19 NOTE — Progress Notes (Signed)
   Karla Foster is a 64 y.o. female who presents today for an office visit.  Assessment/Plan:  Chronic Problems Addressed Today: Major depressive disorder, single episode, in remission ([facility]) Stable.  Continue Cymbalta 60 mg daily and trazodone 300 mg daily.  Anxiety Stable.  Continue Cymbalta 60 mg daily, trazodone 300 mg daily, Klonopin as needed.  We will send in refills today.  Constipation Stable.  Linzess refilled.  GERD (gastroesophageal reflux disease) Stable.  Nexium refilled.  COPD (chronic obstructive pulmonary disease) ([facility]) Stable.  Continue Dulera.  No signs of flare today.  Chronic back pain with Lumbar compression fracture and radiculopathy ([facility]) Stable.  Refill Cymbalta and gabapentin today.     Subjective:  HPI:   We will bit more cramping in the legs over the last couple months.  No obvious causes.  She is otherwise doing well.  She has had some increased stress.  She has been taking care of her ex-husband after he broke his hip.   See A/p for status of chronic conditions.        Objective:  Physical Exam: BP 130/78   Pulse 79   Temp 98 F (36.7 C)   Ht 5\' 7"  (1.702 m)   Wt 176 lb (79.8 kg)   SpO2 96%   BMI 27.57 kg/m   Gen: No acute distress, resting comfortably CV: Regular rate and rhythm with no murmurs appreciated Pulm: Normal work of breathing, clear to auscultation bilaterally with no crackles, wheezes, or rhonchi Neuro: Grossly normal, moves all extremities Psych: Normal affect and thought content      Karla Foster M. [name], MD 08/19/2019 10:50 AM

## 2019-08-19 NOTE — Assessment & Plan Note (Signed)
Stable.  Continue Cymbalta 60 mg daily, trazodone 300 mg daily, Klonopin as needed.  We will send in refills today.

## 2019-08-19 NOTE — Patient Instructions (Signed)
It was very nice to see you today!  Please try getting more fluids.  I will refill your medications today.  Come back in 6 months for your annual checkup with blood work.  Please come back to see me sooner if needed.  Take care, Dr [name]  Please try these tips to maintain a healthy lifestyle:   Eat at least 3 REAL meals and 1-2 snacks per day.  Aim for no more than 5 hours between eating.  If you eat breakfast, please do so within one hour of getting up.    Each meal should contain half fruits/vegetables, one quarter protein, and one quarter carbs (no bigger than a computer mouse)   Cut down on sweet beverages. This includes juice, soda, and sweet tea.     Drink at least 1 glass of water with each meal and aim for at least 8 glasses per day   Exercise at least 150 minutes every week.

## 2019-09-07 ENCOUNTER — Other Ambulatory Visit: Payer: Self-pay | Admitting: Family Medicine

## 2019-09-07 DIAGNOSIS — Z1231 Encounter for screening mammogram for malignant neoplasm of breast: Secondary | ICD-10-CM

## 2019-09-17 ENCOUNTER — Ambulatory Visit: Payer: Medicare Other

## 2019-09-22 ENCOUNTER — Ambulatory Visit: Payer: Medicare Other

## 2019-10-02 ENCOUNTER — Ambulatory Visit
Admission: RE | Admit: 2019-10-02 | Discharge: 2019-10-02 | Disposition: A | Discharge status: Home / Self Care | Payer: Medicare Other | Source: Ambulatory Visit | Attending: Family Medicine | Admitting: Family Medicine

## 2019-10-02 ENCOUNTER — Other Ambulatory Visit: Payer: Self-pay

## 2019-10-02 DIAGNOSIS — Z1231 Encounter for screening mammogram for malignant neoplasm of breast: Secondary | ICD-10-CM

## 2019-10-30 ENCOUNTER — Other Ambulatory Visit: Payer: Self-pay | Admitting: Family Medicine

## 2020-01-04 ENCOUNTER — Other Ambulatory Visit: Payer: Self-pay | Admitting: Family Medicine

## 2020-02-17 ENCOUNTER — Other Ambulatory Visit: Payer: Self-pay

## 2020-02-17 ENCOUNTER — Other Ambulatory Visit: Payer: Self-pay | Admitting: Family Medicine

## 2020-02-17 NOTE — Telephone Encounter (Signed)
LAST APPOINTMENT DATE: 01/04/2020   NEXT APPOINTMENT DATE: 02/22/2020    LAST REFILL: 08/19/2019  QTY: 60 REF5

## 2020-02-18 ENCOUNTER — Encounter: Payer: Medicare Other | Admitting: Family Medicine

## 2020-02-18 ENCOUNTER — Other Ambulatory Visit: Payer: Self-pay

## 2020-02-18 NOTE — Telephone Encounter (Signed)
. °  LAST APPOINTMENT DATE: 02/17/2020   NEXT APPOINTMENT DATE:@1 /04/2020  MEDICATION:clonazePAM (KLONOPIN) 1 MG tablet  PHARMACY:[id number] - [place], [place] - [address]   OTHER COMMENTS:    Okay for refill?  Please advise

## 2020-02-22 ENCOUNTER — Telehealth: Payer: Self-pay

## 2020-02-22 ENCOUNTER — Encounter: Payer: Medicare Other | Admitting: Family Medicine

## 2020-02-22 MED ORDER — CLONAZEPAM 1 MG PO TABS: ORAL_TABLET | ORAL | 5 refills | Status: DC

## 2020-02-22 NOTE — Telephone Encounter (Signed)
Rx already sent in.  Katina Degree. Jimmey Ralph, MD 02/22/2020 12:13 PM

## 2020-02-22 NOTE — Telephone Encounter (Signed)
MEDICATION: klonopin 1 MG  PHARMACY: [facility] [address]  Comments: Pt had CPE today but has been running a fever since Friday. Rescheduled CPE for 03/10/2020  **Let patient know to contact pharmacy at the end of the day to make sure medication is ready. **  ** Please notify patient to allow 48-72 hours to process**  **Encourage patient to contact the pharmacy for refills or they can request refills through [facility]**

## 2020-02-22 NOTE — Telephone Encounter (Signed)
Rx has been sent  

## 2020-02-29 ENCOUNTER — Ambulatory Visit (INDEPENDENT_AMBULATORY_CARE_PROVIDER_SITE_OTHER): Payer: Medicare Other

## 2020-02-29 DIAGNOSIS — Z0000 Encounter for general adult medical examination without abnormal findings: Secondary | ICD-10-CM | POA: Diagnosis not present

## 2020-02-29 NOTE — Patient Instructions (Signed)
Karla Foster , Thank you for taking time to come for your Medicare Wellness Visit. I appreciate your ongoing commitment to your health goals. Please review the following plan we discussed and let me know if I can assist you in the future.   Screening recommendations/referrals: Colonoscopy: Done 04/20/15 Mammogram: Done 10/02/19 Recommended yearly ophthalmology/optometry visit for glaucoma screening and checkup Recommended yearly dental visit for hygiene and checkup  Vaccinations: Influenza vaccine: Done 10/16/19 Up to date Pneumococcal vaccine: Up to date Tdap vaccine: Up to date Shingles vaccine: Shingrix discussed. Please contact your pharmacy for coverage information.   Covid-19: Completed 09/25/19 & 10/16/19  Advanced directives: Advance directive discussed with you today. Even though you declined this today please call our office should you change your mind and we can give you the proper paperwork for you to fill out.  Conditions/risks identified: Lose weight   Next appointment: Follow up in one year for your annual wellness visit.   Preventive Care 40-64 Years, Female Preventive care refers to lifestyle choices and visits with your health care provider that can promote health and wellness. What does preventive care include?  A yearly physical exam. This is also called an annual well check.  Dental exams once or twice a year.  Routine eye exams. Ask your health care provider how often you should have your eyes checked.  Personal lifestyle choices, including:  Daily care of your teeth and gums.  Regular physical activity.  Eating a healthy diet.  Avoiding tobacco and drug use.  Limiting alcohol use.  Practicing safe sex.  Taking low-dose aspirin daily starting at age 63.  Taking vitamin and mineral supplements as recommended by your health care provider. What happens during an annual well check? The services and screenings done by your health care provider during your  annual well check will depend on your age, overall health, lifestyle risk factors, and family history of disease. Counseling  Your health care provider may ask you questions about your:  Alcohol use.  Tobacco use.  Drug use.  Emotional well-being.  Home and relationship well-being.  Sexual activity.  Eating habits.  Work and work Statistician.  Method of birth control.  Menstrual cycle.  Pregnancy history. Screening  You may have the following tests or measurements:  Height, weight, and BMI.  Blood pressure.  Lipid and cholesterol levels. These may be checked every 5 years, or more frequently if you are over 73 years old.  Skin check.  Lung cancer screening. You may have this screening every year starting at age 82 if you have a 30-pack-year history of smoking and currently smoke or have quit within the past 15 years.  Fecal occult blood test (FOBT) of the stool. You may have this test every year starting at age 85.  Flexible sigmoidoscopy or colonoscopy. You may have a sigmoidoscopy every 5 years or a colonoscopy every 10 years starting at age 46.  Hepatitis C blood test.  Hepatitis B blood test.  Sexually transmitted disease (STD) testing.  Diabetes screening. This is done by checking your blood sugar (glucose) after you have not eaten for a while (fasting). You may have this done every 1-3 years.  Mammogram. This may be done every 1-2 years. Talk to your health care provider about when you should start having regular mammograms. This may depend on whether you have a family history of breast cancer.  BRCA-related cancer screening. This may be done if you have a family history of breast, ovarian, tubal, or peritoneal  cancers.  Pelvic exam and Pap test. This may be done every 3 years starting at age 21. Starting at age 30, this may be done every 5 years if you have a Pap test in combination with an HPV test.  Bone density scan. This is done to screen for  osteoporosis. You may have this scan if you are at high risk for osteoporosis. Discuss your test results, treatment options, and if necessary, the need for more tests with your health care provider. Vaccines  Your health care provider may recommend certain vaccines, such as:  Influenza vaccine. This is recommended every year.  Tetanus, diphtheria, and acellular pertussis (Tdap, Td) vaccine. You may need a Td booster every 10 years.  Zoster vaccine. You may need this after age 60.  Pneumococcal 13-valent conjugate (PCV13) vaccine. You may need this if you have certain conditions and were not previously vaccinated.  Pneumococcal polysaccharide (PPSV23) vaccine. You may need one or two doses if you smoke cigarettes or if you have certain conditions. Talk to your health care provider about which screenings and vaccines you need and how often you need them. This information is not intended to replace advice given to you by your health care provider. Make sure you discuss any questions you have with your health care provider. Document Released: 03/04/2015 Document Revised: 10/26/2015 Document Reviewed: 12/07/2014 Elsevier Interactive Patient Education  2017 [employer]    Fall Prevention in the Home Falls can cause injuries. They can happen to people of all ages. There are many things you can do to make your home safe and to help prevent falls. What can I do on the outside of my home?  Regularly fix the edges of walkways and driveways and fix any cracks.  Remove anything that might make you trip as you walk through a door, such as a raised step or threshold.  Trim any bushes or trees on the path to your home.  Use bright outdoor lighting.  Clear any walking paths of anything that might make someone trip, such as rocks or tools.  Regularly check to see if handrails are loose or broken. Make sure that both sides of any steps have handrails.  Any raised decks and porches should have  guardrails on the edges.  Have any leaves, snow, or ice cleared regularly.  Use sand or salt on walking paths during winter.  Clean up any spills in your garage right away. This includes oil or grease spills. What can I do in the bathroom?  Use night lights.  Install grab bars by the toilet and in the tub and shower. Do not use towel bars as grab bars.  Use non-skid mats or decals in the tub or shower.  If you need to sit down in the shower, use a plastic, non-slip stool.  Keep the floor dry. Clean up any water that spills on the floor as soon as it happens.  Remove soap buildup in the tub or shower regularly.  Attach bath mats securely with double-sided non-slip rug tape.  Do not have throw rugs and other things on the floor that can make you trip. What can I do in the bedroom?  Use night lights.  Make sure that you have a light by your bed that is easy to reach.  Do not use any sheets or blankets that are too big for your bed. They should not hang down onto the floor.  Have a firm chair that has side arms. You can use   this for support while you get dressed.  Do not have throw rugs and other things on the floor that can make you trip. What can I do in the kitchen?  Clean up any spills right away.  Avoid walking on wet floors.  Keep items that you use a lot in easy-to-reach places.  If you need to reach something above you, use a strong step stool that has a grab bar.  Keep electrical cords out of the way.  Do not use floor polish or wax that makes floors slippery. If you must use wax, use non-skid floor wax.  Do not have throw rugs and other things on the floor that can make you trip. What can I do with my stairs?  Do not leave any items on the stairs.  Make sure that there are handrails on both sides of the stairs and use them. Fix handrails that are broken or loose. Make sure that handrails are as long as the stairways.  Check any carpeting to make sure that  it is firmly attached to the stairs. Fix any carpet that is loose or worn.  Avoid having throw rugs at the top or bottom of the stairs. If you do have throw rugs, attach them to the floor with carpet tape.  Make sure that you have a light switch at the top of the stairs and the bottom of the stairs. If you do not have them, ask someone to add them for you. What else can I do to help prevent falls?  Wear shoes that:  Do not have high heels.  Have rubber bottoms.  Are comfortable and fit you well.  Are closed at the toe. Do not wear sandals.  If you use a stepladder:  Make sure that it is fully opened. Do not climb a closed stepladder.  Make sure that both sides of the stepladder are locked into place.  Ask someone to hold it for you, if possible.  Clearly mark and make sure that you can see:  Any grab bars or handrails.  First and last steps.  Where the edge of each step is.  Use tools that help you move around (mobility aids) if they are needed. These include:  Canes.  Walkers.  Scooters.  Crutches.  Turn on the lights when you go into a dark area. Replace any light bulbs as soon as they burn out.  Set up your furniture so you have a clear path. Avoid moving your furniture around.  If any of your floors are uneven, fix them.  If there are any pets around you, be aware of where they are.  Review your medicines with your doctor. Some medicines can make you feel dizzy. This can increase your chance of falling. Ask your doctor what other things that you can do to help prevent falls. This information is not intended to replace advice given to you by your health care provider. Make sure you discuss any questions you have with your health care provider. Document Released: 12/02/2008 Document Revised: 07/14/2015 Document Reviewed: 03/12/2014 Elsevier Interactive Patient Education  2017 [employer]   
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
## 2020-03-10 ENCOUNTER — Encounter: Payer: Medicare Other | Admitting: Family Medicine

## 2020-04-22 ENCOUNTER — Ambulatory Visit: Payer: Medicare Other | Admitting: Physician Assistant

## 2020-04-22 DIAGNOSIS — Z0289 Encounter for other administrative examinations: Secondary | ICD-10-CM

## 2020-04-23 ENCOUNTER — Encounter: Payer: Self-pay | Admitting: Family Medicine

## 2020-04-23 ENCOUNTER — Telehealth: Payer: Medicare Other | Admitting: Family Medicine

## 2020-04-23 NOTE — Progress Notes (Signed)
Attempted to contact patient for her virtual visit today.  She was unable to connect via our telehealth platform.  I attempted to call patient multiple times however was sent to voicemail.  Our RMA was able to reach patient and reschedule her for early next week.  Patient was not seen or evaluated today.  Algis Greenhouse. [name], MD 04/23/2020 10:43 AM

## 2020-04-25 ENCOUNTER — Ambulatory Visit (INDEPENDENT_AMBULATORY_CARE_PROVIDER_SITE_OTHER): Payer: Medicare Other | Admitting: Family Medicine

## 2020-04-25 ENCOUNTER — Other Ambulatory Visit: Payer: Self-pay

## 2020-04-25 ENCOUNTER — Encounter: Payer: Self-pay | Admitting: Family Medicine

## 2020-04-25 VITALS — BP 114/73 | HR 72 | Temp 97.9°F | Ht 67.0 in | Wt 181.0 lb

## 2020-04-25 DIAGNOSIS — R202 Paresthesia of skin: Secondary | ICD-10-CM

## 2020-04-25 DIAGNOSIS — S32010D Wedge compression fracture of first lumbar vertebra, subsequent encounter for fracture with routine healing: Secondary | ICD-10-CM

## 2020-04-25 LAB — COMPREHENSIVE METABOLIC PANEL
ALT: 6 U/L (ref 0–35)
AST: 16 U/L (ref 0–37)
Albumin: 3.8 g/dL (ref 3.5–5.2)
Alkaline Phosphatase: 106 U/L (ref 39–117)
BUN: 7 mg/dL (ref 6–23)
CO2: 30 mEq/L (ref 19–32)
Calcium: 9.2 mg/dL (ref 8.4–10.5)
Chloride: 102 mEq/L (ref 96–112)
Creatinine, Ser: 0.8 mg/dL (ref 0.40–1.20)
GFR: 77.51 mL/min (ref >60.00)
Glucose, Bld: 86 mg/dL (ref 70–99)
Potassium: 4.7 mEq/L (ref 3.5–5.1)
Sodium: 136 mEq/L (ref 135–145)
Total Bilirubin: 0.4 mg/dL (ref 0.2–1.2)
Total Protein: 6.8 g/dL (ref 6.0–8.3)

## 2020-04-25 LAB — CBC
HCT: 37.7 % (ref 36.0–46.0)
Hemoglobin: 12.5 g/dL (ref 12.0–15.0)
MCHC: 33.3 g/dL (ref 30.0–36.0)
MCV: 93.5 fl (ref 78.0–100.0)
Platelets: 303 10*3/uL (ref 150.0–400.0)
RBC: 4.03 Mil/uL (ref 3.87–5.11)
RDW: 15.2 % (ref 11.5–15.5)
WBC: 11.6 10*3/uL — ABNORMAL HIGH (ref 4.0–10.5)

## 2020-04-25 LAB — VITAMIN B12: Vitamin B-12: 170 pg/mL — ABNORMAL LOW (ref 211–911)

## 2020-04-25 LAB — HEMOGLOBIN A1C: Hgb A1c MFr Bld: 5.4 % (ref 4.6–6.5)

## 2020-04-25 LAB — TSH: TSH: 3.7 u[IU]/mL (ref 0.35–4.50)

## 2020-04-25 NOTE — Progress Notes (Signed)
   Karla Foster is a 65 y.o. female who presents today for an office visit.  Assessment/Plan:  New/Acute Problems: Falls She has quite a bit of weakness in her right lower extremity.  Think this is likely the largest contributing factor.  She does have a history of lumbar radiculopathy related to her compression fracture in the past.  Will check labs today.  Also refer her back to see neurosurgery.  Has been a few years since he seen them.  We will also place referral to physical therapy.  If she cannot be seen by neurosurgery in a timely manner will need to check imaging.  Overall her symptoms have been stable for the past several weeks do not think we need to do anything emergently at this point.  Chronic Problems Addressed Today: Chronic back pain with Lumbar compression fracture and radiculopathy ([place]) See above.  Worried she has had worsening of her radicular symptoms.  Oral continue Cymbalta and gabapentin and have her follow-up with our surgery soon.  Also place referral to physical therapy.    Subjective:  HPI:  Patient here with repeated falls for the last 10 weeks or so.  Thinks this all started about 2 weeks ago when she slipped on ice.  She fell on the back of her head.  Had been carried in to her house.  Since then she has been following 3-4 times per week.  Will sometimes feel dizzy.  Sometimes feels like her right leg is giving out.  She feels very unsteady on her feet.  She does have a history of lumbar radiculopathy.  She has also had some numbness and tingling in her right leg as well.  No specific treatments tried.  Symptoms have been stable for the past several weeks.       Objective:  Physical Exam: BP 114/73   Pulse 72   Temp 97.9 F (36.6 C) (Temporal)   Ht 5\' 7"  (1.702 m)   Wt 181 lb (82.1 kg)   SpO2 95%   BMI 28.35 kg/m   Gen: No acute distress, resting comfortably MSK:  - Back: No deformities. Tender to palpation along lumbar paraspinal muscles -  Legs: Decreased sensation in right lower extremity.  Normal sensation left lower extremity.  Full range of motion and strength in left lower extremity.  Weakness in right lower extremity noted Neuro: Cranial nerves II through XII intact.  Finger-nose-finger testing intact bilaterally. Psych: Normal affect and thought content      Caleb M. [name], MD 04/25/2020 2:51 PM

## 2020-04-25 NOTE — Assessment & Plan Note (Signed)
See above.  Worried she has had worsening of her radicular symptoms.  Oral continue Cymbalta and gabapentin and have her follow-up with our surgery soon.  Also place referral to physical therapy.

## 2020-04-25 NOTE — Patient Instructions (Signed)
It was very nice to see you today!  I am concerned that you may have a pinched nerve in your back.  We will check blood work today to make sure nothing else is going on.  We need to get you into see a back doctor and I would also like for you to see physical therapy.  Take care, Dr [name]  Please try these tips to maintain a healthy lifestyle:   Eat at least 3 REAL meals and 1-2 snacks per day.  Aim for no more than 5 hours between eating.  If you eat breakfast, please do so within one hour of getting up.    Each meal should contain half fruits/vegetables, one quarter protein, and one quarter carbs (no bigger than a computer mouse)   Cut down on sweet beverages. This includes juice, soda, and sweet tea.     Drink at least 1 glass of water with each meal and aim for at least 8 glasses per day   Exercise at least 150 minutes every week.

## 2020-04-26 NOTE — Progress Notes (Signed)
Please inform patient of the following:  Her B12 is low. I think this could explain some of her symptoms. I would like to start her on the B12 protocol here.  I would still like for her to follow up with neurosurgery and PT.  Algis Greenhouse. [name], MD 04/26/2020 9:35 AM

## 2020-04-28 ENCOUNTER — Other Ambulatory Visit: Payer: Self-pay

## 2020-04-28 ENCOUNTER — Ambulatory Visit (INDEPENDENT_AMBULATORY_CARE_PROVIDER_SITE_OTHER): Payer: Medicare Other

## 2020-04-28 DIAGNOSIS — E538 Deficiency of other specified B group vitamins: Secondary | ICD-10-CM

## 2020-04-28 MED ORDER — CYANOCOBALAMIN 1000 MCG/ML IJ SOLN
1000.0000 ug | Freq: Once | INTRAMUSCULAR | Status: AC
  Administered 2020-04-28: 1000 ug via INTRAMUSCULAR

## 2020-04-28 NOTE — Progress Notes (Signed)
Per orders of Dr. Parker, injection of B-12 given by Melitta D Caldwell in left deltoid. Patient tolerated injection well. Patient will make appointment for 1 week.   

## 2020-05-02 ENCOUNTER — Ambulatory Visit: Payer: Medicare Other | Admitting: Physical Therapy

## 2020-05-02 DIAGNOSIS — R413 Other amnesia: Secondary | ICD-10-CM | POA: Diagnosis not present

## 2020-05-02 DIAGNOSIS — M545 Low back pain, unspecified: Secondary | ICD-10-CM | POA: Diagnosis not present

## 2020-05-02 DIAGNOSIS — R2681 Unsteadiness on feet: Secondary | ICD-10-CM | POA: Diagnosis not present

## 2020-05-02 DIAGNOSIS — S32001K Stable burst fracture of unspecified lumbar vertebra, subsequent encounter for fracture with nonunion: Secondary | ICD-10-CM | POA: Diagnosis not present

## 2020-05-02 DIAGNOSIS — M546 Pain in thoracic spine: Secondary | ICD-10-CM | POA: Diagnosis not present

## 2020-05-03 ENCOUNTER — Encounter: Payer: Self-pay | Admitting: Family Medicine

## 2020-05-04 ENCOUNTER — Ambulatory Visit: Payer: Medicare Other

## 2020-05-04 ENCOUNTER — Ambulatory Visit: Payer: Medicare Other | Admitting: Physical Therapy

## 2020-05-06 ENCOUNTER — Other Ambulatory Visit: Payer: Self-pay | Admitting: Physician Assistant

## 2020-05-06 DIAGNOSIS — R413 Other amnesia: Secondary | ICD-10-CM

## 2020-05-06 DIAGNOSIS — M546 Pain in thoracic spine: Secondary | ICD-10-CM

## 2020-05-06 DIAGNOSIS — M545 Low back pain, unspecified: Secondary | ICD-10-CM

## 2020-05-06 DIAGNOSIS — R2681 Unsteadiness on feet: Secondary | ICD-10-CM

## 2020-05-10 ENCOUNTER — Other Ambulatory Visit: Payer: Self-pay

## 2020-05-10 ENCOUNTER — Ambulatory Visit (INDEPENDENT_AMBULATORY_CARE_PROVIDER_SITE_OTHER): Payer: Medicare Other

## 2020-05-10 DIAGNOSIS — E538 Deficiency of other specified B group vitamins: Secondary | ICD-10-CM | POA: Diagnosis not present

## 2020-05-10 MED ORDER — CYANOCOBALAMIN 1000 MCG/ML IJ SOLN
1000.0000 ug | Freq: Once | INTRAMUSCULAR | Status: AC
  Administered 2020-05-10: 1000 ug via INTRAMUSCULAR

## 2020-05-10 NOTE — Progress Notes (Signed)
I have reviewed the patient's encounter and agree with the documentation.  Algis Greenhouse. [name], MD 05/10/2020 1:59 PM

## 2020-05-10 NOTE — Progress Notes (Signed)
Karla Foster 65 yr old female presents to office today for B12 injection per Dimas Chyle, MD. Administered CYANOCOBALAMIN 1,000 mcg IM right arm. Patient tolerated well.

## 2020-05-16 ENCOUNTER — Ambulatory Visit (INDEPENDENT_AMBULATORY_CARE_PROVIDER_SITE_OTHER): Payer: Medicare Other | Admitting: Physical Therapy

## 2020-05-16 ENCOUNTER — Encounter: Payer: Self-pay | Admitting: Physical Therapy

## 2020-05-16 ENCOUNTER — Other Ambulatory Visit: Payer: Self-pay

## 2020-05-16 DIAGNOSIS — M5441 Lumbago with sciatica, right side: Secondary | ICD-10-CM | POA: Diagnosis not present

## 2020-05-16 DIAGNOSIS — M6281 Muscle weakness (generalized): Secondary | ICD-10-CM | POA: Diagnosis not present

## 2020-05-16 DIAGNOSIS — R2689 Other abnormalities of gait and mobility: Secondary | ICD-10-CM

## 2020-05-16 DIAGNOSIS — G8929 Other chronic pain: Secondary | ICD-10-CM

## 2020-05-17 ENCOUNTER — Ambulatory Visit (INDEPENDENT_AMBULATORY_CARE_PROVIDER_SITE_OTHER): Payer: Medicare Other

## 2020-05-17 DIAGNOSIS — E538 Deficiency of other specified B group vitamins: Secondary | ICD-10-CM | POA: Diagnosis not present

## 2020-05-17 MED ORDER — CYANOCOBALAMIN 1000 MCG/ML IJ SOLN
1000.0000 ug | Freq: Once | INTRAMUSCULAR | Status: AC
  Administered 2020-05-17: 1000 ug via INTRAMUSCULAR
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
## 2020-05-17 NOTE — Progress Notes (Signed)
Per orders of Dr.Parker, injection of Vitamin B 12 given by Reniyah Gootee. Patient tolerated injection well.  

## 2020-05-25 ENCOUNTER — Other Ambulatory Visit: Payer: Self-pay

## 2020-05-25 ENCOUNTER — Ambulatory Visit (INDEPENDENT_AMBULATORY_CARE_PROVIDER_SITE_OTHER): Payer: Medicare Other | Admitting: Physical Therapy

## 2020-05-25 ENCOUNTER — Ambulatory Visit (INDEPENDENT_AMBULATORY_CARE_PROVIDER_SITE_OTHER): Payer: Medicare Other

## 2020-05-25 DIAGNOSIS — M6281 Muscle weakness (generalized): Secondary | ICD-10-CM | POA: Diagnosis not present

## 2020-05-25 DIAGNOSIS — R2689 Other abnormalities of gait and mobility: Secondary | ICD-10-CM

## 2020-05-25 DIAGNOSIS — E538 Deficiency of other specified B group vitamins: Secondary | ICD-10-CM | POA: Diagnosis not present

## 2020-05-25 DIAGNOSIS — M5441 Lumbago with sciatica, right side: Secondary | ICD-10-CM | POA: Diagnosis not present

## 2020-05-25 DIAGNOSIS — G8929 Other chronic pain: Secondary | ICD-10-CM | POA: Diagnosis not present

## 2020-05-25 MED ORDER — CYANOCOBALAMIN 1000 MCG/ML IJ SOLN
1000.0000 ug | Freq: Once | INTRAMUSCULAR | Status: AC
  Administered 2020-05-25: 1000 ug via INTRAMUSCULAR

## 2020-05-25 NOTE — Progress Notes (Signed)
Per orders of Dr. [name], injection of B-12 given by Tobe Sos in right deltoid. Patient tolerated injection well. Patient will make appointment for 1 week.

## 2020-05-25 NOTE — Patient Instructions (Signed)
Access Code: [email or web address] URL: [email or web address] Date: 05/25/2020 Prepared by: Lyndee Hensen  Exercises Hook Lying Single Knee to Chest Stretch with Towel - 2 x daily - 2 sets - 10 reps Seated Pelvic Tilt - 2 x daily - 2 sets - 10 reps Seated Long Arc Quad - 2 x daily - 2 sets - 10 reps

## 2020-05-26 ENCOUNTER — Telehealth: Payer: Self-pay | Admitting: Family Medicine

## 2020-05-26 NOTE — Chronic Care Management (AMB) (Signed)
  Chronic Care Management   Outreach Note  05/26/2020 Name: Karla Foster MRN: [id number] DOB: May 30, 1955  Referred by: Vivi Barrack, MD Reason for referral : No chief complaint on file.   An unsuccessful telephone outreach was attempted today. The patient was referred to the pharmacist for assistance with care management and care coordination.   Follow Up Plan:   Lauretta Grill Upstream Scheduler

## 2020-05-27 ENCOUNTER — Encounter: Payer: Self-pay | Admitting: Physical Therapy
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
## 2020-06-01 ENCOUNTER — Ambulatory Visit (INDEPENDENT_AMBULATORY_CARE_PROVIDER_SITE_OTHER): Payer: Medicare Other | Admitting: Physical Therapy

## 2020-06-01 DIAGNOSIS — M6281 Muscle weakness (generalized): Secondary | ICD-10-CM

## 2020-06-01 DIAGNOSIS — G8929 Other chronic pain: Secondary | ICD-10-CM | POA: Diagnosis not present

## 2020-06-01 DIAGNOSIS — M5441 Lumbago with sciatica, right side: Secondary | ICD-10-CM

## 2020-06-01 DIAGNOSIS — R2689 Other abnormalities of gait and mobility: Secondary | ICD-10-CM

## 2020-06-02 ENCOUNTER — Telehealth: Payer: Self-pay | Admitting: Family Medicine

## 2020-06-02 ENCOUNTER — Encounter: Payer: Self-pay | Admitting: Physical Therapy

## 2020-06-02 NOTE — Progress Notes (Signed)
  Chronic Care Management   Outreach Note  06/02/2020 Name: Karla Foster MRN: [id number] DOB: Dec 29, 1955  Referred by: Vivi Barrack, MD Reason for referral : No chief complaint on file.   A second unsuccessful telephone outreach was attempted today. The patient was referred to pharmacist for assistance with care management and care coordination.  Follow Up Plan:   Lauretta Grill Upstream Scheduler

## 2020-06-02 NOTE — Telephone Encounter (Signed)
Pt returned your call. Please advise.

## 2020-06-06 ENCOUNTER — Encounter: Payer: Self-pay | Admitting: Physical Therapy
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
## 2020-06-08 ENCOUNTER — Ambulatory Visit
Admission: RE | Admit: 2020-06-08 | Discharge: 2020-06-08 | Disposition: A | Discharge status: Home / Self Care | Payer: Medicare Other | Source: Ambulatory Visit | Attending: Physician Assistant | Admitting: Physician Assistant

## 2020-06-08 ENCOUNTER — Other Ambulatory Visit: Payer: Self-pay

## 2020-06-08 ENCOUNTER — Encounter: Payer: Medicare Other | Admitting: Physical Therapy

## 2020-06-08 DIAGNOSIS — M4802 Spinal stenosis, cervical region: Secondary | ICD-10-CM | POA: Diagnosis not present

## 2020-06-08 DIAGNOSIS — J320 Chronic maxillary sinusitis: Secondary | ICD-10-CM | POA: Diagnosis not present

## 2020-06-08 DIAGNOSIS — M546 Pain in thoracic spine: Secondary | ICD-10-CM

## 2020-06-08 DIAGNOSIS — M48061 Spinal stenosis, lumbar region without neurogenic claudication: Secondary | ICD-10-CM | POA: Diagnosis not present

## 2020-06-08 DIAGNOSIS — M545 Low back pain, unspecified: Secondary | ICD-10-CM | POA: Diagnosis not present

## 2020-06-08 DIAGNOSIS — J3489 Other specified disorders of nose and nasal sinuses: Secondary | ICD-10-CM | POA: Diagnosis not present

## 2020-06-08 DIAGNOSIS — Z981 Arthrodesis status: Secondary | ICD-10-CM | POA: Diagnosis not present

## 2020-06-08 DIAGNOSIS — R413 Other amnesia: Secondary | ICD-10-CM

## 2020-06-08 DIAGNOSIS — R2681 Unsteadiness on feet: Secondary | ICD-10-CM

## 2020-06-08 DIAGNOSIS — M4804 Spinal stenosis, thoracic region: Secondary | ICD-10-CM | POA: Diagnosis not present

## 2020-06-08 DIAGNOSIS — H748X3 Other specified disorders of middle ear and mastoid, bilateral: Secondary | ICD-10-CM | POA: Diagnosis not present

## 2020-06-08 DIAGNOSIS — G319 Degenerative disease of nervous system, unspecified: Secondary | ICD-10-CM | POA: Diagnosis not present

## 2020-06-15 ENCOUNTER — Encounter: Payer: Self-pay | Admitting: Physical Therapy

## 2020-06-15 ENCOUNTER — Other Ambulatory Visit: Payer: Self-pay

## 2020-06-15 ENCOUNTER — Ambulatory Visit (INDEPENDENT_AMBULATORY_CARE_PROVIDER_SITE_OTHER): Payer: Medicare Other | Admitting: Physical Therapy

## 2020-06-15 DIAGNOSIS — G8929 Other chronic pain: Secondary | ICD-10-CM | POA: Diagnosis not present

## 2020-06-15 DIAGNOSIS — M5441 Lumbago with sciatica, right side: Secondary | ICD-10-CM

## 2020-06-15 DIAGNOSIS — R2689 Other abnormalities of gait and mobility: Secondary | ICD-10-CM

## 2020-06-15 DIAGNOSIS — M6281 Muscle weakness (generalized): Secondary | ICD-10-CM

## 2020-06-17 ENCOUNTER — Encounter: Payer: Self-pay | Admitting: Physical Therapy
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
## 2020-06-21 ENCOUNTER — Other Ambulatory Visit: Payer: Self-pay

## 2020-06-21 ENCOUNTER — Ambulatory Visit (INDEPENDENT_AMBULATORY_CARE_PROVIDER_SITE_OTHER): Payer: Medicare Other | Admitting: Physical Therapy

## 2020-06-21 VITALS — BP 106/62 | HR 74

## 2020-06-21 DIAGNOSIS — R2689 Other abnormalities of gait and mobility: Secondary | ICD-10-CM | POA: Diagnosis not present

## 2020-06-21 DIAGNOSIS — M5441 Lumbago with sciatica, right side: Secondary | ICD-10-CM

## 2020-06-21 DIAGNOSIS — G8929 Other chronic pain: Secondary | ICD-10-CM

## 2020-06-21 DIAGNOSIS — M6281 Muscle weakness (generalized): Secondary | ICD-10-CM

## 2020-06-22 ENCOUNTER — Encounter: Payer: Self-pay | Admitting: Physical Therapy
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
## 2020-06-28 ENCOUNTER — Other Ambulatory Visit: Payer: Self-pay

## 2020-06-28 ENCOUNTER — Ambulatory Visit (INDEPENDENT_AMBULATORY_CARE_PROVIDER_SITE_OTHER): Payer: Medicare Other | Admitting: Physical Therapy

## 2020-06-28 DIAGNOSIS — M6281 Muscle weakness (generalized): Secondary | ICD-10-CM | POA: Diagnosis not present

## 2020-06-28 DIAGNOSIS — M5441 Lumbago with sciatica, right side: Secondary | ICD-10-CM | POA: Diagnosis not present

## 2020-06-28 DIAGNOSIS — R2689 Other abnormalities of gait and mobility: Secondary | ICD-10-CM

## 2020-06-28 DIAGNOSIS — G8929 Other chronic pain: Secondary | ICD-10-CM | POA: Diagnosis not present

## 2020-06-29 DIAGNOSIS — M545 Low back pain, unspecified: Secondary | ICD-10-CM | POA: Diagnosis not present

## 2020-06-29 DIAGNOSIS — M546 Pain in thoracic spine: Secondary | ICD-10-CM | POA: Diagnosis not present

## 2020-07-06 ENCOUNTER — Encounter: Payer: Self-pay | Admitting: Physical Therapy
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
## 2020-07-12 ENCOUNTER — Other Ambulatory Visit: Payer: Self-pay

## 2020-07-12 ENCOUNTER — Ambulatory Visit (INDEPENDENT_AMBULATORY_CARE_PROVIDER_SITE_OTHER): Payer: Medicare Other | Admitting: Physical Therapy

## 2020-07-12 DIAGNOSIS — M6281 Muscle weakness (generalized): Secondary | ICD-10-CM | POA: Diagnosis not present

## 2020-07-12 DIAGNOSIS — G8929 Other chronic pain: Secondary | ICD-10-CM | POA: Diagnosis not present

## 2020-07-12 DIAGNOSIS — M5441 Lumbago with sciatica, right side: Secondary | ICD-10-CM

## 2020-07-12 DIAGNOSIS — R2689 Other abnormalities of gait and mobility: Secondary | ICD-10-CM

## 2020-07-12 NOTE — Patient Instructions (Signed)
Access Code: [email or web address] URL: [email or web address] Date: 07/12/2020 Prepared by: Lyndee Hensen  Exercises Hook Lying Single Knee to Chest Stretch with Towel - 2 x daily - 2 sets - 10 reps Supine Posterior Pelvic Tilt - 1 x daily - 2 sets - 10 reps Supine Lower Trunk Rotation - 2 x daily - 10 reps - 5 hold Seated Knee Extension AROM - 2 x daily - 2 sets - 10 reps Sit to Stand - 1 x daily - 1 sets - 10 reps Standing March with Counter Support - 1 x daily - 2 sets - 10 reps

## 2020-07-14 ENCOUNTER — Encounter: Payer: Self-pay | Admitting: Physical Therapy
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
## 2020-07-22 ENCOUNTER — Encounter: Payer: Medicare Other | Admitting: Physical Therapy

## 2020-07-28 ENCOUNTER — Ambulatory Visit (INDEPENDENT_AMBULATORY_CARE_PROVIDER_SITE_OTHER): Payer: Medicare Other | Admitting: Physical Therapy

## 2020-07-28 ENCOUNTER — Other Ambulatory Visit: Payer: Self-pay

## 2020-07-28 DIAGNOSIS — R2689 Other abnormalities of gait and mobility: Secondary | ICD-10-CM | POA: Diagnosis not present

## 2020-07-28 DIAGNOSIS — M5441 Lumbago with sciatica, right side: Secondary | ICD-10-CM

## 2020-07-28 DIAGNOSIS — M6281 Muscle weakness (generalized): Secondary | ICD-10-CM | POA: Diagnosis not present

## 2020-07-28 DIAGNOSIS — G8929 Other chronic pain: Secondary | ICD-10-CM | POA: Diagnosis not present

## 2020-07-28 NOTE — Patient Instructions (Signed)
Access Code: [email or web address] URL: [email or web address] Date: 07/28/2020 Prepared by: Lyndee Hensen  Exercises Hook Lying Single Knee to Chest Stretch with Towel - 2 x daily - 2 sets - 10 reps Supine Posterior Pelvic Tilt - 1 x daily - 2 sets - 10 reps Supine Lower Trunk Rotation - 2 x daily - 10 reps - 5 hold Straight Leg Raise - 1 x daily - 1 sets - 5-10 reps Hooklying Clamshell with Resistance - 1 x daily - 2 sets - 10 reps Seated Knee Extension AROM - 2 x daily - 2 sets - 10 reps Sit to Stand - 1 x daily - 1 sets - 10 reps Standing March with Counter Support - 1 x daily - 2 sets - 10 reps

## 2020-08-01 ENCOUNTER — Encounter: Payer: Self-pay | Admitting: Physical Therapy
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
## 2020-08-02 ENCOUNTER — Encounter: Payer: Medicare Other | Admitting: Physical Therapy

## 2020-08-04 ENCOUNTER — Ambulatory Visit (INDEPENDENT_AMBULATORY_CARE_PROVIDER_SITE_OTHER): Payer: Medicare Other | Admitting: Physical Therapy

## 2020-08-04 ENCOUNTER — Other Ambulatory Visit: Payer: Self-pay

## 2020-08-04 DIAGNOSIS — M6281 Muscle weakness (generalized): Secondary | ICD-10-CM

## 2020-08-04 DIAGNOSIS — G8929 Other chronic pain: Secondary | ICD-10-CM

## 2020-08-04 DIAGNOSIS — R2689 Other abnormalities of gait and mobility: Secondary | ICD-10-CM | POA: Diagnosis not present

## 2020-08-04 DIAGNOSIS — M5441 Lumbago with sciatica, right side: Secondary | ICD-10-CM | POA: Diagnosis not present

## 2020-08-08 ENCOUNTER — Encounter: Payer: Self-pay | Admitting: Physical Therapy
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
## 2020-08-10 ENCOUNTER — Encounter: Payer: Self-pay | Admitting: Physical Therapy

## 2020-08-10 ENCOUNTER — Ambulatory Visit (INDEPENDENT_AMBULATORY_CARE_PROVIDER_SITE_OTHER): Payer: Medicare Other | Admitting: Physical Therapy

## 2020-08-10 ENCOUNTER — Other Ambulatory Visit: Payer: Self-pay

## 2020-08-10 DIAGNOSIS — G8929 Other chronic pain: Secondary | ICD-10-CM

## 2020-08-10 DIAGNOSIS — R2689 Other abnormalities of gait and mobility: Secondary | ICD-10-CM

## 2020-08-10 DIAGNOSIS — M5441 Lumbago with sciatica, right side: Secondary | ICD-10-CM | POA: Diagnosis not present

## 2020-08-10 DIAGNOSIS — M6281 Muscle weakness (generalized): Secondary | ICD-10-CM
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
## 2020-08-15 ENCOUNTER — Telehealth: Payer: Self-pay

## 2020-08-15 NOTE — Chronic Care Management (AMB) (Signed)
  Chronic Care Management   Note  08/15/2020 Name: Adam Demary MRN: [id number] DOB: [birth year]  Risa Auman is a 65 y.o. year old female who is a primary care patient of Vivi Barrack, MD. I reached out to Armanda Magic by phone today in response to a referral sent by Ms. Larey Days Lapier's PCP, Vivi Barrack, MD      Ms. Naraine was given information about Chronic Care Management services today including:  CCM service includes personalized support from designated clinical staff supervised by her physician, including individualized plan of care and coordination with other care providers 24/7 contact phone numbers for assistance for urgent and routine care needs. Service will only be billed when office clinical staff spend 20 minutes or more in a month to coordinate care. Only one practitioner may furnish and bill the service in a calendar month. The patient may stop CCM services at any time (effective at the end of the month) by phone call to the office staff. The patient will be responsible for cost sharing (co-pay) of up to 20% of the service fee (after annual deductible is met).  Patient did not agree to enrollment in care management services and does not wish to consider at this time.  Follow up plan: Patient declines engagement by the care management team. Appropriate care team members and provider have been notified via electronic communication.   Noreene Larsson, [place], [place], [place] [address] Direct Dial: [phone] [email or web address]: [email or web address]

## 2020-08-18 ENCOUNTER — Other Ambulatory Visit: Payer: Self-pay

## 2020-08-18 ENCOUNTER — Encounter: Payer: Self-pay | Admitting: Physical Therapy

## 2020-08-18 ENCOUNTER — Ambulatory Visit (INDEPENDENT_AMBULATORY_CARE_PROVIDER_SITE_OTHER): Payer: Medicare Other | Admitting: Physical Therapy

## 2020-08-18 DIAGNOSIS — M5441 Lumbago with sciatica, right side: Secondary | ICD-10-CM

## 2020-08-18 DIAGNOSIS — M6281 Muscle weakness (generalized): Secondary | ICD-10-CM

## 2020-08-18 DIAGNOSIS — G8929 Other chronic pain: Secondary | ICD-10-CM | POA: Diagnosis not present

## 2020-08-18 DIAGNOSIS — R2689 Other abnormalities of gait and mobility: Secondary | ICD-10-CM
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
## 2020-08-18 NOTE — Patient Instructions (Signed)
Access Code: [email or web address] URL: [email or web address] Date: 08/18/2020 Prepared by: Lyndee Hensen  Exercises Hook Lying Single Knee to Chest Stretch with Towel - 2 x daily - 2 sets - 10 reps Supine Posterior Pelvic Tilt - 1 x daily - 2 sets - 10 reps Supine Lower Trunk Rotation - 2 x daily - 10 reps - 5 hold Straight Leg Raise - 1 x daily - 1 sets - 5-10 reps Hooklying Clamshell with Resistance - 1 x daily - 2 sets - 10 reps Seated Knee Extension AROM - 2 x daily - 2 sets - 10 reps Sit to Stand - 1 x daily - 1 sets - 10 reps Standing March with Counter Support - 1 x daily - 2 sets - 10 reps Side Stepping with Counter Support - 1 x daily - 2 sets - 10 reps

## 2020-08-25 ENCOUNTER — Encounter: Payer: Medicare Other | Admitting: Physical Therapy

## 2020-09-04 ENCOUNTER — Other Ambulatory Visit: Payer: Self-pay | Admitting: Family Medicine

## 2020-09-21 ENCOUNTER — Other Ambulatory Visit: Payer: Self-pay | Admitting: Family Medicine

## 2020-09-21 DIAGNOSIS — B0229 Other postherpetic nervous system involvement: Secondary | ICD-10-CM

## 2020-09-25 ENCOUNTER — Other Ambulatory Visit: Payer: Self-pay | Admitting: Family Medicine

## 2020-11-07 ENCOUNTER — Other Ambulatory Visit: Payer: Self-pay | Admitting: Family Medicine

## 2020-11-07 DIAGNOSIS — B0229 Other postherpetic nervous system involvement: Secondary | ICD-10-CM

## 2020-11-15 ENCOUNTER — Other Ambulatory Visit: Payer: Self-pay

## 2020-11-15 ENCOUNTER — Ambulatory Visit (INDEPENDENT_AMBULATORY_CARE_PROVIDER_SITE_OTHER): Payer: Medicare Other | Admitting: Family Medicine

## 2020-11-15 ENCOUNTER — Encounter: Payer: Self-pay | Admitting: Family Medicine

## 2020-11-15 VITALS — BP 113/58 | HR 80 | Temp 97.8°F | Ht 67.0 in | Wt 187.4 lb

## 2020-11-15 DIAGNOSIS — F325 Major depressive disorder, single episode, in full remission: Secondary | ICD-10-CM

## 2020-11-15 DIAGNOSIS — F419 Anxiety disorder, unspecified: Secondary | ICD-10-CM

## 2020-11-15 DIAGNOSIS — Z23 Encounter for immunization: Secondary | ICD-10-CM | POA: Diagnosis not present

## 2020-11-15 DIAGNOSIS — R739 Hyperglycemia, unspecified: Secondary | ICD-10-CM | POA: Diagnosis not present

## 2020-11-15 LAB — POCT GLYCOSYLATED HEMOGLOBIN (HGB A1C): Hemoglobin A1C: 5.5 % (ref 4.0–5.6)

## 2020-11-15 NOTE — Assessment & Plan Note (Signed)
Stable on Cymbalta 60 mg daily and trazodone 300 mg daily.

## 2020-11-15 NOTE — Assessment & Plan Note (Signed)
Worsened slightly due to replacement but overall stable.  We will continue her current regimen of Cymbalta 60 mg daily, trazodone 300 mg daily and clonazepam as needed.

## 2020-11-15 NOTE — Progress Notes (Signed)
   Karla Foster is a 65 y.o. female who presents today for an office visit.  Assessment/Plan:  New/Acute Problems: Rib Pain Likely contusion.  Seems to be recovering.  We will continue conservative measures and she will let me know if she does not have continued improvement.  Chronic Problems Addressed Today: Major depressive disorder, single episode, in remission (HCC) Stable on Cymbalta 60 mg daily and trazodone 300 mg daily.  Anxiety Worsened slightly due to replacement but overall stable.  We will continue her current regimen of Cymbalta 60 mg daily, trazodone 300 mg daily and clonazepam as needed.  Hyperglycemia A1c 5.5. Continue lifestyle modifications.   Flu vaccine given today    Subjective:  HPI:  She complains of hitting her rib during labor day cookout. She is concerned she might have broke her rib . She denies any bruising in the area. She notes pain has been getting better. However, it is still present when touched.   She complains of having difficulty sleeping at night. She is taking care of her husband and is under stress due to this. She is currently on Cymbalta 60 mg daily, trazodone 300 mg and Klonopin for major depressive disorder and anxiety. Her A1c was 5.5 today. She would like to receive flu vaccine today.        Objective:  Physical Exam: BP (!) 113/58   Pulse 80   Temp 97.8 F (36.6 C) (Temporal)   Ht 5\' 7"  (1.702 m)   Wt 187 lb 6.4 oz (85 kg)   SpO2 94%   BMI 29.35 kg/m   Gen: Foster acute distress, resting comfortably CV: Regular rate and rhythm with Foster murmurs appreciated Pulm: Normal work of breathing, clear to auscultation bilaterally with Foster crackles, wheezes, or rhonchi Neuro: Grossly normal, moves all extremities Psych: Normal affect and thought content       I,Savera Zaman,acting as a scribe for Dimas Chyle, MD.,have documented all relevant documentation on the behalf of Dimas Chyle, MD,as directed by  Dimas Chyle, MD while  in the presence of Dimas Chyle, MD.   I, Dimas Chyle, MD, have reviewed all documentation for this visit. The documentation on 11/15/20 for the exam, diagnosis, procedures, and orders are all accurate and complete.  Algis Greenhouse. [name], MD 11/15/2020 11:06 AM

## 2020-11-15 NOTE — Patient Instructions (Addendum)
It was very nice to see you today!  Your A1c is 5.5 This is NORMAL.   No changes today.   I will see you back in 6 months. Please come back to see me sooner if needed.   Take care, Dr [name]  PLEASE NOTE:  If you had any lab tests please let us know if you have not heard back within a few days. You may see your results on mychart before we have a chance to review them but we will give you a call once they are reviewed by [place]. If we ordered any referrals today, please let us know if you have not heard from their office within the next week.   Please try these tips to maintain a healthy lifestyle:  Eat at least 3 REAL meals and 1-2 snacks per day.  Aim for no more than 5 hours between eating.  If you eat breakfast, please do so within one hour of getting up.   Each meal should contain half fruits/vegetables, one quarter protein, and one quarter carbs (no bigger than a computer mouse)  Cut down on sweet beverages. This includes juice, soda, and sweet tea.   Drink at least 1 glass of water with each meal and aim for at least 8 glasses per day  Exercise at least 150 minutes every week.

## 2020-12-13 ENCOUNTER — Other Ambulatory Visit: Payer: Self-pay | Admitting: Family Medicine

## 2021-01-03 ENCOUNTER — Telehealth: Payer: Self-pay

## 2021-01-03 NOTE — Telephone Encounter (Signed)
Called pt on both numbers provided in the chart, no answer. Left her a message to call the office back.

## 2021-01-03 NOTE — Telephone Encounter (Signed)
Nurse: Nicki Reaper, RN, Malachy Mood Date/Time Karla Foster Time): 01/03/2021 2:04:22 PM Confirm and document reason for call. If symptomatic, describe symptoms. ---Caller states she has been sick for the last for 2 weeks, has had to go outside to get air, coughing all the time, has Migraine and rates pain 9/10, is taking Tylenol with relief, has SOB with sitting, has chest pressure that comes and goes and is having pressure now, has diarrhea that started 2 night ago and has had diarrhea all morning, drinking fluids and voiding.  Does the patient have any new or worsening symptoms? ---Yes Will a triage be completed? ---Yes Related visit to physician within the last 2 weeks? ---No Does the PT have any chronic conditions? (i.e. diabetes, asthma, this includes High risk factors for pregnancy, etc.) ---Yes List chronic conditions. ---Asthma Is this a behavioral health or substance abuse call? ---No  Asthma Attack SEVERE asthma attack (e.g., struggling for each breath, speaks in single words, loud wheezes, pulse >120) (RED Zone) Nicki Reaper, RN, Malachy Mood 01/03/2021 2:08:45 PM  01/03/2021 2:02:46 PM Send to Urgent Queue Karla Foster 01/03/2021 2:15:11 PM 911 Outcome Documentation Nicki Reaper, RN, Malachy Mood Reason: refused 01/03/2021 2:15:45 PM Called On-Call Provider Nicki Reaper, RN, Malachy Mood Reason: [name] in office 01/03/2021 2:12:56 PM Call EMS 911 Now Nicki Reaper, RN, Malachy Mood  User: [name], RN Date/Time Karla Foster Time): 01/03/2021 2:14:56 PM Refused to call EMS due to having to care for her husband at home and she states no one can stay with him, instructed she is very sick and needs to be seen, called the office and spoke with Andee Poles and was instructed she will talk with PCP and call the pt back,

## 2021-01-04 NOTE — Telephone Encounter (Signed)
Pt called back to see what she needs to do. Please Advise.

## 2021-01-09 NOTE — Telephone Encounter (Signed)
Left message to return call to our office at their convenience.  

## 2021-01-17 ENCOUNTER — Other Ambulatory Visit: Payer: Self-pay | Admitting: Family Medicine

## 2021-01-17 DIAGNOSIS — B0229 Other postherpetic nervous system involvement: Secondary | ICD-10-CM

## 2021-03-06 ENCOUNTER — Ambulatory Visit: Payer: Medicare Other

## 2021-03-17 ENCOUNTER — Other Ambulatory Visit: Payer: Self-pay

## 2021-03-17 ENCOUNTER — Encounter: Payer: Self-pay | Admitting: Family Medicine

## 2021-03-17 ENCOUNTER — Other Ambulatory Visit: Payer: Self-pay | Admitting: Family Medicine

## 2021-03-17 ENCOUNTER — Ambulatory Visit (INDEPENDENT_AMBULATORY_CARE_PROVIDER_SITE_OTHER): Payer: Medicare Other | Admitting: Family Medicine

## 2021-03-17 VITALS — BP 120/82 | HR 70 | Temp 98.2°F | Ht 67.0 in | Wt 190.5 lb

## 2021-03-17 DIAGNOSIS — F17200 Nicotine dependence, unspecified, uncomplicated: Secondary | ICD-10-CM

## 2021-03-17 DIAGNOSIS — K219 Gastro-esophageal reflux disease without esophagitis: Secondary | ICD-10-CM

## 2021-03-17 DIAGNOSIS — F419 Anxiety disorder, unspecified: Secondary | ICD-10-CM | POA: Diagnosis not present

## 2021-03-17 DIAGNOSIS — E663 Overweight: Secondary | ICD-10-CM

## 2021-03-17 DIAGNOSIS — S32010D Wedge compression fracture of first lumbar vertebra, subsequent encounter for fracture with routine healing: Secondary | ICD-10-CM | POA: Diagnosis not present

## 2021-03-17 DIAGNOSIS — J449 Chronic obstructive pulmonary disease, unspecified: Secondary | ICD-10-CM | POA: Diagnosis not present

## 2021-03-17 DIAGNOSIS — F325 Major depressive disorder, single episode, in full remission: Secondary | ICD-10-CM | POA: Diagnosis not present

## 2021-03-17 DIAGNOSIS — Z0001 Encounter for general adult medical examination with abnormal findings: Secondary | ICD-10-CM

## 2021-03-17 DIAGNOSIS — E785 Hyperlipidemia, unspecified: Secondary | ICD-10-CM

## 2021-03-17 DIAGNOSIS — R739 Hyperglycemia, unspecified: Secondary | ICD-10-CM

## 2021-03-17 DIAGNOSIS — F17210 Nicotine dependence, cigarettes, uncomplicated: Secondary | ICD-10-CM

## 2021-03-17 MED ORDER — ESOMEPRAZOLE MAGNESIUM 40 MG PO CPDR: 40.0000 mg | DELAYED_RELEASE_CAPSULE | Freq: Every day | ORAL | 3 refills | Status: DC

## 2021-03-17 MED ORDER — WEGOVY 0.25 MG/0.5ML ~~LOC~~ SOAJ: 0.2500 mg | SUBCUTANEOUS | 0 refills | Status: DC

## 2021-03-17 NOTE — Telephone Encounter (Signed)
She will need to check with insurance to see what alternatives they will cover.

## 2021-03-17 NOTE — Patient Instructions (Signed)
It was very nice to see you today!  We we will check blood work today.  Let me know if you have congestion does not improve in the next 1 to 2 weeks.  I will send in a prescription for [facility].  Please send me a message in a few weeks let me know how this is working.  We can increase the dose every month.  We will see you back in a year for your next physical.  Come back sooner if needed.  Take care, Dr [name]  PLEASE NOTE:  If you had any lab tests please let us know if you have not heard back within a few days. You may see your results on mychart before we have a chance to review them but we will give you a call once they are reviewed by [place]. If we ordered any referrals today, please let us know if you have not heard from their office within the next week.   Please try these tips to maintain a healthy lifestyle:  Eat at least 3 REAL meals and 1-2 snacks per day.  Aim for no more than 5 hours between eating.  If you eat breakfast, please do so within one hour of getting up.   Each meal should contain half fruits/vegetables, one quarter protein, and one quarter carbs (no bigger than a computer mouse)  Cut down on sweet beverages. This includes juice, soda, and sweet tea.   Drink at least 1 glass of water with each meal and aim for at least 8 glasses per day  Exercise at least 150 minutes every week.    Preventive Care 72 Years and Older, Female Preventive care refers to lifestyle choices and visits with your health care provider that can promote health and wellness. Preventive care visits are also called wellness exams. What can I expect for my preventive care visit? Counseling Your health care provider may ask you questions about your: Medical history, including: Past medical problems. Family medical history. Pregnancy and menstrual history. History of falls. Current health, including: Memory and ability to understand (cognition). Emotional well-being. Home life and  relationship well-being. Sexual activity and sexual health. Lifestyle, including: Alcohol, nicotine or tobacco, and drug use. Access to firearms. Diet, exercise, and sleep habits. Work and work Statistician. Sunscreen use. Safety issues such as seatbelt and bike helmet use. Physical exam Your health care provider will check your: Height and weight. These may be used to calculate your BMI (body mass index). BMI is a measurement that tells if you are at a healthy weight. Waist circumference. This measures the distance around your waistline. This measurement also tells if you are at a healthy weight and may help predict your risk of certain diseases, such as type 2 diabetes and high blood pressure. Heart rate and blood pressure. Body temperature. Skin for abnormal spots. What immunizations do I need? Vaccines are usually given at various ages, according to a schedule. Your health care provider will recommend vaccines for you based on your age, medical history, and lifestyle or other factors, such as travel or where you work. What tests do I need? Screening Your health care provider may recommend screening tests for certain conditions. This may include: Lipid and cholesterol levels. Hepatitis C test. Hepatitis B test. HIV (human immunodeficiency virus) test. STI (sexually transmitted infection) testing, if you are at risk. Lung cancer screening. Colorectal cancer screening. Diabetes screening. This is done by checking your blood sugar (glucose) after you have not eaten for a  while (fasting). Mammogram. Talk with your health care provider about how often you should have regular mammograms. BRCA-related cancer screening. This may be done if you have a family history of breast, ovarian, tubal, or peritoneal cancers. Bone density scan. This is done to screen for osteoporosis. Talk with your health care provider about your test results, treatment options, and if necessary, the need for more  tests. Follow these instructions at home: Eating and drinking  Eat a diet that includes fresh fruits and vegetables, whole grains, lean protein, and low-fat dairy products. Limit your intake of foods with high amounts of sugar, saturated fats, and salt. Take vitamin and mineral supplements as recommended by your health care provider. Do not drink alcohol if your health care provider tells you not to drink. If you drink alcohol: Limit how much you have to 0-1 drink a day. Know how much alcohol is in your drink. In the [place] one drink equals one 12 oz bottle of beer (355 mL), one 5 oz glass of wine (148 mL), or one 1 oz glass of hard liquor (44 mL). Lifestyle Brush your teeth every morning and night with fluoride toothpaste. Floss one time each day. Exercise for at least 30 minutes 5 or more days each week. Do not use any products that contain nicotine or tobacco. These products include cigarettes, chewing tobacco, and vaping devices, such as e-cigarettes. If you need help quitting, ask your health care provider. Do not use drugs. If you are sexually active, practice safe sex. Use a condom or other form of protection in order to prevent STIs. Take aspirin only as told by your health care provider. Make sure that you understand how much to take and what form to take. Work with your health care provider to find out whether it is safe and beneficial for you to take aspirin daily. Ask your health care provider if you need to take a cholesterol-lowering medicine (statin). Find healthy ways to manage stress, such as: Meditation, yoga, or listening to music. Journaling. Talking to a trusted person. Spending time with friends and family. Minimize exposure to UV radiation to reduce your risk of skin cancer. Safety Always wear your seat belt while driving or riding in a vehicle. Do not drive: If you have been drinking alcohol. Do not ride with someone who has been drinking. When you are tired or  distracted. While texting. If you have been using any mind-altering substances or drugs. Wear a helmet and other protective equipment during sports activities. If you have firearms in your house, make sure you follow all gun safety procedures. What's next? Visit your health care provider once a year for an annual wellness visit. Ask your health care provider how often you should have your eyes and teeth checked. Stay up to date on all vaccines. This information is not intended to replace advice given to you by your health care provider. Make sure you discuss any questions you have with your health care provider. Document Revised: 08/03/2020 Document Reviewed: 08/03/2020 Elsevier Patient Education  [place].

## 2021-03-17 NOTE — Assessment & Plan Note (Signed)
BMI> 27 with comorbidities.  We discussed lifestyle modifications.  This is hard due to a caregiver burden.  We will start [facility].  She will follow-up with me in a few weeks via MyChart.

## 2021-03-17 NOTE — Assessment & Plan Note (Signed)
Stable on Cymbalta 60 mg daily, trazodone 300 mg daily, and clonazepam as needed.

## 2021-03-17 NOTE — Assessment & Plan Note (Signed)
Check lipids 

## 2021-03-17 NOTE — Assessment & Plan Note (Signed)
Stable on Cymbalta 60 mg daily and trazodone 300 mg daily.

## 2021-03-17 NOTE — Telephone Encounter (Signed)
Message from the pharmacy [facility] not covered need alternative.
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
## 2021-03-17 NOTE — Assessment & Plan Note (Signed)
Patient was asked about her tobacco use today and was strongly advised to quit. Patient is currently contemplative. We reviewed treatment options to assist her quit smoking including NRT, Chantix, and Bupropion. Follow up at next office visit.   Total time spent counseling approximately 3 minutes.   

## 2021-03-17 NOTE — Assessment & Plan Note (Signed)
Refilled Nexium 40 mg daily.

## 2021-03-17 NOTE — Assessment & Plan Note (Signed)
Symptoms are stable.  She is still has quite a bit of low back pain.  She does not have time to go to physical therapy currently.

## 2021-03-17 NOTE — Assessment & Plan Note (Signed)
Stable. No recent flares.  

## 2021-03-18 LAB — COMPREHENSIVE METABOLIC PANEL
AG Ratio: 1.6 (calc) (ref 1.0–2.5)
ALT: 7 U/L (ref 6–29)
AST: 22 U/L (ref 10–35)
Albumin: 3.9 g/dL (ref 3.6–5.1)
Alkaline phosphatase (APISO): 105 U/L (ref 37–153)
BUN: 9 mg/dL (ref 7–25)
CO2: 29 mmol/L (ref 20–32)
Calcium: 9.4 mg/dL (ref 8.6–10.4)
Chloride: 100 mmol/L (ref 98–110)
Creat: 0.84 mg/dL (ref 0.50–1.05)
Globulin: 2.5 g/dL (calc) (ref 1.9–3.7)
Glucose, Bld: 94 mg/dL (ref 65–99)
Potassium: 4.4 mmol/L (ref 3.5–5.3)
Sodium: 138 mmol/L (ref 135–146)
Total Bilirubin: 0.3 mg/dL (ref 0.2–1.2)
Total Protein: 6.4 g/dL (ref 6.1–8.1)

## 2021-03-18 LAB — LIPID PANEL
Cholesterol: 221 mg/dL — ABNORMAL HIGH (ref <200)
HDL: 57 mg/dL (ref >=50)
LDL Cholesterol (Calc): 138 mg/dL (calc) — ABNORMAL HIGH
Non-HDL Cholesterol (Calc): 164 mg/dL (calc) — ABNORMAL HIGH (ref <130)
Total CHOL/HDL Ratio: 3.9 (calc) (ref <5.0)
Triglycerides: 136 mg/dL (ref <150)

## 2021-03-18 LAB — CBC
HCT: 41.8 % (ref 35.0–45.0)
Hemoglobin: 14 g/dL (ref 11.7–15.5)
MCH: 31.3 pg (ref 27.0–33.0)
MCHC: 33.5 g/dL (ref 32.0–36.0)
MCV: 93.3 fL (ref 80.0–100.0)
MPV: 10.4 fL (ref 7.5–12.5)
Platelets: 318 10*3/uL (ref 140–400)
RBC: 4.48 10*6/uL (ref 3.80–5.10)
RDW: 13.3 % (ref 11.0–15.0)
WBC: 9.6 10*3/uL (ref 3.8–10.8)

## 2021-03-18 LAB — HEMOGLOBIN A1C
Hgb A1c MFr Bld: 5.4 % of total Hgb (ref <5.7)
Mean Plasma Glucose: 108 mg/dL
eAG (mmol/L): 6 mmol/L

## 2021-03-18 LAB — TSH: TSH: 2 mIU/L (ref 0.40–4.50)

## 2021-03-21 ENCOUNTER — Telehealth: Payer: Self-pay

## 2021-03-21 ENCOUNTER — Encounter: Payer: Self-pay | Admitting: Family Medicine

## 2021-03-21 ENCOUNTER — Ambulatory Visit (INDEPENDENT_AMBULATORY_CARE_PROVIDER_SITE_OTHER): Payer: Medicare Other | Admitting: Family Medicine

## 2021-03-21 ENCOUNTER — Other Ambulatory Visit: Payer: Self-pay

## 2021-03-21 VITALS — Ht 67.0 in | Wt 190.0 lb

## 2021-03-21 DIAGNOSIS — J329 Chronic sinusitis, unspecified: Secondary | ICD-10-CM

## 2021-03-21 DIAGNOSIS — J449 Chronic obstructive pulmonary disease, unspecified: Secondary | ICD-10-CM

## 2021-03-21 MED ORDER — DOXYCYCLINE HYCLATE 100 MG PO TABS: 100.0000 mg | ORAL_TABLET | Freq: Two times a day (BID) | ORAL | 0 refills | Status: DC

## 2021-03-21 MED ORDER — ATORVASTATIN CALCIUM 40 MG PO TABS: 40.0000 mg | ORAL_TABLET | Freq: Every day | ORAL | 3 refills | Status: DC

## 2021-03-21 MED ORDER — ALBUTEROL SULFATE HFA 108 (90 BASE) MCG/ACT IN AERS: 2.0000 | INHALATION_SPRAY | Freq: Four times a day (QID) | RESPIRATORY_TRACT | 0 refills | Status: DC | PRN

## 2021-03-21 MED ORDER — PREDNISONE 50 MG PO TABS: ORAL_TABLET | ORAL | 0 refills | Status: DC

## 2021-03-21 NOTE — Assessment & Plan Note (Signed)
She is speaking in full sentences.  Not able to perform full pulmonary exam over the phone however does not have any other signs of respiratory distress or other red flags.  Will treat for presumed COPD flare with course of doxycycline and prednisone.  We will also refill her albuterol inhaler.  She will let us know if not improving over the next couple of days.  We discussed reasons to return to care or seek emergent care.

## 2021-03-21 NOTE — Progress Notes (Signed)
Please inform patient of the following:  Cholesterol is borderline elevated but stable. The rest of her labs are all normal. We could start a cholesterol medication to improve her cholesterol numbers and lower risk of heart attack and stroke. Please send in lipitor 40mg  daily if she is willing to start. Regardless, she should work on diet and exercise and we can recheck in a year or so.  Algis Greenhouse. [name], MD 03/21/2021 7:58 AM

## 2021-03-21 NOTE — Telephone Encounter (Signed)
Patient called in and stated she would like a antibiotic called in for her Sinuses. Patient states she talked about this when she was here on 03/17/21.

## 2021-03-21 NOTE — Progress Notes (Signed)
° °  Mairely Foxworth is a 66 y.o. female who presents today for a virtual office visit.  Assessment/Plan:  New/Acute Problems: Sinusitis Discussed limitations of virtual visit and inability to perform physical exam.  We will start treatment with doxycycline.  We will also give prednisone burst for COPD flare as below.  She can use over-the-counter meds.  Discussed reasons to return to care.  Chronic Problems Addressed Today: COPD (chronic obstructive pulmonary disease) ([place]) She is speaking in full sentences.  Not able to perform full pulmonary exam over the phone however does not have any other signs of respiratory distress or other red flags.  Will treat for presumed COPD flare with course of doxycycline and prednisone.  We will also refill her albuterol inhaler.  She will let us know if not improving over the next couple of days.  We discussed reasons to return to care or seek emergent care.     Subjective:  HPI:  Patient here with wheezing, sore throat, sinus congestion.  She was here for a physical last week and was dealing with symptoms then as well.  We discussed potentially starting an antibiotic however she deferred at that time.  Fortunately her symptoms have worsened  She has tried cough drops and over-the-counter meds without improvement.  She had a fever yesterday.        Objective/Observations  Physical Exam: Gen: NAD, resting comfortably Pulm: Normal work of breathing Neuro: Grossly normal, moves all extremities Psych: Normal affect and thought content  Virtual Visit via Video   I connected with Armanda Magic on 03/21/21 at 11:20 AM EST by a video enabled telemedicine application and verified that I am speaking with the correct person using two identifiers. The limitations of evaluation and management by telemedicine and the availability of in person appointments were discussed. The patient expressed understanding and agreed to proceed.   Patient  location: Home Provider location: [place] participating in the virtual visit: Myself and Patient     Algis Greenhouse. [name], MD 03/21/2021 11:18 AM

## 2021-03-21 NOTE — Telephone Encounter (Signed)
Patient has virtual visit with PCP

## 2021-04-01 ENCOUNTER — Other Ambulatory Visit: Payer: Self-pay | Admitting: Family Medicine

## 2021-05-12 ENCOUNTER — Other Ambulatory Visit: Payer: Self-pay

## 2021-05-12 ENCOUNTER — Ambulatory Visit (INDEPENDENT_AMBULATORY_CARE_PROVIDER_SITE_OTHER): Payer: Medicare Other

## 2021-05-12 DIAGNOSIS — Z0000 Encounter for general adult medical examination without abnormal findings: Secondary | ICD-10-CM | POA: Diagnosis not present
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
## 2021-05-12 NOTE — Patient Instructions (Signed)
Ms. Eastlick , ?Thank you for taking time to come for your Medicare Wellness Visit. I appreciate your ongoing commitment to your health goals. Please review the following plan we discussed and let me know if I can assist you in the future.  ? ?Screening recommendations/referrals: ?Colonoscopy: Done 04/20/15 repeat every 10 years  ?Mammogram: Done 10/02/19 repeat every year  ?Bone Density: pt stated completed  ?Recommended yearly ophthalmology/optometry visit for glaucoma screening and checkup ?Recommended yearly dental visit for hygiene and checkup ? ?Vaccinations: ?Influenza vaccine: Done 11/15/20 repeat every year  ?Pneumococcal vaccine: Due ?Tdap vaccine: Due  ?Shingles vaccine: Shingrix discussed. Please contact your pharmacy for coverage information.    ?Covid-19:Completed 8/6, 8/27, 12/08/19 ? ?Advanced directives: Advance directive discussed with you today. Even though you declined this today please call our office should you change your mind and we can give you the proper paperwork for you to fill out. ? ?Conditions/risks identified: None at this time  ? ?Next appointment: Follow up in one year for your annual wellness visit  ? ? ?Preventive Care 51 Years and Older, Female ?Preventive care refers to lifestyle choices and visits with your health care provider that can promote health and wellness. ?What does preventive care include? ?A yearly physical exam. This is also called an annual well check. ?Dental exams once or twice a year. ?Routine eye exams. Ask your health care provider how often you should have your eyes checked. ?Personal lifestyle choices, including: ?Daily care of your teeth and gums. ?Regular physical activity. ?Eating a healthy diet. ?Avoiding tobacco and drug use. ?Limiting alcohol use. ?Practicing safe sex. ?Taking low-dose aspirin every day. ?Taking vitamin and mineral supplements as recommended by your health care provider. ?What happens during an annual well check? ?The services and  screenings done by your health care provider during your annual well check will depend on your age, overall health, lifestyle risk factors, and family history of disease. ?Counseling  ?Your health care provider may ask you questions about your: ?Alcohol use. ?Tobacco use. ?Drug use. ?Emotional well-being. ?Home and relationship well-being. ?Sexual activity. ?Eating habits. ?History of falls. ?Memory and ability to understand (cognition). ?Work and work Statistician. ?Reproductive health. ?Screening  ?You may have the following tests or measurements: ?Height, weight, and BMI. ?Blood pressure. ?Lipid and cholesterol levels. These may be checked every 5 years, or more frequently if you are over 23 years old. ?Skin check. ?Lung cancer screening. You may have this screening every year starting at age 57 if you have a 30-pack-year history of smoking and currently smoke or have quit within the past 15 years. ?Fecal occult blood test (FOBT) of the stool. You may have this test every year starting at age 37. ?Flexible sigmoidoscopy or colonoscopy. You may have a sigmoidoscopy every 5 years or a colonoscopy every 10 years starting at age 59. ?Hepatitis C blood test. ?Hepatitis B blood test. ?Sexually transmitted disease (STD) testing. ?Diabetes screening. This is done by checking your blood sugar (glucose) after you have not eaten for a while (fasting). You may have this done every 1-3 years. ?Bone density scan. This is done to screen for osteoporosis. You may have this done starting at age 39. ?Mammogram. This may be done every 1-2 years. Talk to your health care provider about how often you should have regular mammograms. ?Talk with your health care provider about your test results, treatment options, and if necessary, the need for more tests. ?Vaccines  ?Your health care provider may recommend certain vaccines, such  as: ?Influenza vaccine. This is recommended every year. ?Tetanus, diphtheria, and acellular pertussis (Tdap,  Td) vaccine. You may need a Td booster every 10 years. ?Zoster vaccine. You may need this after age 22. ?Pneumococcal 13-valent conjugate (PCV13) vaccine. One dose is recommended after age 30. ?Pneumococcal polysaccharide (PPSV23) vaccine. One dose is recommended after age 30. ?Talk to your health care provider about which screenings and vaccines you need and how often you need them. ?This information is not intended to replace advice given to you by your health care provider. Make sure you discuss any questions you have with your health care provider. ?Document Released: 03/04/2015 Document Revised: 10/26/2015 Document Reviewed: 12/07/2014 ?Elsevier Interactive Patient Education ? 2017 [place]. ? ?Fall Prevention in the Home ?Falls can cause injuries. They can happen to people of all ages. There are many things you can do to make your home safe and to help prevent falls. ?What can I do on the outside of my home? ?Regularly fix the edges of walkways and driveways and fix any cracks. ?Remove anything that might make you trip as you walk through a door, such as a raised step or threshold. ?Trim any bushes or trees on the path to your home. ?Use bright outdoor lighting. ?Clear any walking paths of anything that might make someone trip, such as rocks or tools. ?Regularly check to see if handrails are loose or broken. Make sure that both sides of any steps have handrails. ?Any raised decks and porches should have guardrails on the edges. ?Have any leaves, snow, or ice cleared regularly. ?Use sand or salt on walking paths during winter. ?Clean up any spills in your garage right away. This includes oil or grease spills. ?What can I do in the bathroom? ?Use night lights. ?Install grab bars by the toilet and in the tub and shower. Do not use towel bars as grab bars. ?Use non-skid mats or decals in the tub or shower. ?If you need to sit down in the shower, use a plastic, non-slip stool. ?Keep the floor dry. Clean up any  water that spills on the floor as soon as it happens. ?Remove soap buildup in the tub or shower regularly. ?Attach bath mats securely with double-sided non-slip rug tape. ?Do not have throw rugs and other things on the floor that can make you trip. ?What can I do in the bedroom? ?Use night lights. ?Make sure that you have a light by your bed that is easy to reach. ?Do not use any sheets or blankets that are too big for your bed. They should not hang down onto the floor. ?Have a firm chair that has side arms. You can use this for support while you get dressed. ?Do not have throw rugs and other things on the floor that can make you trip. ?What can I do in the kitchen? ?Clean up any spills right away. ?Avoid walking on wet floors. ?Keep items that you use a lot in easy-to-reach places. ?If you need to reach something above you, use a strong step stool that has a grab bar. ?Keep electrical cords out of the way. ?Do not use floor polish or wax that makes floors slippery. If you must use wax, use non-skid floor wax. ?Do not have throw rugs and other things on the floor that can make you trip. ?What can I do with my stairs? ?Do not leave any items on the stairs. ?Make sure that there are handrails on both sides of the stairs and use  them. Fix handrails that are broken or loose. Make sure that handrails are as long as the stairways. ?Check any carpeting to make sure that it is firmly attached to the stairs. Fix any carpet that is loose or worn. ?Avoid having throw rugs at the top or bottom of the stairs. If you do have throw rugs, attach them to the floor with carpet tape. ?Make sure that you have a light switch at the top of the stairs and the bottom of the stairs. If you do not have them, ask someone to add them for you. ?What else can I do to help prevent falls? ?Wear shoes that: ?Do not have high heels. ?Have rubber bottoms. ?Are comfortable and fit you well. ?Are closed at the toe. Do not wear sandals. ?If you use a  stepladder: ?Make sure that it is fully opened. Do not climb a closed stepladder. ?Make sure that both sides of the stepladder are locked into place. ?Ask someone to hold it for you, if possible. ?Clearly mark

## 2021-05-31 ENCOUNTER — Other Ambulatory Visit: Payer: Self-pay | Admitting: Family Medicine

## 2021-05-31 DIAGNOSIS — B0229 Other postherpetic nervous system involvement: Secondary | ICD-10-CM

## 2021-06-14 ENCOUNTER — Other Ambulatory Visit: Payer: Self-pay | Admitting: Family Medicine

## 2021-06-15 NOTE — Telephone Encounter (Signed)
Rx Albuterol The original prescription was discontinued on 05/12/2021 by Willette Brace, LPN. Renewing this prescription may not be appropriate. ?

## 2021-07-06 ENCOUNTER — Other Ambulatory Visit: Payer: Self-pay | Admitting: Family Medicine

## 2021-07-25 ENCOUNTER — Other Ambulatory Visit: Payer: Self-pay | Admitting: Family Medicine

## 2021-07-25 DIAGNOSIS — B0229 Other postherpetic nervous system involvement: Secondary | ICD-10-CM

## 2021-09-14 ENCOUNTER — Ambulatory Visit: Payer: Medicare Other | Admitting: Family Medicine

## 2021-09-15 ENCOUNTER — Ambulatory Visit: Payer: Medicare Other | Admitting: Family Medicine

## 2021-09-18 ENCOUNTER — Telehealth: Payer: Self-pay | Admitting: Family Medicine

## 2021-09-18 NOTE — Telephone Encounter (Signed)
FYI--Paylor, RN, from [facility] called stating patient was recommended to go to the ED due to symptoms. Patient declined this recommendation and stated she would like to be seen in person. I spoke with the patient and tried offering her to go to the ED, but she declined. States "I don't think I need all that". Patient stated if PCP did not want to see her, she could go to another provider. Please advise.

## 2021-09-18 NOTE — Telephone Encounter (Signed)
Patient Name: Karla Foster Gender: Female DOB: 04/06/1955 Age: 66 Y 61 M 21 D Return Phone Number: [phone] (Primary) Address: City/ State/ Zip: [place]  [address] Client [facility] Client [facility] Provider Dimas Chyle- MD Contact Type Call Who Is Calling Patient / Member / Family / Caregiver Call Type Triage / Clinical Relationship To Patient Self Return Phone Number [phone] (Primary) Chief Complaint BREATHING - shortness of breath or sounds breathless Reason for Call Symptomatic / Request for [employer] states she has SOB and increased HR for about a month. States she is losing feeling feeling in her feet as well as swelling. Translation No Nurse Assessment Nurse: Jearld Pies, RN, Lovena Le Date/Time Eilene Ghazi Time): 09/18/2021 10:21:03 AM Confirm and document reason for call. If symptomatic, describe symptoms. ---Caller states she has SOB and increased HR for about a month. States she is losing feeling in her feet as well as swelling. Urinating more frequent than normal. Denies chest pain, fever, or any other symptoms at this time. Does the patient have any new or worsening symptoms? ---Yes Will a triage be completed? ---Yes Related visit to physician within the last 2 weeks? ---No Does the PT have any chronic conditions? (i.e. diabetes, asthma, this includes High risk factors for pregnancy, etc.) ---No Is this a behavioral health or substance abuse call? ---No Guidelines Guideline Title Affirmed Question Affirmed Notes Nurse Date/Time Eilene Ghazi Time) Leg Swelling and Edema Difficulty breathing at rest Jearld Pies, RN, Lovena Le 09/18/2021 10:22:55 AM Disp. Time Eilene Ghazi Time) Disposition Final User 09/18/2021 10:19:52 AM Send to Urgent Queue Linda Hedges, Leilani PLEASE NOTE: All timestamps contained within this report are represented as [place]  Standard Time. CONFIDENTIALTY NOTICE: This fax transmission is intended only for the addressee. It contains information that is legally privileged, confidential or otherwise protected from use or disclosure. If you are not the intended recipient, you are strictly prohibited from reviewing, disclosing, copying using or disseminating any of this information or taking any action in reliance on or regarding this information. If you have received this fax in error, please notify us immediately by telephone so that we can arrange for its return to [place]. Phone: [phone], Toll-Free: [phone], Fax: [phone] Page: 2 of 2 Call Id: [id number] [place]. Time Eilene Ghazi Time) Disposition Final User 09/18/2021 10:23:40 AM Go to ED Now Yes Jearld Pies, RN, Lovena Le Final Disposition 09/18/2021 10:23:40 AM Go to ED Now Yes Jearld Pies, RN, Apolonio Schneiders Disagree/Comply Comply Caller Understands Yes PreDisposition InappropriateToAsk Care Advice Given Per Guideline * Leave now. Drive carefully. * Go to the ED at [facility] Hospital. * You need to be seen in the Emergency Department. GO TO ED NOW: * Another adult should drive. NOTE TO TRIAGER - DRIVING: Comments User: Malissa Hippo, RN Date/Time Eilene Ghazi Time): 09/18/2021 10:26:45 AM Contacted back line and made aware pt had ED dispo and refuses. Pt warm transferred to main line successfully per staff. Referrals GO TO FACILITY REFUSED

## 2021-09-18 NOTE — Telephone Encounter (Signed)
See note

## 2021-09-18 NOTE — Telephone Encounter (Signed)
Patient initially called in regards to communication error but decided to make an OV. Patient stated she had been experiencing shortness of breath and increased heart rate for a month. Currently states she is having swelling in legs as well as decreased sensation in feet. States it feels like "salt in her toes". Due to symptoms, patient has been sent to triage for further evaluation.

## 2021-11-06 ENCOUNTER — Other Ambulatory Visit: Payer: Self-pay | Admitting: Family Medicine

## 2021-11-06 DIAGNOSIS — B0229 Other postherpetic nervous system involvement: Secondary | ICD-10-CM

## 2021-11-12 ENCOUNTER — Other Ambulatory Visit: Payer: Self-pay | Admitting: Family Medicine

## 2021-11-13 ENCOUNTER — Encounter: Payer: Self-pay | Admitting: *Deleted

## 2021-11-20 ENCOUNTER — Ambulatory Visit (INDEPENDENT_AMBULATORY_CARE_PROVIDER_SITE_OTHER): Payer: Medicare Other | Admitting: *Deleted

## 2021-11-20 DIAGNOSIS — Z23 Encounter for immunization: Secondary | ICD-10-CM | POA: Diagnosis not present

## 2021-12-15 ENCOUNTER — Other Ambulatory Visit: Payer: Self-pay | Admitting: Family Medicine

## 2022-01-08 ENCOUNTER — Telehealth: Payer: Self-pay | Admitting: Family Medicine

## 2022-01-08 NOTE — Telephone Encounter (Signed)
Pt states: -Received a MyChart message that Hepatitis C "needs to be updated". -Flu shot received on 11/20/21 at [facility], COVID-19 booster received following day -she does not know which version of the COVID-19 booster she received from [facility] pharmacy.   Pt requests: -follow up from PCP team about Hepatitis C notification and what patient needs to do.

## 2022-01-08 NOTE — Telephone Encounter (Signed)
Spoke with patient, patient aware records updated

## 2022-02-13 ENCOUNTER — Other Ambulatory Visit: Payer: Self-pay | Admitting: Family Medicine

## 2022-02-13 DIAGNOSIS — B0229 Other postherpetic nervous system involvement: Secondary | ICD-10-CM

## 2022-02-24 ENCOUNTER — Other Ambulatory Visit: Payer: Self-pay | Admitting: Family Medicine

## 2022-03-12 ENCOUNTER — Other Ambulatory Visit: Payer: Self-pay | Admitting: Family Medicine

## 2022-03-22 ENCOUNTER — Other Ambulatory Visit: Payer: Self-pay | Admitting: Family Medicine

## 2022-03-29 ENCOUNTER — Other Ambulatory Visit: Payer: Self-pay | Admitting: Family Medicine

## 2022-03-30 ENCOUNTER — Other Ambulatory Visit: Payer: Self-pay | Admitting: Family Medicine

## 2022-03-30 DIAGNOSIS — B0229 Other postherpetic nervous system involvement: Secondary | ICD-10-CM

## 2022-03-31 ENCOUNTER — Other Ambulatory Visit: Payer: Self-pay | Admitting: Family Medicine

## 2022-04-03 ENCOUNTER — Other Ambulatory Visit: Payer: Self-pay | Admitting: Family Medicine

## 2022-04-05 ENCOUNTER — Other Ambulatory Visit: Payer: Self-pay | Admitting: Family Medicine

## 2022-04-06 MED ORDER — CLONAZEPAM 1 MG PO TABS: ORAL_TABLET | ORAL | 0 refills | Status: DC

## 2022-04-06 NOTE — Telephone Encounter (Signed)
Needs office visit for refill on clonazepam

## 2022-04-06 NOTE — Addendum Note (Signed)
Addended by: Vivi Barrack on: 04/06/2022 03:16 PM   Modules accepted: Orders

## 2022-04-06 NOTE — Telephone Encounter (Signed)
Can patient get 15-30 day supply? Scheduled for CPE 04/23/22

## 2022-04-06 NOTE — Telephone Encounter (Signed)
Last OV: 03/21/21 dx. COPD Last refill: 12/18/21 #60, 2

## 2022-04-23 ENCOUNTER — Encounter: Payer: 59 | Admitting: Family Medicine

## 2022-04-27 ENCOUNTER — Encounter: Payer: 59 | Admitting: Family Medicine

## 2022-04-27 ENCOUNTER — Inpatient Hospital Stay (HOSPITAL_COMMUNITY)
Admission: EM | Admit: 2022-04-27 | Discharge: 2022-05-01 | DRG: 641 | Disposition: A | Discharge status: Skilled Nursing Facility | Payer: 59 | Attending: Internal Medicine | Admitting: Internal Medicine

## 2022-04-27 ENCOUNTER — Emergency Department (HOSPITAL_COMMUNITY): Payer: 59

## 2022-04-27 ENCOUNTER — Other Ambulatory Visit: Payer: Self-pay

## 2022-04-27 ENCOUNTER — Encounter (HOSPITAL_COMMUNITY): Payer: Self-pay

## 2022-04-27 DIAGNOSIS — K219 Gastro-esophageal reflux disease without esophagitis: Secondary | ICD-10-CM | POA: Diagnosis not present

## 2022-04-27 DIAGNOSIS — W1830XA Fall on same level, unspecified, initial encounter: Secondary | ICD-10-CM | POA: Diagnosis present

## 2022-04-27 DIAGNOSIS — E876 Hypokalemia: Secondary | ICD-10-CM | POA: Diagnosis not present

## 2022-04-27 DIAGNOSIS — G4700 Insomnia, unspecified: Secondary | ICD-10-CM | POA: Diagnosis not present

## 2022-04-27 DIAGNOSIS — Z5941 Food insecurity: Secondary | ICD-10-CM

## 2022-04-27 DIAGNOSIS — R262 Difficulty in walking, not elsewhere classified: Secondary | ICD-10-CM | POA: Diagnosis not present

## 2022-04-27 DIAGNOSIS — Z4789 Encounter for other orthopedic aftercare: Secondary | ICD-10-CM | POA: Diagnosis not present

## 2022-04-27 DIAGNOSIS — R296 Repeated falls: Secondary | ICD-10-CM | POA: Diagnosis not present

## 2022-04-27 DIAGNOSIS — R2681 Unsteadiness on feet: Secondary | ICD-10-CM | POA: Diagnosis not present

## 2022-04-27 DIAGNOSIS — H8110 Benign paroxysmal vertigo, unspecified ear: Secondary | ICD-10-CM | POA: Insufficient documentation

## 2022-04-27 DIAGNOSIS — S0990XA Unspecified injury of head, initial encounter: Secondary | ICD-10-CM | POA: Diagnosis present

## 2022-04-27 DIAGNOSIS — F17210 Nicotine dependence, cigarettes, uncomplicated: Secondary | ICD-10-CM | POA: Diagnosis not present

## 2022-04-27 DIAGNOSIS — I959 Hypotension, unspecified: Secondary | ICD-10-CM | POA: Diagnosis present

## 2022-04-27 DIAGNOSIS — R42 Dizziness and giddiness: Secondary | ICD-10-CM

## 2022-04-27 DIAGNOSIS — G4489 Other headache syndrome: Secondary | ICD-10-CM | POA: Diagnosis not present

## 2022-04-27 DIAGNOSIS — Z8611 Personal history of tuberculosis: Secondary | ICD-10-CM

## 2022-04-27 DIAGNOSIS — G9341 Metabolic encephalopathy: Secondary | ICD-10-CM | POA: Diagnosis present

## 2022-04-27 DIAGNOSIS — M542 Cervicalgia: Secondary | ICD-10-CM | POA: Diagnosis not present

## 2022-04-27 DIAGNOSIS — Z79899 Other long term (current) drug therapy: Secondary | ICD-10-CM

## 2022-04-27 DIAGNOSIS — S199XXA Unspecified injury of neck, initial encounter: Secondary | ICD-10-CM | POA: Diagnosis not present

## 2022-04-27 DIAGNOSIS — E538 Deficiency of other specified B group vitamins: Principal | ICD-10-CM | POA: Diagnosis present

## 2022-04-27 DIAGNOSIS — M545 Low back pain, unspecified: Secondary | ICD-10-CM | POA: Diagnosis not present

## 2022-04-27 DIAGNOSIS — I10 Essential (primary) hypertension: Secondary | ICD-10-CM | POA: Diagnosis not present

## 2022-04-27 DIAGNOSIS — M1990 Unspecified osteoarthritis, unspecified site: Secondary | ICD-10-CM | POA: Diagnosis present

## 2022-04-27 DIAGNOSIS — R519 Headache, unspecified: Secondary | ICD-10-CM | POA: Diagnosis not present

## 2022-04-27 DIAGNOSIS — K5900 Constipation, unspecified: Secondary | ICD-10-CM | POA: Diagnosis not present

## 2022-04-27 DIAGNOSIS — Z881 Allergy status to other antibiotic agents status: Secondary | ICD-10-CM

## 2022-04-27 DIAGNOSIS — R053 Chronic cough: Secondary | ICD-10-CM | POA: Diagnosis present

## 2022-04-27 DIAGNOSIS — Z7985 Long-term (current) use of injectable non-insulin antidiabetic drugs: Secondary | ICD-10-CM

## 2022-04-27 DIAGNOSIS — B0229 Other postherpetic nervous system involvement: Secondary | ICD-10-CM

## 2022-04-27 DIAGNOSIS — Z981 Arthrodesis status: Secondary | ICD-10-CM

## 2022-04-27 DIAGNOSIS — Z5986 Financial insecurity: Secondary | ICD-10-CM

## 2022-04-27 DIAGNOSIS — F419 Anxiety disorder, unspecified: Secondary | ICD-10-CM | POA: Diagnosis present

## 2022-04-27 DIAGNOSIS — G629 Polyneuropathy, unspecified: Secondary | ICD-10-CM | POA: Diagnosis present

## 2022-04-27 DIAGNOSIS — Z7952 Long term (current) use of systemic steroids: Secondary | ICD-10-CM

## 2022-04-27 DIAGNOSIS — G8929 Other chronic pain: Secondary | ICD-10-CM | POA: Diagnosis present

## 2022-04-27 DIAGNOSIS — S060X0A Concussion without loss of consciousness, initial encounter: Secondary | ICD-10-CM | POA: Diagnosis not present

## 2022-04-27 DIAGNOSIS — Z8249 Family history of ischemic heart disease and other diseases of the circulatory system: Secondary | ICD-10-CM

## 2022-04-27 DIAGNOSIS — F32A Depression, unspecified: Secondary | ICD-10-CM | POA: Diagnosis not present

## 2022-04-27 DIAGNOSIS — Z9181 History of falling: Secondary | ICD-10-CM

## 2022-04-27 DIAGNOSIS — E039 Hypothyroidism, unspecified: Secondary | ICD-10-CM | POA: Diagnosis not present

## 2022-04-27 DIAGNOSIS — E785 Hyperlipidemia, unspecified: Secondary | ICD-10-CM | POA: Diagnosis not present

## 2022-04-27 DIAGNOSIS — R531 Weakness: Secondary | ICD-10-CM | POA: Diagnosis not present

## 2022-04-27 DIAGNOSIS — Z83719 Family history of colon polyps, unspecified: Secondary | ICD-10-CM

## 2022-04-27 DIAGNOSIS — R0902 Hypoxemia: Secondary | ICD-10-CM | POA: Diagnosis not present

## 2022-04-27 DIAGNOSIS — M549 Dorsalgia, unspecified: Secondary | ICD-10-CM | POA: Diagnosis not present

## 2022-04-27 DIAGNOSIS — Z90710 Acquired absence of both cervix and uterus: Secondary | ICD-10-CM

## 2022-04-27 DIAGNOSIS — Z043 Encounter for examination and observation following other accident: Secondary | ICD-10-CM | POA: Diagnosis not present

## 2022-04-27 DIAGNOSIS — M6281 Muscle weakness (generalized): Secondary | ICD-10-CM | POA: Diagnosis not present

## 2022-04-27 LAB — CBC WITH DIFFERENTIAL/PLATELET
Abs Immature Granulocytes: 0.07 10*3/uL (ref 0.00–0.07)
Basophils Absolute: 0.1 10*3/uL (ref 0.0–0.1)
Basophils Relative: 1 %
Eosinophils Absolute: 0 10*3/uL (ref 0.0–0.5)
Eosinophils Relative: 0 %
HCT: 42.8 % (ref 36.0–46.0)
Hemoglobin: 14.1 g/dL (ref 12.0–15.0)
Immature Granulocytes: 1 %
Lymphocytes Relative: 11 %
Lymphs Abs: 1.3 10*3/uL (ref 0.7–4.0)
MCH: 33.7 pg (ref 26.0–34.0)
MCHC: 32.9 g/dL (ref 30.0–36.0)
MCV: 102.4 fL — ABNORMAL HIGH (ref 80.0–100.0)
Monocytes Absolute: 0.8 10*3/uL (ref 0.1–1.0)
Monocytes Relative: 7 %
Neutro Abs: 9.7 10*3/uL — ABNORMAL HIGH (ref 1.7–7.7)
Neutrophils Relative %: 80 %
Platelets: 285 10*3/uL (ref 150–400)
RBC: 4.18 MIL/uL (ref 3.87–5.11)
RDW: 15.3 % (ref 11.5–15.5)
WBC: 12 10*3/uL — ABNORMAL HIGH (ref 4.0–10.5)
nRBC: 0 % (ref 0.0–0.2)

## 2022-04-27 LAB — URINALYSIS, ROUTINE W REFLEX MICROSCOPIC
Bacteria, UA: NONE SEEN
Bilirubin Urine: NEGATIVE
Glucose, UA: NEGATIVE mg/dL
Hgb urine dipstick: NEGATIVE
Ketones, ur: NEGATIVE mg/dL
Nitrite: NEGATIVE
Protein, ur: NEGATIVE mg/dL
Specific Gravity, Urine: 1.005 (ref 1.005–1.030)
pH: 6 (ref 5.0–8.0)

## 2022-04-27 LAB — COMPREHENSIVE METABOLIC PANEL
ALT: 21 U/L (ref 0–44)
AST: 46 U/L — ABNORMAL HIGH (ref 15–41)
Albumin: 3.5 g/dL (ref 3.5–5.0)
Alkaline Phosphatase: 86 U/L (ref 38–126)
Anion gap: 8 (ref 5–15)
BUN: 7 mg/dL — ABNORMAL LOW (ref 8–23)
CO2: 28 mmol/L (ref 22–32)
Calcium: 8.9 mg/dL (ref 8.9–10.3)
Chloride: 97 mmol/L — ABNORMAL LOW (ref 98–111)
Creatinine, Ser: 0.76 mg/dL (ref 0.44–1.00)
GFR, Estimated: >60 mL/min (ref >60)
Glucose, Bld: 118 mg/dL — ABNORMAL HIGH (ref 70–99)
Potassium: 3.4 mmol/L — ABNORMAL LOW (ref 3.5–5.1)
Sodium: 133 mmol/L — ABNORMAL LOW (ref 135–145)
Total Bilirubin: 0.5 mg/dL (ref 0.3–1.2)
Total Protein: 6.6 g/dL (ref 6.5–8.1)

## 2022-04-27 LAB — CBC
HCT: 40 % (ref 36.0–46.0)
Hemoglobin: 13.3 g/dL (ref 12.0–15.0)
MCH: 33.9 pg (ref 26.0–34.0)
MCHC: 33.3 g/dL (ref 30.0–36.0)
MCV: 102 fL — ABNORMAL HIGH (ref 80.0–100.0)
Platelets: 277 10*3/uL (ref 150–400)
RBC: 3.92 MIL/uL (ref 3.87–5.11)
RDW: 15.5 % (ref 11.5–15.5)
WBC: 12.3 10*3/uL — ABNORMAL HIGH (ref 4.0–10.5)
nRBC: 0 % (ref 0.0–0.2)

## 2022-04-27 LAB — CBG MONITORING, ED: Glucose-Capillary: 110 mg/dL — ABNORMAL HIGH (ref 70–99)

## 2022-04-27 LAB — TSH: TSH: 2.268 u[IU]/mL (ref 0.350–4.500)

## 2022-04-27 MED ORDER — DIAZEPAM 5 MG/ML IJ SOLN
2.5000 mg | Freq: Once | INTRAMUSCULAR | Status: AC
  Administered 2022-04-27: 2.5 mg via INTRAVENOUS
  Filled 2022-04-27: qty 2

## 2022-04-27 MED ORDER — MECLIZINE HCL 25 MG PO TABS
25.0000 mg | ORAL_TABLET | Freq: Once | ORAL | Status: AC
  Administered 2022-04-27: 25 mg via ORAL
  Filled 2022-04-27: qty 1

## 2022-04-27 MED ORDER — SODIUM CHLORIDE 0.9 % IV BOLUS
1000.0000 mL | Freq: Once | INTRAVENOUS | Status: AC
  Administered 2022-04-27: 1000 mL via INTRAVENOUS

## 2022-04-27 MED ORDER — ENOXAPARIN SODIUM 40 MG/0.4ML IJ SOSY
40.0000 mg | PREFILLED_SYRINGE | INTRAMUSCULAR | Status: DC
  Administered 2022-04-28 – 2022-05-01 (×4): 40 mg via SUBCUTANEOUS
  Filled 2022-04-27 (×4): qty 0.4

## 2022-04-27 MED ORDER — ONDANSETRON HCL 4 MG/2ML IJ SOLN
4.0000 mg | Freq: Once | INTRAMUSCULAR | Status: AC
  Administered 2022-04-27: 4 mg via INTRAVENOUS
  Filled 2022-04-27: qty 2

## 2022-04-27 MED ORDER — LACTATED RINGERS IV SOLN: INTRAVENOUS | Status: AC

## 2022-04-27 MED ORDER — PROCHLORPERAZINE EDISYLATE 10 MG/2ML IJ SOLN: 5.0000 mg | Freq: Four times a day (QID) | INTRAMUSCULAR | Status: DC | PRN

## 2022-04-27 MED ORDER — MORPHINE SULFATE (PF) 4 MG/ML IV SOLN
4.0000 mg | Freq: Once | INTRAVENOUS | Status: AC
  Administered 2022-04-27: 4 mg via INTRAVENOUS
  Filled 2022-04-27: qty 1

## 2022-04-27 MED ORDER — MECLIZINE HCL 12.5 MG PO TABS
12.5000 mg | ORAL_TABLET | Freq: Three times a day (TID) | ORAL | Status: DC | PRN
  Filled 2022-04-27: qty 1

## 2022-04-27 MED ORDER — POLYETHYLENE GLYCOL 3350 17 G PO PACK
17.0000 g | PACK | Freq: Every day | ORAL | Status: DC | PRN
  Administered 2022-04-30: 17 g via ORAL
  Filled 2022-04-27: qty 1

## 2022-04-27 MED ORDER — ACETAMINOPHEN 325 MG PO TABS
650.0000 mg | ORAL_TABLET | Freq: Four times a day (QID) | ORAL | Status: DC | PRN
  Administered 2022-04-28 – 2022-04-30 (×4): 650 mg via ORAL
  Filled 2022-04-27 (×4): qty 2

## 2022-04-27 MED ORDER — KETOROLAC TROMETHAMINE 30 MG/ML IJ SOLN
30.0000 mg | Freq: Once | INTRAMUSCULAR | Status: AC
  Administered 2022-04-27: 30 mg via INTRAVENOUS
  Filled 2022-04-27: qty 1

## 2022-04-27 NOTE — ED Notes (Signed)
Purewick placed on the pt.

## 2022-04-27 NOTE — ED Notes (Signed)
Pt reports she is claustrophobic when asked about upcoming MRI.  [place] MD notified of same for possible pre-medication.
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
## 2022-04-27 NOTE — ED Triage Notes (Signed)
Pt BIB [facility] from home d/t a fall. Pt has been having leg issues, and today when she was trying to walk, both legs gave up, the pt fell on the floor. Pt walks w the walker at baseline. Pt hit the back of head when she fell. Denied LOC. Not on thinners.Pt c/o headache, upper and lower back pain. C-collar applied by EMS. A&O X4 enroute w EMS.   BP 116/60 PULSE 80 Rr 16  100% RA

## 2022-04-27 NOTE — ED Notes (Signed)
Pt was adjusted in the bed by this RN. After moving pt became dizzy. BP rechecked. Dr. Gilford Raid made aware.

## 2022-04-27 NOTE — ED Provider Notes (Signed)
[facility] Provider Note   CSN: [id number] Arrival date & time: 04/27/22  1449     History {Add pertinent medical, surgical, social history, OB history to HPI:1} Chief Complaint  Patient presents with   Karla Foster is a 67 y.o. female.  Pt is a 67 yo female with pmhx significant for insomnia, chronic back pain, hyperthyroidism, gerd, oa, htn and dyslipidemia.  Pt said she's been having increasing weakness in both legs for the past month.  She has been using a walker.  She said both of her legs gave out while using the walker and she fell.  She did hit the back of her head.  She did not have a loc.  No thinners.  She has back pain as well.        Home Medications Prior to Admission medications   Medication Sig Start Date End Date Taking? Authorizing Provider  albuterol (VENTOLIN HFA) 108 (90 Base) MCG/ACT inhaler TAKE 2 PUFFS BY MOUTH EVERY 6 HOURS AS NEEDED FOR WHEEZE OR SHORTNESS OF BREATH 07/25/21   Vivi Barrack, MD  atorvastatin (LIPITOR) 40 MG tablet TAKE 1 TABLET BY MOUTH EVERY DAY 04/06/22   Vivi Barrack, MD  clonazePAM (KLONOPIN) 1 MG tablet Take 1/2 tablet by mouth in the morning 1/2 tablet at noon, and 1 tablet at bedtime. 04/06/22   Vivi Barrack, MD  doxycycline (VIBRA-TABS) 100 MG tablet Take 1 tablet (100 mg total) by mouth 2 (two) times daily. 03/21/21   Vivi Barrack, MD  DULoxetine (CYMBALTA) 60 MG capsule TAKE 1 CAPSULE (60 MG TOTAL) BY MOUTH AT BEDTIME. 11/13/21   Vivi Barrack, MD  esomeprazole (NEXIUM) 40 MG capsule TAKE 1 CAPSULE (40 MG TOTAL) BY MOUTH DAILY AT 12 NOON. 03/12/22   Vivi Barrack, MD  gabapentin (NEURONTIN) 300 MG capsule TAKE 1 CAPSULE BY MOUTH THREE TIMES A DAY 02/14/22   Vivi Barrack, MD  LINZESS 145 MCG CAPS capsule TAKE 1 CAPSULE BY MOUTH DAILY BEFORE BREAKFAST. 11/13/21   Vivi Barrack, MD  predniSONE (DELTASONE) 50 MG tablet Take 1 tablet daily for 5 days. 03/21/21    Vivi Barrack, MD  Semaglutide-Weight Management [facility]) 0.25 MG/0.5ML SOAJ Inject 0.25 mg into the skin once a week. 03/17/21   Vivi Barrack, MD  trazodone (DESYREL) 300 MG tablet TAKE 1 TABLET BY MOUTH EVERYDAY AT BEDTIME 02/26/22   Vivi Barrack, MD      Allergies    Amoxicillin    Review of Systems   Review of Systems  Musculoskeletal:  Positive for back pain.  Neurological:  Positive for weakness and headaches.  All other systems reviewed and are negative.   Physical Exam Updated Vital Signs BP (!) 113/59   Pulse 83   Temp 98.3 F (36.8 C) (Oral)   Resp 18   SpO2 98%  Physical Exam Vitals and nursing note reviewed.  Constitutional:      Appearance: Normal appearance.  HENT:     Head: Normocephalic and atraumatic.     Right Ear: External ear normal.     Left Ear: External ear normal.     Nose: Nose normal.     Mouth/Throat:     Mouth: Mucous membranes are moist.     Pharynx: Oropharynx is clear.  Eyes:     Extraocular Movements: Extraocular movements intact.     Conjunctiva/sclera: Conjunctivae normal.     Pupils:  Pupils are equal, round, and reactive to light.  Neck:     Comments: In c-collar Cardiovascular:     Rate and Rhythm: Normal rate and regular rhythm.     Pulses: Normal pulses.     Heart sounds: Normal heart sounds.  Pulmonary:     Effort: Pulmonary effort is normal.     Breath sounds: Normal breath sounds.  Abdominal:     General: Abdomen is flat. Bowel sounds are normal.     Palpations: Abdomen is soft.  Musculoskeletal:       Back:  Skin:    General: Skin is warm.     Capillary Refill: Capillary refill takes less than 2 seconds.  Neurological:     General: No focal deficit present.     Mental Status: She is alert and oriented to person, place, and time.  Psychiatric:        Mood and Affect: Mood normal.        Behavior: Behavior normal.     ED Results / Procedures / Treatments   Labs (all labs ordered are listed, but only  abnormal results are displayed) Labs Reviewed  CBC WITH DIFFERENTIAL/PLATELET - Abnormal; Notable for the following components:      Result Value   WBC 12.0 (*)    MCV 102.4 (*)    Neutro Abs 9.7 (*)    All other components within normal limits  COMPREHENSIVE METABOLIC PANEL - Abnormal; Notable for the following components:   Sodium 133 (*)    Potassium 3.4 (*)    Chloride 97 (*)    Glucose, Bld 118 (*)    BUN 7 (*)    AST 46 (*)    All other components within normal limits  URINALYSIS, ROUTINE W REFLEX MICROSCOPIC - Abnormal; Notable for the following components:   Leukocytes,Ua TRACE (*)    All other components within normal limits  CBG MONITORING, ED - Abnormal; Notable for the following components:   Glucose-Capillary 110 (*)    All other components within normal limits  TSH    EKG EKG Interpretation  Date/Time:  Friday April 27 2022 15:19:06 EST Ventricular Rate:  73 PR Interval:  140 QRS Duration: 102 QT Interval:  392 QTC Calculation: 432 R Axis:   33 Text Interpretation: Sinus rhythm Atrial premature complex Since last tracing rate slower Confirmed by Isla Pence [id number]) on 04/27/2022 3:28:49 PM  Radiology MR BRAIN WO CONTRAST  Result Date: 04/27/2022 CLINICAL DATA:  Fall, headache, stroke suspected EXAM: MRI HEAD WITHOUT CONTRAST TECHNIQUE: Multiplanar, multiecho pulse sequences of the brain and surrounding structures were obtained without intravenous contrast. COMPARISON:  06/08/2020, correlation is also made with same day CT head FINDINGS: Brain: No restricted diffusion to suggest acute or subacute infarct. No acute hemorrhage, mass, mass effect, or midline shift. No hydrocephalus or extra-axial collection. Empty sella. Normal craniocervical junction. Mildly advanced cerebral atrophy for age. Scattered T2 hyperintense signal in the periventricular white matter, likely the sequela of minimal chronic small vessel ischemic disease. No hemosiderin deposition to suggest  remote hemorrhage. Vascular: Normal arterial flow voids. Skull and upper cervical spine: Normal marrow signal. Sinuses/Orbits: Clear paranasal sinuses. Vertically tortuous optic nerves. Status post bilateral lens replacements. Other: The mastoid air cells are well aerated. IMPRESSION: 1. No acute intracranial process. No evidence of acute or subacute infarct. 2. Empty sella and tortuous optic nerves, which are nonspecific but can be seen in the setting of idiopathic intracranial hypertension. Electronically Signed   By: Merilyn Baba  M.D.   On: 04/27/2022 21:57   CT Lumbar Spine Wo Contrast  Result Date: 04/27/2022 CLINICAL DATA:  Lumbar radiculopathy, trauma. Both legs gave out today resulting in a fall. EXAM: CT LUMBAR SPINE WITHOUT CONTRAST TECHNIQUE: Multidetector CT imaging of the lumbar spine was performed without intravenous contrast administration. Multiplanar CT image reconstructions were also generated. RADIATION DOSE REDUCTION: This exam was performed according to the departmental dose-optimization program which includes automated exposure control, adjustment of the mA and/or kV according to patient size and/or use of iterative reconstruction technique. COMPARISON:  MRI lumbar spine 06/08/2020. CT lumbar spine 07/10/2016. Lumbar spine radiographs 06/28/2016 FINDINGS: Segmentation: 5 lumbar type vertebral bodies. Alignment: Mild retrolisthesis of L1, unchanged since prior studies. Otherwise normal alignment. Vertebrae: There is an old compression fracture of the L1 vertebra with loss of height unchanged since prior study. Since the prior CT and plain films, there has been interval postoperative change with placement of hardware spacer at L1. Posterior rod and screw fixation beginning above the T11 level and extending to the L3 level. Surgical hardware as visualized appears intact without evidence of significant loosening. Bony fusion of the posterior elements. No new acute or displaced fractures are  identified. Paraspinal and other soft tissues: No abnormal paraspinal soft tissue mass or infiltration. Disc levels: Degenerative changes with mild disc space narrowing and osteophyte formation most prominent at L4-5, where there is mild degenerative disc disease. IMPRESSION: 1. No acute displaced fractures are identified. 2. Old compression deformity of L1. 3. Postoperative changes with hardware spacer at L1, posterior rod and screw fixation from the thoracic spine to L3, and posterior bony fusion. Electronically Signed   By: Lucienne Capers M.D.   On: 04/27/2022 16:47   CT Cervical Spine Wo Contrast  Result Date: 04/27/2022 CLINICAL DATA:  Neck trauma.  Patient fell today. EXAM: CT CERVICAL SPINE WITHOUT CONTRAST TECHNIQUE: Multidetector CT imaging of the cervical spine was performed without intravenous contrast. Multiplanar CT image reconstructions were also generated. RADIATION DOSE REDUCTION: This exam was performed according to the departmental dose-optimization program which includes automated exposure control, adjustment of the mA and/or kV according to patient size and/or use of iterative reconstruction technique. COMPARISON:  MRI cervical spine 06/08/2020. Cervical spine radiographs 03/30/2006 FINDINGS: Alignment: Normal. Skull base and vertebrae: No acute fracture. No primary bone lesion or focal pathologic process. Soft tissues and spinal canal: No prevertebral fluid or swelling. No visible canal hematoma. Disc levels: Degenerative changes with disc space narrowing and mild endplate osteophyte formation throughout. Upper chest: Lung apices are clear. Other: None. IMPRESSION: Normal alignment of the cervical spine. No acute displaced fractures. Mild degenerative changes. Electronically Signed   By: Lucienne Capers M.D.   On: 04/27/2022 16:42   CT Head Wo Contrast  Result Date: 04/27/2022 CLINICAL DATA:  Posttraumatic headache. Fell today. Patient struck the back of head. EXAM: CT HEAD WITHOUT  CONTRAST TECHNIQUE: Contiguous axial images were obtained from the base of the skull through the vertex without intravenous contrast. RADIATION DOSE REDUCTION: This exam was performed according to the departmental dose-optimization program which includes automated exposure control, adjustment of the mA and/or kV according to patient size and/or use of iterative reconstruction technique. COMPARISON:  MRI brain 06/08/2020.  CT head 04/10/2006 FINDINGS: Brain: No evidence of acute infarction, hemorrhage, hydrocephalus, extra-axial collection or mass lesion/mass effect. Mild cerebral atrophy. Vascular: No hyperdense vessel or unexpected calcification. Skull: Normal. Negative for fracture or focal lesion. Sinuses/Orbits: Paranasal sinuses and mastoid air cells are clear except for  a small retention cyst in the right maxillary antrum. Other: None. IMPRESSION: No acute intracranial abnormalities. Electronically Signed   By: Lucienne Capers M.D.   On: 04/27/2022 16:39   DG Chest Portable 1 View  Result Date: 04/27/2022 CLINICAL DATA:  Fall. EXAM: PORTABLE CHEST 1 VIEW COMPARISON:  Chest radiographs 02/02/2019 FINDINGS: The cardiac silhouette is mildly enlarged. The lungs are hypoinflated with bronchovascular crowding in the lung bases. No overt pulmonary edema, segmental airspace consolidation, pneumothorax, or sizable pleural effusion is identified (with note made of incomplete imaging of the left lateral costophrenic angle). No acute osseous abnormality is seen. IMPRESSION: Cardiomegaly and hypoinflation. Electronically Signed   By: Logan Bores M.D.   On: 04/27/2022 15:42   DG Pelvis Portable  Result Date: 04/27/2022 CLINICAL DATA:  Fall EXAM: PORTABLE PELVIS 1-2 VIEWS COMPARISON:  10/22/2017 FINDINGS: There is no evidence of pelvic fracture or diastasis. Hip joints intact without evidence of fracture or dislocation. No pelvic bone lesions are seen. IMPRESSION: Negative. Electronically Signed   By: Davina Poke D.O.   On: 04/27/2022 15:41    Procedures Procedures  {Document cardiac monitor, telemetry assessment procedure when appropriate:1}  Medications Ordered in ED Medications  ketorolac (TORADOL) 30 MG/ML injection 30 mg (has no administration in time range)  morphine (PF) 4 MG/ML injection 4 mg (4 mg Intravenous Given 04/27/22 1713)  ondansetron (ZOFRAN) injection 4 mg (4 mg Intravenous Given 04/27/22 1713)  sodium chloride 0.9 % bolus 1,000 mL (0 mLs Intravenous Stopped 04/27/22 1910)  meclizine (ANTIVERT) tablet 25 mg (25 mg Oral Given 04/27/22 2049)  diazepam (VALIUM) injection 2.5 mg (2.5 mg Intravenous Given 04/27/22 2103)    ED Course/ Medical Decision Making/ A&P   {   Click here for ABCD2, HEART and other calculatorsREFRESH Note before signing :1}                          Medical Decision Making Amount and/or Complexity of Data Reviewed Labs: ordered. Radiology: ordered.  Risk Prescription drug management.   This patient presents to the ED for concern of fall, weakness, this involves an extensive number of treatment options, and is a complaint that carries with it a high risk of complications and morbidity.  The differential diagnosis includes multiple trauma, spinal stenosis   Co morbidities that complicate the patient evaluation  nsomnia, chronic back pain, hyperthyroidism, gerd, oa, htn and dyslipidemia   Additional history obtained:  Additional history obtained from epic chart review External records from outside source obtained and reviewed including EMS report   Lab Tests:  I Ordered, and personally interpreted labs.  The pertinent results include:  cbc with wbc sl elevated at 12, cmp nl, ua nl, tsh 2.268   Imaging Studies ordered:  I ordered imaging studies including cxr, pelvis, ct head, c-spine, and ct lumbar  I independently visualized and interpreted imaging which showed  CXR: Cardiomegaly and hypoinflation.  Pelvis: Neg CT head: No acute  intracranial abnormalities.  CT cspine: Normal alignment of the cervical spine. No acute displaced  fractures. Mild degenerative changes.  CT lumbar spine:  No acute displaced fractures are identified.  2. Old compression deformity of L1.  3. Postoperative changes with hardware spacer at L1, posterior rod  and screw fixation from the thoracic spine to L3, and posterior bony  fusion.  MRI brain:  No acute intracranial process. No evidence of acute or subacute  infarct.  2. Empty sella and tortuous optic nerves,  which are nonspecific but  can be seen in the setting of idiopathic intracranial hypertension.  MRI lumbar:  Posterior fusion from the lower thoracic spine through L3 and  interbody cage at remote L1 fracture, which appear unchanged from  prior exam. Chronic retropulsion at the posterosuperior aspect of L1  causes mild spinal canal stenosis at T12-L1 and may contact the  conus, unchanged.  2. L4-L5 mild-to-moderate bilateral neural foraminal narrowing,  unchanged.  3. Narrowing of the lateral recesses at L4-L5 and L5-S1 could affect  the descending L5 and S1 nerve roots, respectively   I agree with the radiologist interpretation   Cardiac Monitoring:  The patient was maintained on a cardiac monitor.  I personally viewed and interpreted the cardiac monitored which showed an underlying rhythm of: nsr   Medicines ordered and prescription drug management:  I ordered medication including morphine/zofran  for pain  Reevaluation of the patient after these medicines showed that the patient improved I have reviewed the patients home medicines and have made adjustments as needed   Test Considered:  mri   Critical Interventions:  meds   Consultations Obtained:  I requested consultation with the ***,  and discussed lab and imaging findings as well as pertinent plan - they recommend: ***   Problem List / ED Course:  Dizziness:  it is unclear if she was dizzy before  she fell or if she is dizzy from concussion.  However, pt's MRI neg, so she has not had a stroke.  Unfortunately, pt is still unable to stand up.  Just moving in bed makes her dizzy.  She is no better after antivert and valium.   Reevaluation:  After the interventions noted above, I reevaluated the patient and found that they have :improved   Social Determinants of Health:  Lives at home   Dispostion:  After consideration of the diagnostic results and the patients response to treatment, I feel that the patent would benefit from admission.    {Document critical care time when appropriate:1} {Document review of labs and clinical decision tools ie heart score, Chads2Vasc2 etc:1}  {Document your independent review of radiology images, and any outside records:1} {Document your discussion with family members, caretakers, and with consultants:1} {Document social determinants of health affecting pt's care:1} {Document your decision making why or why not admission, treatments were needed:1} Final Clinical Impression(s) / ED Diagnoses Final diagnoses:  None    Rx / DC Orders ED Discharge Orders     None
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
## 2022-04-27 NOTE — ED Notes (Signed)
Renata Caprice Winfree patients granddaughter called and said to call for an update and that she may be able to pick up the patient at discharge, or possible arrange transportation.  [phone]

## 2022-04-27 NOTE — ED Notes (Signed)
Pt being transferred to MRI at this time.

## 2022-04-28 ENCOUNTER — Other Ambulatory Visit: Payer: Self-pay | Admitting: Family Medicine

## 2022-04-28 DIAGNOSIS — E876 Hypokalemia: Secondary | ICD-10-CM | POA: Diagnosis not present

## 2022-04-28 DIAGNOSIS — E785 Hyperlipidemia, unspecified: Secondary | ICD-10-CM

## 2022-04-28 DIAGNOSIS — K219 Gastro-esophageal reflux disease without esophagitis: Secondary | ICD-10-CM

## 2022-04-28 DIAGNOSIS — H8110 Benign paroxysmal vertigo, unspecified ear: Secondary | ICD-10-CM | POA: Diagnosis not present

## 2022-04-28 LAB — HIV ANTIBODY (ROUTINE TESTING W REFLEX): HIV Screen 4th Generation wRfx: NONREACTIVE

## 2022-04-28 LAB — BASIC METABOLIC PANEL
Anion gap: 7 (ref 5–15)
BUN: 6 mg/dL — ABNORMAL LOW (ref 8–23)
CO2: 27 mmol/L (ref 22–32)
Calcium: 8.4 mg/dL — ABNORMAL LOW (ref 8.9–10.3)
Chloride: 104 mmol/L (ref 98–111)
Creatinine, Ser: 0.74 mg/dL (ref 0.44–1.00)
GFR, Estimated: >60 mL/min (ref >60)
Glucose, Bld: 100 mg/dL — ABNORMAL HIGH (ref 70–99)
Potassium: 3.4 mmol/L — ABNORMAL LOW (ref 3.5–5.1)
Sodium: 138 mmol/L (ref 135–145)

## 2022-04-28 LAB — PHOSPHORUS: Phosphorus: 3.7 mg/dL (ref 2.5–4.6)

## 2022-04-28 LAB — CBC
HCT: 37.3 % (ref 36.0–46.0)
Hemoglobin: 12.4 g/dL (ref 12.0–15.0)
MCH: 34 pg (ref 26.0–34.0)
MCHC: 33.2 g/dL (ref 30.0–36.0)
MCV: 102.2 fL — ABNORMAL HIGH (ref 80.0–100.0)
Platelets: 255 10*3/uL (ref 150–400)
RBC: 3.65 MIL/uL — ABNORMAL LOW (ref 3.87–5.11)
RDW: 15.3 % (ref 11.5–15.5)
WBC: 9.8 10*3/uL (ref 4.0–10.5)
nRBC: 0 % (ref 0.0–0.2)

## 2022-04-28 LAB — MAGNESIUM: Magnesium: 2 mg/dL (ref 1.7–2.4)

## 2022-04-28 LAB — CREATININE, SERUM
Creatinine, Ser: 0.76 mg/dL (ref 0.44–1.00)
GFR, Estimated: >60 mL/min (ref >60)

## 2022-04-28 MED ORDER — LACTATED RINGERS IV BOLUS
1000.0000 mL | Freq: Once | INTRAVENOUS | Status: AC
  Administered 2022-04-28: 1000 mL via INTRAVENOUS

## 2022-04-28 MED ORDER — ALBUTEROL SULFATE (2.5 MG/3ML) 0.083% IN NEBU: 2.5000 mg | INHALATION_SOLUTION | RESPIRATORY_TRACT | Status: DC | PRN

## 2022-04-28 MED ORDER — DULOXETINE HCL 60 MG PO CPEP
60.0000 mg | ORAL_CAPSULE | Freq: Every day | ORAL | Status: DC
  Administered 2022-04-28 – 2022-05-01 (×4): 60 mg via ORAL
  Filled 2022-04-28 (×4): qty 1

## 2022-04-28 MED ORDER — POTASSIUM CHLORIDE CRYS ER 20 MEQ PO TBCR
40.0000 meq | EXTENDED_RELEASE_TABLET | Freq: Once | ORAL | Status: AC
  Administered 2022-04-28: 40 meq via ORAL
  Filled 2022-04-28: qty 2

## 2022-04-28 MED ORDER — ATORVASTATIN CALCIUM 40 MG PO TABS
40.0000 mg | ORAL_TABLET | Freq: Every day | ORAL | Status: DC
  Administered 2022-04-28 – 2022-05-01 (×4): 40 mg via ORAL
  Filled 2022-04-28 (×4): qty 1

## 2022-04-28 MED ORDER — GABAPENTIN 100 MG PO CAPS
200.0000 mg | ORAL_CAPSULE | Freq: Three times a day (TID) | ORAL | Status: DC
  Administered 2022-04-28 – 2022-05-01 (×11): 200 mg via ORAL
  Filled 2022-04-28 (×11): qty 2

## 2022-04-28 NOTE — Evaluation (Addendum)
Physical Therapy Evaluation Patient Details Name: Karla Foster MRN: [id number] DOB: [birth year] Today's Date: 04/28/2022  History of Present Illness  Pt is a 67 y.o. F who presents 04/27/2022 with increasing weakness and fall. Chest x-ray with cardiomegaly and hyperinflation. CTH negative for acute abnormality. Significant PMH: chronic back pain, OA, HTN, intermittent dizziness x 1 year.  Clinical Impression  PTA, pt lives with her spouse and is independent with mobility using a RW. Pt denies history of falls, other than one prior to admission. On PT evaluation, pt negative for orthostatic hypotension. Of note, pt with desat to 86% on RA, placed on 2L O2 with rebound to 97%. With transition to sitting or standing, pt describes "windshield wiper," visual sensation which lasts several minutes in duration. Pt with left beating nystagmus with smooth pursuits. Head impulse test negative. Performed Marye Round to left; pt with no nystagmus, but + dizziness. Treated with Epley maneuver. Pt reported mild improvement in symptoms. Transitioned to chair with min guard-min assist. Will continue to follow acutely.  Orthostatic Vital Signs: Supine: 78/47 (59) Sitting: 105/76 Standing: 111/91     Recommendations for follow up therapy are one component of a multi-disciplinary discharge planning process, led by the attending physician.  Recommendations may be updated based on patient status, additional functional criteria and insurance authorization.  Follow Up Recommendations Home health PT      Assistance Recommended at Discharge PRN  Patient can return home with the following  A little help with walking and/or transfers;A little help with bathing/dressing/bathroom;Assistance with cooking/housework;Assist for transportation;Help with stairs or ramp for entrance    Equipment Recommendations None recommended by PT  Recommendations for Other Services       Functional Status Assessment Patient has  had a recent decline in their functional status and demonstrates the ability to make significant improvements in function in a reasonable and predictable amount of time.     Precautions / Restrictions Precautions Precautions: Fall Precaution Comments: watch O2 Restrictions Weight Bearing Restrictions: No      Mobility  Bed Mobility Overal bed mobility: Needs Assistance Bed Mobility: Supine to Sit     Supine to sit: Min guard, Min assist     General bed mobility comments: Min guard for first trial; minA on second trial post Epley maneuver    Transfers Overall transfer level: Needs assistance Equipment used: Rolling walker (2 wheels) Transfers: Sit to/from Stand, Bed to chair/wheelchair/BSC Sit to Stand: Min assist   Step pivot transfers: Min guard       General transfer comment: Light minA to rise from edge of bed and recliner, min guard for pivotal steps over to chair    Ambulation/Gait                  Stairs            Wheelchair Mobility    Modified Rankin (Stroke Patients Only)       Balance Overall balance assessment: Mild deficits observed, not formally tested                                           Pertinent Vitals/Pain Pain Assessment Pain Assessment: No/denies pain    Home Living Family/patient expects to be discharged to:: Private residence Living Arrangements: Spouse/significant other Available Help at Discharge: Family Type of Home: Apartment Home Access: Level entry  Home Layout: One level Home Equipment: Conservation officer, nature (2 wheels)      Prior Function Prior Level of Function : Independent/Modified Independent             Mobility Comments: uses RW ADLs Comments: Pt spouse does laundry due to pt hx of back surgery     Hand Dominance        Extremity/Trunk Assessment   Upper Extremity Assessment Upper Extremity Assessment: Defer to OT evaluation    Lower Extremity  Assessment Lower Extremity Assessment: Generalized weakness    Cervical / Trunk Assessment Cervical / Trunk Assessment: Kyphotic  Communication   Communication: No difficulties  Cognition Arousal/Alertness: Awake/alert Behavior During Therapy: WFL for tasks assessed/performed Overall Cognitive Status: Within Functional Limits for tasks assessed                                          General Comments      Exercises     Assessment/Plan    PT Assessment Patient needs continued PT services  PT Problem List Decreased strength;Decreased activity tolerance;Decreased mobility;Decreased balance       PT Treatment Interventions DME instruction;Gait training;Functional mobility training;Therapeutic activities;Therapeutic exercise;Balance training;Patient/family education    PT Goals (Current goals can be found in the Care Plan section)  Acute Rehab PT Goals Patient Stated Goal: feel better PT Goal Formulation: With patient Time For Goal Achievement: 05/12/22 Potential to Achieve Goals: Good    Frequency Min 3X/week     Co-evaluation               AM-PAC PT "6 Clicks" Mobility  Outcome Measure Help needed turning from your back to your side while in a flat bed without using bedrails?: None Help needed moving from lying on your back to sitting on the side of a flat bed without using bedrails?: A Little Help needed moving to and from a bed to a chair (including a wheelchair)?: A Little Help needed standing up from a chair using your arms (e.g., wheelchair or bedside chair)?: A Little Help needed to walk in hospital room?: A Little Help needed climbing 3-5 steps with a railing? : A Lot 6 Click Score: 18    End of Session Equipment Utilized During Treatment: Gait belt;Oxygen Activity Tolerance: Patient tolerated treatment well Patient left: in chair;with call bell/phone within reach;with chair alarm set Nurse Communication: Mobility status PT Visit  Diagnosis: Unsteadiness on feet (R26.81);History of falling (Z91.81);BPPV BPPV - Right/Left : Left    Time: 0822-0858 PT Time Calculation (min) (ACUTE ONLY): 36 min   Charges:   PT Evaluation $PT Eval Moderate Complexity: 1 Mod PT Treatments $Canalith Rep Proc: 8-22 mins        Wyona Almas, PT, DPT Acute Rehabilitation Services Office [phone]   Deno Etienne 04/28/2022, 9:07 AM

## 2022-04-28 NOTE — Progress Notes (Signed)
Pt admitted from ED with c/o of fall at home, pt alert and oriented, c/o of back pain, settled in bed with call light at bedside, tele monitor put and verified on pt, safety concern initiated as well, pt reassured and will continue to monitor, v/s stable. Obasogie-Asidi, Zoua Caporaso Efe

## 2022-04-28 NOTE — Progress Notes (Addendum)
PROGRESS NOTE    Karla Foster  [id number] DOB: 1955-10-21 DOA: 04/27/2022 PCP: Vivi Barrack, MD    Brief Narrative:   Karla Foster is a 67 y.o. female with medical history significant for insomnia, chronic back pain, hypothyroidism, GERD, OA, hypertension, hyperlipidemia, intermittent dizziness x 1 year, presented to hospital with increasing weakness in his legs with fall.  In the ED, vitals were stable.  Labs showed potassium of 3.4 with sodium of 133.  WBC was 12.3.  Chest x-ray with some cardiomegaly.  Urinalysis was negative for infection.  Patient was very dizzy.  Workup in the ED was unremarkable including MRI of the brain.  When she was getting ready to be discharged she was unable to ambulate and was very dizzy so patient was considered for admission to hospital.    Assessment and plan.  Dizziness, suspected BPPV Low blood pressure noted.  Continue PT OT fall precaution.  Meclizine as needed.  MRI negative for acute finding but empty sella.  Orthostatic vitals were negative but will take orthostatic precautions.  Elastic compression bandage.  Patient was advised orthostatic precautions.  Physical debility and fall  PT OT recommendations recommend home health PT on discharge.  Patient has generalized weakness fatigue at this time..   Hyperlipidemia Continue Lipitor   GERD Continue PPI   Chronic anxiety/depression Continue Cymbalta.  Klonopin on hold due to recent falls.   Polyneuropathy Continue gabapentin.  Will continue physical therapy while in the hospital.  Hypokalemia.  Mild will replenish orally.  Check levels in AM.  Hypotension.  Could be secondary to volume depletion.  Continue with Ringer lactate will increase to 100 mill per hour.   DVT prophylaxis: enoxaparin (LOVENOX) injection 40 mg Start: 04/28/22 1000   Code Status:     Code Status: Full Code  Disposition: Home with home health likely on 04/29/2022.  Status is:  Observation  The patient will require care spanning > 2 midnights and should be moved to inpatient because: Persistent dizziness, generalized weakness, pending clinical improvement.   Family Communication: None at bedside.  Consultants:  None  Procedures:  Epley maneuver  Antimicrobials:  None  Anti-infectives (From admission, onward)    None       Subjective: Today, patient was seen and examined at bedside.  States that her dizziness is persistent and nursing staff reported hypotension.  Receiving IV fluids.  Has some nausea.  Feels generalized weakness.  Objective: Vitals:   04/28/22 0404 04/28/22 0446 04/28/22 0609 04/28/22 1100  BP: (!) 78/55 (!) 94/44 (!) 87/35 107/80  Pulse:   76 82  Resp:   18 16  Temp: 97.7 F (36.5 C)  97.8 F (36.6 C) 98 F (36.7 C)  TempSrc: Oral  Oral Oral  SpO2:   99% 100%    Intake/Output Summary (Last 24 hours) at 04/28/2022 1131 Last data filed at 04/28/2022 0900 Gross per 24 hour  Intake 642.51 ml  Output --  Net 642.51 ml   There were no vitals filed for this visit.  Physical Examination: There is no height or weight on file to calculate BMI.   General:  Average built, not in obvious distress HENT:   No scleral pallor or icterus noted. Oral mucosa with  dry lips. Chest:    Diminished breath sounds bilaterally. No crackles or wheezes.  CVS: S1 &S2 heard. No murmur.  Regular rate and rhythm. Abdomen: Soft, nontender, nondistended.  Bowel sounds are heard.   Extremities: No cyanosis, clubbing or  edema.  Peripheral pulses are palpable. Psych: Alert, awake and oriented, normal mood CNS:  No cranial nerve deficits.  Moves all extremities. Skin: Warm and dry.  No rashes noted.  Data Reviewed:   CBC: Recent Labs  Lab 04/27/22 1504 04/27/22 2325 04/28/22 0410  WBC 12.0* 12.3* 9.8  NEUTROABS 9.7*  --   --   HGB 14.1 13.3 12.4  HCT 42.8 40.0 37.3  MCV 102.4* 102.0* 102.2*  PLT 285 277 [id number]    Basic Metabolic  Panel: Recent Labs  Lab 04/27/22 1504 04/27/22 2325 04/28/22 0410  NA 133*  --  138  K 3.4*  --  3.4*  CL 97*  --  104  CO2 28  --  27  GLUCOSE 118*  --  100*  BUN 7*  --  6*  CREATININE 0.76 0.76 0.74  CALCIUM 8.9  --  8.4*  MG  --   --  2.0  PHOS  --   --  3.7    Liver Function Tests: Recent Labs  Lab 04/27/22 1504  AST 46*  ALT 21  ALKPHOS 86  BILITOT 0.5  PROT 6.6  ALBUMIN 3.5     Radiology Studies: MR LUMBAR SPINE WO CONTRAST  Result Date: 04/27/2022 CLINICAL DATA:  Low back pain, prior surgery, new symptoms EXAM: MRI LUMBAR SPINE WITHOUT CONTRAST TECHNIQUE: Multiplanar, multisequence MR imaging of the lumbar spine was performed. No intravenous contrast was administered. COMPARISON:  06/08/2020 MRI lumbar spine FINDINGS: Segmentation:  5 lumbar type vertebral bodies. Alignment:  Mild dextrocurvature.  No significant listhesis. Vertebrae: Redemonstrated L1 compression fracture with interbody cage and posterior fixation visualized at T12-L3, but noted to extend superiorly into the lower thoracic spine. Susceptibility artifact from the hardware limits evaluation these levels. Otherwise normal marrow signal. No acute fracture is seen. No suspicious osseous lesion or evidence of discitis. Conus medullaris and cauda equina: Conus extends to the L2 level. Conus and cauda equina appear normal. Paraspinal and other soft tissues: Right extrarenal pelvis. No lymphadenopathy. Disc levels: T12-L1: Unchanged retropulsion at the posterosuperior aspect of L1, which indents the thecal sac and may contact the ventral aspect of the conus. Mild spinal canal stenosis, unchanged. No neural foraminal narrowing. L1-L2: Posterior fusion. No spinal canal stenosis or neural foraminal narrowing. L2-L3: Minimal disc bulge. Posterior fusion. No spinal canal stenosis or neural foraminal narrowing. L3-L4: No significant disc bulge. No spinal canal stenosis or neural foraminal narrowing. L4-L5: Mild disc bulge.  Mild facet arthropathy. No spinal canal stenosis. Narrowing of the lateral recesses. Mild-to-moderate bilateral neural foraminal narrowing, unchanged. L5-S1: Minimal disc bulge. Narrowing of the lateral recesses. No spinal canal stenosis or neural foraminal narrowing. IMPRESSION: 1. Posterior fusion from the lower thoracic spine through L3 and interbody cage at remote L1 fracture, which appear unchanged from prior exam. Chronic retropulsion at the posterosuperior aspect of L1 causes mild spinal canal stenosis at T12-L1 and may contact the conus, unchanged. 2. L4-L5 mild-to-moderate bilateral neural foraminal narrowing, unchanged. 3. Narrowing of the lateral recesses at L4-L5 and L5-S1 could affect the descending L5 and S1 nerve roots, respectively Electronically Signed   By: Merilyn Baba M.D.   On: 04/27/2022 22:20   MR BRAIN WO CONTRAST  Result Date: 04/27/2022 CLINICAL DATA:  Fall, headache, stroke suspected EXAM: MRI HEAD WITHOUT CONTRAST TECHNIQUE: Multiplanar, multiecho pulse sequences of the brain and surrounding structures were obtained without intravenous contrast. COMPARISON:  06/08/2020, correlation is also made with same day CT head FINDINGS: Brain: No restricted diffusion  to suggest acute or subacute infarct. No acute hemorrhage, mass, mass effect, or midline shift. No hydrocephalus or extra-axial collection. Empty sella. Normal craniocervical junction. Mildly advanced cerebral atrophy for age. Scattered T2 hyperintense signal in the periventricular white matter, likely the sequela of minimal chronic small vessel ischemic disease. No hemosiderin deposition to suggest remote hemorrhage. Vascular: Normal arterial flow voids. Skull and upper cervical spine: Normal marrow signal. Sinuses/Orbits: Clear paranasal sinuses. Vertically tortuous optic nerves. Status post bilateral lens replacements. Other: The mastoid air cells are well aerated. IMPRESSION: 1. No acute intracranial process. No evidence of  acute or subacute infarct. 2. Empty sella and tortuous optic nerves, which are nonspecific but can be seen in the setting of idiopathic intracranial hypertension. Electronically Signed   By: Merilyn Baba M.D.   On: 04/27/2022 21:57   CT Lumbar Spine Wo Contrast  Result Date: 04/27/2022 CLINICAL DATA:  Lumbar radiculopathy, trauma. Both legs gave out today resulting in a fall. EXAM: CT LUMBAR SPINE WITHOUT CONTRAST TECHNIQUE: Multidetector CT imaging of the lumbar spine was performed without intravenous contrast administration. Multiplanar CT image reconstructions were also generated. RADIATION DOSE REDUCTION: This exam was performed according to the departmental dose-optimization program which includes automated exposure control, adjustment of the mA and/or kV according to patient size and/or use of iterative reconstruction technique. COMPARISON:  MRI lumbar spine 06/08/2020. CT lumbar spine 07/10/2016. Lumbar spine radiographs 06/28/2016 FINDINGS: Segmentation: 5 lumbar type vertebral bodies. Alignment: Mild retrolisthesis of L1, unchanged since prior studies. Otherwise normal alignment. Vertebrae: There is an old compression fracture of the L1 vertebra with loss of height unchanged since prior study. Since the prior CT and plain films, there has been interval postoperative change with placement of hardware spacer at L1. Posterior rod and screw fixation beginning above the T11 level and extending to the L3 level. Surgical hardware as visualized appears intact without evidence of significant loosening. Bony fusion of the posterior elements. No new acute or displaced fractures are identified. Paraspinal and other soft tissues: No abnormal paraspinal soft tissue mass or infiltration. Disc levels: Degenerative changes with mild disc space narrowing and osteophyte formation most prominent at L4-5, where there is mild degenerative disc disease. IMPRESSION: 1. No acute displaced fractures are identified. 2. Old  compression deformity of L1. 3. Postoperative changes with hardware spacer at L1, posterior rod and screw fixation from the thoracic spine to L3, and posterior bony fusion. Electronically Signed   By: Lucienne Capers M.D.   On: 04/27/2022 16:47   CT Cervical Spine Wo Contrast  Result Date: 04/27/2022 CLINICAL DATA:  Neck trauma.  Patient fell today. EXAM: CT CERVICAL SPINE WITHOUT CONTRAST TECHNIQUE: Multidetector CT imaging of the cervical spine was performed without intravenous contrast. Multiplanar CT image reconstructions were also generated. RADIATION DOSE REDUCTION: This exam was performed according to the departmental dose-optimization program which includes automated exposure control, adjustment of the mA and/or kV according to patient size and/or use of iterative reconstruction technique. COMPARISON:  MRI cervical spine 06/08/2020. Cervical spine radiographs 03/30/2006 FINDINGS: Alignment: Normal. Skull base and vertebrae: No acute fracture. No primary bone lesion or focal pathologic process. Soft tissues and spinal canal: No prevertebral fluid or swelling. No visible canal hematoma. Disc levels: Degenerative changes with disc space narrowing and mild endplate osteophyte formation throughout. Upper chest: Lung apices are clear. Other: None. IMPRESSION: Normal alignment of the cervical spine. No acute displaced fractures. Mild degenerative changes. Electronically Signed   By: Lucienne Capers M.D.   On: 04/27/2022 16:42  CT Head Wo Contrast  Result Date: 04/27/2022 CLINICAL DATA:  Posttraumatic headache. Fell today. Patient struck the back of head. EXAM: CT HEAD WITHOUT CONTRAST TECHNIQUE: Contiguous axial images were obtained from the base of the skull through the vertex without intravenous contrast. RADIATION DOSE REDUCTION: This exam was performed according to the departmental dose-optimization program which includes automated exposure control, adjustment of the mA and/or kV according to patient  size and/or use of iterative reconstruction technique. COMPARISON:  MRI brain 06/08/2020.  CT head 04/10/2006 FINDINGS: Brain: No evidence of acute infarction, hemorrhage, hydrocephalus, extra-axial collection or mass lesion/mass effect. Mild cerebral atrophy. Vascular: No hyperdense vessel or unexpected calcification. Skull: Normal. Negative for fracture or focal lesion. Sinuses/Orbits: Paranasal sinuses and mastoid air cells are clear except for a small retention cyst in the right maxillary antrum. Other: None. IMPRESSION: No acute intracranial abnormalities. Electronically Signed   By: Lucienne Capers M.D.   On: 04/27/2022 16:39   DG Chest Portable 1 View  Result Date: 04/27/2022 CLINICAL DATA:  Fall. EXAM: PORTABLE CHEST 1 VIEW COMPARISON:  Chest radiographs 02/02/2019 FINDINGS: The cardiac silhouette is mildly enlarged. The lungs are hypoinflated with bronchovascular crowding in the lung bases. No overt pulmonary edema, segmental airspace consolidation, pneumothorax, or sizable pleural effusion is identified (with note made of incomplete imaging of the left lateral costophrenic angle). No acute osseous abnormality is seen. IMPRESSION: Cardiomegaly and hypoinflation. Electronically Signed   By: Logan Bores M.D.   On: 04/27/2022 15:42   DG Pelvis Portable  Result Date: 04/27/2022 CLINICAL DATA:  Fall EXAM: PORTABLE PELVIS 1-2 VIEWS COMPARISON:  10/22/2017 FINDINGS: There is no evidence of pelvic fracture or diastasis. Hip joints intact without evidence of fracture or dislocation. No pelvic bone lesions are seen. IMPRESSION: Negative. Electronically Signed   By: Davina Poke D.O.   On: 04/27/2022 15:41      LOS: 0 days    Flora Lipps, MD [facility] Available via Epic secure chat 7am-7pm After these hours, please refer to coverage provider listed on [email or web address] 04/28/2022, 11:31 AM

## 2022-04-28 NOTE — Hospital Course (Signed)
Karla Foster is a 67 y.o. female with medical history significant for insomnia, chronic back pain, hypothyroidism, GERD, OA, hypertension, hyperlipidemia, intermittent dizziness x 1 year, presented to hospital with increasing weakness in his legs with fall.  In the ED, vitals were stable.  Labs showed potassium of 3.4 with sodium of 133.  WBC was 12.3.  Chest x-ray with some cardiomegaly.  Urinalysis was negative for infection.  Patient was very dizzy.  Workup in the ED was unremarkable including MRI of the brain.  When she was getting ready to be discharged she was unable to ambulate and was very dizzy so patient was considered for admission to hospital.    Assessment and plan.  Dizziness, post adjustment, suspect BPPV Low blood pressure noted.  Continue PT OT fall precaution.  Meclizine as needed.  MRI negative for acute finding but empty sella.  Physical debility and fall Check PT OT recommendations.   Hyperlipidemia Continue Lipitor   GERD Continue PPI   Chronic anxiety/depression Continue Cymbalta.  Klonopin on hold   Polyneuropathy Continue gabapentin.

## 2022-04-28 NOTE — Progress Notes (Signed)
   04/28/22 [id number]  Provider Notification  Provider Name/Title Dr Myna Hidalgo  Date Provider Notified 04/28/22  Time Provider Notified [phone]  Method of Notification Page  Notification Reason Other (Comment) (Pt's BP read 87/35, MAP 35)  Provider response See new orders  Date of Provider Response 04/28/22  Time of Provider Response [phone]

## 2022-04-29 DIAGNOSIS — R42 Dizziness and giddiness: Secondary | ICD-10-CM | POA: Diagnosis not present

## 2022-04-29 DIAGNOSIS — K219 Gastro-esophageal reflux disease without esophagitis: Secondary | ICD-10-CM | POA: Diagnosis not present

## 2022-04-29 DIAGNOSIS — E785 Hyperlipidemia, unspecified: Secondary | ICD-10-CM | POA: Diagnosis not present

## 2022-04-29 DIAGNOSIS — M6281 Muscle weakness (generalized): Secondary | ICD-10-CM | POA: Diagnosis not present

## 2022-04-29 DIAGNOSIS — E876 Hypokalemia: Secondary | ICD-10-CM | POA: Diagnosis not present

## 2022-04-29 DIAGNOSIS — Z4789 Encounter for other orthopedic aftercare: Secondary | ICD-10-CM | POA: Diagnosis not present

## 2022-04-29 DIAGNOSIS — H8110 Benign paroxysmal vertigo, unspecified ear: Secondary | ICD-10-CM | POA: Diagnosis not present

## 2022-04-29 LAB — CBC
HCT: 35.7 % — ABNORMAL LOW (ref 36.0–46.0)
Hemoglobin: 11.5 g/dL — ABNORMAL LOW (ref 12.0–15.0)
MCH: 33.6 pg (ref 26.0–34.0)
MCHC: 32.2 g/dL (ref 30.0–36.0)
MCV: 104.4 fL — ABNORMAL HIGH (ref 80.0–100.0)
Platelets: 225 10*3/uL (ref 150–400)
RBC: 3.42 MIL/uL — ABNORMAL LOW (ref 3.87–5.11)
RDW: 15.5 % (ref 11.5–15.5)
WBC: 12.8 10*3/uL — ABNORMAL HIGH (ref 4.0–10.5)
nRBC: 0 % (ref 0.0–0.2)

## 2022-04-29 LAB — BASIC METABOLIC PANEL
Anion gap: 9 (ref 5–15)
BUN: 8 mg/dL (ref 8–23)
CO2: 24 mmol/L (ref 22–32)
Calcium: 8.4 mg/dL — ABNORMAL LOW (ref 8.9–10.3)
Chloride: 105 mmol/L (ref 98–111)
Creatinine, Ser: 0.65 mg/dL (ref 0.44–1.00)
GFR, Estimated: >60 mL/min (ref >60)
Glucose, Bld: 97 mg/dL (ref 70–99)
Potassium: 4 mmol/L (ref 3.5–5.1)
Sodium: 138 mmol/L (ref 135–145)

## 2022-04-29 LAB — MAGNESIUM: Magnesium: 1.8 mg/dL (ref 1.7–2.4)

## 2022-04-29 MED ORDER — MECLIZINE HCL 25 MG PO TABS
25.0000 mg | ORAL_TABLET | Freq: Three times a day (TID) | ORAL | Status: DC
  Administered 2022-04-29 – 2022-04-30 (×5): 25 mg via ORAL
  Filled 2022-04-29 (×9): qty 1

## 2022-04-29 NOTE — Evaluation (Addendum)
Occupational Therapy Evaluation Patient Details Name: Karla Foster MRN: [id number] DOB: 12-13-1955 Today's Date: 04/29/2022   History of Present Illness Pt is a 67 y.o. F who presents 04/27/2022 with increasing weakness and fall. Chest x-ray with cardiomegaly and hyperinflation. CTH negative for acute abnormality. Significant PMH: chronic back pain, OA, HTN, intermittent dizziness x 1 year.   Clinical Impression   PTA pt lives with her husband, who uses a wc. At baseline, Karla Foster uses a RW for mobility and states she has had @ 15 falls in the past 3 months and has to use her husband's life alert button to call for lifting assistance. Pt demonstrates a significant functional decline, requiring mod A to prevent falls x 2 while walking to the bathroom and  overall Mod A with ADL tasks due to below listed deficits. Desats to 85 on RA, with increased SOB noted. Requires 2L to maintain above 92 during mobility/ADL. Pt states she feels much "weaker" than her baseline, as she is normally able to walk household distances and help her husband as needed. Recommend rehab at SNF. Discussed with PT and asked them to reassess mobility and DC needs. Acute OT will follow.      Recommendations for follow up therapy are one component of a multi-disciplinary discharge planning process, led by the attending physician.  Recommendations may be updated based on patient status, additional functional criteria and insurance authorization.   Follow Up Recommendations  Skilled nursing-short term rehab (<3 hours/day)     Assistance Recommended at Discharge Frequent or constant Supervision/Assistance  Patient can return home with the following A lot of help with walking and/or transfers;A lot of help with bathing/dressing/bathroom;Assistance with cooking/housework;Direct supervision/assist for medications management;Assist for transportation;Help with stairs or ramp for entrance    Functional Status Assessment   Patient has had a recent decline in their functional status and demonstrates the ability to make significant improvements in function in a reasonable and predictable amount of time.  Equipment Recommendations  BSC/3in1    Recommendations for Other Services       Precautions / Restrictions Precautions Precautions: Fall Precaution Comments: watch O2; BPPV Restrictions Weight Bearing Restrictions: No      Mobility Bed Mobility Overal bed mobility: Needs Assistance Bed Mobility: Supine to Sit     Supine to sit: Min guard, HOB elevated (heavy use of rails)          Transfers Overall transfer level: Needs assistance Equipment used: Rolling walker (2 wheels) Transfers: Sit to/from Stand, Bed to chair/wheelchair/BSC Sit to Stand: Min assist                  Balance Overall balance assessment: History of Falls, Needs assistance   Sitting balance-Leahy Scale: Fair       Standing balance-Leahy Scale: Poor                             ADL either performed or assessed with clinical judgement   ADL Overall ADL's : Needs assistance/impaired     Grooming: Supervision/safety;Set up;Sitting   Upper Body Bathing: Set up;Supervision/ safety;Sitting   Lower Body Bathing: Moderate assistance;Sit to/from stand   Upper Body Dressing : Minimal assistance;Sitting   Lower Body Dressing: Moderate assistance;Sit to/from stand   Toilet Transfer: Moderate assistance;Ambulation Toilet Transfer Details (indicate cue type and reason): prevented falls x 2 while walking to [employer]- Clothing Manipulation and Hygiene: Moderate assistance  Functional mobility during ADLs: Moderate assistance;Rolling walker (2 wheels);Cueing for safety General ADL Comments: increased SOB with activity; note desat on RA to 85     Vision Baseline Vision/History: 0 No visual deficits       Perception     Praxis      Pertinent Vitals/Pain Pain Assessment Pain  Assessment: No/denies pain     Hand Dominance Right   Extremity/Trunk Assessment Upper Extremity Assessment Upper Extremity Assessment: Generalized weakness   Lower Extremity Assessment Lower Extremity Assessment: Defer to PT evaluation   Cervical / Trunk Assessment Cervical / Trunk Assessment: Kyphotic   Communication Communication Communication: No difficulties   Cognition Arousal/Alertness: Awake/alert Behavior During Therapy: WFL for tasks assessed/performed Overall Cognitive Status: No family/caregiver present to determine baseline cognitive functioning      Scored a 4 on the Short Blessed Test (0-4)indicates "normal" cognition Decreased awareness of safety concerns                           General Comments: Notes diffiuclty with memory; states she sometimes "forgets to take her medicine"     General Comments   States her couch is "rotted out" and difficult to stand from    Exercises     Shoulder Instructions      Home Living Family/patient expects to be discharged to:: Private residence Living Arrangements: Spouse/significant other Available Help at Discharge: Family Type of Home: Apartment Home Access: Level entry     Home Layout: One level     Bathroom Shower/Tub: Teacher, early years/pre: Handicapped height Bathroom Accessibility: Yes How Accessible: Accessible via walker Home Equipment: Rolling Walker (2 wheels)          Prior Functioning/Environment Prior Level of Function : Independent/Modified Independent;History of Falls (last six months);Driving             Mobility Comments: uses RW ADLs Comments: Pt spouse does laundry due to pt hx of back surgery; spouse does cooking from wc level; Pt orders groceries form [employer] andhas them delivered; She drives to pick up medication        OT Problem List: Decreased strength;Decreased activity tolerance;Impaired balance (sitting and/or standing);Decreased  cognition;Decreased safety awareness;Decreased knowledge of use of DME or AE;Cardiopulmonary status limiting activity;Obesity      OT Treatment/Interventions: Self-care/ADL training;Therapeutic exercise;Energy conservation;DME and/or AE instruction;Therapeutic activities;Cognitive remediation/compensation;Patient/family education;Balance training    OT Goals(Current goals can be found in the care plan section) Acute Rehab OT Goals Patient Stated Goal: to get stronger OT Goal Formulation: With patient Time For Goal Achievement: 05/13/22 Potential to Achieve Goals: Good  OT Frequency: Min 2X/week    Co-evaluation              AM-PAC OT "6 Clicks" Daily Activity     Outcome Measure Help from another person eating meals?: None Help from another person taking care of personal grooming?: A Little Help from another person toileting, which includes using toliet, bedpan, or urinal?: A Lot Help from another person bathing (including washing, rinsing, drying)?: A Lot Help from another person to put on and taking off regular upper body clothing?: A Little Help from another person to put on and taking off regular lower body clothing?: A Lot 6 Click Score: 16   End of Session Equipment Utilized During Treatment: Gait belt;Rolling walker (2 wheels);Oxygen (2LO2) Nurse Communication: Mobility status;Other (comment) (DC concerns)  Activity Tolerance: Patient tolerated treatment well Patient left: in chair;with call bell/phone  within reach;with chair alarm set  OT Visit Diagnosis: Unsteadiness on feet (R26.81);Other abnormalities of gait and mobility (R26.89);Repeated falls (R29.6);Muscle weakness (generalized) (M62.81);History of falling (Z91.81);Other symptoms and signs involving cognitive function;Dizziness and giddiness (R42)                Time: [id number] OT Time Calculation (min): 35 min Charges:  OT General Charges $OT Visit: 1 Visit OT Evaluation $OT Eval Moderate Complexity: 1  Mod OT Treatments $Self Care/Home Management : 8-22 mins  Maurie Boettcher, OT/L   Acute OT Clinical Specialist [place] Pager [phone] Office [phone]   [facility] 04/29/2022, 1:07 PM

## 2022-04-29 NOTE — Progress Notes (Signed)
PROGRESS NOTE    Ashlye Mirsky  [id number] DOB: 1955/11/21 DOA: 04/27/2022 PCP: Vivi Barrack, MD    Brief Narrative:   Karla Foster is a 67 y.o. female with medical history significant for insomnia, chronic back pain, hypothyroidism, GERD, OA, hypertension, hyperlipidemia, intermittent dizziness x 1 year, presented to hospital with increasing weakness in his legs with fall.  In the ED, vitals were stable.  Labs showed potassium of 3.4 with sodium of 133.  WBC was 12.3.  Chest x-ray with some cardiomegaly.  Urinalysis was negative for infection.  Patient was very dizzy.  Workup in the ED was unremarkable including MRI of the brain.  When she was getting ready to be discharged she was unable to ambulate and was very dizzy so patient was considered for admission to hospital.    Assessment and plan.  Dizziness, suspected BPPV OT recommends skilled nursing facility.  Patient continues to have dizziness weakness and is unsafe for disposition home today.  She stated that after Epley maneuver she felt a little better yesterday but has persistent symptoms today.Marland Kitchen  MRI negative for acute finding but empty sella.  Orthostatic vitals were negative.  Will retry Epley maneuver today.  Communicated with physical therapy about it.  Will change meclizine to 25 mg 3 times daily from 12.5 mg as needed.  Physical debility and fall OT recommendations for skilled nursing facility today.  Patient has generalized weakness fatigue at this time.  Has persistent dizziness.  Repeated falls at home and is currently at a very high fall risk.   Hyperlipidemia Continue Lipitor   GERD Continue PPI   Chronic anxiety/depression Continue Cymbalta.  Klonopin on hold due to recent falls.   Polyneuropathy Continue gabapentin.  Will continue physical therapy while in the hospital.  Hypokalemia.  Potassium of 4.0.  Hypotension.  Improved after hydration.   DVT prophylaxis: Place TED hose Start:  04/28/22 1910 enoxaparin (LOVENOX) injection 40 mg Start: 04/28/22 1000   Code Status:     Code Status: Full Code  Disposition: Likely to skilled nursing facility.  Status is: Observation  The patient will require care spanning > 2 midnights and should be moved to inpatient because: Persistent dizziness, high fall risk, pending clinical improvement, generalized weakness, possible need for rehabilitation, unsafe disposition..   Family Communication: None at bedside.  Consultants:  None  Procedures:  Epley maneuver  Antimicrobials:  None  Anti-infectives (From admission, onward)    None      Subjective: Today, patient was seen and examined at bedside.  Patient still continues to have dizziness which is persistent and at high risk of falls.  Has had multiple falls at home.  Had temporary relief with Epley maneuver yesterday but has returned.  Has some fatigue weakness.  Unsafe for disposition as per OT.  Objective: Vitals:   04/29/22 0320 04/29/22 0600 04/29/22 0730 04/29/22 1310  BP: (!) 97/49 (!) 100/56  (!) 127/53  Pulse:      Resp: 16     Temp: (!) 97.1 F (36.2 C)  97.8 F (36.6 C) 98 F (36.7 C)  TempSrc: Oral  Oral   SpO2: 99%       Intake/Output Summary (Last 24 hours) at 04/29/2022 1357 Last data filed at 04/28/2022 2347 Gross per 24 hour  Intake 360 ml  Output 1350 ml  Net -990 ml    There were no vitals filed for this visit.  Physical Examination: There is no height or weight on file to  calculate BMI.   General:  Average built, not in obvious distress, Communicative, alert awake. HENT:   No scleral pallor or icterus noted. Oral mucosa with  dry lips. Chest:    Diminished breath sounds bilaterally. No crackles or wheezes.  CVS: S1 &S2 heard. No murmur.  Regular rate and rhythm. Abdomen: Soft, nontender, nondistended.  Bowel sounds are heard.   Extremities: No cyanosis, clubbing or edema.  Peripheral pulses are palpable. Psych: Alert, awake and  oriented, normal mood CNS:  No cranial nerve deficits.  Moves all extremities. Skin: Warm and dry.  No rashes noted.  Data Reviewed:   CBC: Recent Labs  Lab 04/27/22 1504 04/27/22 2325 04/28/22 0410 04/29/22 0414  WBC 12.0* 12.3* 9.8 12.8*  NEUTROABS 9.7*  --   --   --   HGB 14.1 13.3 12.4 11.5*  HCT 42.8 40.0 37.3 35.7*  MCV 102.4* 102.0* 102.2* 104.4*  PLT 285 277 255 225     Basic Metabolic Panel: Recent Labs  Lab 04/27/22 1504 04/27/22 2325 04/28/22 0410 04/29/22 0414  NA 133*  --  138 138  K 3.4*  --  3.4* 4.0  CL 97*  --  104 105  CO2 28  --  27 24  GLUCOSE 118*  --  100* 97  BUN 7*  --  6* 8  CREATININE 0.76 0.76 0.74 0.65  CALCIUM 8.9  --  8.4* 8.4*  MG  --   --  2.0 1.8  PHOS  --   --  3.7  --      Liver Function Tests: Recent Labs  Lab 04/27/22 1504  AST 46*  ALT 21  ALKPHOS 86  BILITOT 0.5  PROT 6.6  ALBUMIN 3.5      Radiology Studies: MR LUMBAR SPINE WO CONTRAST  Result Date: 04/27/2022 CLINICAL DATA:  Low back pain, prior surgery, new symptoms EXAM: MRI LUMBAR SPINE WITHOUT CONTRAST TECHNIQUE: Multiplanar, multisequence MR imaging of the lumbar spine was performed. No intravenous contrast was administered. COMPARISON:  06/08/2020 MRI lumbar spine FINDINGS: Segmentation:  5 lumbar type vertebral bodies. Alignment:  Mild dextrocurvature.  No significant listhesis. Vertebrae: Redemonstrated L1 compression fracture with interbody cage and posterior fixation visualized at T12-L3, but noted to extend superiorly into the lower thoracic spine. Susceptibility artifact from the hardware limits evaluation these levels. Otherwise normal marrow signal. No acute fracture is seen. No suspicious osseous lesion or evidence of discitis. Conus medullaris and cauda equina: Conus extends to the L2 level. Conus and cauda equina appear normal. Paraspinal and other soft tissues: Right extrarenal pelvis. No lymphadenopathy. Disc levels: T12-L1: Unchanged retropulsion at  the posterosuperior aspect of L1, which indents the thecal sac and may contact the ventral aspect of the conus. Mild spinal canal stenosis, unchanged. No neural foraminal narrowing. L1-L2: Posterior fusion. No spinal canal stenosis or neural foraminal narrowing. L2-L3: Minimal disc bulge. Posterior fusion. No spinal canal stenosis or neural foraminal narrowing. L3-L4: No significant disc bulge. No spinal canal stenosis or neural foraminal narrowing. L4-L5: Mild disc bulge. Mild facet arthropathy. No spinal canal stenosis. Narrowing of the lateral recesses. Mild-to-moderate bilateral neural foraminal narrowing, unchanged. L5-S1: Minimal disc bulge. Narrowing of the lateral recesses. No spinal canal stenosis or neural foraminal narrowing. IMPRESSION: 1. Posterior fusion from the lower thoracic spine through L3 and interbody cage at remote L1 fracture, which appear unchanged from prior exam. Chronic retropulsion at the posterosuperior aspect of L1 causes mild spinal canal stenosis at T12-L1 and may contact the conus, unchanged.  2. L4-L5 mild-to-moderate bilateral neural foraminal narrowing, unchanged. 3. Narrowing of the lateral recesses at L4-L5 and L5-S1 could affect the descending L5 and S1 nerve roots, respectively Electronically Signed   By: Merilyn Baba M.D.   On: 04/27/2022 22:20   MR BRAIN WO CONTRAST  Result Date: 04/27/2022 CLINICAL DATA:  Fall, headache, stroke suspected EXAM: MRI HEAD WITHOUT CONTRAST TECHNIQUE: Multiplanar, multiecho pulse sequences of the brain and surrounding structures were obtained without intravenous contrast. COMPARISON:  06/08/2020, correlation is also made with same day CT head FINDINGS: Brain: No restricted diffusion to suggest acute or subacute infarct. No acute hemorrhage, mass, mass effect, or midline shift. No hydrocephalus or extra-axial collection. Empty sella. Normal craniocervical junction. Mildly advanced cerebral atrophy for age. Scattered T2 hyperintense signal in  the periventricular white matter, likely the sequela of minimal chronic small vessel ischemic disease. No hemosiderin deposition to suggest remote hemorrhage. Vascular: Normal arterial flow voids. Skull and upper cervical spine: Normal marrow signal. Sinuses/Orbits: Clear paranasal sinuses. Vertically tortuous optic nerves. Status post bilateral lens replacements. Other: The mastoid air cells are well aerated. IMPRESSION: 1. No acute intracranial process. No evidence of acute or subacute infarct. 2. Empty sella and tortuous optic nerves, which are nonspecific but can be seen in the setting of idiopathic intracranial hypertension. Electronically Signed   By: Merilyn Baba M.D.   On: 04/27/2022 21:57   CT Lumbar Spine Wo Contrast  Result Date: 04/27/2022 CLINICAL DATA:  Lumbar radiculopathy, trauma. Both legs gave out today resulting in a fall. EXAM: CT LUMBAR SPINE WITHOUT CONTRAST TECHNIQUE: Multidetector CT imaging of the lumbar spine was performed without intravenous contrast administration. Multiplanar CT image reconstructions were also generated. RADIATION DOSE REDUCTION: This exam was performed according to the departmental dose-optimization program which includes automated exposure control, adjustment of the mA and/or kV according to patient size and/or use of iterative reconstruction technique. COMPARISON:  MRI lumbar spine 06/08/2020. CT lumbar spine 07/10/2016. Lumbar spine radiographs 06/28/2016 FINDINGS: Segmentation: 5 lumbar type vertebral bodies. Alignment: Mild retrolisthesis of L1, unchanged since prior studies. Otherwise normal alignment. Vertebrae: There is an old compression fracture of the L1 vertebra with loss of height unchanged since prior study. Since the prior CT and plain films, there has been interval postoperative change with placement of hardware spacer at L1. Posterior rod and screw fixation beginning above the T11 level and extending to the L3 level. Surgical hardware as visualized  appears intact without evidence of significant loosening. Bony fusion of the posterior elements. No new acute or displaced fractures are identified. Paraspinal and other soft tissues: No abnormal paraspinal soft tissue mass or infiltration. Disc levels: Degenerative changes with mild disc space narrowing and osteophyte formation most prominent at L4-5, where there is mild degenerative disc disease. IMPRESSION: 1. No acute displaced fractures are identified. 2. Old compression deformity of L1. 3. Postoperative changes with hardware spacer at L1, posterior rod and screw fixation from the thoracic spine to L3, and posterior bony fusion. Electronically Signed   By: Lucienne Capers M.D.   On: 04/27/2022 16:47   CT Cervical Spine Wo Contrast  Result Date: 04/27/2022 CLINICAL DATA:  Neck trauma.  Patient fell today. EXAM: CT CERVICAL SPINE WITHOUT CONTRAST TECHNIQUE: Multidetector CT imaging of the cervical spine was performed without intravenous contrast. Multiplanar CT image reconstructions were also generated. RADIATION DOSE REDUCTION: This exam was performed according to the departmental dose-optimization program which includes automated exposure control, adjustment of the mA and/or kV according to patient size and/or  use of iterative reconstruction technique. COMPARISON:  MRI cervical spine 06/08/2020. Cervical spine radiographs 03/30/2006 FINDINGS: Alignment: Normal. Skull base and vertebrae: No acute fracture. No primary bone lesion or focal pathologic process. Soft tissues and spinal canal: No prevertebral fluid or swelling. No visible canal hematoma. Disc levels: Degenerative changes with disc space narrowing and mild endplate osteophyte formation throughout. Upper chest: Lung apices are clear. Other: None. IMPRESSION: Normal alignment of the cervical spine. No acute displaced fractures. Mild degenerative changes. Electronically Signed   By: Lucienne Capers M.D.   On: 04/27/2022 16:42   CT Head Wo  Contrast  Result Date: 04/27/2022 CLINICAL DATA:  Posttraumatic headache. Fell today. Patient struck the back of head. EXAM: CT HEAD WITHOUT CONTRAST TECHNIQUE: Contiguous axial images were obtained from the base of the skull through the vertex without intravenous contrast. RADIATION DOSE REDUCTION: This exam was performed according to the departmental dose-optimization program which includes automated exposure control, adjustment of the mA and/or kV according to patient size and/or use of iterative reconstruction technique. COMPARISON:  MRI brain 06/08/2020.  CT head 04/10/2006 FINDINGS: Brain: No evidence of acute infarction, hemorrhage, hydrocephalus, extra-axial collection or mass lesion/mass effect. Mild cerebral atrophy. Vascular: No hyperdense vessel or unexpected calcification. Skull: Normal. Negative for fracture or focal lesion. Sinuses/Orbits: Paranasal sinuses and mastoid air cells are clear except for a small retention cyst in the right maxillary antrum. Other: None. IMPRESSION: No acute intracranial abnormalities. Electronically Signed   By: Lucienne Capers M.D.   On: 04/27/2022 16:39   DG Chest Portable 1 View  Result Date: 04/27/2022 CLINICAL DATA:  Fall. EXAM: PORTABLE CHEST 1 VIEW COMPARISON:  Chest radiographs 02/02/2019 FINDINGS: The cardiac silhouette is mildly enlarged. The lungs are hypoinflated with bronchovascular crowding in the lung bases. No overt pulmonary edema, segmental airspace consolidation, pneumothorax, or sizable pleural effusion is identified (with note made of incomplete imaging of the left lateral costophrenic angle). No acute osseous abnormality is seen. IMPRESSION: Cardiomegaly and hypoinflation. Electronically Signed   By: Logan Bores M.D.   On: 04/27/2022 15:42   DG Pelvis Portable  Result Date: 04/27/2022 CLINICAL DATA:  Fall EXAM: PORTABLE PELVIS 1-2 VIEWS COMPARISON:  10/22/2017 FINDINGS: There is no evidence of pelvic fracture or diastasis. Hip joints intact  without evidence of fracture or dislocation. No pelvic bone lesions are seen. IMPRESSION: Negative. Electronically Signed   By: Davina Poke D.O.   On: 04/27/2022 15:41      LOS: 0 days    Flora Lipps, MD [facility] Available via Epic secure chat 7am-7pm After these hours, please refer to coverage provider listed on [email or web address] 04/29/2022, 1:57 PM

## 2022-04-29 NOTE — TOC Initial Note (Addendum)
Transition of Care [facility]) - Initial/Assessment Note    Patient Details  Name: Karla Foster MRN: [id number] Date of Birth: 13-Jul-1955  Transition of Care [facility]) CM/SW Contact:    Carles Collet, RN Phone Number: 04/29/2022, 9:49 AM  Clinical Narrative:                  Damaris Schooner w patient over the phone.  She states that she will need a RW, ordered, requested [employer] to bring to the room today. Discussed HH. She is agreeable to services and for CM to find agency to assign. Referral sent to [facility] accepted Expected Discharge Plan: [place] Barriers to Discharge: Continued Medical Work up   Patient Goals and CMS Choice Patient states their goals for this hospitalization and ongoing recovery are:: to go home CMS Medicare.gov Compare Post Acute Care list provided to:: Patient Choice offered to / list presented to : Patient      Expected Discharge Plan and Services   Discharge Planning Services: CM Consult Post Acute Care Choice: Durable Medical Equipment, Home Health Living arrangements for the past 2 months: Apartment                 DME Arranged: Gilford Rile DME Agency: [employer] Date DME Agency Contacted: 04/29/22 Time DME Agency Contacted: [phone] Representative spoke with at DME Agency: [place]: PT, OT [place] Agency: [place] Date [place]: 04/29/22 Time [place]: [phone] Representative spoke with at [place]: [place] Arrangements/Services Living arrangements for the past 2 months: Apartment                     Activities of Daily Living Home Assistive Devices/Equipment: Environmental consultant (specify type) ADL Screening (condition at time of admission) Patient's cognitive ability adequate to safely complete daily activities?: Yes Is the patient deaf or have difficulty hearing?: No Does the patient have difficulty seeing, even when wearing glasses/contacts?: No Does the patient have difficulty  concentrating, remembering, or making decisions?: Yes Patient able to express need for assistance with ADLs?: Yes Does the patient have difficulty dressing or bathing?: Yes Independently performs ADLs?: Yes (appropriate for developmental age) Does the patient have difficulty walking or climbing stairs?: Yes Weakness of Legs: Both Weakness of Arms/Hands: Both  Permission Sought/Granted                  Emotional Assessment              Admission diagnosis:  Dizziness [R42] Vertigo [R42] Concussion without loss of consciousness, initial encounter [S06.0X0A] Ambulatory dysfunction [R26.2] Patient Active Problem List   Diagnosis Date Noted   BPPV (benign paroxysmal positional vertigo) 04/28/2022   Dizziness 04/27/2022   Overweight 03/17/2021   Constipation 07/01/2018   Hypokalemia 10/22/2017   GERD (gastroesophageal reflux disease)    COPD (chronic obstructive pulmonary disease) ([place]) 07/26/2016   Chronic back pain with Lumbar compression fracture and radiculopathy ([place]) 07/10/2016   Major depressive disorder, single episode, in remission ([place])    Family history of coronary artery disease, F-CABG 34's, brother died of MI 45 26-Jun-2011   Dyslipidemia, she does not take meds 2011-06-26   Anxiety 26-Jun-2011   Nicotine dependence with current use Jun 26, 2011   PCP:  Vivi Barrack, MD Pharmacy:   [id number] [place] [id number]: [phone]: [phone]    Social Determinants of Health (SDOH) Social History:  SDOH Screenings   Food Insecurity: Food Insecurity Present (04/28/2022)  Housing: Low Risk  (04/28/2022)  Transportation Needs: No Transportation Needs (04/28/2022)  Utilities: Not At Risk (04/28/2022)  Depression (PHQ2-9): Low Risk  (05/12/2021)  Financial Resource Strain: Medium Risk (05/12/2021)  Physical Activity: Inactive (05/12/2021)  Social Connections: Moderately Integrated  (05/12/2021)  Stress: Stress Concern Present (05/12/2021)  Tobacco Use: High Risk (04/27/2022)   SDOH Interventions:     Readmission Risk Interventions     No data to display

## 2022-04-29 NOTE — Care Management Obs Status (Signed)
[place] NOTIFICATION   Patient Details  Name: Karla Foster MRN: [id number] Date of Birth: [birth year]   Medicare Observation Status Notification Given:  Yes    Carles Collet, RN 04/29/2022, 9:45 AM

## 2022-04-29 NOTE — Plan of Care (Signed)
Patient continue to share that she still feels dizzy with movement. No other distress or concerns voice.   Problem: Education: Goal: Knowledge of General Education information will improve Description: Including pain rating scale, medication(s)/side effects and non-pharmacologic comfort measures Outcome: Progressing

## 2022-04-29 NOTE — Progress Notes (Signed)
   04/29/22 2033  Spiritual Encounters  Type of Visit Initial  Referral source Patient request  Reason for visit Routine spiritual support  OnCall Visit No   Visited patient for prayer

## 2022-04-30 DIAGNOSIS — K219 Gastro-esophageal reflux disease without esophagitis: Secondary | ICD-10-CM | POA: Diagnosis not present

## 2022-04-30 DIAGNOSIS — H8110 Benign paroxysmal vertigo, unspecified ear: Secondary | ICD-10-CM | POA: Diagnosis not present

## 2022-04-30 DIAGNOSIS — R262 Difficulty in walking, not elsewhere classified: Secondary | ICD-10-CM

## 2022-04-30 DIAGNOSIS — E785 Hyperlipidemia, unspecified: Secondary | ICD-10-CM | POA: Diagnosis not present

## 2022-04-30 DIAGNOSIS — E876 Hypokalemia: Secondary | ICD-10-CM | POA: Diagnosis not present

## 2022-04-30 MED ORDER — DOCUSATE SODIUM 100 MG PO CAPS
100.0000 mg | ORAL_CAPSULE | Freq: Two times a day (BID) | ORAL | Status: DC
  Administered 2022-04-30 – 2022-05-01 (×3): 100 mg via ORAL
  Filled 2022-04-30 (×3): qty 1

## 2022-04-30 NOTE — Plan of Care (Signed)
  Problem: Clinical Measurements: Goal: Will remain free from infection Outcome: Progressing   Problem: Activity: Goal: Risk for activity intolerance will decrease Outcome: Progressing   Problem: Coping: Goal: Level of anxiety will decrease Outcome: Progressing   Problem: Safety: Goal: Ability to remain free from injury will improve Outcome: Progressing   Problem: Skin Integrity: Goal: Risk for impaired skin integrity will decrease Outcome: Progressing   

## 2022-04-30 NOTE — TOC Progression Note (Addendum)
Transition of Care [facility]) - Progression Note    Patient Details  Name: Karla Foster MRN: [id number] Date of Birth: 04/13/1955  Transition of Care [facility]) CM/SW [place],  Phone Number: 04/30/2022, 3:03 PM  Clinical Narrative:   CSW met with patient to discuss recommendation for SNF. Patient in agreement, preference for [facility]. [facility] able to accept when insurance has approved. CSW requested initiation of insurance authorization. CSW to follow.  UPDATE 3:58 PM: CSW received insurance authorization, plan to admit to SNF tomorrow if medically stable. CSW to follow.  Expected Discharge Plan: Skilled Nursing Facility Barriers to Discharge: Continued Medical Work up, Ship broker  Expected Discharge Plan and Services   Discharge Planning Services: CM Consult Post Acute Care Choice: Durable Medical Equipment, Home Health Living arrangements for the past 2 months: Apartment                 DME Arranged: Gilford Rile DME Agency: [employer] Date DME Agency Contacted: 04/29/22 Time DME Agency Contacted: [phone] Representative spoke with at DME Agency: [place]: PT, OT [place] Agency: [place] Date [place]: 04/29/22 Time [place]: [phone] Representative spoke with at [place]: [place] ([place]) Interventions SDOH Screenings   Food Insecurity: Food Insecurity Present (04/28/2022)  Housing: Low Risk  (04/28/2022)  Transportation Needs: No Transportation Needs (04/28/2022)  Utilities: Not At Risk (04/28/2022)  Depression (PHQ2-9): Low Risk  (05/12/2021)  Financial Resource Strain: Medium Risk (05/12/2021)  Physical Activity: Inactive (05/12/2021)  Social Connections: Moderately Integrated (05/12/2021)  Stress: Stress Concern Present (05/12/2021)  Tobacco Use: High Risk (04/27/2022)    Readmission Risk Interventions     No data to display

## 2022-04-30 NOTE — Progress Notes (Signed)
Said she does not want to get up for fear of falling, I did discuss with her that mobility is very important, as not moving can cause many other issues including pneumonia, DVTs, and pressure ulcers. She said people keep saying that but she does not have any of those problems.  I did discuss that if she does not start to move more, she could have those issues.  Said she will try to get up and move more.  I reminded her that she does need to be with staff when up though. She said ok.

## 2022-04-30 NOTE — Progress Notes (Signed)
Physical Therapy Treatment Patient Details Name: Karla Foster MRN: [id number] DOB: 02/12/1956 Today's Date: 04/30/2022   History of Present Illness Pt is a 67 y.o. F who presents 04/27/2022 with increasing weakness and fall. Chest x-ray with cardiomegaly and hyperinflation. CTH negative for acute abnormality. Significant PMH: chronic back pain, OA, HTN, intermittent dizziness x 1 year.    PT Comments    Patient quickly fatigues with sitting EOB for vestibular testing with posterior LOB needing min A to recover during saccades testing.  Patient still with L posterior canal BPPV treated again with Eply x 1.  She was too fatigued to ambulate.  She is agreeable to SNF level rehab as spouse unable to assist.  PT will continue to follow.    Recommendations for follow up therapy are one component of a multi-disciplinary discharge planning process, led by the attending physician.  Recommendations may be updated based on patient status, additional functional criteria and insurance authorization.  Follow Up Recommendations  Skilled nursing-short term rehab (<3 hours/day) Can patient physically be transported by private vehicle: Yes   Assistance Recommended at Discharge Intermittent Supervision/Assistance  Patient can return home with the following A little help with walking and/or transfers;A little help with bathing/dressing/bathroom;Assistance with cooking/housework;Assist for transportation;Help with stairs or ramp for entrance   Equipment Recommendations  None recommended by PT    Recommendations for Other Services       Precautions / Restrictions       Mobility  Bed Mobility   Bed Mobility: Supine to Sit     Supine to sit: HOB elevated, Supervision, Min assist     General bed mobility comments: S initially with rail and HOB up, then assist for up post Eply    Transfers Overall transfer level: Needs assistance   Transfers: Sit to/from Stand Sit to Stand: Min assist            General transfer comment: A to stand and move up toward [facility] prior to Eply    Ambulation/Gait               General Gait Details: defferred due to fatigue after vestibular testing   Stairs             Wheelchair Mobility    Modified Rankin (Stroke Patients Only)       Balance Overall balance assessment: Needs assistance   Sitting balance-Leahy Scale: Fair Sitting balance - Comments: static sitting with S, at times min A with dynamic activities during vestibular testing                                    Cognition Arousal/Alertness: Awake/alert Behavior During Therapy: WFL for tasks assessed/performed Overall Cognitive Status: No family/caregiver present to determine baseline cognitive functioning                                 General Comments: Notes diffiuclty with memory        Exercises      General Comments General comments (skin integrity, edema, etc.): completed Eply x 1 for L posterior canal BPPV      Pertinent Vitals/Pain Pain Assessment Pain Assessment: No/denies pain    Home Living                          Prior Function  PT Goals (current goals can now be found in the care plan section) Progress towards PT goals: Progressing toward goals    Frequency    Min 3X/week      PT Plan Discharge plan needs to be updated    Co-evaluation              AM-PAC PT "6 Clicks" Mobility   Outcome Measure  Help needed turning from your back to your side while in a flat bed without using bedrails?: None Help needed moving from lying on your back to sitting on the side of a flat bed without using bedrails?: A Little Help needed moving to and from a bed to a chair (including a wheelchair)?: A Little Help needed standing up from a chair using your arms (e.g., wheelchair or bedside chair)?: A Little Help needed to walk in hospital room?: Total Help needed climbing 3-5 steps  with a railing? : Total 6 Click Score: 15    End of Session Equipment Utilized During Treatment: Oxygen Activity Tolerance: Patient limited by fatigue Patient left: in bed   PT Visit Diagnosis: Unsteadiness on feet (R26.81);History of falling (Z91.81);BPPV BPPV - Right/Left : Left     Time: [id number] PT Time Calculation (min) (ACUTE ONLY): 27 min  Charges:  $Neuromuscular Re-education: 8-22 mins $Canalith Rep Proc: 8-22 mins                     Magda Kiel, PT Acute Rehabilitation Services Office:[phone] 04/30/2022    Reginia Naas 04/30/2022, 12:40 PM

## 2022-04-30 NOTE — Progress Notes (Signed)
Occupational Therapy Treatment Patient Details Name: Karla Foster MRN: [id number] DOB: [birth year] Today's Date: 04/30/2022   History of present illness Pt is a 67 y.o. F who presents 04/27/2022 with increasing weakness and fall. Chest x-ray with cardiomegaly and hyperinflation. CTH negative for acute abnormality. Significant PMH: chronic back pain, OA, HTN, intermittent dizziness x 1 year.   OT comments  Patient supine in bed and requires encouragement to participate.  Bed mobility with min assist, EOB ADLs with min assist for LB dressing and simulated bathing. Discussed safety and fall prevention techniques with dizziness, pt voiced understanding.  Declined further mobility or Adls at this time, requested to lay back down.  Reinforced importance of mobilization, encouraged OOB 3x/day for meals and RN notified as well.  Will follow.   Recommendations for follow up therapy are one component of a multi-disciplinary discharge planning process, led by the attending physician.  Recommendations may be updated based on patient status, additional functional criteria and insurance authorization.    Follow Up Recommendations  Skilled nursing-short term rehab (<3 hours/day)     Assistance Recommended at Discharge Frequent or constant Supervision/Assistance  Patient can return home with the following  A lot of help with walking and/or transfers;A lot of help with bathing/dressing/bathroom;Assistance with cooking/housework;Direct supervision/assist for medications management;Assist for transportation;Help with stairs or ramp for entrance   Equipment Recommendations  BSC/3in1    Recommendations for Other Services      Precautions / Restrictions Precautions Precautions: Fall Precaution Comments: watch O2; BPPV Restrictions Weight Bearing Restrictions: No       Mobility Bed Mobility Overal bed mobility: Needs Assistance Bed Mobility: Supine to Sit, Sit to Supine     Supine to sit:  Min assist, HOB elevated Sit to supine: Min guard   General bed mobility comments: min assist to power trunk up fully    Transfers                         Balance Overall balance assessment: Needs assistance   Sitting balance-Leahy Scale: Fair Sitting balance - Comments: min guard dynamically during ADLS                                   ADL either performed or assessed with clinical judgement   ADL Overall ADL's : Needs assistance/impaired     Grooming: Supervision/safety;Set up;Sitting       Lower Body Bathing: Minimal assistance;Sitting/lateral leans Lower Body Bathing Details (indicate cue type and reason): simulated with donning lotion on LEs     Lower Body Dressing: Minimal assistance;Sitting/lateral leans Lower Body Dressing Details (indicate cue type and reason): donning socks   Toilet Transfer Details (indicate cue type and reason): pt refused           General ADL Comments: pt on 2L during session    Extremity/Trunk Assessment              Vision       Perception     Praxis      Cognition Arousal/Alertness: Awake/alert Behavior During Therapy: WFL for tasks assessed/performed Overall Cognitive Status: No family/caregiver present to determine baseline cognitive functioning                                 General Comments: pt self limiting at times, reports "I'm getting the  rest I've needed".  She demonstrates some decreased STM and problem solving.  Would benefit from pill box test.        Exercises      Shoulder Instructions       General Comments completed Eply x 1 for L posterior canal BPPV    Pertinent Vitals/ Pain       Pain Assessment Pain Assessment: No/denies pain  Home Living                                          Prior Functioning/Environment              Frequency  Min 2X/week        Progress Toward Goals  OT Goals(current goals can now be found  in the care plan section)  Progress towards OT goals: Progressing toward goals  Acute Rehab OT Goals Patient Stated Goal: get better OT Goal Formulation: With patient Time For Goal Achievement: 05/13/22 Potential to Achieve Goals: Good  Plan Discharge plan remains appropriate;Frequency remains appropriate    Co-evaluation                 AM-PAC OT "6 Clicks" Daily Activity     Outcome Measure   Help from another person eating meals?: None Help from another person taking care of personal grooming?: A Little Help from another person toileting, which includes using toliet, bedpan, or urinal?: A Lot Help from another person bathing (including washing, rinsing, drying)?: A Little Help from another person to put on and taking off regular upper body clothing?: A Little Help from another person to put on and taking off regular lower body clothing?: A Little 6 Click Score: 18    End of Session Equipment Utilized During Treatment: Oxygen (2L)  OT Visit Diagnosis: Unsteadiness on feet (R26.81);Other abnormalities of gait and mobility (R26.89);Repeated falls (R29.6);Muscle weakness (generalized) (M62.81);History of falling (Z91.81);Other symptoms and signs involving cognitive function;Dizziness and giddiness (R42)   Activity Tolerance Patient limited by fatigue   Patient Left in bed;with call bell/phone within reach;with bed alarm set   Nurse Communication Mobility status        Time: [id number] OT Time Calculation (min): 19 min  Charges: OT General Charges $OT Visit: 1 Visit OT Treatments $Self Care/Home Management : 8-22 mins  Jolaine Artist, OT Acute Rehabilitation Services Office [phone]   [name] 04/30/2022, 1:50 PM
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
## 2022-04-30 NOTE — NC FL2 (Signed)
[place] LEVEL OF CARE FORM     IDENTIFICATION  Patient Name: Karla Foster Birthdate: 1956-02-01 Sex: female Admission Date (Current Location): 04/27/2022  [facility] and [place] Number:  Herbalist and Address:  The . [facility], [place] [address] [place], [place] [address]      Provider Number: [id number]  Attending Physician Name and Address:  Flora Lipps, MD  Relative Name and Phone Number:       Current Level of Care: Hospital Recommended Level of Care: [place] Prior Approval Number:    Date Approved/Denied:   PASRR Number: [id number]  Discharge Plan: SNF    Current Diagnoses: Patient Active Problem List   Diagnosis Date Noted   BPPV (benign paroxysmal positional vertigo) 04/28/2022   Dizziness 04/27/2022   Overweight 03/17/2021   Constipation 07/01/2018   Hypokalemia 10/22/2017   GERD (gastroesophageal reflux disease)    COPD (chronic obstructive pulmonary disease) ([place]) 07/26/2016   Chronic back pain with Lumbar compression fracture and radiculopathy ([place]) 07/10/2016   Major depressive disorder, single episode, in remission ([place])    Family history of coronary artery disease, F-CABG 12's, brother died of MI 96 06-28-2011   Dyslipidemia, she does not take meds 2011/06/28   Anxiety Jun 28, 2011   Nicotine dependence with current use 06/28/11    Orientation RESPIRATION BLADDER Height & Weight     Self, Time, Situation, Place  O2 ([place] 2L) Incontinent Weight:   Height:     BEHAVIORAL SYMPTOMS/MOOD NEUROLOGICAL BOWEL NUTRITION STATUS      Continent Diet (heart healthy)  AMBULATORY STATUS COMMUNICATION OF NEEDS Skin   Limited Assist Verbally Normal                       Personal Care Assistance Level of Assistance  Bathing, Feeding, Dressing Bathing Assistance: Limited assistance Feeding assistance: Limited assistance Dressing Assistance: Limited assistance     Functional  Limitations Info             [place]  PT (By licensed PT), OT (By licensed OT)     PT Frequency: 5x/wk OT Frequency: 5x/wk            Contractures Contractures Info: Not present    Additional Factors Info  Code Status, Allergies, Psychotropic Code Status Info: Full Allergies Info: Amoxicillin Psychotropic Info: Cymbalta '60mg'$  daily         Current Medications (04/30/2022):  This is the current hospital active medication list Current Facility-Administered Medications  Medication Dose Route Frequency Provider Last Rate Last Admin   acetaminophen (TYLENOL) tablet 650 mg  650 mg Oral Q6H PRN Irene Pap N, DO   650 mg at 04/30/22 0831   albuterol (PROVENTIL) (2.5 MG/3ML) 0.083% nebulizer solution 2.5 mg  2.5 mg Nebulization Q4H PRN Pokhrel, Laxman, MD       atorvastatin (LIPITOR) tablet 40 mg  40 mg Oral Daily Irene Pap N, DO   40 mg at 04/30/22 1007   DULoxetine (CYMBALTA) DR capsule 60 mg  60 mg Oral Daily Irene Pap N, DO   60 mg at 04/30/22 1006   enoxaparin (LOVENOX) injection 40 mg  40 mg Subcutaneous Q24H Hall, Carole N, DO   40 mg at 04/30/22 1007   gabapentin (NEURONTIN) capsule 200 mg  200 mg Oral TID Irene Pap N, DO   200 mg at 04/30/22 1007   meclizine (ANTIVERT) tablet 25 mg  25 mg Oral TID Pokhrel, Laxman,  MD   25 mg at 04/30/22 1308   polyethylene glycol (MIRALAX / GLYCOLAX) packet 17 g  17 g Oral Daily PRN Kayleen Memos, DO   17 g at 04/30/22 1221   prochlorperazine (COMPAZINE) injection 5 mg  5 mg Intravenous Q6H PRN Kayleen Memos, DO         Discharge Medications: Please see discharge summary for a list of discharge medications.  Relevant Imaging Results:  Relevant Lab Results:   Additional Information SS#: [id number]  Geralynn Ochs, LCSW

## 2022-04-30 NOTE — Progress Notes (Signed)
PROGRESS NOTE    Karla Foster  [id number] DOB: 1956-01-28 DOA: 04/27/2022 PCP: Vivi Barrack, MD    Brief Narrative:   Karla Foster is a 67 y.o. female with medical history significant for insomnia, chronic back pain, hypothyroidism, GERD, OA, hypertension, hyperlipidemia, intermittent dizziness x 1 year, presented to the hospital with increasing weakness in his legs with fall.  In the ED, vitals were stable.  Labs showed potassium of 3.4 with sodium of 133.  WBC was 12.3.  Chest x-ray with some cardiomegaly.  Urinalysis was negative for infection.  Patient was very dizzy.  Workup in the ED was unremarkable including MRI of the brain.  When she was getting ready to be discharged she was unable to ambulate and was very dizzy so patient was considered for admission to hospital.    Assessment and plan.  Dizziness, suspected BPPV OT recommends skilled nursing facility.  Patient continues to have dizziness weakness and is unsafe for disposition home today.  She stated that after Epley maneuver she felt a little better yesterday but has persistent symptoms today.  When Epley maneuver again 05/07/2022 but still continues to have dizziness.Mar[place] Kitchen  MRI negative for acute finding but empty sella.  Orthostatic vitals were negative.   Currently on meclizine 25 mg 3 times daily.  Communicated with ENT on-call Dr. Janace Hoard who stated that he did not have further recommendations.  Patient however stated that she had been having some headache as well.  Concern for possible vestibular migraine, vertebrobasilar insufficiency.  Will communicated with neurology as well.  Physical debility and fall Physical therapy, OT recommendations for skilled nursing facility today.  Patient has generalized weakness, fatigue at this time.  Has persistent dizziness despite maneuvers.  Repeated falls at home and is currently at a very high fall risk.   Hyperlipidemia Continue Lipitor   GERD Continue PPI    Chronic anxiety/depression Continue Cymbalta.  Klonopin on hold due to recent falls.   Polyneuropathy Continue gabapentin.  Will continue physical therapy while in the hospital.  Hypokalemia.  Potassium of 4.0.  Check BMP in AM.  Hypotension.  Still slightly hypotensive.  Continue to monitor.  Patient did not have orthostatic hypotension.   DVT prophylaxis: Place TED hose Start: 04/28/22 1910 enoxaparin (LOVENOX) injection 40 mg Start: 04/28/22 1000   Code Status:     Code Status: Full Code  Disposition: Plan for skilled nursing facility placement.  Status is: Observation  The patient will require care spanning > 2 midnights and should be moved to inpatient because: Persistent dizziness, high fall risk, pending clinical improvement, generalized weakness, need for rehabilitation, unsafe disposition..   Family Communication: None at bedside.  Consultants:  Verbal consult with ENT.  neurology  Procedures:  Epley maneuver  Antimicrobials:  None  Anti-infectives (From admission, onward)    None      Subjective: Today, patient was seen and examined at bedside.  Continues to have dizziness, vertigo.  Stated that she did have temporary improvement but not lasting and has been persistent again.  Also complained of lower back pain.  Has constipation.    Objective: Vitals:   04/30/22 0049 04/30/22 0333 04/30/22 0825 04/30/22 1114  BP: (!) 92/51 (!) 109/56 (!) 105/50 (!) 98/58  Pulse:  91 87 81  Resp:  '18 18 16  '$ Temp:  98.8 F (37.1 C) 98.5 F (36.9 C) 98.4 F (36.9 C)  TempSrc:  Oral Oral Oral  SpO2:  96% 96% 95%    Intake/Output  Summary (Last 24 hours) at 04/30/2022 1425 Last data filed at 04/30/2022 0739 Gross per 24 hour  Intake --  Output 1650 ml  Net -1650 ml    There were no vitals filed for this visit.  Physical Examination: There is no height or weight on file to calculate BMI.   General: Alert awake and Communicative, not in obvious  distress. HENT:   No scleral pallor or icterus noted. Oral mucosa with  dry lips. Chest:    Diminished breath sounds bilaterally. No crackles or wheezes.  CVS: S1 &S2 heard. No murmur.  Regular rate and rhythm. Abdomen: Soft, nontender, nondistended.  Bowel sounds are heard.   Extremities: No cyanosis, clubbing or edema.  Peripheral pulses are palpable. Psych: Alert, awake and oriented, normal mood CNS:  No cranial nerve deficits.  Moves all extremities. Skin: Warm and dry.  No rashes noted.  Data Reviewed:   CBC: Recent Labs  Lab 04/27/22 1504 04/27/22 2325 04/28/22 0410 04/29/22 0414  WBC 12.0* 12.3* 9.8 12.8*  NEUTROABS 9.7*  --   --   --   HGB 14.1 13.3 12.4 11.5*  HCT 42.8 40.0 37.3 35.7*  MCV 102.4* 102.0* 102.2* 104.4*  PLT 285 277 255 225     Basic Metabolic Panel: Recent Labs  Lab 04/27/22 1504 04/27/22 2325 04/28/22 0410 04/29/22 0414  NA 133*  --  138 138  K 3.4*  --  3.4* 4.0  CL 97*  --  104 105  CO2 28  --  27 24  GLUCOSE 118*  --  100* 97  BUN 7*  --  6* 8  CREATININE 0.76 0.76 0.74 0.65  CALCIUM 8.9  --  8.4* 8.4*  MG  --   --  2.0 1.8  PHOS  --   --  3.7  --      Liver Function Tests: Recent Labs  Lab 04/27/22 1504  AST 46*  ALT 21  ALKPHOS 86  BILITOT 0.5  PROT 6.6  ALBUMIN 3.5      Radiology Studies: No results found.    LOS: 0 days    Flora Lipps, MD [facility] Available via Epic secure chat 7am-7pm After these hours, please refer to coverage provider listed on [email or web address] 04/30/2022, 2:25 PM

## 2022-05-01 ENCOUNTER — Observation Stay (HOSPITAL_COMMUNITY): Payer: 59

## 2022-05-01 DIAGNOSIS — R296 Repeated falls: Secondary | ICD-10-CM | POA: Diagnosis not present

## 2022-05-01 DIAGNOSIS — R42 Dizziness and giddiness: Secondary | ICD-10-CM | POA: Diagnosis not present

## 2022-05-01 DIAGNOSIS — Z741 Need for assistance with personal care: Secondary | ICD-10-CM | POA: Diagnosis not present

## 2022-05-01 DIAGNOSIS — Z7985 Long-term (current) use of injectable non-insulin antidiabetic drugs: Secondary | ICD-10-CM | POA: Diagnosis not present

## 2022-05-01 DIAGNOSIS — I10 Essential (primary) hypertension: Secondary | ICD-10-CM | POA: Diagnosis not present

## 2022-05-01 DIAGNOSIS — W1830XA Fall on same level, unspecified, initial encounter: Secondary | ICD-10-CM | POA: Diagnosis present

## 2022-05-01 DIAGNOSIS — M6281 Muscle weakness (generalized): Secondary | ICD-10-CM | POA: Diagnosis not present

## 2022-05-01 DIAGNOSIS — G629 Polyneuropathy, unspecified: Secondary | ICD-10-CM | POA: Diagnosis present

## 2022-05-01 DIAGNOSIS — R404 Transient alteration of awareness: Secondary | ICD-10-CM | POA: Diagnosis not present

## 2022-05-01 DIAGNOSIS — R29898 Other symptoms and signs involving the musculoskeletal system: Secondary | ICD-10-CM | POA: Diagnosis not present

## 2022-05-01 DIAGNOSIS — R519 Headache, unspecified: Secondary | ICD-10-CM | POA: Diagnosis present

## 2022-05-01 DIAGNOSIS — E785 Hyperlipidemia, unspecified: Secondary | ICD-10-CM | POA: Diagnosis not present

## 2022-05-01 DIAGNOSIS — H8110 Benign paroxysmal vertigo, unspecified ear: Secondary | ICD-10-CM | POA: Diagnosis not present

## 2022-05-01 DIAGNOSIS — Z8611 Personal history of tuberculosis: Secondary | ICD-10-CM | POA: Diagnosis not present

## 2022-05-01 DIAGNOSIS — M6250 Muscle wasting and atrophy, not elsewhere classified, unspecified site: Secondary | ICD-10-CM | POA: Diagnosis not present

## 2022-05-01 DIAGNOSIS — G44201 Tension-type headache, unspecified, intractable: Secondary | ICD-10-CM | POA: Diagnosis not present

## 2022-05-01 DIAGNOSIS — K219 Gastro-esophageal reflux disease without esophagitis: Secondary | ICD-10-CM | POA: Diagnosis not present

## 2022-05-01 DIAGNOSIS — G4700 Insomnia, unspecified: Secondary | ICD-10-CM | POA: Diagnosis present

## 2022-05-01 DIAGNOSIS — G9341 Metabolic encephalopathy: Secondary | ICD-10-CM | POA: Diagnosis not present

## 2022-05-01 DIAGNOSIS — M6259 Muscle wasting and atrophy, not elsewhere classified, multiple sites: Secondary | ICD-10-CM | POA: Diagnosis not present

## 2022-05-01 DIAGNOSIS — M545 Low back pain, unspecified: Secondary | ICD-10-CM | POA: Diagnosis present

## 2022-05-01 DIAGNOSIS — R531 Weakness: Secondary | ICD-10-CM | POA: Diagnosis not present

## 2022-05-01 DIAGNOSIS — E538 Deficiency of other specified B group vitamins: Secondary | ICD-10-CM | POA: Diagnosis present

## 2022-05-01 DIAGNOSIS — F17210 Nicotine dependence, cigarettes, uncomplicated: Secondary | ICD-10-CM | POA: Diagnosis present

## 2022-05-01 DIAGNOSIS — E876 Hypokalemia: Secondary | ICD-10-CM | POA: Diagnosis not present

## 2022-05-01 DIAGNOSIS — S0990XA Unspecified injury of head, initial encounter: Secondary | ICD-10-CM | POA: Diagnosis present

## 2022-05-01 DIAGNOSIS — M1990 Unspecified osteoarthritis, unspecified site: Secondary | ICD-10-CM | POA: Diagnosis not present

## 2022-05-01 DIAGNOSIS — R053 Chronic cough: Secondary | ICD-10-CM | POA: Diagnosis present

## 2022-05-01 DIAGNOSIS — F419 Anxiety disorder, unspecified: Secondary | ICD-10-CM | POA: Diagnosis present

## 2022-05-01 DIAGNOSIS — K5900 Constipation, unspecified: Secondary | ICD-10-CM | POA: Diagnosis not present

## 2022-05-01 DIAGNOSIS — I959 Hypotension, unspecified: Secondary | ICD-10-CM | POA: Diagnosis present

## 2022-05-01 DIAGNOSIS — Z7401 Bed confinement status: Secondary | ICD-10-CM | POA: Diagnosis not present

## 2022-05-01 DIAGNOSIS — R262 Difficulty in walking, not elsewhere classified: Secondary | ICD-10-CM | POA: Diagnosis not present

## 2022-05-01 DIAGNOSIS — R2681 Unsteadiness on feet: Secondary | ICD-10-CM | POA: Diagnosis not present

## 2022-05-01 DIAGNOSIS — Z9181 History of falling: Secondary | ICD-10-CM | POA: Diagnosis not present

## 2022-05-01 DIAGNOSIS — F32A Depression, unspecified: Secondary | ICD-10-CM | POA: Diagnosis not present

## 2022-05-01 DIAGNOSIS — E039 Hypothyroidism, unspecified: Secondary | ICD-10-CM | POA: Diagnosis present

## 2022-05-01 DIAGNOSIS — G8929 Other chronic pain: Secondary | ICD-10-CM | POA: Diagnosis present

## 2022-05-01 DIAGNOSIS — Z743 Need for continuous supervision: Secondary | ICD-10-CM | POA: Diagnosis not present

## 2022-05-01 DIAGNOSIS — R1311 Dysphagia, oral phase: Secondary | ICD-10-CM | POA: Diagnosis not present

## 2022-05-01 LAB — VITAMIN B12: Vitamin B-12: 127 pg/mL — ABNORMAL LOW (ref 180–914)

## 2022-05-01 LAB — FOLATE: Folate: 7.3 ng/mL (ref >5.9)

## 2022-05-01 MED ORDER — GABAPENTIN 100 MG PO CAPS: 200.0000 mg | ORAL_CAPSULE | Freq: Three times a day (TID) | ORAL | Status: DC

## 2022-05-01 MED ORDER — CLONAZEPAM 2 MG PO TABS: 2.0000 mg | ORAL_TABLET | Freq: Every day | ORAL | 0 refills | Status: DC

## 2022-05-01 MED ORDER — FOLIC ACID 1 MG PO TABS: 1.0000 mg | ORAL_TABLET | Freq: Every day | ORAL | 0 refills | Status: AC

## 2022-05-01 MED ORDER — CYANOCOBALAMIN 1000 MCG/ML IJ SOLN: INTRAMUSCULAR | 0 refills | Status: AC

## 2022-05-01 MED ORDER — POLYETHYLENE GLYCOL 3350 17 G PO PACK: 17.0000 g | PACK | Freq: Every day | ORAL | Status: DC | PRN

## 2022-05-01 MED ORDER — CYANOCOBALAMIN 1000 MCG/ML IJ SOLN
1000.0000 ug | Freq: Every day | INTRAMUSCULAR | Status: DC
  Administered 2022-05-01: 1000 ug via INTRAMUSCULAR
  Filled 2022-05-01: qty 1

## 2022-05-01 NOTE — TOC Transition Note (Signed)
Transition of Care [facility]) - CM/SW Discharge Note   Patient Details  Name: Karla Foster MRN: [id number] Date of Birth: 17-Jul-1955  [facility] of [facility]) CM/SW Contact:  Geralynn Ochs, LCSW Phone Number: 05/01/2022, 3:18 PM   Clinical Narrative:   CSW received update from MD that patient requested going home. CSW asked mobility specialist to ambulate patient to see if home is a safe plan, and patient unable to perform appropriately for a home discharge. CSW met with patient to discuss, and she is agreeable to SNF. Per patient, she is worried about her husband as she is his caregiver and no one else can take over caring for him. She has family checking on him periodically, but she's still worried. CSW encouraged her to focus on recovering her own strength as quickly as she can so she can return to her role as caregiver; she cannot care for her husband the way she is today. Patient agreed.   CSW sent discharge to [facility], transport arranged with [facility] for next available.  Nurse to call report to [phone], Room 309.    Final next level of care: Skilled Nursing Facility Barriers to Discharge: Barriers Resolved   Patient Goals and CMS Choice CMS Medicare.gov Compare Post Acute Care list provided to:: Patient Choice offered to / list presented to : Patient  Discharge Placement                Patient chooses bed at: [facility] Patient to be transferred to facility by: [place] Name of family member notified: Self Patient and family notified of of transfer: 05/01/22  Discharge Plan and Services Additional resources added to the After Visit Summary for     Discharge Planning Services: CM Consult Post Acute Care Choice: Durable Medical Equipment, Home Health          DME Arranged: [place] Rile DME Agency: [employer] Date DME Agency Contacted: 04/29/22 Time DME Agency Contacted: [phone] Representative spoke with at DME Agency: [place]: PT,  OT [place] Agency: [place] Date [place]: 04/29/22 Time [place]: [phone] Representative spoke with at [place]: [place]) Interventions SDOH Screenings   Food Insecurity: Food Insecurity Present (04/28/2022)  Housing: Low Risk  (04/28/2022)  Transportation Needs: No Transportation Needs (04/28/2022)  Utilities: Not At Risk (04/28/2022)  Depression (PHQ2-9): Low Risk  (05/12/2021)  Financial Resource Strain: Medium Risk (05/12/2021)  Physical Activity: Inactive (05/12/2021)  Social Connections: Moderately Integrated (05/12/2021)  Stress: Stress Concern Present (05/12/2021)  Tobacco Use: High Risk (04/27/2022)     Readmission Risk Interventions     No data to display

## 2022-05-01 NOTE — Progress Notes (Signed)
Nurse requested Mobility Specialist to perform oxygen saturation test with pt which includes removing pt from oxygen both at rest and while ambulating.  Below are the results from that testing.     Patient Saturations on Room Air at Rest = spO2 92%  Patient Saturations on Room Air while Ambulating = sp02 86% .  Rested and performed pursed lip breathing for 1 minute with sp02 at 87%.  Patient Saturations on 1 Liters of oxygen while Ambulating = sp02 95%  At end of testing pt left in room on 1  Liters of oxygen.  Reported results to nurse.

## 2022-05-01 NOTE — Progress Notes (Signed)
Mobility Specialist: Progress Note   05/01/22 1223  Mobility  Activity Ambulated with assistance in room  Level of Assistance Minimal assist, patient does 75% or more  Assistive Device Front wheel walker  Distance Ambulated (ft) 30 ft  Activity Response Tolerated fair  Mobility Referral Yes  $Mobility charge 1 Mobility   Pre-Mobility:     On 1 L/min [place]: 90 HR, 94/75 (83) BP, 97% SpO2    On RA: 87 HR, 104/86 (91) BP, 92% SpO2 During Mobility on RA: 86% SpO2 Post-Mobility on 1 L/min [place]: 83 HR, 100/70 (79) BP, 95% SpO2  Pt received in the bed and agreeable to mobility. MinA with bed mobility as well as to stand. C/o Rt side pain pt states is from her fall as well as 4/10 chest pain during ambulation. C/o mild SOB during ambulation on RA with sats as seen above. Increased [place] flow to 1 L/min [place] with sats maintaining >90% throughout. Pt back to bed after session with call bell and phone at her side. Bed alarm is on.   Rosston Weiland Tomich Mobility Specialist Please contact via SecureChat or Rehab office at [phone]

## 2022-05-01 NOTE — Progress Notes (Signed)
Patient back on unit from MRI.

## 2022-05-01 NOTE — Discharge Summary (Signed)
Physician Discharge Summary  Karla Foster [id number] DOB: 11/13/1955 DOA: 04/27/2022  PCP: Vivi Barrack, MD  Admit date: 04/27/2022 Discharge date: 05/01/2022  Admitted From: Home  Discharge disposition: Skilled nursing facility.  Recommendations for Outpatient Follow-Up:   Follow up with your primary care provider at the skilled nursing facility in 3 to 5 days. Check CBC, BMP, magnesium in the next visit Vitamin B1, copper and intrinsic factor antibodies have been sent for evaluation of macrocytosis from the hospital which needs to be followed up. Patient has been prescribed vitamin B12 injections due to severe vitamin B12 deficiency likely causing her symptoms. If patient continues to have ongoing dizziness might benefit from outpatient ENT/audiology evaluation.  Discharge Diagnosis:   Principal Problem:   Dizziness Active Problems:   Dyslipidemia, she does not take meds   GERD (gastroesophageal reflux disease)   Hypokalemia   BPPV (benign paroxysmal positional vertigo)   Discharge Condition: Improved.  Diet recommendation:  Regular.  Wound care: None.  Code status: Full.   History of Present Illness:   Karla Foster is a 67 y.o. female with medical history significant for insomnia, chronic back pain, hypothyroidism, GERD, OA, hypertension, hyperlipidemia, intermittent dizziness x 1 year, presented to the hospital with increasing weakness in his legs with fall.  In the ED, vitals were stable.  Labs showed potassium of 3.4 with sodium of 133.  WBC was 12.3.  Chest x-ray with some cardiomegaly.  Urinalysis was negative for infection.  Patient was very dizzy.  Workup in the ED was unremarkable including MRI of the brain.  When she was getting ready to be discharged she was unable to ambulate and was very dizzy so patient was considered for admission to hospital.      Hospital Course:   Following conditions were addressed during hospitalization as  listed below,  Dizziness Initially suspected BPPV but did not improve with meclizine or Epley maneuver.  Patient noted to have severe vitamin B12 deficiency could be contributing to her symptoms as per neurology.  Orthostatic vitals were negative.  MRI negative for acute finding but empty sella.   Communicated with ENT on-call Dr. Janace Hoard who stated that he did not have further recommendations.  Neurology was consulted for concerns of neurogenic cause for dizziness MRI of the cervical spine was performed without any demyelinating lesions but there are degenerative changes.  Vitamin B12 level was noted to be very low so recommendation is vitamin B12 IM for 7 days followed by weekly for  4 weeks and monthly for next 3 months.  Also continue folic acid.  Follow-up vitamin B1, copper and intrinsic factor antibody   Physical debility and fall Physical therapy, OT recommendations for skilled nursing facility.  Patient had initially hesitated but at this time as agreed to rehabilitation at the skilled nursing facility.  Patient has high risk for falls   Hyperlipidemia Continue Lipitor   GERD Is on Nexium.  Should really consider discontinuation in the context of vitamin B12 deficiency.   Chronic anxiety/depression Continue Cymbalta, chronic pain.   Polyneuropathy Continue gabapentin at the decreased dose.  Continue physical therapy at rehab.   Hypokalemia.  Improved after replacement.   Hypotension.  Borderline low.  Not on antihypertensives.  Encouraged oral hydration.  Patient did not have orthostatic hypotension.   Disposition.  At this time, patient is stable for disposition to skilled nursing facility.  Medical Consultants:   Verbal consult with ENT Neurology  Procedures:    Epley maneuver Subjective:  Today, patient seen and examined at bedside.  Has difficulty ambulation and with gait and is unsafe to go home.  Has some dizziness as well.  Discharge Exam:   Vitals:   04/30/22  2333 05/01/22 1104  BP: 111/60 (!) 96/51  Pulse: 91 90  Resp: 17 19  Temp: 98.9 F (37.2 C) 98.7 F (37.1 C)  SpO2: 93% 94%   Vitals:   04/30/22 1628 04/30/22 1950 04/30/22 2333 05/01/22 1104  BP: (!) 100/50 (!) 101/54 111/60 (!) 96/51  Pulse: 80 81 91 90  Resp: '16 18 17 19  '$ Temp: 98.5 F (36.9 C) 98.4 F (36.9 C) 98.9 F (37.2 C) 98.7 F (37.1 C)  TempSrc: Oral Oral Oral Oral  SpO2: 99% 98% 93% 94%  Weight:      Height:       General: Alert awake, not in obvious distress, appears deconditioned and weak. HENT: pupils equally reacting to light,  No scleral pallor or icterus noted. Oral mucosa is moist.  Chest:  Clear breath sounds.  Diminished breath sounds bilaterally. No crackles or wheezes.  CVS: S1 &S2 heard. No murmur.  Regular rate and rhythm. Abdomen: Soft, nontender, nondistended.  Bowel sounds are heard.   Extremities: No cyanosis, clubbing or edema.  Peripheral pulses are palpable. Psych: Alert, awake and oriented, normal mood CNS:  No cranial nerve deficits.  Moves all extremities.  Generalized weakness noted. Skin: Warm and dry.  No rashes noted.  The results of significant diagnostics from this hospitalization (including imaging, microbiology, ancillary and laboratory) are listed below for reference.     Diagnostic Studies:   MR LUMBAR SPINE WO CONTRAST  Result Date: 04/27/2022 CLINICAL DATA:  Low back pain, prior surgery, new symptoms EXAM: MRI LUMBAR SPINE WITHOUT CONTRAST TECHNIQUE: Multiplanar, multisequence MR imaging of the lumbar spine was performed. No intravenous contrast was administered. COMPARISON:  06/08/2020 MRI lumbar spine FINDINGS: Segmentation:  5 lumbar type vertebral bodies. Alignment:  Mild dextrocurvature.  No significant listhesis. Vertebrae: Redemonstrated L1 compression fracture with interbody cage and posterior fixation visualized at T12-L3, but noted to extend superiorly into the lower thoracic spine. Susceptibility artifact from the  hardware limits evaluation these levels. Otherwise normal marrow signal. No acute fracture is seen. No suspicious osseous lesion or evidence of discitis. Conus medullaris and cauda equina: Conus extends to the L2 level. Conus and cauda equina appear normal. Paraspinal and other soft tissues: Right extrarenal pelvis. No lymphadenopathy. Disc levels: T12-L1: Unchanged retropulsion at the posterosuperior aspect of L1, which indents the thecal sac and may contact the ventral aspect of the conus. Mild spinal canal stenosis, unchanged. No neural foraminal narrowing. L1-L2: Posterior fusion. No spinal canal stenosis or neural foraminal narrowing. L2-L3: Minimal disc bulge. Posterior fusion. No spinal canal stenosis or neural foraminal narrowing. L3-L4: No significant disc bulge. No spinal canal stenosis or neural foraminal narrowing. L4-L5: Mild disc bulge. Mild facet arthropathy. No spinal canal stenosis. Narrowing of the lateral recesses. Mild-to-moderate bilateral neural foraminal narrowing, unchanged. L5-S1: Minimal disc bulge. Narrowing of the lateral recesses. No spinal canal stenosis or neural foraminal narrowing. IMPRESSION: 1. Posterior fusion from the lower thoracic spine through L3 and interbody cage at remote L1 fracture, which appear unchanged from prior exam. Chronic retropulsion at the posterosuperior aspect of L1 causes mild spinal canal stenosis at T12-L1 and may contact the conus, unchanged. 2. L4-L5 mild-to-moderate bilateral neural foraminal narrowing, unchanged. 3. Narrowing of the lateral recesses at L4-L5 and L5-S1 could affect the descending L5 and S1  nerve roots, respectively Electronically Signed   By: Merilyn Baba M.D.   On: 04/27/2022 22:20   MR BRAIN WO CONTRAST  Result Date: 04/27/2022 CLINICAL DATA:  Fall, headache, stroke suspected EXAM: MRI HEAD WITHOUT CONTRAST TECHNIQUE: Multiplanar, multiecho pulse sequences of the brain and surrounding structures were obtained without intravenous  contrast. COMPARISON:  06/08/2020, correlation is also made with same day CT head FINDINGS: Brain: No restricted diffusion to suggest acute or subacute infarct. No acute hemorrhage, mass, mass effect, or midline shift. No hydrocephalus or extra-axial collection. Empty sella. Normal craniocervical junction. Mildly advanced cerebral atrophy for age. Scattered T2 hyperintense signal in the periventricular white matter, likely the sequela of minimal chronic small vessel ischemic disease. No hemosiderin deposition to suggest remote hemorrhage. Vascular: Normal arterial flow voids. Skull and upper cervical spine: Normal marrow signal. Sinuses/Orbits: Clear paranasal sinuses. Vertically tortuous optic nerves. Status post bilateral lens replacements. Other: The mastoid air cells are well aerated. IMPRESSION: 1. No acute intracranial process. No evidence of acute or subacute infarct. 2. Empty sella and tortuous optic nerves, which are nonspecific but can be seen in the setting of idiopathic intracranial hypertension. Electronically Signed   By: Merilyn Baba M.D.   On: 04/27/2022 21:57   CT Lumbar Spine Wo Contrast  Result Date: 04/27/2022 CLINICAL DATA:  Lumbar radiculopathy, trauma. Both legs gave out today resulting in a fall. EXAM: CT LUMBAR SPINE WITHOUT CONTRAST TECHNIQUE: Multidetector CT imaging of the lumbar spine was performed without intravenous contrast administration. Multiplanar CT image reconstructions were also generated. RADIATION DOSE REDUCTION: This exam was performed according to the departmental dose-optimization program which includes automated exposure control, adjustment of the mA and/or kV according to patient size and/or use of iterative reconstruction technique. COMPARISON:  MRI lumbar spine 06/08/2020. CT lumbar spine 07/10/2016. Lumbar spine radiographs 06/28/2016 FINDINGS: Segmentation: 5 lumbar type vertebral bodies. Alignment: Mild retrolisthesis of L1, unchanged since prior studies.  Otherwise normal alignment. Vertebrae: There is an old compression fracture of the L1 vertebra with loss of height unchanged since prior study. Since the prior CT and plain films, there has been interval postoperative change with placement of hardware spacer at L1. Posterior rod and screw fixation beginning above the T11 level and extending to the L3 level. Surgical hardware as visualized appears intact without evidence of significant loosening. Bony fusion of the posterior elements. No new acute or displaced fractures are identified. Paraspinal and other soft tissues: No abnormal paraspinal soft tissue mass or infiltration. Disc levels: Degenerative changes with mild disc space narrowing and osteophyte formation most prominent at L4-5, where there is mild degenerative disc disease. IMPRESSION: 1. No acute displaced fractures are identified. 2. Old compression deformity of L1. 3. Postoperative changes with hardware spacer at L1, posterior rod and screw fixation from the thoracic spine to L3, and posterior bony fusion. Electronically Signed   By: Lucienne Capers M.D.   On: 04/27/2022 16:47   CT Cervical Spine Wo Contrast  Result Date: 04/27/2022 CLINICAL DATA:  Neck trauma.  Patient fell today. EXAM: CT CERVICAL SPINE WITHOUT CONTRAST TECHNIQUE: Multidetector CT imaging of the cervical spine was performed without intravenous contrast. Multiplanar CT image reconstructions were also generated. RADIATION DOSE REDUCTION: This exam was performed according to the departmental dose-optimization program which includes automated exposure control, adjustment of the mA and/or kV according to patient size and/or use of iterative reconstruction technique. COMPARISON:  MRI cervical spine 06/08/2020. Cervical spine radiographs 03/30/2006 FINDINGS: Alignment: Normal. Skull base and vertebrae: No acute fracture.  No primary bone lesion or focal pathologic process. Soft tissues and spinal canal: No prevertebral fluid or swelling.  No visible canal hematoma. Disc levels: Degenerative changes with disc space narrowing and mild endplate osteophyte formation throughout. Upper chest: Lung apices are clear. Other: None. IMPRESSION: Normal alignment of the cervical spine. No acute displaced fractures. Mild degenerative changes. Electronically Signed   By: Lucienne Capers M.D.   On: 04/27/2022 16:42   CT Head Wo Contrast  Result Date: 04/27/2022 CLINICAL DATA:  Posttraumatic headache. Fell today. Patient struck the back of head. EXAM: CT HEAD WITHOUT CONTRAST TECHNIQUE: Contiguous axial images were obtained from the base of the skull through the vertex without intravenous contrast. RADIATION DOSE REDUCTION: This exam was performed according to the departmental dose-optimization program which includes automated exposure control, adjustment of the mA and/or kV according to patient size and/or use of iterative reconstruction technique. COMPARISON:  MRI brain 06/08/2020.  CT head 04/10/2006 FINDINGS: Brain: No evidence of acute infarction, hemorrhage, hydrocephalus, extra-axial collection or mass lesion/mass effect. Mild cerebral atrophy. Vascular: No hyperdense vessel or unexpected calcification. Skull: Normal. Negative for fracture or focal lesion. Sinuses/Orbits: Paranasal sinuses and mastoid air cells are clear except for a small retention cyst in the right maxillary antrum. Other: None. IMPRESSION: No acute intracranial abnormalities. Electronically Signed   By: Lucienne Capers M.D.   On: 04/27/2022 16:39   DG Chest Portable 1 View  Result Date: 04/27/2022 CLINICAL DATA:  Fall. EXAM: PORTABLE CHEST 1 VIEW COMPARISON:  Chest radiographs 02/02/2019 FINDINGS: The cardiac silhouette is mildly enlarged. The lungs are hypoinflated with bronchovascular crowding in the lung bases. No overt pulmonary edema, segmental airspace consolidation, pneumothorax, or sizable pleural effusion is identified (with note made of incomplete imaging of the left  lateral costophrenic angle). No acute osseous abnormality is seen. IMPRESSION: Cardiomegaly and hypoinflation. Electronically Signed   By: Logan Bores M.D.   On: 04/27/2022 15:42   DG Pelvis Portable  Result Date: 04/27/2022 CLINICAL DATA:  Fall EXAM: PORTABLE PELVIS 1-2 VIEWS COMPARISON:  10/22/2017 FINDINGS: There is no evidence of pelvic fracture or diastasis. Hip joints intact without evidence of fracture or dislocation. No pelvic bone lesions are seen. IMPRESSION: Negative. Electronically Signed   By: Davina Poke D.O.   On: 04/27/2022 15:41     Labs:   Basic Metabolic Panel: Recent Labs  Lab 04/27/22 1504 04/27/22 2325 04/28/22 0410 04/29/22 0414  NA 133*  --  138 138  K 3.4*  --  3.4* 4.0  CL 97*  --  104 105  CO2 28  --  27 24  GLUCOSE 118*  --  100* 97  BUN 7*  --  6* 8  CREATININE 0.76 0.76 0.74 0.65  CALCIUM 8.9  --  8.4* 8.4*  MG  --   --  2.0 1.8  PHOS  --   --  3.7  --    GFR Estimated Creatinine Clearance: 76.9 mL/min (by C-G formula based on SCr of 0.65 mg/dL). Liver Function Tests: Recent Labs  Lab 04/27/22 1504  AST 46*  ALT 21  ALKPHOS 86  BILITOT 0.5  PROT 6.6  ALBUMIN 3.5   No results for input(s): "LIPASE", "AMYLASE" in the last 168 hours. No results for input(s): "AMMONIA" in the last 168 hours. Coagulation profile No results for input(s): "INR", "PROTIME" in the last 168 hours.  CBC: Recent Labs  Lab 04/27/22 1504 04/27/22 2325 04/28/22 0410 04/29/22 0414  WBC 12.0* 12.3* 9.8 12.8*  NEUTROABS  9.7*  --   --   --   HGB 14.1 13.3 12.4 11.5*  HCT 42.8 40.0 37.3 35.7*  MCV 102.4* 102.0* 102.2* 104.4*  PLT 285 277 255 225   Cardiac Enzymes: No results for input(s): "CKTOTAL", "CKMB", "CKMBINDEX", "TROPONINI" in the last 168 hours. BNP: Invalid input(s): "POCBNP" CBG: Recent Labs  Lab 04/27/22 1514  GLUCAP 110*   D-Dimer No results for input(s): "DDIMER" in the last 72 hours. Hgb A1c No results for input(s): "HGBA1C" in the  last 72 hours. Lipid Profile No results for input(s): "CHOL", "HDL", "LDLCALC", "TRIG", "CHOLHDL", "LDLDIRECT" in the last 72 hours. Thyroid function studies No results for input(s): "TSH", "T4TOTAL", "T3FREE", "THYROIDAB" in the last 72 hours.  Invalid input(s): "FREET3" Anemia work up Recent Labs    04/30/22 2354 05/01/22 0827  VITAMINB12 127*  --   FOLATE  --  7.3   Microbiology No results found for this or any previous visit (from the past 240 hour(s)).   Discharge Instructions:   Discharge Instructions     Diet - low sodium heart healthy   Complete by: As directed    Discharge instructions   Complete by: As directed    Take time to change positions.  Stay hydrated.  Take precautions for dizziness and falls.  Continue physical therapy at the skilled nursing facility..  Follow-up with your primary care provider at the skilled nursing facility in 3 to 5 days.  Check blood work at that time.   Increase activity slowly   Complete by: As directed       Allergies as of 05/01/2022       Reactions   Amoxicillin Other (See Comments)   Tongue swelling        Medication List     STOP taking these medications    doxycycline 100 MG tablet Commonly known as: VIBRA-TABS   Linzess 145 MCG Caps capsule Generic drug: linaclotide   predniSONE 50 MG tablet Commonly known as: DELTASONE   Wegovy 0.25 MG/0.5ML [place] Generic drug: Semaglutide-Weight Management       TAKE these medications    albuterol 108 (90 Base) MCG/ACT inhaler Commonly known as: VENTOLIN HFA TAKE 2 PUFFS BY MOUTH EVERY 6 HOURS AS NEEDED FOR WHEEZE OR SHORTNESS OF BREATH What changed: See the new instructions.   atorvastatin 40 MG tablet Commonly known as: LIPITOR TAKE 1 TABLET BY MOUTH EVERY DAY   clonazePAM 2 MG tablet Commonly known as: KLONOPIN Take 1 tablet (2 mg total) by mouth at bedtime. What changed:  medication strength how much to take how to take this when to take  this additional instructions   cyanocobalamin 1000 MCG/ML injection Commonly known as: VITAMIN B12 Inject 1 mL (1,000 mcg total) into the muscle daily for 7 days, THEN 1 mL (1,000 mcg total) once a week for 28 days, THEN 1 mL (1,000 mcg total) every 30 (thirty) days. Start taking on: May 02, 2022   DULoxetine 60 MG capsule Commonly known as: CYMBALTA TAKE 1 CAPSULE (60 MG TOTAL) BY MOUTH AT BEDTIME. What changed: See the new instructions.   esomeprazole 40 MG capsule Commonly known as: NEXIUM TAKE 1 CAPSULE (40 MG TOTAL) BY MOUTH DAILY AT 12 NOON.   folic acid 1 MG tablet Commonly known as: FOLVITE Take 1 tablet (1 mg total) by mouth daily.   gabapentin 100 MG capsule Commonly known as: NEURONTIN Take 2 capsules (200 mg total) by mouth 3 (three) times daily. What changed:  medication strength  See the new instructions.   polyethylene glycol 17 g packet Commonly known as: MIRALAX / GLYCOLAX Take 17 g by mouth daily as needed for mild constipation or moderate constipation.   trazodone 300 MG tablet Commonly known as: DESYREL TAKE 1 TABLET BY MOUTH EVERYDAY AT BEDTIME What changed: See the new instructions.               Durable Medical Equipment  (From admission, onward)           Start     Ordered   04/29/22 0946  For home use only DME Walker rolling  Once       Question Answer Comment  Walker: With 5 Inch Wheels   Patient needs a walker to treat with the following condition Weakness      04/29/22 0945            Follow-up Information     [facility] Follow up.   Specialty: [place] Why: for home health services, they will call you in 1-2 days to set up a time to come out to your house Contact information: [address] [place] [phone]                  Time coordinating discharge: 39 minutes  Signed:  Katrell Milhorn  [facility] 05/01/2022, 1:58 PM

## 2022-05-01 NOTE — Progress Notes (Signed)
Patient off the unit for MRI.

## 2022-05-01 NOTE — Progress Notes (Addendum)
Pt being transported to [place] via [facility].  Discharge paperwork and prescription in envelope for transport.  Pt has clothing, cellphone, upper and lower dentures, and necklace in a belongings bag in her possession.  She stated she thought she brought a wallet with her, but I have looked through the chart and there is no mention of it, only the above belongings.  Report called to [facility] by Lily Lovings.

## 2022-05-01 NOTE — Plan of Care (Signed)
  Problem: Education: Goal: Knowledge of General Education information will improve Description: Including pain rating scale, medication(s)/side effects and non-pharmacologic comfort measures 05/01/2022 1600 by Thurnell Garbe, RN Outcome: Adequate for Discharge 05/01/2022 1559 by Thurnell Garbe, RN Outcome: Adequate for Discharge

## 2022-05-02 DIAGNOSIS — K219 Gastro-esophageal reflux disease without esophagitis: Secondary | ICD-10-CM | POA: Diagnosis not present

## 2022-05-02 DIAGNOSIS — R2681 Unsteadiness on feet: Secondary | ICD-10-CM | POA: Diagnosis not present

## 2022-05-02 DIAGNOSIS — Z9181 History of falling: Secondary | ICD-10-CM | POA: Diagnosis not present

## 2022-05-02 DIAGNOSIS — M6250 Muscle wasting and atrophy, not elsewhere classified, unspecified site: Secondary | ICD-10-CM | POA: Diagnosis not present

## 2022-05-02 DIAGNOSIS — F32A Depression, unspecified: Secondary | ICD-10-CM | POA: Diagnosis not present

## 2022-05-02 DIAGNOSIS — E785 Hyperlipidemia, unspecified: Secondary | ICD-10-CM | POA: Diagnosis not present

## 2022-05-02 DIAGNOSIS — R42 Dizziness and giddiness: Secondary | ICD-10-CM | POA: Diagnosis not present

## 2022-05-02 DIAGNOSIS — M6281 Muscle weakness (generalized): Secondary | ICD-10-CM | POA: Diagnosis not present

## 2022-05-02 LAB — INTRINSIC FACTOR ANTIBODIES: Intrinsic Factor: 1.1 AU/mL (ref 0.0–1.1)

## 2022-05-03 LAB — COPPER, SERUM: Copper: 95 ug/dL (ref 80–158)

## 2022-05-03 LAB — VITAMIN B1: Vitamin B1 (Thiamine): 102.9 nmol/L (ref 66.5–200.0)

## 2022-05-07 ENCOUNTER — Telehealth: Payer: Self-pay | Admitting: Family Medicine

## 2022-05-07 DIAGNOSIS — G44201 Tension-type headache, unspecified, intractable: Secondary | ICD-10-CM | POA: Diagnosis not present

## 2022-05-07 NOTE — Telephone Encounter (Signed)
Copied from [place] [id number]. Topic: Medicare AWV >> May 07, 2022 10:22 AM Gillis Santa wrote: Reason for CRM: Called patient to schedule Medicare Annual Wellness Visit (AWV). Left message for patient to call back and schedule Medicare Annual Wellness Visit (AWV).  Last date of AWV: 05/12/2021  Please schedule an appointment at any time with Otila Kluver, [facility]  Please reschedule AWVS with health coach Otila Kluver, [place].  If any questions, please contact me at [phone].  Thank you ,  Shaune Pollack [facility] AWV TEAM Direct Dial [phone]

## 2022-05-07 NOTE — Telephone Encounter (Signed)
Contacted Karla Foster to schedule their annual wellness visit. Appointment made for 05/15/2022.  [place] Direct Dial [phone]

## 2022-05-09 DIAGNOSIS — M6281 Muscle weakness (generalized): Secondary | ICD-10-CM | POA: Diagnosis not present

## 2022-05-09 DIAGNOSIS — R2681 Unsteadiness on feet: Secondary | ICD-10-CM | POA: Diagnosis not present

## 2022-05-09 DIAGNOSIS — M6250 Muscle wasting and atrophy, not elsewhere classified, unspecified site: Secondary | ICD-10-CM | POA: Diagnosis not present

## 2022-05-09 DIAGNOSIS — Z9181 History of falling: Secondary | ICD-10-CM | POA: Diagnosis not present

## 2022-05-09 DIAGNOSIS — R42 Dizziness and giddiness: Secondary | ICD-10-CM | POA: Diagnosis not present

## 2022-05-10 DIAGNOSIS — R42 Dizziness and giddiness: Secondary | ICD-10-CM | POA: Diagnosis not present

## 2022-05-11 DIAGNOSIS — R296 Repeated falls: Secondary | ICD-10-CM | POA: Diagnosis not present

## 2022-05-15 ENCOUNTER — Ambulatory Visit (INDEPENDENT_AMBULATORY_CARE_PROVIDER_SITE_OTHER): Payer: 59

## 2022-05-15 VITALS — Wt 190.0 lb

## 2022-05-15 DIAGNOSIS — Z0000 Encounter for general adult medical examination without abnormal findings: Secondary | ICD-10-CM
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
## 2022-05-15 NOTE — Patient Instructions (Signed)
[name] , Thank you for taking time to come for your Medicare Wellness Visit. I appreciate your ongoing commitment to your health goals. Please review the following plan we discussed and let me know if I can assist you in the future.   These are the goals we discussed:  Goals      Patient Stated     Lose weight      Patient Stated     None at this time      Patient Stated     Be able to walk again         This is a list of the screening recommended for you and due dates:  Health Maintenance  Topic Date Due   Zoster (Shingles) Vaccine (1 of 2) Never done   Pneumonia Vaccine (2 of 2 - PCV) 10/29/2018   DTaP/Tdap/Td vaccine (2 - Tdap) 07/12/2019   Mammogram  10/01/2021   COVID-19 Vaccine (5 - 2023-24 season) 01/24/2022   DEXA scan (bone density measurement)  01/09/2023*   Hepatitis C Screening: USPSTF Recommendation to screen - Ages 18-79 yo.  01/09/2023*   Medicare Annual Wellness Visit  05/15/2023   Colon Cancer Screening  04/19/2025   Flu Shot  Completed   HPV Vaccine  Aged Out  *Topic was postponed. The date shown is not the original due date.    Advanced directives: Advance directive discussed with you today. Even though you declined this today please call our office should you change your mind and we can give you the proper paperwork for you to fill out.   Conditions/risks identified: be able to walk again   Next appointment: Follow up in one year for your annual wellness visit    Preventive Care 65 Years and Older, Female Preventive care refers to lifestyle choices and visits with your health care provider that can promote health and wellness. What does preventive care include? A yearly physical exam. This is also called an annual well check. Dental exams once or twice a year. Routine eye exams. Ask your health care provider how often you should have your eyes checked. Personal lifestyle choices, including: Daily care of your teeth and gums. Regular  physical activity. Eating a healthy diet. Avoiding tobacco and drug use. Limiting alcohol use. Practicing safe sex. Taking low-dose aspirin every day. Taking vitamin and mineral supplements as recommended by your health care provider. What happens during an annual well check? The services and screenings done by your health care provider during your annual well check will depend on your age, overall health, lifestyle risk factors, and family history of disease. Counseling  Your health care provider may ask you questions about your: Alcohol use. Tobacco use. Drug use. Emotional well-being. Home and relationship well-being. Sexual activity. Eating habits. History of falls. Memory and ability to understand (cognition). Work and work Statistician. Reproductive health. Screening  You may have the following tests or measurements: Height, weight, and BMI. Blood pressure. Lipid and cholesterol levels. These may be checked every 5 years, or more frequently if you are over 76 years old. Skin check. Lung cancer screening. You may have this screening every year starting at age 41 if you have a 30-pack-year history of smoking and currently smoke or have quit within the past 15 years. Fecal occult blood test (FOBT) of the stool. You may have this test every year starting at age 62. Flexible sigmoidoscopy or colonoscopy. You may have a sigmoidoscopy every 5 years or a colonoscopy every 10 years starting  at age 22. Hepatitis C blood test. Hepatitis B blood test. Sexually transmitted disease (STD) testing. Diabetes screening. This is done by checking your blood sugar (glucose) after you have not eaten for a while (fasting). You may have this done every 1-3 years. Bone density scan. This is done to screen for osteoporosis. You may have this done starting at age 19. Mammogram. This may be done every 1-2 years. Talk to your health care provider about how often you should have regular mammograms. Talk  with your health care provider about your test results, treatment options, and if necessary, the need for more tests. Vaccines  Your health care provider may recommend certain vaccines, such as: Influenza vaccine. This is recommended every year. Tetanus, diphtheria, and acellular pertussis (Tdap, Td) vaccine. You may need a Td booster every 10 years. Zoster vaccine. You may need this after age 78. Pneumococcal 13-valent conjugate (PCV13) vaccine. One dose is recommended after age 70. Pneumococcal polysaccharide (PPSV23) vaccine. One dose is recommended after age 40. Talk to your health care provider about which screenings and vaccines you need and how often you need them. This information is not intended to replace advice given to you by your health care provider. Make sure you discuss any questions you have with your health care provider. Document Released: 03/04/2015 Document Revised: 10/26/2015 Document Reviewed: 12/07/2014 Elsevier Interactive Patient Education  2017 [employer] Prevention in the Home Falls can cause injuries. They can happen to people of all ages. There are many things you can do to make your home safe and to help prevent falls. What can I do on the outside of my home? Regularly fix the edges of walkways and driveways and fix any cracks. Remove anything that might make you trip as you walk through a door, such as a raised step or threshold. Trim any bushes or trees on the path to your home. Use bright outdoor lighting. Clear any walking paths of anything that might make someone trip, such as rocks or tools. Regularly check to see if handrails are loose or broken. Make sure that both sides of any steps have handrails. Any raised decks and porches should have guardrails on the edges. Have any leaves, snow, or ice cleared regularly. Use sand or salt on walking paths during winter. Clean up any spills in your garage right away. This includes oil or grease  spills. What can I do in the bathroom? Use night lights. Install grab bars by the toilet and in the tub and shower. Do not use towel bars as grab bars. Use non-skid mats or decals in the tub or shower. If you need to sit down in the shower, use a plastic, non-slip stool. Keep the floor dry. Clean up any water that spills on the floor as soon as it happens. Remove soap buildup in the tub or shower regularly. Attach bath mats securely with double-sided non-slip rug tape. Do not have throw rugs and other things on the floor that can make you trip. What can I do in the bedroom? Use night lights. Make sure that you have a light by your bed that is easy to reach. Do not use any sheets or blankets that are too big for your bed. They should not hang down onto the floor. Have a firm chair that has side arms. You can use this for support while you get dressed. Do not have throw rugs and other things on the floor that can make you trip. What can  I do in the kitchen? Clean up any spills right away. Avoid walking on wet floors. Keep items that you use a lot in easy-to-reach places. If you need to reach something above you, use a strong step stool that has a grab bar. Keep electrical cords out of the way. Do not use floor polish or wax that makes floors slippery. If you must use wax, use non-skid floor wax. Do not have throw rugs and other things on the floor that can make you trip. What can I do with my stairs? Do not leave any items on the stairs. Make sure that there are handrails on both sides of the stairs and use them. Fix handrails that are broken or loose. Make sure that handrails are as long as the stairways. Check any carpeting to make sure that it is firmly attached to the stairs. Fix any carpet that is loose or worn. Avoid having throw rugs at the top or bottom of the stairs. If you do have throw rugs, attach them to the floor with carpet tape. Make sure that you have a light switch at the  top of the stairs and the bottom of the stairs. If you do not have them, ask someone to add them for you. What else can I do to help prevent falls? Wear shoes that: Do not have high heels. Have rubber bottoms. Are comfortable and fit you well. Are closed at the toe. Do not wear sandals. If you use a stepladder: Make sure that it is fully opened. Do not climb a closed stepladder. Make sure that both sides of the stepladder are locked into place. Ask someone to hold it for you, if possible. Clearly mark and make sure that you can see: Any grab bars or handrails. First and last steps. Where the edge of each step is. Use tools that help you move around (mobility aids) if they are needed. These include: Canes. Walkers. Scooters. Crutches. Turn on the lights when you go into a dark area. Replace any light bulbs as soon as they burn out. Set up your furniture so you have a clear path. Avoid moving your furniture around. If any of your floors are uneven, fix them. If there are any pets around you, be aware of where they are. Review your medicines with your doctor. Some medicines can make you feel dizzy. This can increase your chance of falling. Ask your doctor what other things that you can do to help prevent falls. This information is not intended to replace advice given to you by your health care provider. Make sure you discuss any questions you have with your health care provider. Document Released: 12/02/2008 Document Revised: 07/14/2015 Document Reviewed: 03/12/2014 Elsevier Interactive Patient Education  2017 [employer].

## 2022-05-16 DIAGNOSIS — R531 Weakness: Secondary | ICD-10-CM | POA: Diagnosis not present

## 2022-05-16 DIAGNOSIS — G9341 Metabolic encephalopathy: Secondary | ICD-10-CM | POA: Diagnosis not present

## 2022-05-16 DIAGNOSIS — R262 Difficulty in walking, not elsewhere classified: Secondary | ICD-10-CM | POA: Diagnosis not present

## 2022-05-16 DIAGNOSIS — R1311 Dysphagia, oral phase: Secondary | ICD-10-CM | POA: Diagnosis not present

## 2022-05-16 DIAGNOSIS — R2681 Unsteadiness on feet: Secondary | ICD-10-CM | POA: Diagnosis not present

## 2022-05-16 DIAGNOSIS — Z741 Need for assistance with personal care: Secondary | ICD-10-CM | POA: Diagnosis not present

## 2022-05-17 DIAGNOSIS — R42 Dizziness and giddiness: Secondary | ICD-10-CM | POA: Diagnosis not present

## 2022-05-17 DIAGNOSIS — R531 Weakness: Secondary | ICD-10-CM | POA: Diagnosis not present

## 2022-05-17 DIAGNOSIS — R296 Repeated falls: Secondary | ICD-10-CM | POA: Diagnosis not present

## 2022-05-17 DIAGNOSIS — R2681 Unsteadiness on feet: Secondary | ICD-10-CM | POA: Diagnosis not present

## 2022-05-17 DIAGNOSIS — G9341 Metabolic encephalopathy: Secondary | ICD-10-CM | POA: Diagnosis not present

## 2022-05-17 DIAGNOSIS — R1311 Dysphagia, oral phase: Secondary | ICD-10-CM | POA: Diagnosis not present

## 2022-05-17 DIAGNOSIS — Z741 Need for assistance with personal care: Secondary | ICD-10-CM | POA: Diagnosis not present

## 2022-05-17 DIAGNOSIS — M6250 Muscle wasting and atrophy, not elsewhere classified, unspecified site: Secondary | ICD-10-CM | POA: Diagnosis not present

## 2022-05-17 DIAGNOSIS — R262 Difficulty in walking, not elsewhere classified: Secondary | ICD-10-CM | POA: Diagnosis not present

## 2022-05-17 DIAGNOSIS — M6281 Muscle weakness (generalized): Secondary | ICD-10-CM | POA: Diagnosis not present

## 2022-05-17 DIAGNOSIS — Z9181 History of falling: Secondary | ICD-10-CM | POA: Diagnosis not present

## 2022-05-18 DIAGNOSIS — R2681 Unsteadiness on feet: Secondary | ICD-10-CM | POA: Diagnosis not present

## 2022-05-18 DIAGNOSIS — R1311 Dysphagia, oral phase: Secondary | ICD-10-CM | POA: Diagnosis not present

## 2022-05-18 DIAGNOSIS — G9341 Metabolic encephalopathy: Secondary | ICD-10-CM | POA: Diagnosis not present

## 2022-05-18 DIAGNOSIS — R262 Difficulty in walking, not elsewhere classified: Secondary | ICD-10-CM | POA: Diagnosis not present

## 2022-05-18 DIAGNOSIS — R531 Weakness: Secondary | ICD-10-CM | POA: Diagnosis not present

## 2022-05-18 DIAGNOSIS — Z741 Need for assistance with personal care: Secondary | ICD-10-CM | POA: Diagnosis not present

## 2022-05-19 DIAGNOSIS — R2681 Unsteadiness on feet: Secondary | ICD-10-CM | POA: Diagnosis not present

## 2022-05-19 DIAGNOSIS — R531 Weakness: Secondary | ICD-10-CM | POA: Diagnosis not present

## 2022-05-19 DIAGNOSIS — R262 Difficulty in walking, not elsewhere classified: Secondary | ICD-10-CM | POA: Diagnosis not present

## 2022-05-19 DIAGNOSIS — Z741 Need for assistance with personal care: Secondary | ICD-10-CM | POA: Diagnosis not present

## 2022-05-19 DIAGNOSIS — G9341 Metabolic encephalopathy: Secondary | ICD-10-CM | POA: Diagnosis not present

## 2022-05-19 DIAGNOSIS — R1311 Dysphagia, oral phase: Secondary | ICD-10-CM | POA: Diagnosis not present

## 2022-05-21 DIAGNOSIS — M6259 Muscle wasting and atrophy, not elsewhere classified, multiple sites: Secondary | ICD-10-CM | POA: Diagnosis not present

## 2022-05-21 DIAGNOSIS — R262 Difficulty in walking, not elsewhere classified: Secondary | ICD-10-CM | POA: Diagnosis not present

## 2022-05-21 DIAGNOSIS — R531 Weakness: Secondary | ICD-10-CM | POA: Diagnosis not present

## 2022-05-21 DIAGNOSIS — G9341 Metabolic encephalopathy: Secondary | ICD-10-CM | POA: Diagnosis not present

## 2022-05-21 DIAGNOSIS — R1311 Dysphagia, oral phase: Secondary | ICD-10-CM | POA: Diagnosis not present

## 2022-05-21 DIAGNOSIS — Z741 Need for assistance with personal care: Secondary | ICD-10-CM | POA: Diagnosis not present

## 2022-05-21 DIAGNOSIS — R2681 Unsteadiness on feet: Secondary | ICD-10-CM | POA: Diagnosis not present

## 2022-05-22 DIAGNOSIS — R262 Difficulty in walking, not elsewhere classified: Secondary | ICD-10-CM | POA: Diagnosis not present

## 2022-05-22 DIAGNOSIS — M6259 Muscle wasting and atrophy, not elsewhere classified, multiple sites: Secondary | ICD-10-CM | POA: Diagnosis not present

## 2022-05-22 DIAGNOSIS — R2681 Unsteadiness on feet: Secondary | ICD-10-CM | POA: Diagnosis not present

## 2022-05-22 DIAGNOSIS — R1311 Dysphagia, oral phase: Secondary | ICD-10-CM | POA: Diagnosis not present

## 2022-05-22 DIAGNOSIS — G9341 Metabolic encephalopathy: Secondary | ICD-10-CM | POA: Diagnosis not present

## 2022-05-22 DIAGNOSIS — R531 Weakness: Secondary | ICD-10-CM | POA: Diagnosis not present

## 2022-05-22 DIAGNOSIS — Z741 Need for assistance with personal care: Secondary | ICD-10-CM | POA: Diagnosis not present

## 2022-05-23 DIAGNOSIS — R2681 Unsteadiness on feet: Secondary | ICD-10-CM | POA: Diagnosis not present

## 2022-05-23 DIAGNOSIS — Z741 Need for assistance with personal care: Secondary | ICD-10-CM | POA: Diagnosis not present

## 2022-05-23 DIAGNOSIS — G9341 Metabolic encephalopathy: Secondary | ICD-10-CM | POA: Diagnosis not present

## 2022-05-23 DIAGNOSIS — R531 Weakness: Secondary | ICD-10-CM | POA: Diagnosis not present

## 2022-05-23 DIAGNOSIS — Z9181 History of falling: Secondary | ICD-10-CM | POA: Diagnosis not present

## 2022-05-23 DIAGNOSIS — M6250 Muscle wasting and atrophy, not elsewhere classified, unspecified site: Secondary | ICD-10-CM | POA: Diagnosis not present

## 2022-05-23 DIAGNOSIS — R262 Difficulty in walking, not elsewhere classified: Secondary | ICD-10-CM | POA: Diagnosis not present

## 2022-05-23 DIAGNOSIS — M6281 Muscle weakness (generalized): Secondary | ICD-10-CM | POA: Diagnosis not present

## 2022-05-23 DIAGNOSIS — R1311 Dysphagia, oral phase: Secondary | ICD-10-CM | POA: Diagnosis not present

## 2022-05-23 DIAGNOSIS — R42 Dizziness and giddiness: Secondary | ICD-10-CM | POA: Diagnosis not present

## 2022-05-23 DIAGNOSIS — M6259 Muscle wasting and atrophy, not elsewhere classified, multiple sites: Secondary | ICD-10-CM | POA: Diagnosis not present

## 2022-05-24 DIAGNOSIS — R2681 Unsteadiness on feet: Secondary | ICD-10-CM | POA: Diagnosis not present

## 2022-05-24 DIAGNOSIS — M6259 Muscle wasting and atrophy, not elsewhere classified, multiple sites: Secondary | ICD-10-CM | POA: Diagnosis not present

## 2022-05-24 DIAGNOSIS — R1311 Dysphagia, oral phase: Secondary | ICD-10-CM | POA: Diagnosis not present

## 2022-05-24 DIAGNOSIS — G9341 Metabolic encephalopathy: Secondary | ICD-10-CM | POA: Diagnosis not present

## 2022-05-24 DIAGNOSIS — R531 Weakness: Secondary | ICD-10-CM | POA: Diagnosis not present

## 2022-05-24 DIAGNOSIS — R262 Difficulty in walking, not elsewhere classified: Secondary | ICD-10-CM | POA: Diagnosis not present

## 2022-05-24 DIAGNOSIS — Z741 Need for assistance with personal care: Secondary | ICD-10-CM | POA: Diagnosis not present

## 2022-05-25 DIAGNOSIS — G9341 Metabolic encephalopathy: Secondary | ICD-10-CM | POA: Diagnosis not present

## 2022-05-25 DIAGNOSIS — Z741 Need for assistance with personal care: Secondary | ICD-10-CM | POA: Diagnosis not present

## 2022-05-25 DIAGNOSIS — R2681 Unsteadiness on feet: Secondary | ICD-10-CM | POA: Diagnosis not present

## 2022-05-25 DIAGNOSIS — R1311 Dysphagia, oral phase: Secondary | ICD-10-CM | POA: Diagnosis not present

## 2022-05-25 DIAGNOSIS — R262 Difficulty in walking, not elsewhere classified: Secondary | ICD-10-CM | POA: Diagnosis not present

## 2022-05-25 DIAGNOSIS — M6259 Muscle wasting and atrophy, not elsewhere classified, multiple sites: Secondary | ICD-10-CM | POA: Diagnosis not present

## 2022-05-25 DIAGNOSIS — R531 Weakness: Secondary | ICD-10-CM | POA: Diagnosis not present

## 2022-05-26 DIAGNOSIS — M6259 Muscle wasting and atrophy, not elsewhere classified, multiple sites: Secondary | ICD-10-CM | POA: Diagnosis not present

## 2022-05-26 DIAGNOSIS — R262 Difficulty in walking, not elsewhere classified: Secondary | ICD-10-CM | POA: Diagnosis not present

## 2022-05-26 DIAGNOSIS — G9341 Metabolic encephalopathy: Secondary | ICD-10-CM | POA: Diagnosis not present

## 2022-05-26 DIAGNOSIS — R2681 Unsteadiness on feet: Secondary | ICD-10-CM | POA: Diagnosis not present

## 2022-05-26 DIAGNOSIS — R1311 Dysphagia, oral phase: Secondary | ICD-10-CM | POA: Diagnosis not present

## 2022-05-26 DIAGNOSIS — Z741 Need for assistance with personal care: Secondary | ICD-10-CM | POA: Diagnosis not present

## 2022-05-26 DIAGNOSIS — R531 Weakness: Secondary | ICD-10-CM | POA: Diagnosis not present

## 2022-05-29 DIAGNOSIS — R531 Weakness: Secondary | ICD-10-CM | POA: Diagnosis not present

## 2022-05-29 DIAGNOSIS — R0989 Other specified symptoms and signs involving the circulatory and respiratory systems: Secondary | ICD-10-CM | POA: Diagnosis not present

## 2022-05-29 DIAGNOSIS — Z741 Need for assistance with personal care: Secondary | ICD-10-CM | POA: Diagnosis not present

## 2022-05-29 DIAGNOSIS — R2681 Unsteadiness on feet: Secondary | ICD-10-CM | POA: Diagnosis not present

## 2022-05-29 DIAGNOSIS — M6259 Muscle wasting and atrophy, not elsewhere classified, multiple sites: Secondary | ICD-10-CM | POA: Diagnosis not present

## 2022-05-29 DIAGNOSIS — G9341 Metabolic encephalopathy: Secondary | ICD-10-CM | POA: Diagnosis not present

## 2022-05-29 DIAGNOSIS — R1311 Dysphagia, oral phase: Secondary | ICD-10-CM | POA: Diagnosis not present

## 2022-05-29 DIAGNOSIS — J9601 Acute respiratory failure with hypoxia: Secondary | ICD-10-CM | POA: Diagnosis not present

## 2022-05-29 DIAGNOSIS — R262 Difficulty in walking, not elsewhere classified: Secondary | ICD-10-CM | POA: Diagnosis not present

## 2022-05-30 DIAGNOSIS — Z9181 History of falling: Secondary | ICD-10-CM | POA: Diagnosis not present

## 2022-05-30 DIAGNOSIS — M6281 Muscle weakness (generalized): Secondary | ICD-10-CM | POA: Diagnosis not present

## 2022-05-30 DIAGNOSIS — R2681 Unsteadiness on feet: Secondary | ICD-10-CM | POA: Diagnosis not present

## 2022-05-30 DIAGNOSIS — M6259 Muscle wasting and atrophy, not elsewhere classified, multiple sites: Secondary | ICD-10-CM | POA: Diagnosis not present

## 2022-05-30 DIAGNOSIS — Z741 Need for assistance with personal care: Secondary | ICD-10-CM | POA: Diagnosis not present

## 2022-05-30 DIAGNOSIS — G9341 Metabolic encephalopathy: Secondary | ICD-10-CM | POA: Diagnosis not present

## 2022-05-30 DIAGNOSIS — M6250 Muscle wasting and atrophy, not elsewhere classified, unspecified site: Secondary | ICD-10-CM | POA: Diagnosis not present

## 2022-05-30 DIAGNOSIS — R262 Difficulty in walking, not elsewhere classified: Secondary | ICD-10-CM | POA: Diagnosis not present

## 2022-05-30 DIAGNOSIS — R1311 Dysphagia, oral phase: Secondary | ICD-10-CM | POA: Diagnosis not present

## 2022-05-30 DIAGNOSIS — R42 Dizziness and giddiness: Secondary | ICD-10-CM | POA: Diagnosis not present

## 2022-05-30 DIAGNOSIS — R531 Weakness: Secondary | ICD-10-CM | POA: Diagnosis not present

## 2022-05-31 ENCOUNTER — Other Ambulatory Visit: Payer: Self-pay | Admitting: *Deleted

## 2022-05-31 DIAGNOSIS — G9341 Metabolic encephalopathy: Secondary | ICD-10-CM | POA: Diagnosis not present

## 2022-05-31 DIAGNOSIS — R1311 Dysphagia, oral phase: Secondary | ICD-10-CM | POA: Diagnosis not present

## 2022-05-31 DIAGNOSIS — Z741 Need for assistance with personal care: Secondary | ICD-10-CM | POA: Diagnosis not present

## 2022-05-31 DIAGNOSIS — R2681 Unsteadiness on feet: Secondary | ICD-10-CM | POA: Diagnosis not present

## 2022-05-31 DIAGNOSIS — R531 Weakness: Secondary | ICD-10-CM | POA: Diagnosis not present

## 2022-05-31 DIAGNOSIS — R262 Difficulty in walking, not elsewhere classified: Secondary | ICD-10-CM | POA: Diagnosis not present

## 2022-05-31 DIAGNOSIS — M6259 Muscle wasting and atrophy, not elsewhere classified, multiple sites: Secondary | ICD-10-CM | POA: Diagnosis not present

## 2022-05-31 NOTE — Patient Outreach (Signed)
Mrs. Karla Foster resides in [place] [facility] Screening for potential [facility] care coordination services as benefit of health plan and Primary Care Provider.  Met with Advertising copywriter at [facility]. Mrs. Karla Foster is from home with spouse. Ultimately the plan is to return home. Mr. Langelier (spouse) is currently residing in [place] as well. Transition date not known at this time.   Will continue to follow.   [name], MSN, RN,BSN [facility] Post Acute Care Coordinator [phone] (Direct dial)

## 2022-06-01 DIAGNOSIS — R2681 Unsteadiness on feet: Secondary | ICD-10-CM | POA: Diagnosis not present

## 2022-06-01 DIAGNOSIS — R1311 Dysphagia, oral phase: Secondary | ICD-10-CM | POA: Diagnosis not present

## 2022-06-01 DIAGNOSIS — G9341 Metabolic encephalopathy: Secondary | ICD-10-CM | POA: Diagnosis not present

## 2022-06-01 DIAGNOSIS — R262 Difficulty in walking, not elsewhere classified: Secondary | ICD-10-CM | POA: Diagnosis not present

## 2022-06-01 DIAGNOSIS — R531 Weakness: Secondary | ICD-10-CM | POA: Diagnosis not present

## 2022-06-01 DIAGNOSIS — M6259 Muscle wasting and atrophy, not elsewhere classified, multiple sites: Secondary | ICD-10-CM | POA: Diagnosis not present

## 2022-06-01 DIAGNOSIS — Z741 Need for assistance with personal care: Secondary | ICD-10-CM | POA: Diagnosis not present

## 2022-06-02 DIAGNOSIS — R1311 Dysphagia, oral phase: Secondary | ICD-10-CM | POA: Diagnosis not present

## 2022-06-02 DIAGNOSIS — M6259 Muscle wasting and atrophy, not elsewhere classified, multiple sites: Secondary | ICD-10-CM | POA: Diagnosis not present

## 2022-06-02 DIAGNOSIS — R531 Weakness: Secondary | ICD-10-CM | POA: Diagnosis not present

## 2022-06-02 DIAGNOSIS — Z741 Need for assistance with personal care: Secondary | ICD-10-CM | POA: Diagnosis not present

## 2022-06-02 DIAGNOSIS — G9341 Metabolic encephalopathy: Secondary | ICD-10-CM | POA: Diagnosis not present

## 2022-06-02 DIAGNOSIS — R2681 Unsteadiness on feet: Secondary | ICD-10-CM | POA: Diagnosis not present

## 2022-06-02 DIAGNOSIS — R262 Difficulty in walking, not elsewhere classified: Secondary | ICD-10-CM | POA: Diagnosis not present

## 2022-06-04 DIAGNOSIS — M6259 Muscle wasting and atrophy, not elsewhere classified, multiple sites: Secondary | ICD-10-CM | POA: Diagnosis not present

## 2022-06-04 DIAGNOSIS — R1311 Dysphagia, oral phase: Secondary | ICD-10-CM | POA: Diagnosis not present

## 2022-06-04 DIAGNOSIS — R262 Difficulty in walking, not elsewhere classified: Secondary | ICD-10-CM | POA: Diagnosis not present

## 2022-06-04 DIAGNOSIS — Z741 Need for assistance with personal care: Secondary | ICD-10-CM | POA: Diagnosis not present

## 2022-06-04 DIAGNOSIS — R531 Weakness: Secondary | ICD-10-CM | POA: Diagnosis not present

## 2022-06-04 DIAGNOSIS — R2681 Unsteadiness on feet: Secondary | ICD-10-CM | POA: Diagnosis not present

## 2022-06-04 DIAGNOSIS — G9341 Metabolic encephalopathy: Secondary | ICD-10-CM | POA: Diagnosis not present

## 2022-06-05 DIAGNOSIS — R531 Weakness: Secondary | ICD-10-CM | POA: Diagnosis not present

## 2022-06-05 DIAGNOSIS — Z741 Need for assistance with personal care: Secondary | ICD-10-CM | POA: Diagnosis not present

## 2022-06-05 DIAGNOSIS — R1311 Dysphagia, oral phase: Secondary | ICD-10-CM | POA: Diagnosis not present

## 2022-06-05 DIAGNOSIS — R262 Difficulty in walking, not elsewhere classified: Secondary | ICD-10-CM | POA: Diagnosis not present

## 2022-06-05 DIAGNOSIS — Z9181 History of falling: Secondary | ICD-10-CM | POA: Diagnosis not present

## 2022-06-05 DIAGNOSIS — R42 Dizziness and giddiness: Secondary | ICD-10-CM | POA: Diagnosis not present

## 2022-06-05 DIAGNOSIS — M6281 Muscle weakness (generalized): Secondary | ICD-10-CM | POA: Diagnosis not present

## 2022-06-05 DIAGNOSIS — M6250 Muscle wasting and atrophy, not elsewhere classified, unspecified site: Secondary | ICD-10-CM | POA: Diagnosis not present

## 2022-06-05 DIAGNOSIS — M6259 Muscle wasting and atrophy, not elsewhere classified, multiple sites: Secondary | ICD-10-CM | POA: Diagnosis not present

## 2022-06-05 DIAGNOSIS — G9341 Metabolic encephalopathy: Secondary | ICD-10-CM | POA: Diagnosis not present

## 2022-06-05 DIAGNOSIS — R2681 Unsteadiness on feet: Secondary | ICD-10-CM | POA: Diagnosis not present

## 2022-06-06 DIAGNOSIS — R296 Repeated falls: Secondary | ICD-10-CM | POA: Diagnosis not present

## 2022-06-06 DIAGNOSIS — K219 Gastro-esophageal reflux disease without esophagitis: Secondary | ICD-10-CM | POA: Diagnosis not present

## 2022-06-06 DIAGNOSIS — E785 Hyperlipidemia, unspecified: Secondary | ICD-10-CM | POA: Diagnosis not present

## 2022-06-06 DIAGNOSIS — J9601 Acute respiratory failure with hypoxia: Secondary | ICD-10-CM | POA: Diagnosis not present

## 2022-06-07 ENCOUNTER — Other Ambulatory Visit: Payer: Self-pay | Admitting: *Deleted

## 2022-06-07 NOTE — Patient Outreach (Signed)
[facility] Post- Acute Care Coordinator follow up. Karla Foster resides in [place] [facility].  Screening for potential [facility] care coordination services as a benefit of health plan and PCP.  Facility site visit. Met with Sports coach, Electrical engineer. Karla Foster will transition to home today or tomorrow with home health thru [facility] for PT/OT services. PCP appointment for 06/12/22.  Met with Karla Foster at bedside to explain [facility] care coordination follow up. She is agreeable. States she will be returning home alone for now. Her husband is currently in SNF as well. States her sister will be of support and will assist as needed. Karla Foster reports her sister will provide transportation to MD appointments. Karla Foster states she is unsure if she has meals benefit for mobile meals thru [facility]. Writer offered to assist with calling [facility] customer service line. However, Karla Foster could not locate her card. Karla Foster states she will call [facility] customer service once she locates her insurance card.   Will plan to make referral for [facility] RN for care coordination upon SNF discharge.   [name], MSN, RN,BSN [facility] Post Acute Care Coordinator [phone] (Direct dial)

## 2022-06-08 ENCOUNTER — Inpatient Hospital Stay: Payer: 59 | Admitting: Family Medicine

## 2022-06-11 ENCOUNTER — Other Ambulatory Visit: Payer: Self-pay | Admitting: *Deleted

## 2022-06-11 ENCOUNTER — Telehealth: Payer: Self-pay | Admitting: *Deleted

## 2022-06-11 DIAGNOSIS — J449 Chronic obstructive pulmonary disease, unspecified: Secondary | ICD-10-CM | POA: Diagnosis not present

## 2022-06-11 DIAGNOSIS — I10 Essential (primary) hypertension: Secondary | ICD-10-CM

## 2022-06-11 DIAGNOSIS — G9341 Metabolic encephalopathy: Secondary | ICD-10-CM | POA: Diagnosis not present

## 2022-06-11 NOTE — Patient Outreach (Addendum)
[facility] Post- Acute Care Coordinator follow up.  Screening for potential [facility] care coordination services as a benefit of health plan and PCP.  Per [facility] Karla Foster discharged from [facility] on 06/08/22.   Per SNF social worker Karla Foster will have [facility] home health. PCP appt scheduled for 06/12/22. Please see writer's notes from 06/07/22. Karla Foster was agreeable to [facility] follow up at that time.   Will refer for RN [facility] care coordination services.   [name], MSN, RN,BSN [facility] Post Acute Care Coordinator [phone] (Direct dial)

## 2022-06-11 NOTE — Progress Notes (Signed)
  Care Coordination  Outreach Note  06/11/2022 Name: Karla Foster MRN: [id number] DOB: [birth year]   Care Coordination Outreach Attempts: An unsuccessful telephone outreach was attempted today to offer the patient information about available care coordination services as a benefit of their health plan.   Follow Up Plan:  Additional outreach attempts will be made to offer the patient care coordination information and services.   Encounter Outcome:  No Answer  Burman Nieves, CCMA Care Coordination Care Guide Direct Dial: [phone]

## 2022-06-12 ENCOUNTER — Inpatient Hospital Stay: Payer: 59 | Admitting: Family Medicine

## 2022-06-12 DIAGNOSIS — G43909 Migraine, unspecified, not intractable, without status migrainosus: Secondary | ICD-10-CM | POA: Diagnosis not present

## 2022-06-12 DIAGNOSIS — Z556 Problems related to health literacy: Secondary | ICD-10-CM | POA: Diagnosis not present

## 2022-06-12 DIAGNOSIS — R1310 Dysphagia, unspecified: Secondary | ICD-10-CM | POA: Diagnosis not present

## 2022-06-12 DIAGNOSIS — G629 Polyneuropathy, unspecified: Secondary | ICD-10-CM | POA: Diagnosis not present

## 2022-06-12 DIAGNOSIS — I10 Essential (primary) hypertension: Secondary | ICD-10-CM | POA: Diagnosis not present

## 2022-06-12 DIAGNOSIS — Z9181 History of falling: Secondary | ICD-10-CM | POA: Diagnosis not present

## 2022-06-12 DIAGNOSIS — M1990 Unspecified osteoarthritis, unspecified site: Secondary | ICD-10-CM | POA: Diagnosis not present

## 2022-06-12 DIAGNOSIS — Z9981 Dependence on supplemental oxygen: Secondary | ICD-10-CM | POA: Diagnosis not present

## 2022-06-12 DIAGNOSIS — K219 Gastro-esophageal reflux disease without esophagitis: Secondary | ICD-10-CM | POA: Diagnosis not present

## 2022-06-12 DIAGNOSIS — R531 Weakness: Secondary | ICD-10-CM | POA: Diagnosis not present

## 2022-06-12 DIAGNOSIS — E785 Hyperlipidemia, unspecified: Secondary | ICD-10-CM | POA: Diagnosis not present

## 2022-06-12 DIAGNOSIS — G9341 Metabolic encephalopathy: Secondary | ICD-10-CM | POA: Diagnosis not present

## 2022-06-12 DIAGNOSIS — R0902 Hypoxemia: Secondary | ICD-10-CM | POA: Diagnosis not present

## 2022-06-12 DIAGNOSIS — M6259 Muscle wasting and atrophy, not elsewhere classified, multiple sites: Secondary | ICD-10-CM | POA: Diagnosis not present

## 2022-06-12 NOTE — Progress Notes (Deleted)
   Karla Foster is a 67 y.o. female who presents today for an office visit.  Assessment/Plan:  New/Acute Problems: ***  Chronic Problems Addressed Today: No problem-specific Assessment & Plan notes found for this encounter.     Subjective:  HPI:  Patient here today for hospitalization and skilled nursing facility follow-up.  She presented to the ED on 04/27/2022 with weakness.  Workup in the ED was initially negative however she was not able to ambulate and was admitted for further evaluation.  Neurology was consulted who thought that her low B12 level was contributing.  Her MRI was negative for any acute findings.  OT and PT were consulted and recommended discharge to skilled nursing facility.       Objective:  Physical Exam: There were no vitals taken for this visit.  Gen: No acute distress, resting comfortably*** CV: Regular rate and rhythm with no murmurs appreciated Pulm: Normal work of breathing, clear to auscultation bilaterally with no crackles, wheezes, or rhonchi Neuro: Grossly normal, moves all extremities Psych: Normal affect and thought content      Karla Foster M. Jimmey Ralph, MD 06/12/2022 9:07 AM

## 2022-06-14 DIAGNOSIS — Z9981 Dependence on supplemental oxygen: Secondary | ICD-10-CM | POA: Diagnosis not present

## 2022-06-14 DIAGNOSIS — M1990 Unspecified osteoarthritis, unspecified site: Secondary | ICD-10-CM | POA: Diagnosis not present

## 2022-06-14 DIAGNOSIS — R0902 Hypoxemia: Secondary | ICD-10-CM | POA: Diagnosis not present

## 2022-06-14 DIAGNOSIS — Z9181 History of falling: Secondary | ICD-10-CM | POA: Diagnosis not present

## 2022-06-14 DIAGNOSIS — G629 Polyneuropathy, unspecified: Secondary | ICD-10-CM | POA: Diagnosis not present

## 2022-06-14 DIAGNOSIS — G43909 Migraine, unspecified, not intractable, without status migrainosus: Secondary | ICD-10-CM | POA: Diagnosis not present

## 2022-06-14 DIAGNOSIS — E785 Hyperlipidemia, unspecified: Secondary | ICD-10-CM | POA: Diagnosis not present

## 2022-06-14 DIAGNOSIS — K219 Gastro-esophageal reflux disease without esophagitis: Secondary | ICD-10-CM | POA: Diagnosis not present

## 2022-06-14 DIAGNOSIS — R531 Weakness: Secondary | ICD-10-CM | POA: Diagnosis not present

## 2022-06-14 DIAGNOSIS — I10 Essential (primary) hypertension: Secondary | ICD-10-CM | POA: Diagnosis not present

## 2022-06-14 DIAGNOSIS — G9341 Metabolic encephalopathy: Secondary | ICD-10-CM | POA: Diagnosis not present

## 2022-06-14 DIAGNOSIS — Z556 Problems related to health literacy: Secondary | ICD-10-CM | POA: Diagnosis not present

## 2022-06-14 DIAGNOSIS — M6259 Muscle wasting and atrophy, not elsewhere classified, multiple sites: Secondary | ICD-10-CM | POA: Diagnosis not present

## 2022-06-14 DIAGNOSIS — R1310 Dysphagia, unspecified: Secondary | ICD-10-CM | POA: Diagnosis not present

## 2022-06-14 NOTE — Progress Notes (Signed)
  Care Coordination  Outreach Note  06/14/2022 Name: Karla Foster MRN: [id number] DOB: Jul 13, 1955   Care Coordination Outreach Attempts: A second unsuccessful outreach was attempted today to offer the patient with information about available care coordination services as a benefit of their health plan.     Follow Up Plan:  Additional outreach attempts will be made to offer the patient care coordination information and services.   Encounter Outcome:  No Answer  Burman Nieves, CCMA Care Coordination Care Guide Direct Dial: [phone]

## 2022-06-15 DIAGNOSIS — K219 Gastro-esophageal reflux disease without esophagitis: Secondary | ICD-10-CM | POA: Diagnosis not present

## 2022-06-15 DIAGNOSIS — R0902 Hypoxemia: Secondary | ICD-10-CM | POA: Diagnosis not present

## 2022-06-15 DIAGNOSIS — G43909 Migraine, unspecified, not intractable, without status migrainosus: Secondary | ICD-10-CM | POA: Diagnosis not present

## 2022-06-15 DIAGNOSIS — E785 Hyperlipidemia, unspecified: Secondary | ICD-10-CM | POA: Diagnosis not present

## 2022-06-15 DIAGNOSIS — R1310 Dysphagia, unspecified: Secondary | ICD-10-CM | POA: Diagnosis not present

## 2022-06-15 DIAGNOSIS — M6259 Muscle wasting and atrophy, not elsewhere classified, multiple sites: Secondary | ICD-10-CM | POA: Diagnosis not present

## 2022-06-15 DIAGNOSIS — Z9181 History of falling: Secondary | ICD-10-CM | POA: Diagnosis not present

## 2022-06-15 DIAGNOSIS — M1990 Unspecified osteoarthritis, unspecified site: Secondary | ICD-10-CM | POA: Diagnosis not present

## 2022-06-15 DIAGNOSIS — G629 Polyneuropathy, unspecified: Secondary | ICD-10-CM | POA: Diagnosis not present

## 2022-06-15 DIAGNOSIS — G9341 Metabolic encephalopathy: Secondary | ICD-10-CM | POA: Diagnosis not present

## 2022-06-15 DIAGNOSIS — Z9981 Dependence on supplemental oxygen: Secondary | ICD-10-CM | POA: Diagnosis not present

## 2022-06-15 DIAGNOSIS — Z556 Problems related to health literacy: Secondary | ICD-10-CM | POA: Diagnosis not present

## 2022-06-15 DIAGNOSIS — R531 Weakness: Secondary | ICD-10-CM | POA: Diagnosis not present

## 2022-06-15 DIAGNOSIS — I10 Essential (primary) hypertension: Secondary | ICD-10-CM | POA: Diagnosis not present

## 2022-06-18 ENCOUNTER — Telehealth: Payer: Self-pay | Admitting: Family Medicine

## 2022-06-18 DIAGNOSIS — Z9181 History of falling: Secondary | ICD-10-CM | POA: Diagnosis not present

## 2022-06-18 DIAGNOSIS — G9341 Metabolic encephalopathy: Secondary | ICD-10-CM | POA: Diagnosis not present

## 2022-06-18 DIAGNOSIS — I10 Essential (primary) hypertension: Secondary | ICD-10-CM | POA: Diagnosis not present

## 2022-06-18 DIAGNOSIS — M6259 Muscle wasting and atrophy, not elsewhere classified, multiple sites: Secondary | ICD-10-CM | POA: Diagnosis not present

## 2022-06-18 DIAGNOSIS — R1310 Dysphagia, unspecified: Secondary | ICD-10-CM | POA: Diagnosis not present

## 2022-06-18 DIAGNOSIS — Z556 Problems related to health literacy: Secondary | ICD-10-CM | POA: Diagnosis not present

## 2022-06-18 DIAGNOSIS — M1990 Unspecified osteoarthritis, unspecified site: Secondary | ICD-10-CM | POA: Diagnosis not present

## 2022-06-18 DIAGNOSIS — G629 Polyneuropathy, unspecified: Secondary | ICD-10-CM | POA: Diagnosis not present

## 2022-06-18 DIAGNOSIS — R531 Weakness: Secondary | ICD-10-CM | POA: Diagnosis not present

## 2022-06-18 DIAGNOSIS — K219 Gastro-esophageal reflux disease without esophagitis: Secondary | ICD-10-CM | POA: Diagnosis not present

## 2022-06-18 DIAGNOSIS — G43909 Migraine, unspecified, not intractable, without status migrainosus: Secondary | ICD-10-CM | POA: Diagnosis not present

## 2022-06-18 DIAGNOSIS — Z9981 Dependence on supplemental oxygen: Secondary | ICD-10-CM | POA: Diagnosis not present

## 2022-06-18 DIAGNOSIS — R0902 Hypoxemia: Secondary | ICD-10-CM | POA: Diagnosis not present

## 2022-06-18 DIAGNOSIS — E785 Hyperlipidemia, unspecified: Secondary | ICD-10-CM | POA: Diagnosis not present

## 2022-06-18 NOTE — Telephone Encounter (Signed)
[facility] faxed document Home Health Certificate (Order ID [id number]), to be filled out by provider. Patient requested to send it via Fax [phone]. Document is located in providers tray at front office.

## 2022-06-18 NOTE — Telephone Encounter (Signed)
Caller states they found out patient's oxygen concentrator was broken. Following this they put her on a portable oxygen tank which improved O2 levels from 84 to 95. Caller states [facility] [employer] is coming out to fix the concentrator.

## 2022-06-18 NOTE — Progress Notes (Signed)
  Care Coordination  Outreach Note  06/18/2022 Name: Karla Foster MRN: [id number] DOB: Oct 16, 1955   Care Coordination Outreach Attempts: A third unsuccessful outreach was attempted today to offer the patient with information about available care coordination services as a benefit of their health plan.   Follow Up Plan:  No further outreach attempts will be made at this time. We have been unable to contact the patient to offer or enroll patient in care coordination services  Encounter Outcome:  No Answer  Burman Nieves, [facility] Care Coordination Care Guide Direct Dial: [phone]

## 2022-06-18 NOTE — Telephone Encounter (Signed)
Noted. Can we please have her schedule an appointment here? She has not been here for over a year.  Karla Foster. Jimmey Ralph, MD 06/18/2022 3:08 PM

## 2022-06-18 NOTE — Telephone Encounter (Signed)
Home Health Verbal Orders  Agency:  [facility]  Caller: Ladona Ridgel   Reason for Request:  Oxygen has been fluctuating from 82-93, staying more in the 80 even though she is on oxygen - blurred vision  What should be done, is anything   [phone]

## 2022-06-19 DIAGNOSIS — K219 Gastro-esophageal reflux disease without esophagitis: Secondary | ICD-10-CM | POA: Diagnosis not present

## 2022-06-19 DIAGNOSIS — Z9981 Dependence on supplemental oxygen: Secondary | ICD-10-CM | POA: Diagnosis not present

## 2022-06-19 DIAGNOSIS — Z556 Problems related to health literacy: Secondary | ICD-10-CM | POA: Diagnosis not present

## 2022-06-19 DIAGNOSIS — G629 Polyneuropathy, unspecified: Secondary | ICD-10-CM | POA: Diagnosis not present

## 2022-06-19 DIAGNOSIS — R1310 Dysphagia, unspecified: Secondary | ICD-10-CM | POA: Diagnosis not present

## 2022-06-19 DIAGNOSIS — Z9181 History of falling: Secondary | ICD-10-CM | POA: Diagnosis not present

## 2022-06-19 DIAGNOSIS — E785 Hyperlipidemia, unspecified: Secondary | ICD-10-CM | POA: Diagnosis not present

## 2022-06-19 DIAGNOSIS — M1990 Unspecified osteoarthritis, unspecified site: Secondary | ICD-10-CM | POA: Diagnosis not present

## 2022-06-19 DIAGNOSIS — I10 Essential (primary) hypertension: Secondary | ICD-10-CM | POA: Diagnosis not present

## 2022-06-19 DIAGNOSIS — G43909 Migraine, unspecified, not intractable, without status migrainosus: Secondary | ICD-10-CM | POA: Diagnosis not present

## 2022-06-19 DIAGNOSIS — R0902 Hypoxemia: Secondary | ICD-10-CM | POA: Diagnosis not present

## 2022-06-19 DIAGNOSIS — G9341 Metabolic encephalopathy: Secondary | ICD-10-CM | POA: Diagnosis not present

## 2022-06-19 DIAGNOSIS — M6259 Muscle wasting and atrophy, not elsewhere classified, multiple sites: Secondary | ICD-10-CM | POA: Diagnosis not present

## 2022-06-19 DIAGNOSIS — R531 Weakness: Secondary | ICD-10-CM | POA: Diagnosis not present

## 2022-06-19 NOTE — Telephone Encounter (Signed)
Patient need OV 

## 2022-06-19 NOTE — Telephone Encounter (Signed)
See last note. She needs office visit as we have not seen her in a year.  Katina Degree. Jimmey Ralph, MD 06/19/2022 7:31 AM

## 2022-06-19 NOTE — Telephone Encounter (Signed)
Placed in PCP sign folder.

## 2022-06-19 NOTE — Telephone Encounter (Signed)
Called pt and  scheduled for Friday 06/22/2022 @ 1:20pm

## 2022-06-20 ENCOUNTER — Telehealth: Payer: Self-pay | Admitting: *Deleted

## 2022-06-20 ENCOUNTER — Telehealth: Payer: Self-pay | Admitting: Family Medicine

## 2022-06-20 NOTE — Telephone Encounter (Signed)
Home Health Verbal Orders  Agency:  [facility]   Caller: Ashok Cordia [phone]   Contact and title  Requesting OT/ PT/ Skilled nursing/ Social Work/ Speech:  social work   Reason for Request:  verbal order   Frequency:  1 time this week  HH needs F2F w/in last 30 days

## 2022-06-20 NOTE — Telephone Encounter (Signed)
Ok to give VO? 

## 2022-06-20 NOTE — Telephone Encounter (Signed)
[facility] form was faxed [phone] on 06/19/2022 Form placed to be scan in patient chart

## 2022-06-21 DIAGNOSIS — Z556 Problems related to health literacy: Secondary | ICD-10-CM | POA: Diagnosis not present

## 2022-06-21 DIAGNOSIS — R1310 Dysphagia, unspecified: Secondary | ICD-10-CM | POA: Diagnosis not present

## 2022-06-21 DIAGNOSIS — R0902 Hypoxemia: Secondary | ICD-10-CM | POA: Diagnosis not present

## 2022-06-21 DIAGNOSIS — G629 Polyneuropathy, unspecified: Secondary | ICD-10-CM | POA: Diagnosis not present

## 2022-06-21 DIAGNOSIS — K219 Gastro-esophageal reflux disease without esophagitis: Secondary | ICD-10-CM | POA: Diagnosis not present

## 2022-06-21 DIAGNOSIS — I10 Essential (primary) hypertension: Secondary | ICD-10-CM | POA: Diagnosis not present

## 2022-06-21 DIAGNOSIS — M1990 Unspecified osteoarthritis, unspecified site: Secondary | ICD-10-CM | POA: Diagnosis not present

## 2022-06-21 DIAGNOSIS — R531 Weakness: Secondary | ICD-10-CM | POA: Diagnosis not present

## 2022-06-21 DIAGNOSIS — Z9181 History of falling: Secondary | ICD-10-CM | POA: Diagnosis not present

## 2022-06-21 DIAGNOSIS — M6259 Muscle wasting and atrophy, not elsewhere classified, multiple sites: Secondary | ICD-10-CM | POA: Diagnosis not present

## 2022-06-21 DIAGNOSIS — G9341 Metabolic encephalopathy: Secondary | ICD-10-CM | POA: Diagnosis not present

## 2022-06-21 DIAGNOSIS — G43909 Migraine, unspecified, not intractable, without status migrainosus: Secondary | ICD-10-CM | POA: Diagnosis not present

## 2022-06-21 DIAGNOSIS — E785 Hyperlipidemia, unspecified: Secondary | ICD-10-CM | POA: Diagnosis not present

## 2022-06-21 DIAGNOSIS — Z9981 Dependence on supplemental oxygen: Secondary | ICD-10-CM | POA: Diagnosis not present

## 2022-06-21 NOTE — Telephone Encounter (Signed)
Called Marissa [phone]  VO Left on Voice mail

## 2022-06-21 NOTE — Telephone Encounter (Signed)
Ok with me. Please place any necessary orders.  Please make sure has an appointment with [place] soon.  Katina Degree. Jimmey Ralph, MD 06/21/2022 1:08 PM

## 2022-06-22 ENCOUNTER — Ambulatory Visit: Payer: 59 | Admitting: Family Medicine

## 2022-06-22 DIAGNOSIS — M1990 Unspecified osteoarthritis, unspecified site: Secondary | ICD-10-CM | POA: Diagnosis not present

## 2022-06-22 DIAGNOSIS — K219 Gastro-esophageal reflux disease without esophagitis: Secondary | ICD-10-CM | POA: Diagnosis not present

## 2022-06-22 DIAGNOSIS — R531 Weakness: Secondary | ICD-10-CM | POA: Diagnosis not present

## 2022-06-22 DIAGNOSIS — Z9981 Dependence on supplemental oxygen: Secondary | ICD-10-CM | POA: Diagnosis not present

## 2022-06-22 DIAGNOSIS — M6259 Muscle wasting and atrophy, not elsewhere classified, multiple sites: Secondary | ICD-10-CM | POA: Diagnosis not present

## 2022-06-22 DIAGNOSIS — I10 Essential (primary) hypertension: Secondary | ICD-10-CM | POA: Diagnosis not present

## 2022-06-22 DIAGNOSIS — Z9181 History of falling: Secondary | ICD-10-CM | POA: Diagnosis not present

## 2022-06-22 DIAGNOSIS — G629 Polyneuropathy, unspecified: Secondary | ICD-10-CM | POA: Diagnosis not present

## 2022-06-22 DIAGNOSIS — R1310 Dysphagia, unspecified: Secondary | ICD-10-CM | POA: Diagnosis not present

## 2022-06-22 DIAGNOSIS — G43909 Migraine, unspecified, not intractable, without status migrainosus: Secondary | ICD-10-CM | POA: Diagnosis not present

## 2022-06-22 DIAGNOSIS — Z556 Problems related to health literacy: Secondary | ICD-10-CM | POA: Diagnosis not present

## 2022-06-22 DIAGNOSIS — R0902 Hypoxemia: Secondary | ICD-10-CM | POA: Diagnosis not present

## 2022-06-22 DIAGNOSIS — E785 Hyperlipidemia, unspecified: Secondary | ICD-10-CM | POA: Diagnosis not present

## 2022-06-22 DIAGNOSIS — G9341 Metabolic encephalopathy: Secondary | ICD-10-CM | POA: Diagnosis not present

## 2022-06-25 DIAGNOSIS — M1990 Unspecified osteoarthritis, unspecified site: Secondary | ICD-10-CM | POA: Diagnosis not present

## 2022-06-25 DIAGNOSIS — R1310 Dysphagia, unspecified: Secondary | ICD-10-CM | POA: Diagnosis not present

## 2022-06-25 DIAGNOSIS — Z9181 History of falling: Secondary | ICD-10-CM | POA: Diagnosis not present

## 2022-06-25 DIAGNOSIS — M6259 Muscle wasting and atrophy, not elsewhere classified, multiple sites: Secondary | ICD-10-CM | POA: Diagnosis not present

## 2022-06-25 DIAGNOSIS — G9341 Metabolic encephalopathy: Secondary | ICD-10-CM | POA: Diagnosis not present

## 2022-06-25 DIAGNOSIS — I10 Essential (primary) hypertension: Secondary | ICD-10-CM | POA: Diagnosis not present

## 2022-06-25 DIAGNOSIS — Z9981 Dependence on supplemental oxygen: Secondary | ICD-10-CM | POA: Diagnosis not present

## 2022-06-25 DIAGNOSIS — E785 Hyperlipidemia, unspecified: Secondary | ICD-10-CM | POA: Diagnosis not present

## 2022-06-25 DIAGNOSIS — Z556 Problems related to health literacy: Secondary | ICD-10-CM | POA: Diagnosis not present

## 2022-06-25 DIAGNOSIS — G43909 Migraine, unspecified, not intractable, without status migrainosus: Secondary | ICD-10-CM | POA: Diagnosis not present

## 2022-06-25 DIAGNOSIS — K219 Gastro-esophageal reflux disease without esophagitis: Secondary | ICD-10-CM | POA: Diagnosis not present

## 2022-06-25 DIAGNOSIS — R0902 Hypoxemia: Secondary | ICD-10-CM | POA: Diagnosis not present

## 2022-06-25 DIAGNOSIS — R531 Weakness: Secondary | ICD-10-CM | POA: Diagnosis not present

## 2022-06-25 DIAGNOSIS — G629 Polyneuropathy, unspecified: Secondary | ICD-10-CM | POA: Diagnosis not present

## 2022-06-26 ENCOUNTER — Telehealth: Payer: Self-pay | Admitting: *Deleted

## 2022-06-26 ENCOUNTER — Ambulatory Visit (INDEPENDENT_AMBULATORY_CARE_PROVIDER_SITE_OTHER): Payer: 59 | Admitting: Family Medicine

## 2022-06-26 VITALS — BP 117/72 | HR 87 | Temp 97.5°F | Ht 67.0 in | Wt 180.0 lb

## 2022-06-26 DIAGNOSIS — R251 Tremor, unspecified: Secondary | ICD-10-CM | POA: Diagnosis not present

## 2022-06-26 DIAGNOSIS — R0902 Hypoxemia: Secondary | ICD-10-CM | POA: Diagnosis not present

## 2022-06-26 DIAGNOSIS — R531 Weakness: Secondary | ICD-10-CM | POA: Diagnosis not present

## 2022-06-26 DIAGNOSIS — S32010D Wedge compression fracture of first lumbar vertebra, subsequent encounter for fracture with routine healing: Secondary | ICD-10-CM | POA: Diagnosis not present

## 2022-06-26 DIAGNOSIS — G43909 Migraine, unspecified, not intractable, without status migrainosus: Secondary | ICD-10-CM | POA: Diagnosis not present

## 2022-06-26 DIAGNOSIS — M1990 Unspecified osteoarthritis, unspecified site: Secondary | ICD-10-CM | POA: Diagnosis not present

## 2022-06-26 DIAGNOSIS — R42 Dizziness and giddiness: Secondary | ICD-10-CM | POA: Diagnosis not present

## 2022-06-26 DIAGNOSIS — F419 Anxiety disorder, unspecified: Secondary | ICD-10-CM | POA: Diagnosis not present

## 2022-06-26 DIAGNOSIS — G629 Polyneuropathy, unspecified: Secondary | ICD-10-CM | POA: Diagnosis not present

## 2022-06-26 DIAGNOSIS — R1310 Dysphagia, unspecified: Secondary | ICD-10-CM | POA: Diagnosis not present

## 2022-06-26 DIAGNOSIS — J449 Chronic obstructive pulmonary disease, unspecified: Secondary | ICD-10-CM | POA: Diagnosis not present

## 2022-06-26 DIAGNOSIS — Z9981 Dependence on supplemental oxygen: Secondary | ICD-10-CM | POA: Diagnosis not present

## 2022-06-26 DIAGNOSIS — Z9181 History of falling: Secondary | ICD-10-CM | POA: Diagnosis not present

## 2022-06-26 DIAGNOSIS — Z556 Problems related to health literacy: Secondary | ICD-10-CM | POA: Diagnosis not present

## 2022-06-26 DIAGNOSIS — F325 Major depressive disorder, single episode, in full remission: Secondary | ICD-10-CM

## 2022-06-26 DIAGNOSIS — E785 Hyperlipidemia, unspecified: Secondary | ICD-10-CM | POA: Diagnosis not present

## 2022-06-26 DIAGNOSIS — K219 Gastro-esophageal reflux disease without esophagitis: Secondary | ICD-10-CM | POA: Diagnosis not present

## 2022-06-26 DIAGNOSIS — G9341 Metabolic encephalopathy: Secondary | ICD-10-CM | POA: Diagnosis not present

## 2022-06-26 DIAGNOSIS — M6259 Muscle wasting and atrophy, not elsewhere classified, multiple sites: Secondary | ICD-10-CM | POA: Diagnosis not present

## 2022-06-26 DIAGNOSIS — I10 Essential (primary) hypertension: Secondary | ICD-10-CM | POA: Diagnosis not present

## 2022-06-26 LAB — CBC
HCT: 44.1 % (ref 36.0–46.0)
Hemoglobin: 14.2 g/dL (ref 12.0–15.0)
MCHC: 32.3 g/dL (ref 30.0–36.0)
MCV: 99.8 fl (ref 78.0–100.0)
Platelets: 323 10*3/uL (ref 150.0–400.0)
RBC: 4.42 Mil/uL (ref 3.87–5.11)
RDW: 15.6 % — ABNORMAL HIGH (ref 11.5–15.5)
WBC: 11.7 10*3/uL — ABNORMAL HIGH (ref 4.0–10.5)

## 2022-06-26 LAB — LIPID PANEL
Cholesterol: 177 mg/dL (ref 0–200)
HDL: 61.8 mg/dL (ref >39.00)
LDL Cholesterol: 94 mg/dL (ref 0–99)
NonHDL: 115.01
Total CHOL/HDL Ratio: 3
Triglycerides: 106 mg/dL (ref 0.0–149.0)
VLDL: 21.2 mg/dL (ref 0.0–40.0)

## 2022-06-26 LAB — COMPREHENSIVE METABOLIC PANEL
ALT: 9 U/L (ref 0–35)
AST: 19 U/L (ref 0–37)
Albumin: 4 g/dL (ref 3.5–5.2)
Alkaline Phosphatase: 128 U/L — ABNORMAL HIGH (ref 39–117)
BUN: 12 mg/dL (ref 6–23)
CO2: 27 mEq/L (ref 19–32)
Calcium: 9.6 mg/dL (ref 8.4–10.5)
Chloride: 98 mEq/L (ref 96–112)
Creatinine, Ser: 0.74 mg/dL (ref 0.40–1.20)
GFR: 83.83 mL/min (ref >60.00)
Glucose, Bld: 82 mg/dL (ref 70–99)
Potassium: 3.4 mEq/L — ABNORMAL LOW (ref 3.5–5.1)
Sodium: 138 mEq/L (ref 135–145)
Total Bilirubin: 0.3 mg/dL (ref 0.2–1.2)
Total Protein: 7 g/dL (ref 6.0–8.3)

## 2022-06-26 LAB — VITAMIN B12: Vitamin B-12: 418 pg/mL (ref 211–911)

## 2022-06-26 LAB — FOLATE: Folate: 21.2 ng/mL (ref >5.9)

## 2022-06-26 LAB — HEMOGLOBIN A1C: Hgb A1c MFr Bld: 5.1 % (ref 4.6–6.5)

## 2022-06-26 LAB — MAGNESIUM: Magnesium: 1.7 mg/dL (ref 1.5–2.5)

## 2022-06-26 LAB — TSH: TSH: 4.63 u[IU]/mL (ref 0.35–5.50)

## 2022-06-26 MED ORDER — DULOXETINE HCL 60 MG PO CPEP: 60.0000 mg | ORAL_CAPSULE | Freq: Every day | ORAL | 3 refills | Status: DC

## 2022-06-26 MED ORDER — ATORVASTATIN CALCIUM 40 MG PO TABS: 40.0000 mg | ORAL_TABLET | Freq: Every day | ORAL | 3 refills | Status: DC

## 2022-06-26 MED ORDER — CLONAZEPAM 1 MG PO TABS: 1.0000 mg | ORAL_TABLET | Freq: Two times a day (BID) | ORAL | 5 refills | Status: DC | PRN

## 2022-06-26 MED ORDER — GABAPENTIN 300 MG PO CAPS: 300.0000 mg | ORAL_CAPSULE | Freq: Three times a day (TID) | ORAL | 3 refills | Status: DC

## 2022-06-26 MED ORDER — ESOMEPRAZOLE MAGNESIUM 40 MG PO CPDR: 40.0000 mg | DELAYED_RELEASE_CAPSULE | Freq: Every day | ORAL | 3 refills | Status: DC

## 2022-06-26 MED ORDER — TRAZODONE HCL 300 MG PO TABS: 300.0000 mg | ORAL_TABLET | Freq: Every day | ORAL | 3 refills | Status: DC

## 2022-06-26 NOTE — Assessment & Plan Note (Signed)
Overall stable.  No recent flares.  Uses albuterol as needed.  Will refill today.

## 2022-06-26 NOTE — Assessment & Plan Note (Signed)
On gabapentin 300 mg 3 times daily.  Will refill today.

## 2022-06-26 NOTE — Assessment & Plan Note (Signed)
Not currently on any medications.  Check lipids today.

## 2022-06-26 NOTE — Telephone Encounter (Signed)
[facility] Faxed to [phone] Form placed to be scan in pt chart

## 2022-06-26 NOTE — Assessment & Plan Note (Signed)
Stable on Cymbalta 60 mg daily and trazodone 300 mg daily. °

## 2022-06-26 NOTE — Assessment & Plan Note (Signed)
Patient with resting tremor in bilateral hands and head.  She apparently was diagnosed with Parkinson disease at some point in the past however I do not see any official diagnosis of this or neurology evaluation.  We will refer to neurology for further evaluation and management.  Will be checking labs today as above including CBC, c-Met, B12, and TSH.
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
## 2022-06-26 NOTE — Assessment & Plan Note (Signed)
Stable on Cymbalta 60 mg daily, trazodone 300 mg nightly and clonazepam as needed.  Will refill today.

## 2022-06-26 NOTE — Assessment & Plan Note (Addendum)
Symptoms are still persistent.  No significant change the last couple of months.  She did take B12 replacement at her SNF apparently however she does not think this is had any difference with her symptoms.   will recheck labs today including B12, CBC, c-Met, and TSH.  Imaging has been negative.  She is working on getting into a SNF.  Will be placing referral to neurology for her tremor as below.  She has been working with home health physical therapy and occupational therapy.

## 2022-06-26 NOTE — Patient Instructions (Signed)
It was very nice to see you today!  We will check blood work today.  I will refer you to see the neurologist.  Return in about 3 months (around 09/26/2022).   Take care, Dr Jimmey Ralph  PLEASE NOTE:  If you had any lab tests, please let us know if you have not heard back within a few days. You may see your results on mychart before we have a chance to review them but we will give you a call once they are reviewed by [place].   If we ordered any referrals today, please let us know if you have not heard from their office within the next week.   If you had any urgent prescriptions sent in today, please check with the pharmacy within an hour of our visit to make sure the prescription was transmitted appropriately.   Please try these tips to maintain a healthy lifestyle:  Eat at least 3 REAL meals and 1-2 snacks per day.  Aim for no more than 5 hours between eating.  If you eat breakfast, please do so within one hour of getting up.   Each meal should contain half fruits/vegetables, one quarter protein, and one quarter carbs (no bigger than a computer mouse)  Cut down on sweet beverages. This includes juice, soda, and sweet tea.   Drink at least 1 glass of water with each meal and aim for at least 8 glasses per day  Exercise at least 150 minutes every week.

## 2022-06-27 ENCOUNTER — Other Ambulatory Visit: Payer: Self-pay

## 2022-06-27 DIAGNOSIS — R748 Abnormal levels of other serum enzymes: Secondary | ICD-10-CM

## 2022-06-27 NOTE — Progress Notes (Signed)
Her white blood cell count is slightly elevated.  This is about the same where it has been the last few months.  One of her liver numbers was slightly elevated.  Please have her come back to recheck c-Met and ggt. please place future order for these.  The rest of her labs are all stable.  She should continue her B12 and folate supplementation.

## 2022-06-29 ENCOUNTER — Other Ambulatory Visit (INDEPENDENT_AMBULATORY_CARE_PROVIDER_SITE_OTHER): Payer: 59

## 2022-06-29 DIAGNOSIS — R748 Abnormal levels of other serum enzymes: Secondary | ICD-10-CM

## 2022-06-29 LAB — COMPREHENSIVE METABOLIC PANEL
ALT: 8 U/L (ref 0–35)
AST: 18 U/L (ref 0–37)
Albumin: 3.7 g/dL (ref 3.5–5.2)
Alkaline Phosphatase: 122 U/L — ABNORMAL HIGH (ref 39–117)
BUN: 10 mg/dL (ref 6–23)
CO2: 29 mEq/L (ref 19–32)
Calcium: 9.2 mg/dL (ref 8.4–10.5)
Chloride: 101 mEq/L (ref 96–112)
Creatinine, Ser: 0.77 mg/dL (ref 0.40–1.20)
GFR: 79.92 mL/min (ref >60.00)
Glucose, Bld: 106 mg/dL — ABNORMAL HIGH (ref 70–99)
Potassium: 4.1 mEq/L (ref 3.5–5.1)
Sodium: 140 mEq/L (ref 135–145)
Total Bilirubin: 0.4 mg/dL (ref 0.2–1.2)
Total Protein: 6.4 g/dL (ref 6.0–8.3)

## 2022-06-29 LAB — GAMMA GT: GGT: 13 U/L (ref 7–51)

## 2022-07-02 ENCOUNTER — Telehealth: Payer: Self-pay | Admitting: Family Medicine

## 2022-07-02 DIAGNOSIS — Z9181 History of falling: Secondary | ICD-10-CM | POA: Diagnosis not present

## 2022-07-02 DIAGNOSIS — M6259 Muscle wasting and atrophy, not elsewhere classified, multiple sites: Secondary | ICD-10-CM | POA: Diagnosis not present

## 2022-07-02 DIAGNOSIS — E785 Hyperlipidemia, unspecified: Secondary | ICD-10-CM | POA: Diagnosis not present

## 2022-07-02 DIAGNOSIS — Z9981 Dependence on supplemental oxygen: Secondary | ICD-10-CM | POA: Diagnosis not present

## 2022-07-02 DIAGNOSIS — M1990 Unspecified osteoarthritis, unspecified site: Secondary | ICD-10-CM | POA: Diagnosis not present

## 2022-07-02 DIAGNOSIS — G629 Polyneuropathy, unspecified: Secondary | ICD-10-CM | POA: Diagnosis not present

## 2022-07-02 DIAGNOSIS — R0902 Hypoxemia: Secondary | ICD-10-CM | POA: Diagnosis not present

## 2022-07-02 DIAGNOSIS — R1310 Dysphagia, unspecified: Secondary | ICD-10-CM | POA: Diagnosis not present

## 2022-07-02 DIAGNOSIS — R531 Weakness: Secondary | ICD-10-CM | POA: Diagnosis not present

## 2022-07-02 DIAGNOSIS — G43909 Migraine, unspecified, not intractable, without status migrainosus: Secondary | ICD-10-CM | POA: Diagnosis not present

## 2022-07-02 DIAGNOSIS — G9341 Metabolic encephalopathy: Secondary | ICD-10-CM | POA: Diagnosis not present

## 2022-07-02 DIAGNOSIS — Z556 Problems related to health literacy: Secondary | ICD-10-CM | POA: Diagnosis not present

## 2022-07-02 DIAGNOSIS — I10 Essential (primary) hypertension: Secondary | ICD-10-CM | POA: Diagnosis not present

## 2022-07-02 DIAGNOSIS — K219 Gastro-esophageal reflux disease without esophagitis: Secondary | ICD-10-CM | POA: Diagnosis not present

## 2022-07-02 NOTE — Telephone Encounter (Signed)
Patient dropped off document FL2, to be filled out by provider. Patient requested to send it via Fax within ASAP. Document is located in providers tray at front office.Please advise

## 2022-07-02 NOTE — Progress Notes (Signed)
Labs are all stable.  Do not need to do any other further testing at this point.  We can recheck again in 3 to 6 months.

## 2022-07-03 DIAGNOSIS — G43909 Migraine, unspecified, not intractable, without status migrainosus: Secondary | ICD-10-CM | POA: Diagnosis not present

## 2022-07-03 DIAGNOSIS — M6259 Muscle wasting and atrophy, not elsewhere classified, multiple sites: Secondary | ICD-10-CM | POA: Diagnosis not present

## 2022-07-03 DIAGNOSIS — E785 Hyperlipidemia, unspecified: Secondary | ICD-10-CM | POA: Diagnosis not present

## 2022-07-03 DIAGNOSIS — K219 Gastro-esophageal reflux disease without esophagitis: Secondary | ICD-10-CM | POA: Diagnosis not present

## 2022-07-03 DIAGNOSIS — I10 Essential (primary) hypertension: Secondary | ICD-10-CM | POA: Diagnosis not present

## 2022-07-03 DIAGNOSIS — R1310 Dysphagia, unspecified: Secondary | ICD-10-CM | POA: Diagnosis not present

## 2022-07-03 DIAGNOSIS — M1990 Unspecified osteoarthritis, unspecified site: Secondary | ICD-10-CM | POA: Diagnosis not present

## 2022-07-03 DIAGNOSIS — G629 Polyneuropathy, unspecified: Secondary | ICD-10-CM | POA: Diagnosis not present

## 2022-07-03 DIAGNOSIS — R531 Weakness: Secondary | ICD-10-CM | POA: Diagnosis not present

## 2022-07-03 DIAGNOSIS — Z9981 Dependence on supplemental oxygen: Secondary | ICD-10-CM | POA: Diagnosis not present

## 2022-07-03 DIAGNOSIS — G9341 Metabolic encephalopathy: Secondary | ICD-10-CM | POA: Diagnosis not present

## 2022-07-03 DIAGNOSIS — Z9181 History of falling: Secondary | ICD-10-CM | POA: Diagnosis not present

## 2022-07-03 DIAGNOSIS — R0902 Hypoxemia: Secondary | ICD-10-CM | POA: Diagnosis not present

## 2022-07-03 DIAGNOSIS — Z556 Problems related to health literacy: Secondary | ICD-10-CM | POA: Diagnosis not present

## 2022-07-04 NOTE — Telephone Encounter (Signed)
Form completed  Placed in PCP office to be reviewed

## 2022-07-04 NOTE — Telephone Encounter (Signed)
Pt is declining and needs form ASAP.

## 2022-07-04 NOTE — Telephone Encounter (Signed)
Left message to return call to our office at their convenience.  Need to ask you a couple of question to complete FL2 From

## 2022-07-06 ENCOUNTER — Telehealth: Payer: Self-pay | Admitting: *Deleted

## 2022-07-06 DIAGNOSIS — Z0279 Encounter for issue of other medical certificate: Secondary | ICD-10-CM

## 2022-07-06 NOTE — Telephone Encounter (Signed)
FL2 form faxed, fax#[phone] FL2 place to be scan in pt chart

## 2022-07-06 NOTE — Telephone Encounter (Signed)
[facility] form faxed today Fax#[phone] Form placed to be scan in Pt chart

## 2022-07-09 ENCOUNTER — Telehealth: Payer: Self-pay | Admitting: Family Medicine

## 2022-07-09 NOTE — Telephone Encounter (Signed)
Karla Foster with [facility] states pt had a fall this morning reported by pt's sister. Sister stated pt is ok & had no injuries. If any questions, please call [phone].

## 2022-07-11 DIAGNOSIS — Z556 Problems related to health literacy: Secondary | ICD-10-CM | POA: Diagnosis not present

## 2022-07-11 DIAGNOSIS — E785 Hyperlipidemia, unspecified: Secondary | ICD-10-CM | POA: Diagnosis not present

## 2022-07-11 DIAGNOSIS — R0902 Hypoxemia: Secondary | ICD-10-CM | POA: Diagnosis not present

## 2022-07-11 DIAGNOSIS — G9341 Metabolic encephalopathy: Secondary | ICD-10-CM | POA: Diagnosis not present

## 2022-07-11 DIAGNOSIS — R531 Weakness: Secondary | ICD-10-CM | POA: Diagnosis not present

## 2022-07-11 DIAGNOSIS — M6259 Muscle wasting and atrophy, not elsewhere classified, multiple sites: Secondary | ICD-10-CM | POA: Diagnosis not present

## 2022-07-11 DIAGNOSIS — G629 Polyneuropathy, unspecified: Secondary | ICD-10-CM | POA: Diagnosis not present

## 2022-07-11 DIAGNOSIS — R1310 Dysphagia, unspecified: Secondary | ICD-10-CM | POA: Diagnosis not present

## 2022-07-11 DIAGNOSIS — I10 Essential (primary) hypertension: Secondary | ICD-10-CM | POA: Diagnosis not present

## 2022-07-11 DIAGNOSIS — M1990 Unspecified osteoarthritis, unspecified site: Secondary | ICD-10-CM | POA: Diagnosis not present

## 2022-07-11 DIAGNOSIS — Z9981 Dependence on supplemental oxygen: Secondary | ICD-10-CM | POA: Diagnosis not present

## 2022-07-11 DIAGNOSIS — G43909 Migraine, unspecified, not intractable, without status migrainosus: Secondary | ICD-10-CM | POA: Diagnosis not present

## 2022-07-11 DIAGNOSIS — Z9181 History of falling: Secondary | ICD-10-CM | POA: Diagnosis not present

## 2022-07-11 DIAGNOSIS — K219 Gastro-esophageal reflux disease without esophagitis: Secondary | ICD-10-CM | POA: Diagnosis not present

## 2022-07-12 NOTE — Telephone Encounter (Signed)
Spoke with patient, stated has a bruise but no pain  Refused appointment

## 2022-07-16 DIAGNOSIS — E785 Hyperlipidemia, unspecified: Secondary | ICD-10-CM | POA: Diagnosis not present

## 2022-07-16 DIAGNOSIS — R531 Weakness: Secondary | ICD-10-CM | POA: Diagnosis not present

## 2022-07-16 DIAGNOSIS — Z9981 Dependence on supplemental oxygen: Secondary | ICD-10-CM | POA: Diagnosis not present

## 2022-07-16 DIAGNOSIS — G9341 Metabolic encephalopathy: Secondary | ICD-10-CM | POA: Diagnosis not present

## 2022-07-16 DIAGNOSIS — R0902 Hypoxemia: Secondary | ICD-10-CM | POA: Diagnosis not present

## 2022-07-16 DIAGNOSIS — G629 Polyneuropathy, unspecified: Secondary | ICD-10-CM | POA: Diagnosis not present

## 2022-07-16 DIAGNOSIS — K219 Gastro-esophageal reflux disease without esophagitis: Secondary | ICD-10-CM | POA: Diagnosis not present

## 2022-07-16 DIAGNOSIS — M6259 Muscle wasting and atrophy, not elsewhere classified, multiple sites: Secondary | ICD-10-CM | POA: Diagnosis not present

## 2022-07-16 DIAGNOSIS — R1310 Dysphagia, unspecified: Secondary | ICD-10-CM | POA: Diagnosis not present

## 2022-07-16 DIAGNOSIS — G43909 Migraine, unspecified, not intractable, without status migrainosus: Secondary | ICD-10-CM | POA: Diagnosis not present

## 2022-07-16 DIAGNOSIS — I10 Essential (primary) hypertension: Secondary | ICD-10-CM | POA: Diagnosis not present

## 2022-07-16 DIAGNOSIS — M1990 Unspecified osteoarthritis, unspecified site: Secondary | ICD-10-CM | POA: Diagnosis not present

## 2022-07-16 DIAGNOSIS — Z556 Problems related to health literacy: Secondary | ICD-10-CM | POA: Diagnosis not present

## 2022-07-16 DIAGNOSIS — Z9181 History of falling: Secondary | ICD-10-CM | POA: Diagnosis not present

## 2022-07-17 DIAGNOSIS — R0902 Hypoxemia: Secondary | ICD-10-CM | POA: Diagnosis not present

## 2022-07-17 DIAGNOSIS — M6259 Muscle wasting and atrophy, not elsewhere classified, multiple sites: Secondary | ICD-10-CM | POA: Diagnosis not present

## 2022-07-17 DIAGNOSIS — E785 Hyperlipidemia, unspecified: Secondary | ICD-10-CM | POA: Diagnosis not present

## 2022-07-17 DIAGNOSIS — M1990 Unspecified osteoarthritis, unspecified site: Secondary | ICD-10-CM | POA: Diagnosis not present

## 2022-07-17 DIAGNOSIS — Z9981 Dependence on supplemental oxygen: Secondary | ICD-10-CM | POA: Diagnosis not present

## 2022-07-17 DIAGNOSIS — R531 Weakness: Secondary | ICD-10-CM | POA: Diagnosis not present

## 2022-07-17 DIAGNOSIS — K219 Gastro-esophageal reflux disease without esophagitis: Secondary | ICD-10-CM | POA: Diagnosis not present

## 2022-07-17 DIAGNOSIS — I10 Essential (primary) hypertension: Secondary | ICD-10-CM | POA: Diagnosis not present

## 2022-07-17 DIAGNOSIS — Z9181 History of falling: Secondary | ICD-10-CM | POA: Diagnosis not present

## 2022-07-17 DIAGNOSIS — G43909 Migraine, unspecified, not intractable, without status migrainosus: Secondary | ICD-10-CM | POA: Diagnosis not present

## 2022-07-17 DIAGNOSIS — G9341 Metabolic encephalopathy: Secondary | ICD-10-CM | POA: Diagnosis not present

## 2022-07-17 DIAGNOSIS — R1310 Dysphagia, unspecified: Secondary | ICD-10-CM | POA: Diagnosis not present

## 2022-07-17 DIAGNOSIS — G629 Polyneuropathy, unspecified: Secondary | ICD-10-CM | POA: Diagnosis not present

## 2022-07-17 DIAGNOSIS — Z556 Problems related to health literacy: Secondary | ICD-10-CM | POA: Diagnosis not present

## 2022-07-18 DIAGNOSIS — Z9981 Dependence on supplemental oxygen: Secondary | ICD-10-CM | POA: Diagnosis not present

## 2022-07-18 DIAGNOSIS — M1990 Unspecified osteoarthritis, unspecified site: Secondary | ICD-10-CM | POA: Diagnosis not present

## 2022-07-18 DIAGNOSIS — K219 Gastro-esophageal reflux disease without esophagitis: Secondary | ICD-10-CM | POA: Diagnosis not present

## 2022-07-18 DIAGNOSIS — I10 Essential (primary) hypertension: Secondary | ICD-10-CM | POA: Diagnosis not present

## 2022-07-18 DIAGNOSIS — E785 Hyperlipidemia, unspecified: Secondary | ICD-10-CM | POA: Diagnosis not present

## 2022-07-18 DIAGNOSIS — Z556 Problems related to health literacy: Secondary | ICD-10-CM | POA: Diagnosis not present

## 2022-07-18 DIAGNOSIS — M6259 Muscle wasting and atrophy, not elsewhere classified, multiple sites: Secondary | ICD-10-CM | POA: Diagnosis not present

## 2022-07-18 DIAGNOSIS — Z9181 History of falling: Secondary | ICD-10-CM | POA: Diagnosis not present

## 2022-07-18 DIAGNOSIS — R531 Weakness: Secondary | ICD-10-CM | POA: Diagnosis not present

## 2022-07-18 DIAGNOSIS — G9341 Metabolic encephalopathy: Secondary | ICD-10-CM | POA: Diagnosis not present

## 2022-07-18 DIAGNOSIS — G43909 Migraine, unspecified, not intractable, without status migrainosus: Secondary | ICD-10-CM | POA: Diagnosis not present

## 2022-07-18 DIAGNOSIS — R0902 Hypoxemia: Secondary | ICD-10-CM | POA: Diagnosis not present

## 2022-07-18 DIAGNOSIS — G629 Polyneuropathy, unspecified: Secondary | ICD-10-CM | POA: Diagnosis not present

## 2022-07-18 DIAGNOSIS — R1310 Dysphagia, unspecified: Secondary | ICD-10-CM | POA: Diagnosis not present

## 2022-07-19 DIAGNOSIS — K219 Gastro-esophageal reflux disease without esophagitis: Secondary | ICD-10-CM | POA: Diagnosis not present

## 2022-07-19 DIAGNOSIS — R531 Weakness: Secondary | ICD-10-CM | POA: Diagnosis not present

## 2022-07-19 DIAGNOSIS — I10 Essential (primary) hypertension: Secondary | ICD-10-CM | POA: Diagnosis not present

## 2022-07-19 DIAGNOSIS — M6259 Muscle wasting and atrophy, not elsewhere classified, multiple sites: Secondary | ICD-10-CM | POA: Diagnosis not present

## 2022-07-19 DIAGNOSIS — E785 Hyperlipidemia, unspecified: Secondary | ICD-10-CM | POA: Diagnosis not present

## 2022-07-19 DIAGNOSIS — Z9981 Dependence on supplemental oxygen: Secondary | ICD-10-CM | POA: Diagnosis not present

## 2022-07-19 DIAGNOSIS — R0902 Hypoxemia: Secondary | ICD-10-CM | POA: Diagnosis not present

## 2022-07-19 DIAGNOSIS — Z9181 History of falling: Secondary | ICD-10-CM | POA: Diagnosis not present

## 2022-07-19 DIAGNOSIS — Z556 Problems related to health literacy: Secondary | ICD-10-CM | POA: Diagnosis not present

## 2022-07-19 DIAGNOSIS — G9341 Metabolic encephalopathy: Secondary | ICD-10-CM | POA: Diagnosis not present

## 2022-07-19 DIAGNOSIS — G43909 Migraine, unspecified, not intractable, without status migrainosus: Secondary | ICD-10-CM | POA: Diagnosis not present

## 2022-07-19 DIAGNOSIS — M1990 Unspecified osteoarthritis, unspecified site: Secondary | ICD-10-CM | POA: Diagnosis not present

## 2022-07-19 DIAGNOSIS — G629 Polyneuropathy, unspecified: Secondary | ICD-10-CM | POA: Diagnosis not present

## 2022-07-19 DIAGNOSIS — R1310 Dysphagia, unspecified: Secondary | ICD-10-CM | POA: Diagnosis not present

## 2022-07-21 DIAGNOSIS — G9341 Metabolic encephalopathy: Secondary | ICD-10-CM | POA: Diagnosis not present

## 2022-07-21 DIAGNOSIS — J449 Chronic obstructive pulmonary disease, unspecified: Secondary | ICD-10-CM | POA: Diagnosis not present

## 2022-07-23 DIAGNOSIS — G43909 Migraine, unspecified, not intractable, without status migrainosus: Secondary | ICD-10-CM | POA: Diagnosis not present

## 2022-07-23 DIAGNOSIS — R531 Weakness: Secondary | ICD-10-CM | POA: Diagnosis not present

## 2022-07-23 DIAGNOSIS — R0902 Hypoxemia: Secondary | ICD-10-CM | POA: Diagnosis not present

## 2022-07-23 DIAGNOSIS — G629 Polyneuropathy, unspecified: Secondary | ICD-10-CM | POA: Diagnosis not present

## 2022-07-23 DIAGNOSIS — Z9181 History of falling: Secondary | ICD-10-CM | POA: Diagnosis not present

## 2022-07-23 DIAGNOSIS — Z9981 Dependence on supplemental oxygen: Secondary | ICD-10-CM | POA: Diagnosis not present

## 2022-07-23 DIAGNOSIS — R1310 Dysphagia, unspecified: Secondary | ICD-10-CM | POA: Diagnosis not present

## 2022-07-23 DIAGNOSIS — I10 Essential (primary) hypertension: Secondary | ICD-10-CM | POA: Diagnosis not present

## 2022-07-23 DIAGNOSIS — G9341 Metabolic encephalopathy: Secondary | ICD-10-CM | POA: Diagnosis not present

## 2022-07-23 DIAGNOSIS — M1990 Unspecified osteoarthritis, unspecified site: Secondary | ICD-10-CM | POA: Diagnosis not present

## 2022-07-23 DIAGNOSIS — Z556 Problems related to health literacy: Secondary | ICD-10-CM | POA: Diagnosis not present

## 2022-07-23 DIAGNOSIS — M6259 Muscle wasting and atrophy, not elsewhere classified, multiple sites: Secondary | ICD-10-CM | POA: Diagnosis not present

## 2022-07-23 DIAGNOSIS — K219 Gastro-esophageal reflux disease without esophagitis: Secondary | ICD-10-CM | POA: Diagnosis not present

## 2022-07-23 DIAGNOSIS — E785 Hyperlipidemia, unspecified: Secondary | ICD-10-CM | POA: Diagnosis not present

## 2022-07-27 ENCOUNTER — Telehealth: Payer: Self-pay | Admitting: Family Medicine

## 2022-07-27 DIAGNOSIS — Z556 Problems related to health literacy: Secondary | ICD-10-CM | POA: Diagnosis not present

## 2022-07-27 DIAGNOSIS — M1990 Unspecified osteoarthritis, unspecified site: Secondary | ICD-10-CM | POA: Diagnosis not present

## 2022-07-27 DIAGNOSIS — I10 Essential (primary) hypertension: Secondary | ICD-10-CM | POA: Diagnosis not present

## 2022-07-27 DIAGNOSIS — E785 Hyperlipidemia, unspecified: Secondary | ICD-10-CM | POA: Diagnosis not present

## 2022-07-27 DIAGNOSIS — R531 Weakness: Secondary | ICD-10-CM | POA: Diagnosis not present

## 2022-07-27 DIAGNOSIS — Z9181 History of falling: Secondary | ICD-10-CM | POA: Diagnosis not present

## 2022-07-27 DIAGNOSIS — R1310 Dysphagia, unspecified: Secondary | ICD-10-CM | POA: Diagnosis not present

## 2022-07-27 DIAGNOSIS — G629 Polyneuropathy, unspecified: Secondary | ICD-10-CM | POA: Diagnosis not present

## 2022-07-27 DIAGNOSIS — G43909 Migraine, unspecified, not intractable, without status migrainosus: Secondary | ICD-10-CM | POA: Diagnosis not present

## 2022-07-27 DIAGNOSIS — Z9981 Dependence on supplemental oxygen: Secondary | ICD-10-CM | POA: Diagnosis not present

## 2022-07-27 DIAGNOSIS — M6259 Muscle wasting and atrophy, not elsewhere classified, multiple sites: Secondary | ICD-10-CM | POA: Diagnosis not present

## 2022-07-27 DIAGNOSIS — R0902 Hypoxemia: Secondary | ICD-10-CM | POA: Diagnosis not present

## 2022-07-27 DIAGNOSIS — K219 Gastro-esophageal reflux disease without esophagitis: Secondary | ICD-10-CM | POA: Diagnosis not present

## 2022-07-27 DIAGNOSIS — G9341 Metabolic encephalopathy: Secondary | ICD-10-CM | POA: Diagnosis not present

## 2022-07-27 NOTE — Telephone Encounter (Signed)
Home Health Verbal Orders  Agency:  [facility]  Caller:  Antonietta Barcelona and title  Requesting OT/ PT/ Skilled nursing/ Social Work/ Speech:    Reason for Request:  F/u - faxed order for bedside commode on the 31st. Do not see in notes that we received fax - they will refax order.  Frequency:    HH needs F2F w/in last 30 days      [phone]

## 2022-07-30 NOTE — Telephone Encounter (Signed)
Please advise 

## 2022-07-31 NOTE — Telephone Encounter (Signed)
I have not seen order but ok to fax over or give verbal if able.  Katina Degree. Jimmey Ralph, MD 07/31/2022 8:15 AM

## 2022-08-02 DIAGNOSIS — R1310 Dysphagia, unspecified: Secondary | ICD-10-CM | POA: Diagnosis not present

## 2022-08-02 DIAGNOSIS — Z556 Problems related to health literacy: Secondary | ICD-10-CM | POA: Diagnosis not present

## 2022-08-02 DIAGNOSIS — G9341 Metabolic encephalopathy: Secondary | ICD-10-CM | POA: Diagnosis not present

## 2022-08-02 DIAGNOSIS — E785 Hyperlipidemia, unspecified: Secondary | ICD-10-CM | POA: Diagnosis not present

## 2022-08-02 DIAGNOSIS — Z9981 Dependence on supplemental oxygen: Secondary | ICD-10-CM | POA: Diagnosis not present

## 2022-08-02 DIAGNOSIS — M6259 Muscle wasting and atrophy, not elsewhere classified, multiple sites: Secondary | ICD-10-CM | POA: Diagnosis not present

## 2022-08-02 DIAGNOSIS — G629 Polyneuropathy, unspecified: Secondary | ICD-10-CM | POA: Diagnosis not present

## 2022-08-02 DIAGNOSIS — M1990 Unspecified osteoarthritis, unspecified site: Secondary | ICD-10-CM | POA: Diagnosis not present

## 2022-08-02 DIAGNOSIS — K219 Gastro-esophageal reflux disease without esophagitis: Secondary | ICD-10-CM | POA: Diagnosis not present

## 2022-08-02 DIAGNOSIS — R0902 Hypoxemia: Secondary | ICD-10-CM | POA: Diagnosis not present

## 2022-08-02 DIAGNOSIS — R531 Weakness: Secondary | ICD-10-CM | POA: Diagnosis not present

## 2022-08-02 DIAGNOSIS — G43909 Migraine, unspecified, not intractable, without status migrainosus: Secondary | ICD-10-CM | POA: Diagnosis not present

## 2022-08-02 DIAGNOSIS — Z9181 History of falling: Secondary | ICD-10-CM | POA: Diagnosis not present

## 2022-08-02 DIAGNOSIS — I10 Essential (primary) hypertension: Secondary | ICD-10-CM | POA: Diagnosis not present

## 2022-08-02 NOTE — Telephone Encounter (Signed)
DME Order faxed to [phone] Form placed to be scan in Patient chart

## 2022-08-02 NOTE — Telephone Encounter (Signed)
[facility] [phone] Faxed to [phone] on 08/01/2022  Form placed to be scan in patient chart

## 2022-08-09 DIAGNOSIS — R0902 Hypoxemia: Secondary | ICD-10-CM | POA: Diagnosis not present

## 2022-08-09 DIAGNOSIS — R1310 Dysphagia, unspecified: Secondary | ICD-10-CM | POA: Diagnosis not present

## 2022-08-09 DIAGNOSIS — Z556 Problems related to health literacy: Secondary | ICD-10-CM | POA: Diagnosis not present

## 2022-08-09 DIAGNOSIS — G9341 Metabolic encephalopathy: Secondary | ICD-10-CM | POA: Diagnosis not present

## 2022-08-09 DIAGNOSIS — Z9181 History of falling: Secondary | ICD-10-CM | POA: Diagnosis not present

## 2022-08-09 DIAGNOSIS — M1990 Unspecified osteoarthritis, unspecified site: Secondary | ICD-10-CM | POA: Diagnosis not present

## 2022-08-09 DIAGNOSIS — M6259 Muscle wasting and atrophy, not elsewhere classified, multiple sites: Secondary | ICD-10-CM | POA: Diagnosis not present

## 2022-08-09 DIAGNOSIS — R531 Weakness: Secondary | ICD-10-CM | POA: Diagnosis not present

## 2022-08-09 DIAGNOSIS — G43909 Migraine, unspecified, not intractable, without status migrainosus: Secondary | ICD-10-CM | POA: Diagnosis not present

## 2022-08-09 DIAGNOSIS — I10 Essential (primary) hypertension: Secondary | ICD-10-CM | POA: Diagnosis not present

## 2022-08-09 DIAGNOSIS — Z9981 Dependence on supplemental oxygen: Secondary | ICD-10-CM | POA: Diagnosis not present

## 2022-08-09 DIAGNOSIS — G629 Polyneuropathy, unspecified: Secondary | ICD-10-CM | POA: Diagnosis not present

## 2022-08-09 DIAGNOSIS — K219 Gastro-esophageal reflux disease without esophagitis: Secondary | ICD-10-CM | POA: Diagnosis not present

## 2022-08-09 DIAGNOSIS — E785 Hyperlipidemia, unspecified: Secondary | ICD-10-CM | POA: Diagnosis not present

## 2022-08-10 DIAGNOSIS — I10 Essential (primary) hypertension: Secondary | ICD-10-CM | POA: Diagnosis not present

## 2022-08-10 DIAGNOSIS — G629 Polyneuropathy, unspecified: Secondary | ICD-10-CM | POA: Diagnosis not present

## 2022-08-10 DIAGNOSIS — R1310 Dysphagia, unspecified: Secondary | ICD-10-CM | POA: Diagnosis not present

## 2022-08-10 DIAGNOSIS — M1990 Unspecified osteoarthritis, unspecified site: Secondary | ICD-10-CM | POA: Diagnosis not present

## 2022-08-10 DIAGNOSIS — M6259 Muscle wasting and atrophy, not elsewhere classified, multiple sites: Secondary | ICD-10-CM | POA: Diagnosis not present

## 2022-08-15 ENCOUNTER — Telehealth: Payer: Self-pay | Admitting: Family Medicine

## 2022-08-15 DIAGNOSIS — G629 Polyneuropathy, unspecified: Secondary | ICD-10-CM | POA: Diagnosis not present

## 2022-08-15 DIAGNOSIS — K219 Gastro-esophageal reflux disease without esophagitis: Secondary | ICD-10-CM | POA: Diagnosis not present

## 2022-08-15 DIAGNOSIS — I270 Primary pulmonary hypertension: Secondary | ICD-10-CM | POA: Diagnosis not present

## 2022-08-15 DIAGNOSIS — E785 Hyperlipidemia, unspecified: Secondary | ICD-10-CM | POA: Diagnosis not present

## 2022-08-15 DIAGNOSIS — R0902 Hypoxemia: Secondary | ICD-10-CM | POA: Diagnosis not present

## 2022-08-15 DIAGNOSIS — I10 Essential (primary) hypertension: Secondary | ICD-10-CM | POA: Diagnosis not present

## 2022-08-15 DIAGNOSIS — J449 Chronic obstructive pulmonary disease, unspecified: Secondary | ICD-10-CM | POA: Diagnosis not present

## 2022-08-15 DIAGNOSIS — R1312 Dysphagia, oropharyngeal phase: Secondary | ICD-10-CM | POA: Diagnosis not present

## 2022-08-15 DIAGNOSIS — M6259 Muscle wasting and atrophy, not elsewhere classified, multiple sites: Secondary | ICD-10-CM | POA: Diagnosis not present

## 2022-08-15 DIAGNOSIS — M1990 Unspecified osteoarthritis, unspecified site: Secondary | ICD-10-CM | POA: Diagnosis not present

## 2022-08-15 DIAGNOSIS — Z9181 History of falling: Secondary | ICD-10-CM | POA: Diagnosis not present

## 2022-08-15 DIAGNOSIS — Z9981 Dependence on supplemental oxygen: Secondary | ICD-10-CM | POA: Diagnosis not present

## 2022-08-15 DIAGNOSIS — R531 Weakness: Secondary | ICD-10-CM | POA: Diagnosis not present

## 2022-08-15 DIAGNOSIS — G43909 Migraine, unspecified, not intractable, without status migrainosus: Secondary | ICD-10-CM | POA: Diagnosis not present

## 2022-08-15 DIAGNOSIS — G9341 Metabolic encephalopathy: Secondary | ICD-10-CM | POA: Diagnosis not present

## 2022-08-15 NOTE — Telephone Encounter (Signed)
Patient dropped off document Home Health Certificate (Order ID [id number]), to be filled out by provider. Patient requested to send it back via Fax within 5-days. Document is located in providers tray at front office.Please advise

## 2022-08-16 DIAGNOSIS — R41841 Cognitive communication deficit: Secondary | ICD-10-CM

## 2022-08-16 DIAGNOSIS — G9341 Metabolic encephalopathy: Secondary | ICD-10-CM | POA: Diagnosis not present

## 2022-08-16 DIAGNOSIS — M6259 Muscle wasting and atrophy, not elsewhere classified, multiple sites: Secondary | ICD-10-CM | POA: Diagnosis not present

## 2022-08-16 DIAGNOSIS — G629 Polyneuropathy, unspecified: Secondary | ICD-10-CM | POA: Diagnosis not present

## 2022-08-16 DIAGNOSIS — I10 Essential (primary) hypertension: Secondary | ICD-10-CM | POA: Diagnosis not present

## 2022-08-16 DIAGNOSIS — F329 Major depressive disorder, single episode, unspecified: Secondary | ICD-10-CM

## 2022-08-16 DIAGNOSIS — J449 Chronic obstructive pulmonary disease, unspecified: Secondary | ICD-10-CM | POA: Diagnosis not present

## 2022-08-16 DIAGNOSIS — Z9181 History of falling: Secondary | ICD-10-CM

## 2022-08-16 DIAGNOSIS — G43909 Migraine, unspecified, not intractable, without status migrainosus: Secondary | ICD-10-CM | POA: Diagnosis not present

## 2022-08-16 DIAGNOSIS — F419 Anxiety disorder, unspecified: Secondary | ICD-10-CM

## 2022-08-16 DIAGNOSIS — R0902 Hypoxemia: Secondary | ICD-10-CM

## 2022-08-16 DIAGNOSIS — R531 Weakness: Secondary | ICD-10-CM

## 2022-08-16 DIAGNOSIS — K219 Gastro-esophageal reflux disease without esophagitis: Secondary | ICD-10-CM | POA: Diagnosis not present

## 2022-08-16 DIAGNOSIS — I270 Primary pulmonary hypertension: Secondary | ICD-10-CM | POA: Diagnosis not present

## 2022-08-16 DIAGNOSIS — R1312 Dysphagia, oropharyngeal phase: Secondary | ICD-10-CM | POA: Diagnosis not present

## 2022-08-16 DIAGNOSIS — E785 Hyperlipidemia, unspecified: Secondary | ICD-10-CM

## 2022-08-16 DIAGNOSIS — M1990 Unspecified osteoarthritis, unspecified site: Secondary | ICD-10-CM | POA: Diagnosis not present

## 2022-08-16 DIAGNOSIS — Z9981 Dependence on supplemental oxygen: Secondary | ICD-10-CM

## 2022-08-17 ENCOUNTER — Other Ambulatory Visit: Payer: Self-pay | Admitting: *Deleted

## 2022-08-17 MED ORDER — DULOXETINE HCL 60 MG PO CPEP: 60.0000 mg | ORAL_CAPSULE | Freq: Every day | ORAL | 3 refills | Status: DC

## 2022-08-17 MED ORDER — ATORVASTATIN CALCIUM 40 MG PO TABS: 40.0000 mg | ORAL_TABLET | Freq: Every day | ORAL | 3 refills | Status: DC

## 2022-08-17 MED ORDER — LIDOCAINE 4 % EX PTCH: 1.0000 | MEDICATED_PATCH | CUTANEOUS | 5 refills | Status: DC

## 2022-08-17 MED ORDER — GABAPENTIN 300 MG PO CAPS: 300.0000 mg | ORAL_CAPSULE | Freq: Three times a day (TID) | ORAL | 3 refills | Status: DC

## 2022-08-17 MED ORDER — CLONAZEPAM 1 MG PO TABS: 1.0000 mg | ORAL_TABLET | Freq: Two times a day (BID) | ORAL | 5 refills | Status: DC | PRN

## 2022-08-17 MED ORDER — ESOMEPRAZOLE MAGNESIUM 40 MG PO CPDR: 40.0000 mg | DELAYED_RELEASE_CAPSULE | Freq: Every day | ORAL | 3 refills | Status: DC

## 2022-08-17 MED ORDER — TRAZODONE HCL 300 MG PO TABS: 300.0000 mg | ORAL_TABLET | Freq: Every day | ORAL | 3 refills | Status: DC

## 2022-08-17 NOTE — Telephone Encounter (Signed)
Pt requesting refills for following: Clonazepam, Gabapentin and Lidocaine patch. I do not see Lidocaine patch on med list. Pt requesting refills be sent to [facility] mail order.

## 2022-08-17 NOTE — Telephone Encounter (Signed)
Orders signed and faxed back to [facility] at [phone]. 

## 2022-08-22 DIAGNOSIS — I270 Primary pulmonary hypertension: Secondary | ICD-10-CM | POA: Diagnosis not present

## 2022-08-22 DIAGNOSIS — G9341 Metabolic encephalopathy: Secondary | ICD-10-CM | POA: Diagnosis not present

## 2022-08-22 DIAGNOSIS — G629 Polyneuropathy, unspecified: Secondary | ICD-10-CM | POA: Diagnosis not present

## 2022-08-22 DIAGNOSIS — R0902 Hypoxemia: Secondary | ICD-10-CM | POA: Diagnosis not present

## 2022-08-22 DIAGNOSIS — M1990 Unspecified osteoarthritis, unspecified site: Secondary | ICD-10-CM | POA: Diagnosis not present

## 2022-08-22 DIAGNOSIS — M6259 Muscle wasting and atrophy, not elsewhere classified, multiple sites: Secondary | ICD-10-CM | POA: Diagnosis not present

## 2022-08-22 DIAGNOSIS — Z9181 History of falling: Secondary | ICD-10-CM | POA: Diagnosis not present

## 2022-08-22 DIAGNOSIS — I10 Essential (primary) hypertension: Secondary | ICD-10-CM | POA: Diagnosis not present

## 2022-08-22 DIAGNOSIS — R1312 Dysphagia, oropharyngeal phase: Secondary | ICD-10-CM | POA: Diagnosis not present

## 2022-08-22 DIAGNOSIS — J449 Chronic obstructive pulmonary disease, unspecified: Secondary | ICD-10-CM | POA: Diagnosis not present

## 2022-08-22 DIAGNOSIS — E785 Hyperlipidemia, unspecified: Secondary | ICD-10-CM | POA: Diagnosis not present

## 2022-08-22 DIAGNOSIS — R531 Weakness: Secondary | ICD-10-CM | POA: Diagnosis not present

## 2022-08-22 DIAGNOSIS — G43909 Migraine, unspecified, not intractable, without status migrainosus: Secondary | ICD-10-CM | POA: Diagnosis not present

## 2022-08-22 DIAGNOSIS — Z9981 Dependence on supplemental oxygen: Secondary | ICD-10-CM | POA: Diagnosis not present

## 2022-08-22 DIAGNOSIS — K219 Gastro-esophageal reflux disease without esophagitis: Secondary | ICD-10-CM | POA: Diagnosis not present

## 2022-08-29 DIAGNOSIS — E785 Hyperlipidemia, unspecified: Secondary | ICD-10-CM | POA: Diagnosis not present

## 2022-08-29 DIAGNOSIS — Z9181 History of falling: Secondary | ICD-10-CM | POA: Diagnosis not present

## 2022-08-29 DIAGNOSIS — I10 Essential (primary) hypertension: Secondary | ICD-10-CM | POA: Diagnosis not present

## 2022-08-29 DIAGNOSIS — I270 Primary pulmonary hypertension: Secondary | ICD-10-CM | POA: Diagnosis not present

## 2022-08-29 DIAGNOSIS — R1312 Dysphagia, oropharyngeal phase: Secondary | ICD-10-CM | POA: Diagnosis not present

## 2022-08-29 DIAGNOSIS — M6259 Muscle wasting and atrophy, not elsewhere classified, multiple sites: Secondary | ICD-10-CM | POA: Diagnosis not present

## 2022-08-29 DIAGNOSIS — Z9981 Dependence on supplemental oxygen: Secondary | ICD-10-CM | POA: Diagnosis not present

## 2022-08-29 DIAGNOSIS — G43909 Migraine, unspecified, not intractable, without status migrainosus: Secondary | ICD-10-CM | POA: Diagnosis not present

## 2022-08-29 DIAGNOSIS — G629 Polyneuropathy, unspecified: Secondary | ICD-10-CM | POA: Diagnosis not present

## 2022-08-29 DIAGNOSIS — J449 Chronic obstructive pulmonary disease, unspecified: Secondary | ICD-10-CM | POA: Diagnosis not present

## 2022-08-29 DIAGNOSIS — K219 Gastro-esophageal reflux disease without esophagitis: Secondary | ICD-10-CM | POA: Diagnosis not present

## 2022-08-29 DIAGNOSIS — G9341 Metabolic encephalopathy: Secondary | ICD-10-CM | POA: Diagnosis not present

## 2022-08-29 DIAGNOSIS — R0902 Hypoxemia: Secondary | ICD-10-CM | POA: Diagnosis not present

## 2022-08-29 DIAGNOSIS — R531 Weakness: Secondary | ICD-10-CM | POA: Diagnosis not present

## 2022-08-29 DIAGNOSIS — M1990 Unspecified osteoarthritis, unspecified site: Secondary | ICD-10-CM | POA: Diagnosis not present

## 2022-09-03 DIAGNOSIS — I270 Primary pulmonary hypertension: Secondary | ICD-10-CM | POA: Diagnosis not present

## 2022-09-03 DIAGNOSIS — E785 Hyperlipidemia, unspecified: Secondary | ICD-10-CM | POA: Diagnosis not present

## 2022-09-03 DIAGNOSIS — R531 Weakness: Secondary | ICD-10-CM | POA: Diagnosis not present

## 2022-09-03 DIAGNOSIS — J449 Chronic obstructive pulmonary disease, unspecified: Secondary | ICD-10-CM | POA: Diagnosis not present

## 2022-09-03 DIAGNOSIS — I10 Essential (primary) hypertension: Secondary | ICD-10-CM | POA: Diagnosis not present

## 2022-09-03 DIAGNOSIS — R0902 Hypoxemia: Secondary | ICD-10-CM | POA: Diagnosis not present

## 2022-09-03 DIAGNOSIS — G43909 Migraine, unspecified, not intractable, without status migrainosus: Secondary | ICD-10-CM | POA: Diagnosis not present

## 2022-09-03 DIAGNOSIS — G9341 Metabolic encephalopathy: Secondary | ICD-10-CM | POA: Diagnosis not present

## 2022-09-03 DIAGNOSIS — R1312 Dysphagia, oropharyngeal phase: Secondary | ICD-10-CM | POA: Diagnosis not present

## 2022-09-03 DIAGNOSIS — K219 Gastro-esophageal reflux disease without esophagitis: Secondary | ICD-10-CM | POA: Diagnosis not present

## 2022-09-03 DIAGNOSIS — M1990 Unspecified osteoarthritis, unspecified site: Secondary | ICD-10-CM | POA: Diagnosis not present

## 2022-09-03 DIAGNOSIS — Z9181 History of falling: Secondary | ICD-10-CM | POA: Diagnosis not present

## 2022-09-03 DIAGNOSIS — G629 Polyneuropathy, unspecified: Secondary | ICD-10-CM | POA: Diagnosis not present

## 2022-09-03 DIAGNOSIS — Z9981 Dependence on supplemental oxygen: Secondary | ICD-10-CM | POA: Diagnosis not present

## 2022-09-03 DIAGNOSIS — M6259 Muscle wasting and atrophy, not elsewhere classified, multiple sites: Secondary | ICD-10-CM | POA: Diagnosis not present

## 2022-09-07 DIAGNOSIS — M1990 Unspecified osteoarthritis, unspecified site: Secondary | ICD-10-CM | POA: Diagnosis not present

## 2022-09-07 DIAGNOSIS — Z9981 Dependence on supplemental oxygen: Secondary | ICD-10-CM | POA: Diagnosis not present

## 2022-09-07 DIAGNOSIS — M6259 Muscle wasting and atrophy, not elsewhere classified, multiple sites: Secondary | ICD-10-CM | POA: Diagnosis not present

## 2022-09-07 DIAGNOSIS — Z9181 History of falling: Secondary | ICD-10-CM | POA: Diagnosis not present

## 2022-09-07 DIAGNOSIS — I10 Essential (primary) hypertension: Secondary | ICD-10-CM | POA: Diagnosis not present

## 2022-09-07 DIAGNOSIS — E785 Hyperlipidemia, unspecified: Secondary | ICD-10-CM | POA: Diagnosis not present

## 2022-09-07 DIAGNOSIS — K219 Gastro-esophageal reflux disease without esophagitis: Secondary | ICD-10-CM | POA: Diagnosis not present

## 2022-09-07 DIAGNOSIS — R1312 Dysphagia, oropharyngeal phase: Secondary | ICD-10-CM | POA: Diagnosis not present

## 2022-09-07 DIAGNOSIS — R531 Weakness: Secondary | ICD-10-CM | POA: Diagnosis not present

## 2022-09-07 DIAGNOSIS — R0902 Hypoxemia: Secondary | ICD-10-CM | POA: Diagnosis not present

## 2022-09-07 DIAGNOSIS — J449 Chronic obstructive pulmonary disease, unspecified: Secondary | ICD-10-CM | POA: Diagnosis not present

## 2022-09-07 DIAGNOSIS — G9341 Metabolic encephalopathy: Secondary | ICD-10-CM | POA: Diagnosis not present

## 2022-09-07 DIAGNOSIS — I270 Primary pulmonary hypertension: Secondary | ICD-10-CM | POA: Diagnosis not present

## 2022-09-07 DIAGNOSIS — G43909 Migraine, unspecified, not intractable, without status migrainosus: Secondary | ICD-10-CM | POA: Diagnosis not present

## 2022-09-07 DIAGNOSIS — G629 Polyneuropathy, unspecified: Secondary | ICD-10-CM | POA: Diagnosis not present

## 2022-09-10 DIAGNOSIS — G9341 Metabolic encephalopathy: Secondary | ICD-10-CM | POA: Diagnosis not present

## 2022-09-10 DIAGNOSIS — J449 Chronic obstructive pulmonary disease, unspecified: Secondary | ICD-10-CM | POA: Diagnosis not present

## 2022-09-13 DIAGNOSIS — Z9981 Dependence on supplemental oxygen: Secondary | ICD-10-CM | POA: Diagnosis not present

## 2022-09-13 DIAGNOSIS — M1990 Unspecified osteoarthritis, unspecified site: Secondary | ICD-10-CM | POA: Diagnosis not present

## 2022-09-13 DIAGNOSIS — K219 Gastro-esophageal reflux disease without esophagitis: Secondary | ICD-10-CM | POA: Diagnosis not present

## 2022-09-13 DIAGNOSIS — Z9181 History of falling: Secondary | ICD-10-CM | POA: Diagnosis not present

## 2022-09-13 DIAGNOSIS — R1312 Dysphagia, oropharyngeal phase: Secondary | ICD-10-CM | POA: Diagnosis not present

## 2022-09-13 DIAGNOSIS — G9341 Metabolic encephalopathy: Secondary | ICD-10-CM | POA: Diagnosis not present

## 2022-09-13 DIAGNOSIS — I270 Primary pulmonary hypertension: Secondary | ICD-10-CM | POA: Diagnosis not present

## 2022-09-13 DIAGNOSIS — E785 Hyperlipidemia, unspecified: Secondary | ICD-10-CM | POA: Diagnosis not present

## 2022-09-13 DIAGNOSIS — G629 Polyneuropathy, unspecified: Secondary | ICD-10-CM | POA: Diagnosis not present

## 2022-09-13 DIAGNOSIS — G43909 Migraine, unspecified, not intractable, without status migrainosus: Secondary | ICD-10-CM | POA: Diagnosis not present

## 2022-09-13 DIAGNOSIS — M6259 Muscle wasting and atrophy, not elsewhere classified, multiple sites: Secondary | ICD-10-CM | POA: Diagnosis not present

## 2022-09-13 DIAGNOSIS — R0902 Hypoxemia: Secondary | ICD-10-CM | POA: Diagnosis not present

## 2022-09-13 DIAGNOSIS — I10 Essential (primary) hypertension: Secondary | ICD-10-CM | POA: Diagnosis not present

## 2022-09-13 DIAGNOSIS — R531 Weakness: Secondary | ICD-10-CM | POA: Diagnosis not present

## 2022-09-13 DIAGNOSIS — J449 Chronic obstructive pulmonary disease, unspecified: Secondary | ICD-10-CM | POA: Diagnosis not present

## 2022-09-17 DIAGNOSIS — I10 Essential (primary) hypertension: Secondary | ICD-10-CM | POA: Diagnosis not present

## 2022-09-17 DIAGNOSIS — Z9181 History of falling: Secondary | ICD-10-CM | POA: Diagnosis not present

## 2022-09-17 DIAGNOSIS — R0902 Hypoxemia: Secondary | ICD-10-CM | POA: Diagnosis not present

## 2022-09-17 DIAGNOSIS — M1990 Unspecified osteoarthritis, unspecified site: Secondary | ICD-10-CM | POA: Diagnosis not present

## 2022-09-17 DIAGNOSIS — J449 Chronic obstructive pulmonary disease, unspecified: Secondary | ICD-10-CM | POA: Diagnosis not present

## 2022-09-17 DIAGNOSIS — I270 Primary pulmonary hypertension: Secondary | ICD-10-CM | POA: Diagnosis not present

## 2022-09-17 DIAGNOSIS — R531 Weakness: Secondary | ICD-10-CM | POA: Diagnosis not present

## 2022-09-17 DIAGNOSIS — G629 Polyneuropathy, unspecified: Secondary | ICD-10-CM | POA: Diagnosis not present

## 2022-09-17 DIAGNOSIS — M6259 Muscle wasting and atrophy, not elsewhere classified, multiple sites: Secondary | ICD-10-CM | POA: Diagnosis not present

## 2022-09-17 DIAGNOSIS — E785 Hyperlipidemia, unspecified: Secondary | ICD-10-CM | POA: Diagnosis not present

## 2022-09-17 DIAGNOSIS — G43909 Migraine, unspecified, not intractable, without status migrainosus: Secondary | ICD-10-CM | POA: Diagnosis not present

## 2022-09-17 DIAGNOSIS — R1312 Dysphagia, oropharyngeal phase: Secondary | ICD-10-CM | POA: Diagnosis not present

## 2022-09-17 DIAGNOSIS — G9341 Metabolic encephalopathy: Secondary | ICD-10-CM | POA: Diagnosis not present

## 2022-09-17 DIAGNOSIS — Z9981 Dependence on supplemental oxygen: Secondary | ICD-10-CM | POA: Diagnosis not present

## 2022-09-17 DIAGNOSIS — K219 Gastro-esophageal reflux disease without esophagitis: Secondary | ICD-10-CM | POA: Diagnosis not present

## 2022-09-18 DIAGNOSIS — Z9981 Dependence on supplemental oxygen: Secondary | ICD-10-CM | POA: Diagnosis not present

## 2022-09-18 DIAGNOSIS — I270 Primary pulmonary hypertension: Secondary | ICD-10-CM | POA: Diagnosis not present

## 2022-09-18 DIAGNOSIS — G43909 Migraine, unspecified, not intractable, without status migrainosus: Secondary | ICD-10-CM | POA: Diagnosis not present

## 2022-09-18 DIAGNOSIS — G629 Polyneuropathy, unspecified: Secondary | ICD-10-CM | POA: Diagnosis not present

## 2022-09-18 DIAGNOSIS — G9341 Metabolic encephalopathy: Secondary | ICD-10-CM | POA: Diagnosis not present

## 2022-09-18 DIAGNOSIS — R0902 Hypoxemia: Secondary | ICD-10-CM | POA: Diagnosis not present

## 2022-09-18 DIAGNOSIS — I10 Essential (primary) hypertension: Secondary | ICD-10-CM | POA: Diagnosis not present

## 2022-09-18 DIAGNOSIS — M6259 Muscle wasting and atrophy, not elsewhere classified, multiple sites: Secondary | ICD-10-CM | POA: Diagnosis not present

## 2022-09-18 DIAGNOSIS — Z9181 History of falling: Secondary | ICD-10-CM | POA: Diagnosis not present

## 2022-09-18 DIAGNOSIS — J449 Chronic obstructive pulmonary disease, unspecified: Secondary | ICD-10-CM | POA: Diagnosis not present

## 2022-09-18 DIAGNOSIS — R531 Weakness: Secondary | ICD-10-CM | POA: Diagnosis not present

## 2022-09-18 DIAGNOSIS — R1312 Dysphagia, oropharyngeal phase: Secondary | ICD-10-CM | POA: Diagnosis not present

## 2022-09-18 DIAGNOSIS — M1990 Unspecified osteoarthritis, unspecified site: Secondary | ICD-10-CM | POA: Diagnosis not present

## 2022-09-18 DIAGNOSIS — E785 Hyperlipidemia, unspecified: Secondary | ICD-10-CM | POA: Diagnosis not present

## 2022-09-18 DIAGNOSIS — K219 Gastro-esophageal reflux disease without esophagitis: Secondary | ICD-10-CM | POA: Diagnosis not present

## 2022-09-26 DIAGNOSIS — K219 Gastro-esophageal reflux disease without esophagitis: Secondary | ICD-10-CM | POA: Diagnosis not present

## 2022-09-26 DIAGNOSIS — Z9181 History of falling: Secondary | ICD-10-CM | POA: Diagnosis not present

## 2022-09-26 DIAGNOSIS — M1990 Unspecified osteoarthritis, unspecified site: Secondary | ICD-10-CM | POA: Diagnosis not present

## 2022-09-26 DIAGNOSIS — M6259 Muscle wasting and atrophy, not elsewhere classified, multiple sites: Secondary | ICD-10-CM | POA: Diagnosis not present

## 2022-09-26 DIAGNOSIS — I10 Essential (primary) hypertension: Secondary | ICD-10-CM | POA: Diagnosis not present

## 2022-09-26 DIAGNOSIS — G43909 Migraine, unspecified, not intractable, without status migrainosus: Secondary | ICD-10-CM | POA: Diagnosis not present

## 2022-09-26 DIAGNOSIS — E785 Hyperlipidemia, unspecified: Secondary | ICD-10-CM | POA: Diagnosis not present

## 2022-09-26 DIAGNOSIS — R531 Weakness: Secondary | ICD-10-CM | POA: Diagnosis not present

## 2022-09-26 DIAGNOSIS — G629 Polyneuropathy, unspecified: Secondary | ICD-10-CM | POA: Diagnosis not present

## 2022-09-26 DIAGNOSIS — J449 Chronic obstructive pulmonary disease, unspecified: Secondary | ICD-10-CM | POA: Diagnosis not present

## 2022-09-26 DIAGNOSIS — R1312 Dysphagia, oropharyngeal phase: Secondary | ICD-10-CM | POA: Diagnosis not present

## 2022-09-26 DIAGNOSIS — Z9981 Dependence on supplemental oxygen: Secondary | ICD-10-CM | POA: Diagnosis not present

## 2022-09-26 DIAGNOSIS — R0902 Hypoxemia: Secondary | ICD-10-CM | POA: Diagnosis not present

## 2022-09-26 DIAGNOSIS — I270 Primary pulmonary hypertension: Secondary | ICD-10-CM | POA: Diagnosis not present

## 2022-09-26 DIAGNOSIS — G9341 Metabolic encephalopathy: Secondary | ICD-10-CM | POA: Diagnosis not present

## 2022-09-27 ENCOUNTER — Ambulatory Visit: Payer: 59 | Admitting: Family Medicine

## 2022-10-03 ENCOUNTER — Ambulatory Visit (INDEPENDENT_AMBULATORY_CARE_PROVIDER_SITE_OTHER): Payer: 59 | Admitting: Family Medicine

## 2022-10-03 ENCOUNTER — Encounter: Payer: Self-pay | Admitting: Family Medicine

## 2022-10-03 VITALS — BP 103/68 | HR 61 | Temp 97.8°F | Ht 67.0 in | Wt 176.4 lb

## 2022-10-03 DIAGNOSIS — R251 Tremor, unspecified: Secondary | ICD-10-CM | POA: Diagnosis not present

## 2022-10-03 DIAGNOSIS — F419 Anxiety disorder, unspecified: Secondary | ICD-10-CM

## 2022-10-03 DIAGNOSIS — Z1239 Encounter for other screening for malignant neoplasm of breast: Secondary | ICD-10-CM

## 2022-10-03 DIAGNOSIS — Z609 Problem related to social environment, unspecified: Secondary | ICD-10-CM | POA: Diagnosis not present

## 2022-10-03 MED ORDER — CLONAZEPAM 1 MG PO TABS: 1.0000 mg | ORAL_TABLET | Freq: Two times a day (BID) | ORAL | 5 refills | Status: DC | PRN

## 2022-10-03 NOTE — Assessment & Plan Note (Signed)
No significant pain since her last visit.  We did refer her to neurology a few months ago and this was excepted however she was not able to schedule an appointment yet.  We gave contact information today and advised her to schedule appointment soon.

## 2022-10-03 NOTE — Assessment & Plan Note (Signed)
She has been out of clonazepam for the last month or so.  Apparently there was a mixup at the pharmacy and her most recent prescription was sent into the mail-in pharmacy which is apparently unable to mail this to her.  We will send her prescription back over to [facility].  She will also continue her Cymbalta 60 mg daily and trazodone 300 mg nightly.

## 2022-10-03 NOTE — Progress Notes (Signed)
   Karla Foster is a 67 y.o. female who presents today for an office visit.  Assessment/Plan:  Chronic Problems Addressed Today: Anxiety She has been out of clonazepam for the last month or so.  Apparently there was a mixup at the pharmacy and her most recent prescription was sent into the mail-in pharmacy which is apparently unable to mail this to her.  We [facility]  She will also continue her Cymbalta 60 mg daily and trazodone 300 mg nightly.  Tremor No significant pain since her last visit.  We did refer her to neurology a few months ago and this was excepted however she was not able to schedule an appointment yet.  We gave contact information today and advised her to schedule appointment soon.  Poor social situation Patient has limited access to transportation and has to rely on third-party's to get to and from her appointments.  This does make it difficult for her to pick up prescriptions as well as attend other office visits.  Will place referral to [facility] to see if they can assist with this.  Preventative Healthcare Due for mammogram however does have difficulty with getting to appointments.  We will order today and hopefully she can get transportation arranged for this.  She is also due for Tdap and shingles vaccine.  She can get these done at the pharmacy.  Up-to-date on colonoscopy and is due again for this in 3 years.    Subjective:  HPI:  See A/P for status of chronic conditions.  Patient is here today for 78-month follow-up.  Was seen 3 months ago.At our last visit, we referred her to neurology for her tremor and continue her other medications.  She has been out of clonazepam for the last months or so. Unfortunately there was an issue and this was send to the mail in pharmacy. She tells me that they told her that cannot send this via the mail due to it being a controlled substance.  She has not been able to schedule appoint with neurology.   Does admit to having quite a bit of issues with transportation.  She is also the primary caregiver for her husband who is dealing with multiple chronic medical conditions.  They had previously been getting home health however they were recently graduated from this program.  They have been trying to get into a SNF or other facility however insurance would not approve this.  They do not have any food insecurity however it is difficult for them to accomplish tasks at home.       Objective:  Physical Exam: BP 103/68   Pulse 61   Temp 97.8 F (36.6 C) (Temporal)   Ht 5\' 7"  (1.702 m)   Wt 176 lb 6.4 oz (80 kg)   SpO2 96%   BMI 27.63 kg/m   Wt Readings from Last 3 Encounters:  10/03/22 176 lb 6.4 oz (80 kg)  06/26/22 180 lb (81.6 kg)  05/15/22 190 lb (86.2 kg)  Gen: No acute distress, resting comfortably CV: Regular rate and rhythm with no murmurs appreciated Pulm: Normal work of breathing, clear to auscultation bilaterally with no crackles, wheezes, or rhonchi Neuro: Grossly normal, moves all extremities Psych: Normal affect and thought content      Alorah Mcree M. Jimmey Ralph, MD 10/03/2022 9:17 AM

## 2022-10-03 NOTE — Patient Instructions (Addendum)
It was very nice to see you today!  I will refill you clonazepam today.   Please call to schedule an appointment with neurology at  [phone].  We will refer you to [facility] to see if they can help with your transportation issues  Return in about 6 months (around 04/05/2023) for Follow Up.   Take care, Dr Jimmey Ralph  PLEASE NOTE:  If you had any lab tests, please let us know if you have not heard back within a few days. You may see your results on mychart before we have a chance to review them but we will give you a call once they are reviewed by [place].   If we ordered any referrals today, please let us know if you have not heard from their office within the next week.   If you had any urgent prescriptions sent in today, please check with the pharmacy within an hour of our visit to make sure the prescription was transmitted appropriately.   Please try these tips to maintain a healthy lifestyle:  Eat at least 3 REAL meals and 1-2 snacks per day.  Aim for no more than 5 hours between eating.  If you eat breakfast, please do so within one hour of getting up.   Each meal should contain half fruits/vegetables, one quarter protein, and one quarter carbs (no bigger than a computer mouse)  Cut down on sweet beverages. This includes juice, soda, and sweet tea.   Drink at least 1 glass of water with each meal and aim for at least 8 glasses per day  Exercise at least 150 minutes every week.

## 2022-10-03 NOTE — Assessment & Plan Note (Signed)
Patient has limited access to transportation and has to rely on third-party's to get to and from her appointments.  This does make it difficult for her to pick up prescriptions as well as attend other office visits.  Will place referral to [facility] to see if they can assist with this.

## 2022-10-05 ENCOUNTER — Telehealth: Payer: Self-pay | Admitting: *Deleted

## 2022-10-05 DIAGNOSIS — R0902 Hypoxemia: Secondary | ICD-10-CM | POA: Diagnosis not present

## 2022-10-05 DIAGNOSIS — R531 Weakness: Secondary | ICD-10-CM | POA: Diagnosis not present

## 2022-10-05 DIAGNOSIS — G629 Polyneuropathy, unspecified: Secondary | ICD-10-CM | POA: Diagnosis not present

## 2022-10-05 DIAGNOSIS — G9341 Metabolic encephalopathy: Secondary | ICD-10-CM | POA: Diagnosis not present

## 2022-10-05 DIAGNOSIS — I270 Primary pulmonary hypertension: Secondary | ICD-10-CM | POA: Diagnosis not present

## 2022-10-05 DIAGNOSIS — M6259 Muscle wasting and atrophy, not elsewhere classified, multiple sites: Secondary | ICD-10-CM | POA: Diagnosis not present

## 2022-10-05 DIAGNOSIS — K219 Gastro-esophageal reflux disease without esophagitis: Secondary | ICD-10-CM | POA: Diagnosis not present

## 2022-10-05 DIAGNOSIS — I10 Essential (primary) hypertension: Secondary | ICD-10-CM | POA: Diagnosis not present

## 2022-10-05 DIAGNOSIS — Z9981 Dependence on supplemental oxygen: Secondary | ICD-10-CM | POA: Diagnosis not present

## 2022-10-05 DIAGNOSIS — M1990 Unspecified osteoarthritis, unspecified site: Secondary | ICD-10-CM | POA: Diagnosis not present

## 2022-10-05 DIAGNOSIS — J449 Chronic obstructive pulmonary disease, unspecified: Secondary | ICD-10-CM | POA: Diagnosis not present

## 2022-10-05 DIAGNOSIS — E785 Hyperlipidemia, unspecified: Secondary | ICD-10-CM | POA: Diagnosis not present

## 2022-10-05 DIAGNOSIS — G43909 Migraine, unspecified, not intractable, without status migrainosus: Secondary | ICD-10-CM | POA: Diagnosis not present

## 2022-10-05 DIAGNOSIS — R1312 Dysphagia, oropharyngeal phase: Secondary | ICD-10-CM | POA: Diagnosis not present

## 2022-10-05 DIAGNOSIS — Z9181 History of falling: Secondary | ICD-10-CM | POA: Diagnosis not present

## 2022-10-05 NOTE — Progress Notes (Signed)
  Care Coordination  Outreach Note  10/05/2022 Name: Karla Foster MRN: [id number] DOB: 1955/08/23   Care Coordination Outreach Attempts: An unsuccessful telephone outreach was attempted today to offer the patient information about available care coordination services.  Follow Up Plan:  Additional outreach attempts will be made to offer the patient care coordination information and services.   Encounter Outcome:  No Answer  Burman Nieves, CCMA Care Coordination Care Guide Direct Dial: [phone]

## 2022-10-05 NOTE — Progress Notes (Signed)
  Care Coordination   Note   10/05/2022 Name: Berenis Cenac MRN: [id number] DOB: [birth year]  Alexandria Nerone is a 67 y.o. year old female who sees Ardith Dark, MD for primary care. I reached out to [name] by phone today to offer care coordination services.  Ms. Desmidt was given information about Care Coordination services today including:   The Care Coordination services include support from the care team which includes your Nurse Coordinator, Clinical Social Worker, or Pharmacist.  The Care Coordination team is here to help remove barriers to the health concerns and goals most important to you. Care Coordination services are voluntary, and the patient may decline or stop services at any time by request to their care team member.   Care Coordination Consent Status: Patient agreed to services and verbal consent obtained.   Follow up plan:  Telephone appointment with care coordination team member scheduled for:  10/11/2022  Encounter Outcome:  Pt. Scheduled from referral   Burman Nieves, [facility] Care Coordination Care Guide Direct Dial: [phone]

## 2022-10-11 ENCOUNTER — Telehealth: Payer: Self-pay

## 2022-10-11 DIAGNOSIS — J449 Chronic obstructive pulmonary disease, unspecified: Secondary | ICD-10-CM | POA: Diagnosis not present

## 2022-10-11 DIAGNOSIS — G9341 Metabolic encephalopathy: Secondary | ICD-10-CM | POA: Diagnosis not present

## 2022-10-11 NOTE — Patient Outreach (Signed)
  Care Coordination   10/11/2022 Name: Karla Foster MRN: [id number] DOB: 28-Mar-1955   Care Coordination Outreach Attempts:  An unsuccessful telephone outreach was attempted for a scheduled appointment today.  Follow Up Plan:  Additional outreach attempts will be made to offer the patient care coordination information and services.   Encounter Outcome:  No Answer   Care Coordination Interventions:  No, not indicated    Bevelyn Ngo, BSW, CDP Social Worker, Certified Dementia Practitioner [facility] Care Management  Care Coordination [phone]

## 2022-10-17 ENCOUNTER — Ambulatory Visit (INDEPENDENT_AMBULATORY_CARE_PROVIDER_SITE_OTHER): Payer: 59 | Admitting: *Deleted

## 2022-10-17 DIAGNOSIS — Z23 Encounter for immunization: Secondary | ICD-10-CM

## 2022-10-23 ENCOUNTER — Ambulatory Visit: Payer: Self-pay

## 2022-10-23 NOTE — Patient Instructions (Signed)
Visit Information  Thank you for taking time to visit with me today. Please don't hesitate to contact me if I can be of assistance to you.   Following are the goals we discussed today:  - Work with SW to apply for [employer] transportation  If you are experiencing a Mental Health or [employer] Crisis or need someone to talk to, please call 1-800-273-TALK (toll free, 24 hour hotline) go to [facility] [address], [place] [phone]) call 911  Patient verbalizes understanding of instructions and care plan provided today and agrees to view in MyChart. Active MyChart status and patient understanding of how to access instructions and care plan via MyChart confirmed with patient.     Bevelyn Ngo, BSW, CDP Social Worker, Certified Dementia Practitioner [facility] Care Management  Care Coordination [phone]

## 2022-10-23 NOTE — Patient Outreach (Signed)
  Care Coordination   Initial Visit Note   10/23/2022 Name: Karla Foster MRN: [id number] DOB: [birth year]  Karla Foster is a 67 y.o. year old female who sees Karla Dark, MD for primary care. I spoke with  Karla Foster by phone today.  What matters to the patients health and wellness today?  Identify resources to assist with transportation    Goals Addressed             This Visit's Progress    Care Coordination Activities       Care Coordination Interventions: Discussed the patient has difficulty with transportation. She currently relies on her sister who is not always available to provide transportation Performed chart review to note patient has UHC - advised some UHC plans offer transportation. Patient reports she has tried to use health plan transportation in the past and found it to be unreliable Educated the patient on [employer] (formerly SCAT) - patient is interested in applying for this resource Discussed plan for SW to complete Part A on behalf of the patient; SW to collaborate with patients primary care provider to obtain Part B         SDOH assessments and interventions completed:  No     Care Coordination Interventions:  Yes, provided   Interventions Today    Flowsheet Row Most Recent Value  Chronic Disease   Chronic disease during today's visit Chronic Obstructive Pulmonary Disease (COPD)  General Interventions   General Interventions Discussed/Reviewed General Interventions Discussed, [employer]  [Discussed options for transportation]        Follow up plan:  SW will continue to follow    Encounter Outcome:  Pt. Visit Completed   Bevelyn Ngo, Kenard Gower, CDP Social Worker, Certified Dementia Practitioner [facility] Care Management  Care Coordination [phone]

## 2022-10-24 ENCOUNTER — Telehealth: Payer: Self-pay | Admitting: Family Medicine

## 2022-10-24 NOTE — Telephone Encounter (Signed)
Received faxed document professional verification form   , to be filled out by provider. Patient requested to send it back via Fax within 5-days. Document is located in providers tray at front office.Please advise

## 2022-10-24 NOTE — Telephone Encounter (Signed)
Form placed to be reviewed in PCP office

## 2022-10-24 NOTE — Patient Outreach (Signed)
  Care Coordination   Documentation  Note   10/24/2022 Name: Karla Foster MRN: [id number] DOB: Sep 09, 1955  Karla Foster is a 67 y.o. year old female who sees Ardith Dark, MD for primary care. I  initiated an [employer] application on behalf of the patient.  What matters to the patients health and wellness today?  SW did not speak with the patient today.   SDOH assessments and interventions completed:  No     Care Coordination Interventions:  Yes, provided   Interventions Today    Flowsheet Row Most Recent Value  Chronic Disease   Chronic disease during today's visit Chronic Obstructive Pulmonary Disease (COPD)  General Interventions   General Interventions Discussed/Reviewed Communication with, [employer] and submitted Part A of SCAT application to [employer] [place]]  Communication with PCP/Specialists  [Initiated Part B of SCAT application, faxed to providers office for completion]        Follow up plan:  SW will continue to follow.    Encounter Outcome:  Patient Visit Completed   Bevelyn Ngo, Kenard Gower, CDP Social Worker, Certified Dementia Practitioner [facility] Care Management  Care Coordination [phone]

## 2022-10-30 NOTE — Telephone Encounter (Signed)
Form faxed to [phone] Copy placed to be scan in patient chart

## 2022-10-30 NOTE — Patient Outreach (Signed)
  Care Coordination   Documentation  Note   10/30/2022 Name: Rozetta Vanhook MRN: [id number] DOB: Nov 29, 1955  Cj Platek is a 67 y.o. year old female who sees Ardith Dark, MD for primary care. I  collaborated with [employer] to submit patients completed application for transportation.  What matters to the patients health and wellness today?  SW did not speak with the patient today.   SDOH assessments and interventions completed:  No     Care Coordination Interventions:  Yes, provided   Interventions Today    Flowsheet Row Most Recent Value  Chronic Disease   Chronic disease during today's visit Chronic Obstructive Pulmonary Disease (COPD)  General Interventions   General Interventions Discussed/Reviewed [employer]  [Received completed Part B of Careers information officer from providers office. Submitted to [facility] Access for review]        Follow up plan:  SW will continue to follow.    Encounter Outcome:  Patient Visit Completed   Bevelyn Ngo, Kenard Gower, CDP Social Worker, Certified Dementia Practitioner [facility] Care Management  Care Coordination [phone]

## 2022-11-02 ENCOUNTER — Ambulatory Visit: Payer: Self-pay

## 2022-11-02 NOTE — Patient Outreach (Signed)
Care Coordination   Follow Up Visit Note   11/02/2022 Name: Karla Foster MRN: [id number] DOB: 1956/02/15  Karla Foster is a 67 y.o. year old female who sees Ardith Dark, MD for primary care. I spoke with  Karla Foster by phone today.  What matters to the patients health and wellness today?  SW and patient discussed [employer] application has been submitted. The patient is aware she will receive notification via mail if approved.    SDOH assessments and interventions completed:  No     Care Coordination Interventions:  Yes, provided   Interventions Today    Flowsheet Row Most Recent Value  Chronic Disease   Chronic disease during today's visit Chronic Obstructive Pulmonary Disease (COPD)  General Interventions   General Interventions Discussed/Reviewed [employer] the patient to advise her [employer] application as been submitted.]        Follow up plan:  SW will continue to follow    Encounter Outcome:  Patient Visit Completed   Bevelyn Ngo, BSW, CDP [facility]  [facility], [facility] Social Worker Direct Dial: [phone]  Fax: [phone]

## 2022-11-02 NOTE — Patient Instructions (Signed)
Visit Information  Thank you for taking time to visit with me today. Please don't hesitate to contact me if I can be of assistance to you.   Following are the goals we discussed today:  - Continue working with SW to obtain transportation resources   If you are experiencing a Mental Health or Behavioral Health Crisis or need someone to talk to, please call 1-800-273-TALK (toll free, 24 hour hotline) go to [facility] [address], [place] [phone]) call 911  Patient verbalizes understanding of instructions and care plan provided today and agrees to view in MyChart. Active MyChart status and patient understanding of how to access instructions and care plan via MyChart confirmed with patient.     Bevelyn Ngo, BSW, CDP [facility]  [facility], [facility] Social Worker Direct Dial: [phone]  Fax: [phone]

## 2022-11-11 DIAGNOSIS — J449 Chronic obstructive pulmonary disease, unspecified: Secondary | ICD-10-CM | POA: Diagnosis not present

## 2022-11-11 DIAGNOSIS — G9341 Metabolic encephalopathy: Secondary | ICD-10-CM | POA: Diagnosis not present

## 2022-12-11 DIAGNOSIS — G9341 Metabolic encephalopathy: Secondary | ICD-10-CM | POA: Diagnosis not present

## 2022-12-11 DIAGNOSIS — J449 Chronic obstructive pulmonary disease, unspecified: Secondary | ICD-10-CM | POA: Diagnosis not present

## 2022-12-13 NOTE — Patient Outreach (Signed)
  Care Coordination   Documentation  Note   12/13/2022 Name: Shaneiqua Matalon MRN: [id number] DOB: April 27, 1955  Bahareh Denisco is a 67 y.o. year old female who sees Ardith Dark, MD for primary care. I  Collaborated with Lorretta Harp from [employer] to follow up on the status of patients SCAT application. Mrs. Rorie indicates she has mailed the patient a letter requesting additional information in order to process this application. SW attempted to follow up with the patient but was unsuccessful. SW left a HIPAA compliant voice message requesting a return call.  What matters to the patients health and wellness today?  SW did not speak with the patient today.    SDOH assessments and interventions completed:  No     Care Coordination Interventions:  Yes, provided   Interventions Today    Flowsheet Row Most Recent Value  General Interventions   General Interventions Discussed/Reviewed Community Resources  [Determined the patients SCAT application has not yet been processed as more information is needed. Attempted to contact the patient to determine if SW assistance still needed. VM left requesting return call.]        Follow up plan:  SW will continue to follow to assist with transportation barriers.    Encounter Outcome:  Patient Visit Completed   Bevelyn Ngo, BSW, CDP [facility]  [facility], [facility] Social Worker Direct Dial: [phone]  Fax: [phone]

## 2022-12-20 ENCOUNTER — Telehealth: Payer: Self-pay | Admitting: Family Medicine

## 2022-12-20 NOTE — Telephone Encounter (Signed)
Received faxed  document gso applicant form , to be filled out by provider. Patient requested to send it back via Fax within 7-days. Document is located in providers tray at front office.Please advise.

## 2022-12-21 ENCOUNTER — Ambulatory Visit: Payer: Self-pay | Admitting: Licensed Clinical Social Worker

## 2022-12-21 NOTE — Patient Outreach (Signed)
  Care Coordination   12/21/2022 Name: Karla Foster MRN: [id number] DOB: 1956/02/11   Care Coordination Outreach Attempts:  An unsuccessful telephone outreach was attempted for a scheduled appointment today.  Follow Up Plan:  Additional outreach attempts will be made to offer the patient care coordination information and services.   Encounter Outcome:  No Answer   Care Coordination Interventions:  No, not indicated    Jeanie Cooks, PhD [facility], [facility] Social Worker Direct Dial: [phone]  Fax: [phone]

## 2022-12-21 NOTE — Telephone Encounter (Signed)
Placed for to be reviewed on PCP desk

## 2022-12-25 ENCOUNTER — Ambulatory Visit: Payer: Self-pay | Admitting: Licensed Clinical Social Worker

## 2022-12-25 NOTE — Patient Outreach (Signed)
  Care Coordination   12/25/2022 Name: Karla Foster MRN: [id number] DOB: [birth year]   Care Coordination Outreach Attempts:  A second unsuccessful outreach was attempted today to offer the patient with information about available care coordination services.  Follow Up Plan:  Additional outreach attempts will be made to offer the patient care coordination information and services.   Encounter Outcome:  No Answer   Care Coordination Interventions:  No, not indicated    Jeanie Cooks, PhD [facility], [facility] Social Worker Direct Dial: [phone]  Fax: [phone]

## 2022-12-25 NOTE — Patient Instructions (Signed)
Visit Information  Thank you for taking time to visit with me today. Please don't hesitate to contact me if I can be of assistance to you.   Following are the goals we discussed today:   Goals Addressed             This Visit's Progress    Care Coordination Activities       Care Coordination Interventions: Patient is still waiting on the approval for a SCAT application for transportation, SW wil be following up on the application and then will follow up with the patient          Our next appointment is by telephone on 01/03/2023 at 3:15 pm  Please call the care guide team at [phone] if you need to cancel or reschedule your appointment.   If you are experiencing a Mental Health or Behavioral Health Crisis or need someone to talk to, please call the Suicide and Crisis Lifeline: 988 go to [facility] Urgent [facility] [address], [place] [phone]) call 911  Patient verbalizes understanding of instructions and care plan provided today and agrees to view in MyChart. Active MyChart status and patient understanding of how to access instructions and care plan via MyChart confirmed with patient.     Jeanie Cooks, PhD [facility], [facility] Social Worker Direct Dial: [phone]  Fax: [phone]

## 2022-12-25 NOTE — Patient Outreach (Signed)
  Care Coordination   Follow Up Visit Note   12/25/2022 Name: Karla Foster MRN: [id number] DOB: 18-Nov-1955  Karla Foster is a 67 y.o. year old female who sees Ardith Dark, MD for primary care. I spoke with  Karla Foster by phone today.  What matters to the patients health and wellness today?  Pending SCAT application     Goals Addressed             This Visit's Progress    Care Coordination Activities       Care Coordination Interventions: Patient is still waiting on the approval for a SCAT application for transportation, SW wil be following up on the application and then will follow up with the patient          SDOH assessments and interventions completed:  Yes  SDOH Interventions Today    Flowsheet Row Most Recent Value  SDOH Interventions   Food Insecurity Interventions Intervention Not Indicated  Housing Interventions Intervention Not Indicated  Transportation Interventions Other (Comment), SCAT (Specialized Community Area Transporation)  Patent examiner has a pending SCAT application]  Utilities Interventions Intervention Not Indicated  Financial Strain Interventions Intervention Not Indicated        Care Coordination Interventions:  Yes, provided  Interventions Today    Flowsheet Row Most Recent Value  General Interventions   General Interventions Discussed/Reviewed General Interventions Discussed, General Interventions Reviewed, [employer] has a pendign SCAT application]        Follow up plan: Follow up call scheduled for 01/03/2023 at 3:15pm    Encounter Outcome:  Patient Visit Completed   Jeanie Cooks, PhD [facility], [facility] Social Worker Direct Dial: [phone]  Fax: [phone]

## 2022-12-28 NOTE — Telephone Encounter (Signed)
Form Faxed to [phone] Placed to be scan in patient chart

## 2023-01-03 ENCOUNTER — Ambulatory Visit: Payer: Self-pay | Admitting: Licensed Clinical Social Worker

## 2023-01-03 NOTE — Patient Instructions (Signed)
Visit Information  Thank you for taking time to visit with me today. Please don't hesitate to contact me if I can be of assistance to you.   Following are the goals we discussed today:   Goals Addressed             This Visit's Progress    Care Coordination Activities       Care Coordination Interventions: Patient is still waiting on the approval for a SCAT application for transportation, SW wil be following up on the application and then will follow up with the patient  SCAT is pending          Our next appointment is by telephone on 01/14/2023 at 1:00 pm  Please call the care guide team at [phone] if you need to cancel or reschedule your appointment.   If you are experiencing a Mental Health or Behavioral Health Crisis or need someone to talk to, please call the Suicide and Crisis Lifeline: 988 go to [facility] Urgent [facility] [address] [place] [phone]) call 911  Patient verbalizes understanding of instructions and care plan provided today and agrees to view in MyChart. Active MyChart status and patient understanding of how to access instructions and care plan via MyChart confirmed with patient.     Jeanie Cooks, PhD [facility], [facility] Social Worker Direct Dial: [phone]  Fax: [phone]

## 2023-01-03 NOTE — Patient Outreach (Signed)
  Care Coordination   Follow Up Visit Note   01/03/2023 Name: Tahjai Brockhaus MRN: [id number] DOB: 08-Jul-1955  Shirel Madej is a 67 y.o. year old female who sees Ardith Dark, MD for primary care. I spoke with  [name] by phone today.  What matters to the patients health and wellness today?  SCAT Application, still pending     Goals Addressed             This Visit's Progress    Care Coordination Activities       Care Coordination Interventions: Patient is still waiting on the approval for a SCAT application for transportation, SW wil be following up on the application and then will follow up with the patient  SCAT is pending          SDOH assessments and interventions completed:  Yes  SDOH Interventions Today    Flowsheet Row Most Recent Value  SDOH Interventions   Transportation Interventions SCAT (Specialized Community Area Transporation), Other (Comment)  [PT is still waiting on the SCAT application, SW is following up with the SCAT office]        Care Coordination Interventions:  Yes, provided  Interventions Today    Flowsheet Row Most Recent Value  General Interventions   General Interventions Discussed/Reviewed General Interventions Discussed, [employer] is still waiting on the SCAT application, SW is following up on the Application]        Follow up plan: Follow up call scheduled for 01/14/2023 at 1:00 pm    Encounter Outcome:  Patient Visit Completed   Jeanie Cooks, PhD [facility], [facility] Social Worker Direct Dial: [phone]  Fax: [phone]

## 2023-01-04 ENCOUNTER — Telehealth: Payer: Self-pay

## 2023-01-04 NOTE — Telephone Encounter (Signed)
Pt came in the office with husband for a nurse visit and needed assistance resetting password for mychart and logging in. Assisted patient and now able to access her Mychart

## 2023-01-11 DIAGNOSIS — J449 Chronic obstructive pulmonary disease, unspecified: Secondary | ICD-10-CM | POA: Diagnosis not present

## 2023-01-11 DIAGNOSIS — G9341 Metabolic encephalopathy: Secondary | ICD-10-CM | POA: Diagnosis not present

## 2023-01-14 ENCOUNTER — Ambulatory Visit: Payer: Self-pay | Admitting: Licensed Clinical Social Worker

## 2023-01-14 NOTE — Patient Outreach (Signed)
  Care Coordination   Follow Up Visit Note   01/14/2023 Name: Karla Foster MRN: [id number] DOB: 01/23/1956  Karla Foster is a 67 y.o. year old female who sees Ardith Dark, MD for primary care. I spoke with  Karla Foster by phone today.  What matters to the patients health and wellness today?  Follow up on SCAT application     Goals Addressed             This Visit's Progress    Care Coordination Activities       Care Coordination Interventions: Patient is still waiting on the approval for a SCAT application for transportation, SW wil be following up on the application and then will follow up with the patient . Patient requested that the SW call her back in a couple of weeks. The patient stated that her siblings obtain her mail for her but not on a daily basis.  SCAT is pending Sw will follow back up with the patient on 01/29/2023 at 1:00 pm         SDOH assessments and interventions completed:  Yes  SDOH Interventions Today    Flowsheet Row Most Recent Value  SDOH Interventions   Transportation Interventions SCAT (Specialized Community Area Transporation), Other (Comment)  [Pending SCAT application]        Care Coordination Interventions:  Yes, provided  Interventions Today    Flowsheet Row Most Recent Value  General Interventions   General Interventions Discussed/Reviewed General Interventions Discussed, General Interventions Reviewed, [employer] has a pending SCAT application]        Follow up plan: Follow up call scheduled for 01/29/2023 at 1:00 pm    Encounter Outcome:  Patient Visit Completed   Jeanie Cooks, PhD [facility], [facility] Social Worker Direct Dial: [phone]  Fax: [phone]

## 2023-01-14 NOTE — Patient Instructions (Signed)
Visit Information  Thank you for taking time to visit with me today. Please don't hesitate to contact me if I can be of assistance to you.   Following are the goals we discussed today:   Goals Addressed             This Visit's Progress    Care Coordination Activities       Care Coordination Interventions: Patient is still waiting on the approval for a SCAT application for transportation, SW wil be following up on the application and then will follow up with the patient . Patient requested that the SW call her back in a couple of weeks. The patient stated that her siblings obtain her mail for her but not on a daily basis.  SCAT is pending Sw will follow back up with the patient on 01/29/2023 at 1:00 pm         Our next appointment is by telephone on 01/29/2023 at 1:00 pm  Please call the care guide team at [phone] if you need to cancel or reschedule your appointment.   If you are experiencing a Mental Health or Behavioral Health Crisis or need someone to talk to, please call the Suicide and Crisis Lifeline: 988 go to [facility] Urgent [facility] [address] [place] [phone]) call 911  Patient verbalizes understanding of instructions and care plan provided today and agrees to view in MyChart. Active MyChart status and patient understanding of how to access instructions and care plan via MyChart confirmed with patient.     Jeanie Cooks, PhD [facility], [facility] Social Worker Direct Dial: [phone]  Fax: [phone]

## 2023-01-29 ENCOUNTER — Ambulatory Visit: Payer: Self-pay | Admitting: Licensed Clinical Social Worker

## 2023-01-29 NOTE — Patient Instructions (Signed)
Visit Information  Thank you for taking time to visit with me today. Please don't hesitate to contact me if I can be of assistance to you.   Following are the goals we discussed today:   Goals Addressed             This Visit's Progress    COMPLETED: Care Coordination Activities       Care Coordination Interventions: Patient is still waiting on the approval for a SCAT application for transportation, SW wil be following up on the application and then will follow up with the patient . Patient requested that the SW call her back in a couple of weeks. The patient stated that her siblings obtain her mail for her but not on a daily basis.  SCAT is pending Sw will follow back up with the patient on 01/29/2023 at 1:00 pm        No further follow up needed  Please call the care guide team at [phone] if you need to cancel or reschedule your appointment.   If you are experiencing a Mental Health or Behavioral Health Crisis or need someone to talk to, please call the Suicide and Crisis Lifeline: 988 go to [facility] Urgent [facility] [address] [place] [phone]) call 911  Patient verbalizes understanding of instructions and care plan provided today and agrees to view in MyChart. Active MyChart status and patient understanding of how to access instructions and care plan via MyChart confirmed with patient.     Jeanie Cooks, PhD [facility], [facility] Social Worker Direct Dial: [phone]  Fax: [phone]

## 2023-01-29 NOTE — Patient Outreach (Signed)
  Care Coordination   Follow Up Visit Note   01/29/2023 Name: Azaylee Kijek MRN: [id number] DOB: August 04, 1955  Hai Nardini is a 67 y.o. year old female who sees Ardith Dark, MD for primary care. I spoke with  [name] by phone today.  What matters to the patients health and wellness today?  SCAT Application    Goals Addressed             This Visit's Progress    COMPLETED: Care Coordination Activities       Care Coordination Interventions: Patient is still waiting on the approval for a SCAT application for transportation, SW wil be following up on the application and then will follow up with the patient . Patient requested that the SW call her back in a couple of weeks. The patient stated that her siblings obtain her mail for her but not on a daily basis.  SCAT is pending Sw will follow back up with the patient on 01/29/2023 at 1:00 pm         SDOH assessments and interventions completed:  Yes  SDOH Interventions Today    Flowsheet Row Most Recent Value  SDOH Interventions   Food Insecurity Interventions Intervention Not Indicated  Housing Interventions Intervention Not Indicated  Transportation Interventions Intervention Not Indicated  Utilities Interventions Intervention Not Indicated        Care Coordination Interventions:  Yes, provided  Interventions Today    Flowsheet Row Most Recent Value  General Interventions   General Interventions Discussed/Reviewed General Interventions Reviewed, Community Resources  [SCAT]       Follow up plan: No further intervention required.   Encounter Outcome:  Patient Visit Completed   Jeanie Cooks, PhD [facility], [facility] Social Worker Direct Dial: [phone]  Fax: [phone]

## 2023-02-08 DIAGNOSIS — H2513 Age-related nuclear cataract, bilateral: Secondary | ICD-10-CM | POA: Diagnosis not present

## 2023-02-10 DIAGNOSIS — G9341 Metabolic encephalopathy: Secondary | ICD-10-CM | POA: Diagnosis not present

## 2023-02-10 DIAGNOSIS — J449 Chronic obstructive pulmonary disease, unspecified: Secondary | ICD-10-CM | POA: Diagnosis not present

## 2023-02-21 ENCOUNTER — Telehealth: Payer: Self-pay

## 2023-02-21 NOTE — Telephone Encounter (Signed)
 Mrs. Karla Foster came into the office with her husband who had a nurse visit. She asked if I could help her with her MyChart password. I reset her password to a temporary word that she will know to change it to something stronger to remember

## 2023-03-13 DIAGNOSIS — J449 Chronic obstructive pulmonary disease, unspecified: Secondary | ICD-10-CM | POA: Diagnosis not present

## 2023-03-13 DIAGNOSIS — G9341 Metabolic encephalopathy: Secondary | ICD-10-CM | POA: Diagnosis not present

## 2023-04-08 ENCOUNTER — Ambulatory Visit: Payer: 59 | Admitting: Family Medicine

## 2023-04-13 DIAGNOSIS — G9341 Metabolic encephalopathy: Secondary | ICD-10-CM | POA: Diagnosis not present

## 2023-04-13 DIAGNOSIS — J449 Chronic obstructive pulmonary disease, unspecified: Secondary | ICD-10-CM | POA: Diagnosis not present

## 2023-04-30 ENCOUNTER — Other Ambulatory Visit: Payer: Self-pay | Admitting: Family Medicine

## 2023-04-30 ENCOUNTER — Telehealth: Payer: Self-pay | Admitting: Family Medicine

## 2023-04-30 NOTE — Telephone Encounter (Signed)
 Patient's relative dropped off document FL2, to be filled out by provider. Patient requested to send it back via Call Patient to pick up within 5-days. Document is located in providers tray at front office.Please advise at  [phone], [facility].

## 2023-04-30 NOTE — Telephone Encounter (Signed)
 Pt requesting refill for Clonazepam 1 mg. Last OV 10/03/2022.

## 2023-05-02 NOTE — Telephone Encounter (Signed)
Form placed to be reviewed in PCP office

## 2023-05-06 NOTE — Telephone Encounter (Signed)
 LVM FL2 form ready to be pick up, placed at front office  PPD form not sign, need to schedule a nurse visit or lab visit

## 2023-05-11 DIAGNOSIS — G9341 Metabolic encephalopathy: Secondary | ICD-10-CM | POA: Diagnosis not present

## 2023-05-11 DIAGNOSIS — J449 Chronic obstructive pulmonary disease, unspecified: Secondary | ICD-10-CM | POA: Diagnosis not present

## 2023-05-14 ENCOUNTER — Telehealth: Payer: Self-pay | Admitting: *Deleted

## 2023-05-14 NOTE — Telephone Encounter (Signed)
 Copied from CRM [id number]. Topic: Clinical - Request for Lab/Test Order >> May 14, 2023 11:31 AM Mackie Pai E wrote: Reason for CRM: Patient called in stating that she is moving into an assisted living facility, [facility], and the facility is requiring her to have a chest x-ray. Asher Muir, from that assisted living facility, was also on the line and stated to call him back to get this set up. Callback number for Asher Muir is [phone] to discuss.   Please advise  Daniela Hernan,RMA

## 2023-05-15 ENCOUNTER — Ambulatory Visit (INDEPENDENT_AMBULATORY_CARE_PROVIDER_SITE_OTHER)

## 2023-05-15 ENCOUNTER — Other Ambulatory Visit

## 2023-05-15 ENCOUNTER — Other Ambulatory Visit: Payer: Self-pay | Admitting: *Deleted

## 2023-05-15 DIAGNOSIS — Z111 Encounter for screening for respiratory tuberculosis: Secondary | ICD-10-CM | POA: Diagnosis not present

## 2023-05-15 DIAGNOSIS — I700 Atherosclerosis of aorta: Secondary | ICD-10-CM | POA: Diagnosis not present

## 2023-05-15 NOTE — Telephone Encounter (Signed)
 X-ray order, please call patient for appt

## 2023-05-15 NOTE — Telephone Encounter (Signed)
 Ok with me. Please place any necessary orders.

## 2023-05-16 ENCOUNTER — Encounter: Payer: Self-pay | Admitting: Family Medicine

## 2023-05-16 NOTE — Progress Notes (Signed)
 Chest xray shows no signs  of TB.

## 2023-05-22 ENCOUNTER — Encounter

## 2023-05-22 NOTE — Progress Notes (Signed)
 This encounter was created in error - please disregard.

## 2023-05-22 NOTE — Progress Notes (Signed)
 Pt needs to reschedule

## 2023-05-23 ENCOUNTER — Ambulatory Visit: Payer: Self-pay

## 2023-05-23 NOTE — Telephone Encounter (Signed)
 Noted.

## 2023-05-23 NOTE — Telephone Encounter (Signed)
  Chief Complaint: suicidal Symptoms: expressed that she has had thougths "off and on" about cutting wrists. Sttated that "I am too young to live here. I I've in 1 room and my husband is 15 years older and he has signs of dementia. Pt stated "I can't leave him alone." Frequency: had  Suicidal thoughts when she moved there and now Pertinent Negatives: Patient denies call was dropped- unable to reach pt back after multiple call back) Disposition: [x] ED /[] Urgent Care (no appt availability in office) / [] Appointment(In office/virtual)/ []  [place] [facility]/ [] Home Care/ [] Refused Recommended Disposition /[] [facility]/ []  Follow-up with PCP Additional Notes: Unable to hear pt on phone could not tell if she was still on the line- called pt back several times Left VM advising calling 911 for her . called 911 for a wellness check.  Copied from CRM [id number]. Topic: Clinical - Red Word Triage >> May 23, 2023 11:51 AM Denese Killings wrote: Red Word that prompted transfer to Nurse Triage: Patient is suicidal. Reason for Disposition . Patient is threatening suicide now  Answer Assessment - Initial Assessment Questions 1. MAIN CONCERN: "What happened that made you call today?"     Husband with dementia in [facility] " I hate it" stated afraid gonna - too young Upset about being moved in to assisted living, stated she was too young for this place and that she hated it there   2. RISK OF HARM - SUICIDAL IDEATION:  "Do you ever have thoughts of hurting or killing yourself?"  (e.g., yes, no, no but preoccupation with thoughts about death)   - WISH TO BE DEAD:  "Have you wished you were dead or wished you could go to sleep and not wake up?"   - INTENT:  "Have you had any thoughts of hurting or killing yourself?" (e.g., yes, no, [name]) If Yes, ask: "Are you having these thoughts about killing yourself right NOW?"   - PLAN: "Have you thought about how you might do this?" "Do you have a  specific plan for how you would do this?" (e.g., gun, knife, overdose, no plan, [name])   - ACCESS: If yes to PLAN, "Do you have access to kinives, pills or guns?" (e.g., pills, gun in house, knife in kitchen)     Asked pt and pt  didn't answer 3. RISK OF HARM - SUICIDE ATTEMPT: "Have you tried to harm yourself recently?" If Yes, ask: When was this?"  "What type of harm was tried?"     Call dropped 5. 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 FUNCTIONAL IMPAIRMENT: "How have things been going for you overall? Have you had more difficulty than usual doing your normal daily activities?"  (e.g., better, same, worse; self-care, school, work, interactions)  Protocols used: Suicide Concerns-A-AH

## 2023-05-23 NOTE — Telephone Encounter (Signed)
 FYI-patient had returned call prior to EMS arrival, EMS arrived while speaking with agent.   Copied from CRM [id number]. Topic: Clinical - Red Word Triage >> May 23, 2023 12:31 PM Sasha H wrote: Red Word that prompted transfer to Nurse Triage: Pt was on the phone with a nurse due to being suicidal. Pt called back in and got me on the line, as I sat on the phone with the pt, going through her chart to figure out which nurse had her before me, EMS walked in and asked did she call 911, pt stated "my drs office called" and pt disconnected the call.

## 2023-05-23 NOTE — Telephone Encounter (Signed)
 Noted, see previews call

## 2023-05-23 NOTE — Telephone Encounter (Signed)
 Noted. Agree with dispo. 911 has been called for wellness check. Recommend she be evaluated at the Emergency Department ASAP.   Katina Degree. Jimmey Ralph, MD 05/23/2023 1:08 PM

## 2023-06-11 DIAGNOSIS — J449 Chronic obstructive pulmonary disease, unspecified: Secondary | ICD-10-CM | POA: Diagnosis not present

## 2023-06-11 DIAGNOSIS — G9341 Metabolic encephalopathy: Secondary | ICD-10-CM | POA: Diagnosis not present

## 2023-06-18 ENCOUNTER — Emergency Department (HOSPITAL_BASED_OUTPATIENT_CLINIC_OR_DEPARTMENT_OTHER)
Admission: EM | Admit: 2023-06-18 | Discharge: 2023-06-18 | Disposition: A | Discharge status: Home / Self Care | Attending: Emergency Medicine | Admitting: Emergency Medicine

## 2023-06-18 ENCOUNTER — Other Ambulatory Visit: Payer: Self-pay | Admitting: *Deleted

## 2023-06-18 ENCOUNTER — Other Ambulatory Visit: Payer: Self-pay

## 2023-06-18 ENCOUNTER — Telehealth: Payer: Self-pay | Admitting: *Deleted

## 2023-06-18 DIAGNOSIS — M545 Low back pain, unspecified: Secondary | ICD-10-CM

## 2023-06-18 DIAGNOSIS — R110 Nausea: Secondary | ICD-10-CM | POA: Diagnosis not present

## 2023-06-18 DIAGNOSIS — R6889 Other general symptoms and signs: Secondary | ICD-10-CM | POA: Diagnosis not present

## 2023-06-18 DIAGNOSIS — M549 Dorsalgia, unspecified: Secondary | ICD-10-CM | POA: Diagnosis not present

## 2023-06-18 DIAGNOSIS — S32010D Wedge compression fracture of first lumbar vertebra, subsequent encounter for fracture with routine healing: Secondary | ICD-10-CM

## 2023-06-18 DIAGNOSIS — M546 Pain in thoracic spine: Secondary | ICD-10-CM | POA: Insufficient documentation

## 2023-06-18 DIAGNOSIS — M5459 Other low back pain: Secondary | ICD-10-CM | POA: Diagnosis not present

## 2023-06-18 DIAGNOSIS — Z743 Need for continuous supervision: Secondary | ICD-10-CM | POA: Diagnosis not present

## 2023-06-18 LAB — URINALYSIS, ROUTINE W REFLEX MICROSCOPIC
Bilirubin Urine: NEGATIVE
Glucose, UA: NEGATIVE mg/dL
Hgb urine dipstick: NEGATIVE
Ketones, ur: NEGATIVE mg/dL
Leukocytes,Ua: NEGATIVE
Nitrite: NEGATIVE
Protein, ur: NEGATIVE mg/dL
Specific Gravity, Urine: 1.014 (ref 1.005–1.030)
pH: 5.5 (ref 5.0–8.0)

## 2023-06-18 LAB — COMPREHENSIVE METABOLIC PANEL WITH GFR
ALT: 6 U/L (ref 0–44)
AST: 18 U/L (ref 15–41)
Albumin: 3.7 g/dL (ref 3.5–5.0)
Alkaline Phosphatase: 89 U/L (ref 38–126)
Anion gap: 9 (ref 5–15)
BUN: 11 mg/dL (ref 8–23)
CO2: 27 mmol/L (ref 22–32)
Calcium: 9.5 mg/dL (ref 8.9–10.3)
Chloride: 104 mmol/L (ref 98–111)
Creatinine, Ser: 0.81 mg/dL (ref 0.44–1.00)
GFR, Estimated: >60 mL/min (ref >60)
Glucose, Bld: 93 mg/dL (ref 70–99)
Potassium: 4.2 mmol/L (ref 3.5–5.1)
Sodium: 140 mmol/L (ref 135–145)
Total Bilirubin: 0.3 mg/dL (ref 0.0–1.2)
Total Protein: 6.2 g/dL — ABNORMAL LOW (ref 6.5–8.1)

## 2023-06-18 LAB — CBC WITH DIFFERENTIAL/PLATELET
Abs Immature Granulocytes: 0.02 10*3/uL (ref 0.00–0.07)
Basophils Absolute: 0.1 10*3/uL (ref 0.0–0.1)
Basophils Relative: 1 %
Eosinophils Absolute: 0.1 10*3/uL (ref 0.0–0.5)
Eosinophils Relative: 1 %
HCT: 37.3 % (ref 36.0–46.0)
Hemoglobin: 12.1 g/dL (ref 12.0–15.0)
Immature Granulocytes: 0 %
Lymphocytes Relative: 22 %
Lymphs Abs: 2 10*3/uL (ref 0.7–4.0)
MCH: 32.6 pg (ref 26.0–34.0)
MCHC: 32.4 g/dL (ref 30.0–36.0)
MCV: 100.5 fL — ABNORMAL HIGH (ref 80.0–100.0)
Monocytes Absolute: 0.8 10*3/uL (ref 0.1–1.0)
Monocytes Relative: 8 %
Neutro Abs: 6.3 10*3/uL (ref 1.7–7.7)
Neutrophils Relative %: 68 %
Platelets: 229 10*3/uL (ref 150–400)
RBC: 3.71 MIL/uL — ABNORMAL LOW (ref 3.87–5.11)
RDW: 13.8 % (ref 11.5–15.5)
WBC: 9.2 10*3/uL (ref 4.0–10.5)
nRBC: 0 % (ref 0.0–0.2)

## 2023-06-18 MED ORDER — HYDROCODONE-ACETAMINOPHEN 5-325 MG PO TABS
1.0000 | ORAL_TABLET | Freq: Once | ORAL | Status: AC
  Administered 2023-06-18: 1 via ORAL
  Filled 2023-06-18: qty 1

## 2023-06-18 MED ORDER — HYDROCODONE-ACETAMINOPHEN 5-325 MG PO TABS
1.0000 | ORAL_TABLET | Freq: Once | ORAL | Status: AC
  Administered 2023-06-18: 1 via ORAL
  Filled 2023-06-18 (×2): qty 1

## 2023-06-18 MED ORDER — METHOCARBAMOL 500 MG PO TABS: 500.0000 mg | ORAL_TABLET | Freq: Two times a day (BID) | ORAL | 0 refills | Status: DC

## 2023-06-18 MED ORDER — METHOCARBAMOL 500 MG PO TABS
500.0000 mg | ORAL_TABLET | Freq: Once | ORAL | Status: AC
  Administered 2023-06-18: 500 mg via ORAL
  Filled 2023-06-18: qty 1

## 2023-06-18 MED ORDER — HYDROCODONE-ACETAMINOPHEN 5-325 MG PO TABS: 1.0000 | ORAL_TABLET | ORAL | 0 refills | Status: DC | PRN

## 2023-06-18 NOTE — Telephone Encounter (Signed)
 Copied from CRM [id number]. Topic: Referral - Request for Referral >> Jun 18, 2023  8:45 AM Marlan Silva wrote: Did the patient discuss referral with their provider in the last year? Yes (If No - schedule appointment) (If Yes - send message)  Appointment offered? Yes  Type of order/referral and detailed reason for visit: patient can't stand up she said she can't bend over.  Preference of office, provider, location: Dr. Herlene Lone (Back Specialist)  If referral order, have you been seen by this specialty before? No (If Yes, this issue or another issue? When? Where?  Can we respond through MyChart? Yes  Patient is requesting a call back to ensure if the referral has been placed.    Ok to order referral? [facility]

## 2023-06-18 NOTE — Telephone Encounter (Signed)
 Ok with me. Please place any necessary orders.

## 2023-06-18 NOTE — ED Provider Notes (Signed)
 [place] EMERGENCY DEPARTMENT AT [facility] Provider Note   CSN: [id number] Arrival date & time: 06/18/23  1544     History  Chief Complaint  Patient presents with   Back Pain    Karla Foster is a 68 y.o. female.  Patient to ED with pain in the mid- to lower back for the past 2 weeks. She has had surgery in the remote past with fusion from lower thoracic to L3 level and also a remote L1 compression fracture. She denies any falls or injury. No urinary symptoms or fever. She reports nausea without vomiting for the same 2 week period. No abdominal pain. No radiation of the pain distally or proximally. No numbness, or weakness.   The history is provided by the patient. No language interpreter was used.  Back Pain      Home Medications Prior to Admission medications   Medication Sig Start Date End Date Taking? Authorizing Provider  methocarbamol (ROBAXIN) 500 MG tablet Take 1 tablet (500 mg total) by mouth 2 (two) times daily. 06/18/23  Yes Adden Strout, Clovis Dar, PA-C  atorvastatin  (LIPITOR) 40 MG tablet Take 1 tablet (40 mg total) by mouth daily. 08/17/22   Rodney Clamp, MD  clonazePAM  (KLONOPIN ) 1 MG tablet TAKE 1 TABLET BY MOUTH 2 TIMES DAILY AS NEEDED FOR ANXIETY. 04/30/23   Rodney Clamp, MD  DULoxetine  (CYMBALTA ) 60 MG capsule Take 1 capsule (60 mg total) by mouth daily. 08/17/22   Rodney Clamp, MD  esomeprazole  (NEXIUM ) 40 MG capsule Take 1 capsule (40 mg total) by mouth daily at 12 noon. 08/17/22   Rodney Clamp, MD  gabapentin  (NEURONTIN ) 300 MG capsule Take 1 capsule (300 mg total) by mouth 3 (three) times daily. 08/17/22   Rodney Clamp, MD  HYDROcodone -acetaminophen  (NORCO/VICODIN) 5-325 MG tablet Take 1-2 tablets by mouth every 4 (four) hours as needed. 06/18/23   Mandy Second, PA-C  lidocaine  4 % Place 1 patch onto the skin daily. 08/17/22   Rodney Clamp, MD  polyethylene glycol (MIRALAX  / GLYCOLAX ) 17 g packet Take 17 g by mouth daily as needed  for mild constipation or moderate constipation. 05/01/22   Pokhrel, Laxman, MD  trazodone  (DESYREL ) 300 MG tablet Take 1 tablet (300 mg total) by mouth at bedtime. 08/17/22   Rodney Clamp, MD      Allergies    Amoxicillin    Review of Systems   Review of Systems  Musculoskeletal:  Positive for back pain.    Physical Exam Updated Vital Signs BP 119/89   Pulse 69   Temp 98.4 F (36.9 C)   Resp 19   Ht 5\' 7"  (1.702 m)   SpO2 95%   BMI 27.63 kg/m  Physical Exam Vitals and nursing note reviewed.  Constitutional:      General: She is not in acute distress.    Appearance: She is well-developed. She is not ill-appearing.  Cardiovascular:     Rate and Rhythm: Normal rate.  Pulmonary:     Effort: Pulmonary effort is normal.  Abdominal:     General: There is no distension.     Palpations: Abdomen is soft.     Tenderness: There is no abdominal tenderness.  Musculoskeletal:        General: Normal range of motion.     Cervical back: Normal range of motion.     Comments: Well healed midline surgical scar from low thoracic to mid-lumbar. No redness, swelling, fluctuance or induration.  Skin:  General: Skin is warm and dry.  Neurological:     Mental Status: She is alert and oriented to person, place, and time.     ED Results / Procedures / Treatments   Labs (all labs ordered are listed, but only abnormal results are displayed) Labs Reviewed  CBC WITH DIFFERENTIAL/PLATELET - Abnormal; Notable for the following components:      Result Value   RBC 3.71 (*)    MCV 100.5 (*)    All other components within normal limits  COMPREHENSIVE METABOLIC PANEL WITH GFR - Abnormal; Notable for the following components:   Total Protein 6.2 (*)    All other components within normal limits  URINALYSIS, ROUTINE W REFLEX MICROSCOPIC   Results for orders placed or performed during the hospital encounter of 06/18/23  Urinalysis, Routine w reflex microscopic -Urine, Clean Catch   Collection  Time: 06/18/23  4:42 PM  Result Value Ref Range   Color, Urine YELLOW YELLOW   APPearance CLEAR CLEAR   Specific Gravity, Urine 1.014 1.005 - 1.030   pH 5.5 5.0 - 8.0   Glucose, UA NEGATIVE NEGATIVE mg/dL   Hgb urine dipstick NEGATIVE NEGATIVE   Bilirubin Urine NEGATIVE NEGATIVE   Ketones, ur NEGATIVE NEGATIVE mg/dL   Protein, ur NEGATIVE NEGATIVE mg/dL   Nitrite NEGATIVE NEGATIVE   Leukocytes,Ua NEGATIVE NEGATIVE  CBC with Differential   Collection Time: 06/18/23  4:50 PM  Result Value Ref Range   WBC 9.2 4.0 - 10.5 K/uL   RBC 3.71 (L) 3.87 - 5.11 MIL/uL   Hemoglobin 12.1 12.0 - 15.0 g/dL   HCT 30.8 65.7 - 84.6 %   MCV 100.5 (H) 80.0 - 100.0 fL   MCH 32.6 26.0 - 34.0 pg   MCHC 32.4 30.0 - 36.0 g/dL   RDW 96.2 95.2 - 84.1 %   Platelets 229 150 - 400 K/uL   nRBC 0.0 0.0 - 0.2 %   Neutrophils Relative % 68 %   Neutro Abs 6.3 1.7 - 7.7 K/uL   Lymphocytes Relative 22 %   Lymphs Abs 2.0 0.7 - 4.0 K/uL   Monocytes Relative 8 %   Monocytes Absolute 0.8 0.1 - 1.0 K/uL   Eosinophils Relative 1 %   Eosinophils Absolute 0.1 0.0 - 0.5 K/uL   Basophils Relative 1 %   Basophils Absolute 0.1 0.0 - 0.1 K/uL   Immature Granulocytes 0 %   Abs Immature Granulocytes 0.02 0.00 - 0.07 K/uL  Comprehensive metabolic panel   Collection Time: 06/18/23  4:50 PM  Result Value Ref Range   Sodium 140 135 - 145 mmol/L   Potassium 4.2 3.5 - 5.1 mmol/L   Chloride 104 98 - 111 mmol/L   CO2 27 22 - 32 mmol/L   Glucose, Bld 93 70 - 99 mg/dL   BUN 11 8 - 23 mg/dL   Creatinine, Ser 3.24 0.44 - 1.00 mg/dL   Calcium  9.5 8.9 - 10.3 mg/dL   Total Protein 6.2 (L) 6.5 - 8.1 g/dL   Albumin  3.7 3.5 - 5.0 g/dL   AST 18 15 - 41 U/L   ALT 6 0 - 44 U/L   Alkaline Phosphatase 89 38 - 126 U/L   Total Bilirubin 0.3 0.0 - 1.2 mg/dL   GFR, Estimated >40 >10 mL/min   Anion gap 9 5 - 15    EKG None  Radiology No results found.  Procedures Procedures    Medications Ordered in ED Medications   HYDROcodone -acetaminophen  (NORCO/VICODIN) 5-325 MG per tablet  1 tablet (has no administration in time range)  methocarbamol (ROBAXIN) tablet 500 mg (has no administration in time range)  HYDROcodone -acetaminophen  (NORCO/VICODIN) 5-325 MG per tablet 1 tablet (1 tablet Oral Given 06/18/23 1706)    ED Course/ Medical Decision Making/ A&P                                 Medical Decision Making This patient presents to the ED for concern of back pain, this involves an extensive number of treatment options, and is a complaint that carries with it a high risk of complications and morbidity.  The differential diagnosis includes injury/fracture, epidural abscess, musculoskeletal pain   Co morbidities that complicate the patient evaluation  History of fusion lower thoracic to L3   Additional history obtained:  Additional history and/or information obtained from chart review, notable for n/a   Lab Tests:  I Ordered, and personally interpreted labs.  The pertinent results include:  No leukocytosis, normal hgb; no electrolyte abnormalities; UA negative for infection, hematuria.     Imaging Studies ordered:  I ordered imaging studies including n/a I independently visualized and interpreted imaging which showed n/a I agree with the radiologist interpretation   Cardiac Monitoring:  The patient was maintained on a cardiac monitor.  I personally viewed and interpreted the cardiac monitored which showed an underlying rhythm of: n/a   Medicines ordered and prescription drug management:  I ordered medication including norco  for pain Reevaluation of the patient after these medicines showed that the patient stayed the same I have reviewed the patients home medicines and have made adjustments as needed   Test Considered:  Mri, imaging - no fall, fever, leukocytosis, swelling, redness to suggest infection. No recent fall or injury to suggest fracture or hardware failure   Critical  Interventions:  [name]   Consultations Obtained:  I requested consultation with the n/a,  and discussed lab and imaging findings as well as pertinent plan - they recommend: n/a   Problem List / ED Course:  Here with back pain with history of fusion, L1 compression. Using Lidocaine  patch without relief No fall, fever,  Labs unremarkable here, no fever, VSS Pain difficult to control - recommend continuation of lidocaine  patch, addition of Robaxin and Norco as prescribed.  Close recheck by PCP   Reevaluation:  After the interventions noted above, I reevaluated the patient and found that they have :stayed the same   Social Determinants of Health:  Lives in assisted living (new to facility)   Disposition:  After consideration of the diagnostic results and the patients response to treatment, I feel that the patient would benefit from discharge with pain management. .   Amount and/or Complexity of Data Reviewed Labs: ordered.  Risk Prescription drug management.           Final Clinical Impression(s) / ED Diagnoses Final diagnoses:  Midline low back pain without sciatica, unspecified chronicity    Rx / DC Orders ED Discharge Orders          Ordered    HYDROcodone -acetaminophen  (NORCO/VICODIN) 5-325 MG tablet  Every 4 hours PRN,   Status:  Discontinued        06/18/23 1824    methocarbamol (ROBAXIN) 500 MG tablet  2 times daily        06/18/23 1824    HYDROcodone -acetaminophen  (NORCO/VICODIN) 5-325 MG tablet  Every 4 hours PRN        06/18/23  4098              Mandy Second, PA-C 06/18/23 1834    Tegeler, Marine Sia, MD 06/18/23 [phone]

## 2023-06-18 NOTE — ED Notes (Signed)
 RN reviewed discharge instructions with pt. Pt verbalized understanding and had no further questions. VSS upon discharge.

## 2023-06-18 NOTE — Discharge Instructions (Signed)
 As we discussed, your lab studies are normal and no infection or new injury is felt to be the cause of your pain. We are prescribing Norco (1-2 tablets every 4-6 hours) and Robaxin which is a muscle relaxer. Continue using the lidocaine  patch as provided by your doctor.   Have Dr. Daneil Dunker recheck your pain in 24-48 hours to see if these work to control your pain. If imaging is felt needed for ongoing or severe pain, we recommend being seen at [facility] or [facility] ED as MRI is not available in the free-standing ED's like [facility] or [facility].

## 2023-06-18 NOTE — Progress Notes (Signed)
 Marland Kitchen  ortho

## 2023-06-18 NOTE — ED Notes (Signed)
PTAR at bedside to transport pt back to facility 

## 2023-06-18 NOTE — ED Triage Notes (Signed)
 Pt BIB [facility] from [facility] reporting lower back pain x2 weeks. Denies fall/injury, taking tylenol  with no improvement.

## 2023-06-18 NOTE — Telephone Encounter (Signed)
 Referral placed.

## 2023-06-19 ENCOUNTER — Telehealth: Payer: Self-pay

## 2023-06-19 NOTE — Transitions of Care (Post Inpatient/ED Visit) (Signed)
   06/19/2023  Name: Karla Foster MRN: [id number] DOB: [birth year]  Today's TOC FU Call Status: Today's TOC FU Call Status:: Successful TOC FU Call Completed TOC FU Call Complete Date: 06/19/23 Patient's Name and Date of Birth confirmed.  Transition Care Management Follow-up Telephone Call Date of Discharge: 06/18/23 Discharge Facility: [facility] ([facility]) Type of Discharge: Emergency Department Reason for ED Visit: Other: (low back pain) How have you been since you were released from the hospital?: Same Any questions or concerns?: No  Items Reviewed: Did you receive and understand the discharge instructions provided?: Yes Medications obtained,verified, and reconciled?: Yes (Medications Reviewed) Any new allergies since your discharge?: No Dietary orders reviewed?: Yes Do you have support at home?: Yes People in Home [RPT]: spouse  Medications Reviewed Today: Medications Reviewed Today     Reviewed by Darrall Ellison, LPN (Licensed Practical Nurse) on 06/19/23 at 1450  Med List Status: <None>   Medication Order Taking? Sig Documenting Provider Last Dose Status Informant  atorvastatin  (LIPITOR) 40 MG tablet [id number] No Take 1 tablet (40 mg total) by mouth daily. Rodney Clamp, MD Taking Active   clonazePAM  (KLONOPIN ) 1 MG tablet [id number]  TAKE 1 TABLET BY MOUTH 2 TIMES DAILY AS NEEDED FOR ANXIETY. Rodney Clamp, MD  Active   DULoxetine  (CYMBALTA ) 60 MG capsule [id number] No Take 1 capsule (60 mg total) by mouth daily. Rodney Clamp, MD Taking Active   esomeprazole  (NEXIUM ) 40 MG capsule [id number] No Take 1 capsule (40 mg total) by mouth daily at 12 noon. Rodney Clamp, MD Taking Active   gabapentin  (NEURONTIN ) 300 MG capsule [id number] No Take 1 capsule (300 mg total) by mouth 3 (three) times daily. Rodney Clamp, MD Taking Active   HYDROcodone -acetaminophen  (NORCO/VICODIN) 5-325 MG tablet [id number]  Take 1-2 tablets by mouth every 4 (four) hours as  needed. Mandy Second, PA-C  Active   lidocaine  4 % [id number] No Place 1 patch onto the skin daily. Rodney Clamp, MD Taking Active   methocarbamol (ROBAXIN) 500 MG tablet [id number]  Take 1 tablet (500 mg total) by mouth 2 (two) times daily. Mandy Second, PA-C  Active   polyethylene glycol (MIRALAX  / GLYCOLAX ) 17 g packet [id number] No Take 17 g by mouth daily as needed for mild constipation or moderate constipation. Pokhrel, Laxman, MD Taking Active   trazodone  (DESYREL ) 300 MG tablet [id number] No Take 1 tablet (300 mg total) by mouth at bedtime. Rodney Clamp, MD Taking Active             Home Care and Equipment/Supplies: Were Home Health Services Ordered?: NA Any new equipment or medical supplies ordered?: NA  Functional Questionnaire: Do you need assistance with bathing/showering or dressing?: No Do you need assistance with meal preparation?: No Do you need assistance with eating?: No Do you have difficulty maintaining continence: No Do you need assistance with getting out of bed/getting out of a chair/moving?: No Do you have difficulty managing or taking your medications?: No  Follow up appointments reviewed: PCP Follow-up appointment confirmed?: Yes Date of PCP follow-up appointment?: 06/24/23 Follow-up Provider: [facility] Follow-up appointment confirmed?: NA Do you need transportation to your follow-up appointment?: No Do you understand care options if your condition(s) worsen?: Yes-patient verbalized understanding    SIGNATURE Darrall Ellison, LPN [facility] Nurse Health Advisor Direct Dial [phone]

## 2023-06-24 ENCOUNTER — Telehealth: Payer: Self-pay | Admitting: *Deleted

## 2023-06-24 ENCOUNTER — Ambulatory Visit: Admitting: Family Medicine

## 2023-06-24 ENCOUNTER — Other Ambulatory Visit: Payer: Self-pay

## 2023-06-24 ENCOUNTER — Inpatient Hospital Stay (HOSPITAL_COMMUNITY)
Admission: EM | Admit: 2023-06-24 | Discharge: 2023-07-21 | DRG: 212 | Disposition: E | Attending: Surgery | Admitting: Surgery

## 2023-06-24 ENCOUNTER — Inpatient Hospital Stay (HOSPITAL_COMMUNITY)

## 2023-06-24 ENCOUNTER — Encounter (HOSPITAL_COMMUNITY): Payer: Self-pay

## 2023-06-24 ENCOUNTER — Emergency Department (HOSPITAL_COMMUNITY)

## 2023-06-24 ENCOUNTER — Encounter: Payer: Self-pay | Admitting: Family Medicine

## 2023-06-24 ENCOUNTER — Telehealth (HOSPITAL_COMMUNITY): Payer: Self-pay | Admitting: Pharmacy Technician

## 2023-06-24 ENCOUNTER — Other Ambulatory Visit (HOSPITAL_COMMUNITY): Payer: Self-pay

## 2023-06-24 VITALS — BP 83/54 | HR 76 | Temp 97.2°F | Ht 67.0 in | Wt 166.0 lb

## 2023-06-24 DIAGNOSIS — M545 Low back pain, unspecified: Secondary | ICD-10-CM | POA: Diagnosis not present

## 2023-06-24 DIAGNOSIS — E8721 Acute metabolic acidosis: Secondary | ICD-10-CM | POA: Diagnosis not present

## 2023-06-24 DIAGNOSIS — I5023 Acute on chronic systolic (congestive) heart failure: Secondary | ICD-10-CM | POA: Diagnosis not present

## 2023-06-24 DIAGNOSIS — Z79899 Other long term (current) drug therapy: Secondary | ICD-10-CM

## 2023-06-24 DIAGNOSIS — F05 Delirium due to known physiological condition: Secondary | ICD-10-CM | POA: Diagnosis not present

## 2023-06-24 DIAGNOSIS — Z515 Encounter for palliative care: Secondary | ICD-10-CM

## 2023-06-24 DIAGNOSIS — S32010D Wedge compression fracture of first lumbar vertebra, subsequent encounter for fracture with routine healing: Secondary | ICD-10-CM | POA: Diagnosis not present

## 2023-06-24 DIAGNOSIS — N170 Acute kidney failure with tubular necrosis: Secondary | ICD-10-CM | POA: Diagnosis not present

## 2023-06-24 DIAGNOSIS — F32A Depression, unspecified: Secondary | ICD-10-CM | POA: Diagnosis present

## 2023-06-24 DIAGNOSIS — I517 Cardiomegaly: Secondary | ICD-10-CM | POA: Diagnosis not present

## 2023-06-24 DIAGNOSIS — Z01810 Encounter for preprocedural cardiovascular examination: Secondary | ICD-10-CM | POA: Diagnosis not present

## 2023-06-24 DIAGNOSIS — Z8249 Family history of ischemic heart disease and other diseases of the circulatory system: Secondary | ICD-10-CM

## 2023-06-24 DIAGNOSIS — M4800 Spinal stenosis, site unspecified: Secondary | ICD-10-CM | POA: Diagnosis present

## 2023-06-24 DIAGNOSIS — K529 Noninfective gastroenteritis and colitis, unspecified: Secondary | ICD-10-CM

## 2023-06-24 DIAGNOSIS — J811 Chronic pulmonary edema: Secondary | ICD-10-CM | POA: Diagnosis not present

## 2023-06-24 DIAGNOSIS — Z953 Presence of xenogenic heart valve: Secondary | ICD-10-CM | POA: Diagnosis not present

## 2023-06-24 DIAGNOSIS — I5082 Biventricular heart failure: Secondary | ICD-10-CM | POA: Diagnosis present

## 2023-06-24 DIAGNOSIS — Z66 Do not resuscitate: Secondary | ICD-10-CM | POA: Diagnosis not present

## 2023-06-24 DIAGNOSIS — I340 Nonrheumatic mitral (valve) insufficiency: Secondary | ICD-10-CM

## 2023-06-24 DIAGNOSIS — E039 Hypothyroidism, unspecified: Secondary | ICD-10-CM | POA: Diagnosis not present

## 2023-06-24 DIAGNOSIS — I472 Ventricular tachycardia, unspecified: Secondary | ICD-10-CM | POA: Diagnosis not present

## 2023-06-24 DIAGNOSIS — E871 Hypo-osmolality and hyponatremia: Secondary | ICD-10-CM | POA: Diagnosis not present

## 2023-06-24 DIAGNOSIS — I4891 Unspecified atrial fibrillation: Secondary | ICD-10-CM

## 2023-06-24 DIAGNOSIS — R570 Cardiogenic shock: Secondary | ICD-10-CM | POA: Diagnosis not present

## 2023-06-24 DIAGNOSIS — R079 Chest pain, unspecified: Secondary | ICD-10-CM | POA: Diagnosis not present

## 2023-06-24 DIAGNOSIS — R6889 Other general symptoms and signs: Secondary | ICD-10-CM | POA: Diagnosis not present

## 2023-06-24 DIAGNOSIS — Z880 Allergy status to penicillin: Secondary | ICD-10-CM

## 2023-06-24 DIAGNOSIS — E785 Hyperlipidemia, unspecified: Secondary | ICD-10-CM | POA: Diagnosis present

## 2023-06-24 DIAGNOSIS — R000 Tachycardia, unspecified: Secondary | ICD-10-CM | POA: Diagnosis not present

## 2023-06-24 DIAGNOSIS — I080 Rheumatic disorders of both mitral and aortic valves: Secondary | ICD-10-CM | POA: Diagnosis not present

## 2023-06-24 DIAGNOSIS — I272 Pulmonary hypertension, unspecified: Secondary | ICD-10-CM | POA: Diagnosis present

## 2023-06-24 DIAGNOSIS — I059 Rheumatic mitral valve disease, unspecified: Secondary | ICD-10-CM | POA: Diagnosis not present

## 2023-06-24 DIAGNOSIS — R531 Weakness: Secondary | ICD-10-CM | POA: Diagnosis not present

## 2023-06-24 DIAGNOSIS — Z83719 Family history of colon polyps, unspecified: Secondary | ICD-10-CM

## 2023-06-24 DIAGNOSIS — E860 Dehydration: Secondary | ICD-10-CM | POA: Diagnosis not present

## 2023-06-24 DIAGNOSIS — K219 Gastro-esophageal reflux disease without esophagitis: Secondary | ICD-10-CM | POA: Diagnosis present

## 2023-06-24 DIAGNOSIS — G4700 Insomnia, unspecified: Secondary | ICD-10-CM | POA: Diagnosis present

## 2023-06-24 DIAGNOSIS — I2510 Atherosclerotic heart disease of native coronary artery without angina pectoris: Secondary | ICD-10-CM | POA: Diagnosis present

## 2023-06-24 DIAGNOSIS — D598 Other acquired hemolytic anemias: Secondary | ICD-10-CM | POA: Diagnosis not present

## 2023-06-24 DIAGNOSIS — I110 Hypertensive heart disease with heart failure: Secondary | ICD-10-CM | POA: Diagnosis not present

## 2023-06-24 DIAGNOSIS — Z48812 Encounter for surgical aftercare following surgery on the circulatory system: Secondary | ICD-10-CM | POA: Diagnosis not present

## 2023-06-24 DIAGNOSIS — Z4682 Encounter for fitting and adjustment of non-vascular catheter: Secondary | ICD-10-CM | POA: Diagnosis not present

## 2023-06-24 DIAGNOSIS — I499 Cardiac arrhythmia, unspecified: Secondary | ICD-10-CM | POA: Diagnosis not present

## 2023-06-24 DIAGNOSIS — D62 Acute posthemorrhagic anemia: Secondary | ICD-10-CM | POA: Diagnosis not present

## 2023-06-24 DIAGNOSIS — D65 Disseminated intravascular coagulation [defibrination syndrome]: Secondary | ICD-10-CM | POA: Diagnosis not present

## 2023-06-24 DIAGNOSIS — I069 Rheumatic aortic valve disease, unspecified: Secondary | ICD-10-CM | POA: Diagnosis not present

## 2023-06-24 DIAGNOSIS — R918 Other nonspecific abnormal finding of lung field: Secondary | ICD-10-CM | POA: Diagnosis not present

## 2023-06-24 DIAGNOSIS — I3139 Other pericardial effusion (noninflammatory): Secondary | ICD-10-CM | POA: Diagnosis not present

## 2023-06-24 DIAGNOSIS — R197 Diarrhea, unspecified: Secondary | ICD-10-CM | POA: Diagnosis not present

## 2023-06-24 DIAGNOSIS — J9601 Acute respiratory failure with hypoxia: Secondary | ICD-10-CM | POA: Diagnosis not present

## 2023-06-24 DIAGNOSIS — Z5982 Transportation insecurity: Secondary | ICD-10-CM

## 2023-06-24 DIAGNOSIS — M1990 Unspecified osteoarthritis, unspecified site: Secondary | ICD-10-CM | POA: Diagnosis present

## 2023-06-24 DIAGNOSIS — I428 Other cardiomyopathies: Secondary | ICD-10-CM | POA: Diagnosis present

## 2023-06-24 DIAGNOSIS — A419 Sepsis, unspecified organism: Secondary | ICD-10-CM | POA: Diagnosis not present

## 2023-06-24 DIAGNOSIS — N179 Acute kidney failure, unspecified: Secondary | ICD-10-CM | POA: Diagnosis not present

## 2023-06-24 DIAGNOSIS — Z8611 Personal history of tuberculosis: Secondary | ICD-10-CM

## 2023-06-24 DIAGNOSIS — J189 Pneumonia, unspecified organism: Secondary | ICD-10-CM | POA: Diagnosis not present

## 2023-06-24 DIAGNOSIS — R0989 Other specified symptoms and signs involving the circulatory and respiratory systems: Secondary | ICD-10-CM | POA: Diagnosis not present

## 2023-06-24 DIAGNOSIS — I5030 Unspecified diastolic (congestive) heart failure: Secondary | ICD-10-CM | POA: Diagnosis not present

## 2023-06-24 DIAGNOSIS — I10 Essential (primary) hypertension: Secondary | ICD-10-CM | POA: Diagnosis not present

## 2023-06-24 DIAGNOSIS — G43909 Migraine, unspecified, not intractable, without status migrainosus: Secondary | ICD-10-CM | POA: Diagnosis present

## 2023-06-24 DIAGNOSIS — R6521 Severe sepsis with septic shock: Secondary | ICD-10-CM | POA: Diagnosis not present

## 2023-06-24 DIAGNOSIS — Z981 Arthrodesis status: Secondary | ICD-10-CM

## 2023-06-24 DIAGNOSIS — I429 Cardiomyopathy, unspecified: Secondary | ICD-10-CM | POA: Diagnosis not present

## 2023-06-24 DIAGNOSIS — Z87891 Personal history of nicotine dependence: Secondary | ICD-10-CM | POA: Diagnosis not present

## 2023-06-24 DIAGNOSIS — I5021 Acute systolic (congestive) heart failure: Secondary | ICD-10-CM | POA: Diagnosis not present

## 2023-06-24 DIAGNOSIS — E162 Hypoglycemia, unspecified: Secondary | ICD-10-CM | POA: Diagnosis not present

## 2023-06-24 DIAGNOSIS — J449 Chronic obstructive pulmonary disease, unspecified: Secondary | ICD-10-CM | POA: Diagnosis not present

## 2023-06-24 DIAGNOSIS — Z9049 Acquired absence of other specified parts of digestive tract: Secondary | ICD-10-CM

## 2023-06-24 DIAGNOSIS — I351 Nonrheumatic aortic (valve) insufficiency: Secondary | ICD-10-CM | POA: Diagnosis not present

## 2023-06-24 DIAGNOSIS — Z452 Encounter for adjustment and management of vascular access device: Secondary | ICD-10-CM | POA: Diagnosis not present

## 2023-06-24 DIAGNOSIS — I509 Heart failure, unspecified: Secondary | ICD-10-CM | POA: Diagnosis not present

## 2023-06-24 DIAGNOSIS — G8929 Other chronic pain: Secondary | ICD-10-CM | POA: Diagnosis present

## 2023-06-24 DIAGNOSIS — J810 Acute pulmonary edema: Secondary | ICD-10-CM | POA: Diagnosis not present

## 2023-06-24 DIAGNOSIS — J90 Pleural effusion, not elsewhere classified: Secondary | ICD-10-CM | POA: Diagnosis not present

## 2023-06-24 DIAGNOSIS — I361 Nonrheumatic tricuspid (valve) insufficiency: Secondary | ICD-10-CM | POA: Diagnosis not present

## 2023-06-24 LAB — COMPREHENSIVE METABOLIC PANEL WITH GFR
ALT: 12 U/L (ref 0–44)
AST: 21 U/L (ref 15–41)
Albumin: 3.4 g/dL — ABNORMAL LOW (ref 3.5–5.0)
Alkaline Phosphatase: 65 U/L (ref 38–126)
Anion gap: 12 (ref 5–15)
BUN: 10 mg/dL (ref 8–23)
CO2: 22 mmol/L (ref 22–32)
Calcium: 9.1 mg/dL (ref 8.9–10.3)
Chloride: 104 mmol/L (ref 98–111)
Creatinine, Ser: 0.92 mg/dL (ref 0.44–1.00)
GFR, Estimated: >60 mL/min (ref >60)
Glucose, Bld: 100 mg/dL — ABNORMAL HIGH (ref 70–99)
Potassium: 3.9 mmol/L (ref 3.5–5.1)
Sodium: 138 mmol/L (ref 135–145)
Total Bilirubin: 0.5 mg/dL (ref 0.0–1.2)
Total Protein: 6.3 g/dL — ABNORMAL LOW (ref 6.5–8.1)

## 2023-06-24 LAB — ECHOCARDIOGRAM COMPLETE
Height: 67 in
MV M vel: 5 m/s
MV Peak grad: 99.8 mmHg
MV VTI: 1.05 cm2
Radius: 0.9 cm
S' Lateral: 3.5 cm
Weight: 2656 [oz_av]

## 2023-06-24 LAB — CBC
HCT: 38.5 % (ref 36.0–46.0)
Hemoglobin: 12.4 g/dL (ref 12.0–15.0)
MCH: 32.4 pg (ref 26.0–34.0)
MCHC: 32.2 g/dL (ref 30.0–36.0)
MCV: 100.5 fL — ABNORMAL HIGH (ref 80.0–100.0)
Platelets: 254 10*3/uL (ref 150–400)
RBC: 3.83 MIL/uL — ABNORMAL LOW (ref 3.87–5.11)
RDW: 13.8 % (ref 11.5–15.5)
WBC: 10.8 10*3/uL — ABNORMAL HIGH (ref 4.0–10.5)
nRBC: 0 % (ref 0.0–0.2)

## 2023-06-24 LAB — TSH: TSH: 2.807 u[IU]/mL (ref 0.350–4.500)

## 2023-06-24 LAB — TROPONIN I (HIGH SENSITIVITY)
Troponin I (High Sensitivity): 16 ng/L (ref <18)
Troponin I (High Sensitivity): 23 ng/L — ABNORMAL HIGH (ref <18)

## 2023-06-24 LAB — MAGNESIUM: Magnesium: 1.7 mg/dL (ref 1.7–2.4)

## 2023-06-24 LAB — LACTIC ACID, PLASMA: Lactic Acid, Venous: 1.3 mmol/L (ref 0.5–1.9)

## 2023-06-24 LAB — BRAIN NATRIURETIC PEPTIDE: B Natriuretic Peptide: 771.9 pg/mL — ABNORMAL HIGH (ref 0.0–100.0)

## 2023-06-24 LAB — HEPARIN LEVEL (UNFRACTIONATED): Heparin Unfractionated: 0.29 [IU]/mL — ABNORMAL LOW (ref 0.30–0.70)

## 2023-06-24 LAB — MRSA NEXT GEN BY PCR, NASAL: MRSA by PCR Next Gen: NOT DETECTED

## 2023-06-24 LAB — I-STAT CG4 LACTIC ACID, ED: Lactic Acid, Venous: 1.7 mmol/L (ref 0.5–1.9)

## 2023-06-24 MED ORDER — GABAPENTIN 300 MG PO CAPS
300.0000 mg | ORAL_CAPSULE | Freq: Three times a day (TID) | ORAL | Status: DC
  Administered 2023-06-24 – 2023-06-27 (×11): 300 mg via ORAL
  Filled 2023-06-24: qty 3
  Filled 2023-06-24 (×10): qty 1

## 2023-06-24 MED ORDER — HEPARIN BOLUS VIA INFUSION
3500.0000 [IU] | Freq: Once | INTRAVENOUS | Status: AC
  Administered 2023-06-24: 3500 [IU] via INTRAVENOUS
  Filled 2023-06-24: qty 3500

## 2023-06-24 MED ORDER — AMIODARONE HCL IN DEXTROSE 360-4.14 MG/200ML-% IV SOLN
60.0000 mg/h | INTRAVENOUS | Status: DC
  Administered 2023-06-24: 30 mg/h via INTRAVENOUS
  Administered 2023-06-25 (×2): 60 mg/h via INTRAVENOUS
  Administered 2023-06-25: 30 mg/h via INTRAVENOUS
  Administered 2023-06-26 – 2023-06-27 (×5): 60 mg/h via INTRAVENOUS
  Filled 2023-06-24 (×9): qty 200

## 2023-06-24 MED ORDER — MAGNESIUM SULFATE 2 GM/50ML IV SOLN
2.0000 g | Freq: Once | INTRAVENOUS | Status: DC
  Filled 2023-06-24: qty 50

## 2023-06-24 MED ORDER — LORAZEPAM 2 MG/ML IJ SOLN
0.5000 mg | Freq: Once | INTRAMUSCULAR | Status: AC
  Administered 2023-06-24: 0.5 mg via INTRAVENOUS
  Filled 2023-06-24: qty 1

## 2023-06-24 MED ORDER — AMIODARONE LOAD VIA INFUSION
150.0000 mg | Freq: Once | INTRAVENOUS | Status: AC
  Administered 2023-06-24: 150 mg via INTRAVENOUS
  Filled 2023-06-24: qty 83.34

## 2023-06-24 MED ORDER — ACETAMINOPHEN 325 MG PO TABS: 650.0000 mg | ORAL_TABLET | ORAL | Status: DC | PRN

## 2023-06-24 MED ORDER — FUROSEMIDE 10 MG/ML IJ SOLN
40.0000 mg | Freq: Three times a day (TID) | INTRAMUSCULAR | Status: DC
  Administered 2023-06-24 – 2023-06-26 (×6): 40 mg via INTRAVENOUS
  Filled 2023-06-24 (×6): qty 4

## 2023-06-24 MED ORDER — ONDANSETRON HCL 4 MG PO TABS: 4.0000 mg | ORAL_TABLET | Freq: Three times a day (TID) | ORAL | 0 refills | Status: DC | PRN

## 2023-06-24 MED ORDER — NITROGLYCERIN 0.4 MG SL SUBL: 0.4000 mg | SUBLINGUAL_TABLET | SUBLINGUAL | Status: DC | PRN

## 2023-06-24 MED ORDER — ASPIRIN 81 MG PO CHEW
81.0000 mg | CHEWABLE_TABLET | ORAL | Status: AC
  Administered 2023-06-25: 81 mg via ORAL
  Filled 2023-06-24: qty 1

## 2023-06-24 MED ORDER — TRAMADOL HCL 50 MG PO TABS: 50.0000 mg | ORAL_TABLET | Freq: Four times a day (QID) | ORAL | Status: DC | PRN

## 2023-06-24 MED ORDER — HEPARIN (PORCINE) 25000 UT/250ML-% IV SOLN
1250.0000 [IU]/h | INTRAVENOUS | Status: DC
  Administered 2023-06-24: 1050 [IU]/h via INTRAVENOUS

## 2023-06-24 MED ORDER — AMIODARONE HCL IN DEXTROSE 360-4.14 MG/200ML-% IV SOLN
60.0000 mg/h | INTRAVENOUS | Status: AC
  Administered 2023-06-24: 60 mg/h via INTRAVENOUS
  Filled 2023-06-24 (×2): qty 200

## 2023-06-24 MED ORDER — SODIUM CHLORIDE 0.9% FLUSH: 10.0000 mL | INTRAVENOUS | Status: DC | PRN

## 2023-06-24 MED ORDER — POTASSIUM CHLORIDE CRYS ER 20 MEQ PO TBCR
40.0000 meq | EXTENDED_RELEASE_TABLET | Freq: Once | ORAL | Status: AC
  Administered 2023-06-24: 40 meq via ORAL
  Filled 2023-06-24: qty 2

## 2023-06-24 MED ORDER — PANTOPRAZOLE SODIUM 40 MG PO TBEC
40.0000 mg | DELAYED_RELEASE_TABLET | Freq: Every day | ORAL | Status: DC
  Administered 2023-06-24 – 2023-06-27 (×4): 40 mg via ORAL
  Filled 2023-06-24 (×4): qty 1

## 2023-06-24 MED ORDER — SODIUM CHLORIDE 0.9% FLUSH
10.0000 mL | Freq: Two times a day (BID) | INTRAVENOUS | Status: DC
  Administered 2023-06-24 – 2023-06-27 (×5): 10 mL

## 2023-06-24 MED ORDER — HEPARIN (PORCINE) 25000 UT/250ML-% IV SOLN
1050.0000 [IU]/h | INTRAVENOUS | Status: DC
  Administered 2023-06-24: 1050 [IU]/h via INTRAVENOUS
  Filled 2023-06-24: qty 250

## 2023-06-24 MED ORDER — DIGOXIN 125 MCG PO TABS
0.1250 mg | ORAL_TABLET | Freq: Every day | ORAL | Status: DC
  Administered 2023-06-25 – 2023-06-26 (×2): 0.125 mg via ORAL
  Filled 2023-06-24 (×2): qty 1

## 2023-06-24 MED ORDER — GABAPENTIN 300 MG PO CAPS: 300.0000 mg | ORAL_CAPSULE | Freq: Three times a day (TID) | ORAL | 3 refills | Status: DC

## 2023-06-24 MED ORDER — HYDROCODONE-ACETAMINOPHEN 5-325 MG PO TABS
1.0000 | ORAL_TABLET | ORAL | 0 refills | Status: DC | PRN
Start: 2023-06-24 — End: 2023-07-01

## 2023-06-24 MED ORDER — SODIUM CHLORIDE 0.9 % IV BOLUS
1000.0000 mL | Freq: Once | INTRAVENOUS | Status: AC
  Administered 2023-06-24: 1000 mL via INTRAVENOUS

## 2023-06-24 MED ORDER — HEPARIN BOLUS VIA INFUSION
3050.0000 [IU] | Freq: Once | INTRAVENOUS | Status: DC
  Filled 2023-06-24: qty 3050

## 2023-06-24 MED ORDER — APIXABAN 5 MG PO TABS: 5.0000 mg | ORAL_TABLET | Freq: Two times a day (BID) | ORAL | Status: DC

## 2023-06-24 MED ORDER — ATORVASTATIN CALCIUM 40 MG PO TABS
40.0000 mg | ORAL_TABLET | Freq: Every day | ORAL | Status: DC
  Administered 2023-06-24 – 2023-06-29 (×5): 40 mg via ORAL
  Filled 2023-06-24 (×6): qty 1

## 2023-06-24 MED ORDER — DIGOXIN 0.25 MG/ML IJ SOLN
0.2500 mg | Freq: Once | INTRAMUSCULAR | Status: AC
  Administered 2023-06-24: 0.25 mg via INTRAVENOUS
  Filled 2023-06-24: qty 2

## 2023-06-24 MED ORDER — CHLORHEXIDINE GLUCONATE CLOTH 2 % EX PADS
6.0000 | MEDICATED_PAD | Freq: Every day | CUTANEOUS | Status: DC
  Administered 2023-06-24 – 2023-06-27 (×3): 6 via TOPICAL

## 2023-06-24 MED ORDER — SODIUM CHLORIDE 0.9 % IV SOLN
INTRAVENOUS | Status: DC
  Administered 2023-06-25: 10 mL via INTRAVENOUS

## 2023-06-24 MED ORDER — METOPROLOL TARTRATE 5 MG/5ML IV SOLN
2.5000 mg | INTRAVENOUS | Status: DC | PRN
  Administered 2023-06-24: 2.5 mg via INTRAVENOUS
  Filled 2023-06-24: qty 5

## 2023-06-24 MED ORDER — ONDANSETRON HCL 4 MG/2ML IJ SOLN: 4.0000 mg | Freq: Four times a day (QID) | INTRAMUSCULAR | Status: DC | PRN

## 2023-06-24 MED ORDER — CALCIUM GLUCONATE-NACL 1-0.675 GM/50ML-% IV SOLN
1.0000 g | Freq: Once | INTRAVENOUS | Status: AC
  Administered 2023-06-24: 1000 mg via INTRAVENOUS
  Filled 2023-06-24: qty 50

## 2023-06-24 NOTE — Assessment & Plan Note (Signed)
 She has had worsening pain for the last month or so.  We did discuss further management evaluation today prior to her above finding of A-fib including imaging and pain control.  We did order an x-ray today however this will likely need to be completed during or after her hospitalization.  I refilled her meds including gabapentin .  May consider referral back to orthopedics or PT if not improving with above.

## 2023-06-24 NOTE — Progress Notes (Signed)
 Peripherally Inserted Central Catheter Placement  The IV Nurse has discussed with the patient and/or persons authorized to consent for the patient, the purpose of this procedure and the potential benefits and risks involved with this procedure.  The benefits include less needle sticks, lab draws from the catheter, and the patient may be discharged home with the catheter. Risks include, but not limited to, infection, bleeding, blood clot (thrombus formation), and puncture of an artery; nerve damage and irregular heartbeat and possibility to perform a PICC exchange if needed/ordered by physician.  Alternatives to this procedure were also discussed.  Bard Power PICC patient education guide, fact sheet on infection prevention and patient information card has been provided to patient /or left at bedside.    PICC Placement Documentation  PICC Double Lumen 06/24/23 Left Basilic 43 cm 0 cm (Active)  Indication for Insertion or Continuance of Line Vasoactive infusions 06/24/23 1800  Exposed Catheter (cm) 0 cm 06/24/23 1800  Site Assessment Clean, Dry, Intact 06/24/23 1800  Lumen #1 Status Flushed;Saline locked;Blood return noted 06/24/23 1800  Lumen #2 Status Flushed;Saline locked;Blood return noted 06/24/23 1800  Dressing Type Transparent;Securing device 06/24/23 1800  Dressing Status Antimicrobial disc/dressing in place;Clean, Dry, Intact 06/24/23 1800  Line Care Connections checked and tightened 06/24/23 1800  Line Adjustment (NICU/IV Team Only) No 06/24/23 1800  Dressing Intervention New dressing 06/24/23 1800  Dressing Change Due 07/12/2023 06/24/23 1800       Karla Foster 06/24/2023, 6:28 PM

## 2023-06-24 NOTE — ED Notes (Signed)
 PT's left arm IV was infiltrated. IV was removed and PT's arm was wrapped.

## 2023-06-24 NOTE — Telephone Encounter (Signed)
 Patient Product/process development scientist completed.    The patient is insured through [facility]. Patient has Medicare and is not eligible for a copay card, but may be able to apply for patient assistance or Medicare RX Payment Plan (Patient Must reach out to their plan, if eligible for payment plan), if available.    Ran test claim for Eliquis 5 mg and the current 30 day co-pay is $289.94 due to a deductible.  Ran test claim for Entresto 24-26 mg and the current 30 day co-pay is $289.94 due to a deductible.  Ran test claim for Farxgia 10 mg and the current 30 day co-pay is $289.94 due to a deductible.  Ran test claim for Jardiance 10 mg and the current 30 day co-pay is $289.94 due to a deductible.   This test claim was processed through [facility]- copay amounts may vary at other pharmacies due to pharmacy/plan contracts, or as the patient moves through the different stages of their insurance plan.     Morgan Arab, CPHT Pharmacy Technician III Certified Patient Advocate [facility] Patient Advocate Team Direct Number: [phone]  Fax: [phone]

## 2023-06-24 NOTE — ED Provider Notes (Signed)
 [facility] Provider Note   CSN: [id number] Arrival date & time: 06/24/23  1029     History  Chief Complaint  Patient presents with   Atrial Fibrillation   Weakness    Karla Foster is a 68 y.o. female.  68 year old female with a history of chronic back pain, hypothyroidism, C. difficile, and palpitations who presents to the emergency department with vomiting, diarrhea, and elevated heart rate.  Patient reports that several days ago she was having nausea and vomiting and diarrhea.  Approximately 3 loose stools per day.  Also had some nausea and vomiting.  Says that the symptoms have resolved today but she feels generally ill.  Also has been having some palpitations but does not recall any started.  Went to her primary doctor and was noted to be hypotensive in atrial fibrillation with RVR.  Transferred to the emergency department for evaluation.  Given 400 mL of fluids prior to arrival.       Home Medications Prior to Admission medications   Medication Sig Start Date End Date Taking? Authorizing Provider  atorvastatin  (LIPITOR) 40 MG tablet Take 1 tablet (40 mg total) by mouth daily. 08/17/22  Yes Rodney Clamp, MD  clonazePAM  (KLONOPIN ) 1 MG tablet TAKE 1 TABLET BY MOUTH 2 TIMES DAILY AS NEEDED FOR ANXIETY. 04/30/23  Yes Rodney Clamp, MD  DULoxetine  (CYMBALTA ) 60 MG capsule Take 1 capsule (60 mg total) by mouth daily. 08/17/22  Yes Rodney Clamp, MD  esomeprazole  (NEXIUM ) 40 MG capsule Take 1 capsule (40 mg total) by mouth daily at 12 noon. 08/17/22  Yes Rodney Clamp, MD  gabapentin  (NEURONTIN ) 300 MG capsule Take 1 capsule (300 mg total) by mouth 3 (three) times daily. 06/24/23  Yes Rodney Clamp, MD  HYDROcodone -acetaminophen  (NORCO/VICODIN) 5-325 MG tablet Take 1-2 tablets by mouth every 4 (four) hours as needed. Patient taking differently: Take 1-2 tablets by mouth every 4 (four) hours as needed for moderate pain (pain  score 4-6) or severe pain (pain score 7-10). 06/24/23  Yes Rodney Clamp, MD  trazodone  (DESYREL ) 300 MG tablet Take 1 tablet (300 mg total) by mouth at bedtime. 08/17/22  Yes Rodney Clamp, MD  ondansetron  (ZOFRAN ) 4 MG tablet Take 1 tablet (4 mg total) by mouth every 8 (eight) hours as needed for nausea or vomiting. Patient not taking: Reported on 06/24/2023 06/24/23   Rodney Clamp, MD      Allergies    Amoxicillin    Review of Systems   Review of Systems  Physical Exam Updated Vital Signs BP (!) 129/94 (BP Location: Right Arm)   Pulse 61   Temp 97.9 F (36.6 C) (Oral)   Resp (!) 24   Ht 5\' 7"  (1.702 m)   Wt 75.3 kg   SpO2 100%   BMI 26.00 kg/m  Physical Exam Vitals and nursing note reviewed.  Constitutional:      General: She is not in acute distress.    Appearance: She is well-developed.  HENT:     Head: Normocephalic and atraumatic.     Right Ear: External ear normal.     Left Ear: External ear normal.     Nose: Nose normal.  Eyes:     Extraocular Movements: Extraocular movements intact.     Conjunctiva/sclera: Conjunctivae normal.     Pupils: Pupils are equal, round, and reactive to light.  Cardiovascular:     Rate and Rhythm: Tachycardia present. Rhythm  irregular.     Heart sounds: No murmur heard. Pulmonary:     Effort: Pulmonary effort is normal. No respiratory distress.     Breath sounds: Normal breath sounds.     Comments: On 3 L nasal cannula.  Unable to get a pulse ox.  Patient does not appear to be in acute distress. Musculoskeletal:     Cervical back: Normal range of motion and neck supple.     Right lower leg: No edema.     Left lower leg: No edema.  Skin:    General: Skin is warm and dry.  Neurological:     Mental Status: She is alert and oriented to person, place, and time. Mental status is at baseline.  Psychiatric:        Mood and Affect: Mood normal.     ED Results / Procedures / Treatments   Labs (all labs ordered are listed, but only  abnormal results are displayed) Labs Reviewed  CBC - Abnormal; Notable for the following components:      Result Value   WBC 10.8 (*)    RBC 3.83 (*)    MCV 100.5 (*)    All other components within normal limits  BRAIN NATRIURETIC PEPTIDE - Abnormal; Notable for the following components:   B Natriuretic Peptide 771.9 (*)    All other components within normal limits  COMPREHENSIVE METABOLIC PANEL WITH GFR - Abnormal; Notable for the following components:   Glucose, Bld 100 (*)    Total Protein 6.3 (*)    Albumin  3.4 (*)    All other components within normal limits  C DIFFICILE QUICK SCREEN W PCR REFLEX    GASTROINTESTINAL PANEL BY PCR, STOOL (REPLACES STOOL CULTURE)  CULTURE, BLOOD (ROUTINE X 2)  CULTURE, BLOOD (ROUTINE X 2)  MAGNESIUM   TSH  LACTIC ACID, PLASMA  HEPARIN  LEVEL (UNFRACTIONATED)  CBC  HEMOGLOBIN A1C  HEPARIN  LEVEL (UNFRACTIONATED)  COOXEMETRY PANEL  I-STAT CG4 LACTIC ACID, ED  I-STAT CG4 LACTIC ACID, ED  TROPONIN I (HIGH SENSITIVITY)  TROPONIN I (HIGH SENSITIVITY)    EKG EKG Interpretation Date/Time:  Monday Jun 24 2023 10:44:10 EDT Ventricular Rate:  158 PR Interval:  142 QRS Duration:  88 QT Interval:  291 QTC Calculation: 472 R Axis:   58  Text Interpretation: Supraventricular tachycardia Borderline T abnormalities, diffuse leads Confirmed by Shyrl Doyne [phone]) on 06/24/2023 10:45:19 AM  Radiology DG Chest Port 1 View Result Date: 06/24/2023 CLINICAL DATA:  Status post PICC EXAM: PORTABLE CHEST 1 VIEW COMPARISON:  Chest x-ray 06/24/2023 FINDINGS: Left upper extremity PICC terminates in the distal SVC. The heart is mildly enlarged. The lungs are clear. There is no pleural effusion or pneumothorax. Thoracolumbar fusion hardware is present. IMPRESSION: 1. Left upper extremity PICC terminates in the distal SVC. 2. Mild cardiomegaly. Electronically Signed   By: Tyron Gallon M.D.   On: 06/24/2023 19:43   US  EKG SITE RITE Result Date: 06/24/2023 If Site  Rite image not attached, placement could not be confirmed due to current cardiac rhythm.  ECHOCARDIOGRAM COMPLETE Result Date: 06/24/2023    ECHOCARDIOGRAM REPORT   Patient Name:   Karla Foster Name Date of Exam: 06/24/2023 Medical Rec #:  [id number]               Height:       67.0 in Accession #:    [id number]              Weight:       166.0  lb Date of Birth:  [birth year]               BSA:          1.868 m Patient Age:    84 years                BP:           106/96 mmHg Patient Gender: F                       HR:           140 bpm. Exam Location:  Inpatient Procedure: 2D Echo, Color Doppler and Cardiac Doppler (Both Spectral and Color            Flow Doppler were utilized during procedure). STAT ECHO                         REPORT CONTAINS CRITICAL RESULT   Severe MR, reduced EF, moderate-severe AR, severely elevated PASP reported to                                   Dr. Alroy Aspen. Indications:    Mitral Regurgitation i34.0  History:        Patient has prior history of Echocardiogram examinations, most                 recent 06/14/2011. COPD; Risk Factors:Hypertension and                 Dyslipidemia.  Sonographer:    Sherline Distel Senior RDCS Referring Phys: [id number] SHENG L HALEY IMPRESSIONS  1. Left ventricular ejection fraction, by estimation, is 30 to 35%. The left ventricle has moderately decreased function. The left ventricle demonstrates global hypokinesis. Left ventricular diastolic function could not be evaluated.  2. Right ventricular systolic function is moderately reduced. The right ventricular size is normal. There is severely elevated pulmonary artery systolic pressure. The estimated right ventricular systolic pressure is 65.1 mmHg.  3. Left atrial size was moderately dilated.  4. Right atrial size was mildly dilated.  5. A small pericardial effusion is present.  6. The mitral valve is abnormal. Severe mitral valve regurgitation.  7. Tricuspid valve regurgitation is moderate.  8. The aortic valve was not  well visualized. There is mild calcification of the aortic valve. There is mild thickening of the aortic valve. Aortic valve regurgitation is moderate to severe.  9. The inferior vena cava is dilated in size with <50% respiratory variability, suggesting right atrial pressure of 15 mmHg. Comparison(s): Prior images unable to be directly viewed, comparison made by report only. Changes from prior study are noted. Conclusion(s)/Recommendation(s): Reduced LVEF, severe MR, moderate-severe AR, severely elevated PA pressures are new compared to prior report. Findings communicated to Dr. Alroy Aspen. FINDINGS  Left Ventricle: Left ventricular ejection fraction, by estimation, is 30 to 35%. The left ventricle has moderately decreased function. The left ventricle demonstrates global hypokinesis. The left ventricular internal cavity size was normal in size. There is no left ventricular hypertrophy. Left ventricular diastolic function could not be evaluated due to atrial fibrillation. Left ventricular diastolic function could not be evaluated. Right Ventricle: The right ventricular size is normal. Right vetricular wall thickness was not well visualized. Right ventricular systolic function is moderately reduced. There is severely elevated pulmonary artery systolic pressure. The tricuspid regurgitant velocity is 3.54 m/s, and with  an assumed right atrial pressure of 15 mmHg, the estimated right ventricular systolic pressure is 65.1 mmHg. Left Atrium: Left atrial size was moderately dilated. Right Atrium: Right atrial size was mildly dilated. Pericardium: A small pericardial effusion is present. Mitral Valve: The mitral valve is abnormal. There is mild thickening of the mitral valve leaflet(s). There is mild calcification of the mitral valve leaflet(s). Severe mitral valve regurgitation. MV peak gradient, 28.6 mmHg. The mean mitral valve gradient is 16.0 mmHg. Tricuspid Valve: The tricuspid valve is grossly normal. Tricuspid valve  regurgitation is moderate . No evidence of tricuspid stenosis. Aortic Valve: The aortic valve was not well visualized. There is mild calcification of the aortic valve. There is mild thickening of the aortic valve. Aortic valve regurgitation is moderate to severe. Pulmonic Valve: The pulmonic valve was grossly normal. Pulmonic valve regurgitation is mild. No evidence of pulmonic stenosis. Aorta: The aortic root, ascending aorta and aortic arch are all structurally normal, with no evidence of dilitation or obstruction. Venous: The inferior vena cava is dilated in size with less than 50% respiratory variability, suggesting right atrial pressure of 15 mmHg. IAS/Shunts: The atrial septum is grossly normal.  LEFT VENTRICLE PLAX 2D LVIDd:         4.20 cm LVIDs:         3.50 cm LV PW:         0.80 cm LV IVS:        0.70 cm LVOT diam:     2.00 cm LV SV:         44 LV SV Index:   24 LVOT Area:     3.14 cm  RIGHT VENTRICLE RV S prime:     9.25 cm/s TAPSE (M-mode): 1.3 cm LEFT ATRIUM              Index        RIGHT ATRIUM           Index LA diam:        4.30 cm  2.30 cm/m   RA Area:     16.60 cm LA Vol (A2C):   113.0 ml 60.48 ml/m  RA Volume:   45.80 ml  24.51 ml/m LA Vol (A4C):   66.4 ml  35.54 ml/m LA Biplane Vol: 87.1 ml  46.62 ml/m  AORTIC VALVE LVOT Vmax:   113.50 cm/s LVOT Vmean:  82.550 cm/s LVOT VTI:    0.142 m  AORTA Ao Root diam: 2.60 cm Ao Asc diam:  3.20 cm MITRAL VALVE                  TRICUSPID VALVE MV Area VTI:  1.05 cm        TR Peak grad:   50.1 mmHg MV Peak grad: 28.6 mmHg       TR Vmax:        354.00 cm/s MV Mean grad: 16.0 mmHg MV Vmax:      2.68 m/s        SHUNTS MV Vmean:     190.5 cm/s      Systemic VTI:  0.14 m MR Peak grad:    99.8 mmHg    Systemic Diam: 2.00 cm MR Mean grad:    75.5 mmHg MR Vmax:         499.50 cm/s MR Vmean:        423.5 cm/s MR PISA:         5.09 cm MR PISA Eff ROA: 39 mm MR PISA  Radius:  0.90 cm Sheryle Donning MD Electronically signed by Sheryle Donning MD  Signature Date/Time: 06/24/2023/3:19:17 PM    Final    DG Chest Port 1 View Result Date: 06/24/2023 CLINICAL DATA:  Atrial fibrillation. Weakness and shortness of breath for 4 days. EXAM: PORTABLE CHEST 1 VIEW COMPARISON:  Radiographs 05/15/2023 and 04/27/2022. Cardiac CT 06/06/2011. FINDINGS: 1107 hours. Mild patient rotation to the right. The heart size and mediastinal contours are normal. Stable mild chronic interstitial prominence without evidence of edema, confluent airspace disease, pleural effusion or pneumothorax. No acute osseous findings are seen status post thoracolumbar fusion. IMPRESSION: No evidence of acute cardiopulmonary process. Chronic interstitial prominence. Electronically Signed   By: Elmon Hagedorn M.D.   On: 06/24/2023 11:21    Procedures Procedures    Medications Ordered in ED Medications  amiodarone (NEXTERONE) 1.8 mg/mL load via infusion 150 mg (150 mg Intravenous Bolus from Bag 06/24/23 1233)    Followed by  amiodarone (NEXTERONE PREMIX) 360-4.14 MG/200ML-% (1.8 mg/mL) IV infusion (0 mg/hr Intravenous Stopped 06/24/23 1721)    Followed by  amiodarone (NEXTERONE PREMIX) 360-4.14 MG/200ML-% (1.8 mg/mL) IV infusion (30 mg/hr Intravenous New Bag/Given 06/24/23 1749)  heparin  ADULT infusion 100 units/mL (25000 units/250mL) (1,050 Units/hr Intravenous New Bag/Given 06/24/23 1447)  digoxin (LANOXIN) tablet 0.125 mg (has no administration in time range)  gabapentin  (NEURONTIN ) capsule 300 mg (300 mg Oral Given 06/24/23 1706)  traMADol (ULTRAM) tablet 50 mg (has no administration in time range)  magnesium  sulfate IVPB 2 g 50 mL (0 g Intravenous Hold 06/24/23 1946)  furosemide  (LASIX ) injection 40 mg (40 mg Intravenous Given 06/24/23 1745)  sodium chloride  flush (NS) 0.9 % injection 10-40 mL (has no administration in time range)  sodium chloride  flush (NS) 0.9 % injection 10-40 mL (has no administration in time range)  Chlorhexidine  Gluconate Cloth 2 % PADS 6 each (has no administration in  time range)  sodium chloride  0.9 % bolus 1,000 mL (0 mLs Intravenous Stopped 06/24/23 1401)  calcium  gluconate 1 g/ 50 mL sodium chloride  IVPB (0 mg Intravenous Stopped 06/24/23 1400)  heparin  bolus via infusion 3,500 Units (3,500 Units Intravenous Bolus from Bag 06/24/23 1334)  sodium chloride  0.9 % bolus 1,000 mL (0 mLs Intravenous Stopped 06/24/23 1612)  LORazepam (ATIVAN) injection 0.5 mg (0.5 mg Intravenous Given 06/24/23 1526)  digoxin (LANOXIN) 0.25 MG/ML injection 0.25 mg (0.25 mg Intravenous Given 06/24/23 1649)  potassium chloride  SA (KLOR-CON  M) CR tablet 40 mEq (40 mEq Oral Given 06/24/23 1705)    ED Course/ Medical Decision Making/ A&P Clinical Course as of 06/24/23 2021  Mon Jun 24, 2023  1236 Dr Floria Hurst from cardiology consulted [RP]  1416 Cardiology at the bedside.  They are attempting to get a stat echo for the patient to evaluate her for mitral regurg. [RP]  1550 Cardiology to admit to CCU. [RP]    Clinical Course User Index [RP] Ninetta Basket, MD                                 Medical Decision Making Amount and/or Complexity of Data Reviewed Labs: ordered. Radiology: ordered.  Risk Prescription drug management. Decision regarding hospitalization.   Karla Foster is a 68 y.o. female with comorbidities that complicate the patient evaluation including chronic back pain, hypothyroidism, C. difficile, and palpitations who presents to the emergency department with vomiting, diarrhea, and elevated heart rate.   Initial Ddx:  Atrial fibrillation with  RVR, electrolyte abnormality, hyperthyroidism, dehydration, C. difficile, infectious diarrhea   MDM/Course:  Patient presents emergency department in atrial fibrillation with RVR and low blood pressure.  This in the setting of recent GI losses from her illness.  No longer having nausea and vomiting.  On exam is in atrial fibrillation with RVR.  No significant abdominal tenderness palpation.  Blood pressure were initially  in the low 100s.  Was given 2 L of IV fluids to replenish GI losses.  Labs were sent which did not show any significant electrolyte abnormalities that would be contributing to her symptoms.  Chest x-ray with interstitial prominence but no other acute findings.  BNP somewhat elevated.  Cardiology was consulted and initially thought that her presentation was due to GI losses but after evaluating the patient was found to have a slight murmur concerning for mitral regurgitation.  Bedside echo was performed which appeared to confirm this and formal echo was ordered.  Patient was given a dose of 2.5 mg of IV metoprolol  with some improvement of her heart rate.  Was continued on amiodarone due to soft blood pressures.  Started on anticoagulation at this time due to her CHA2DS2-VASc of 1 while she she is undergoing workup.  Cardiology will admit to their service.  This patient presents to the ED for concern of complaints listed in HPI, this involves an extensive number of treatment options, and is a complaint that carries with it a high risk of complications and morbidity. Disposition including potential need for admission considered.   Dispo: ICU  Records reviewed Outpatient Clinic Notes The following labs were independently interpreted: Chemistry and show no acute abnormality I independently reviewed the following imaging with scope of interpretation limited to determining acute life threatening conditions related to emergency care: Chest x-ray and agree with the radiologist interpretation with the following exceptions: none I personally reviewed and interpreted cardiac monitoring: atrial fibrillation with RVR I personally reviewed and interpreted the pt's EKG: see above for interpretation  I have reviewed the patients home medications and made adjustments as needed Consults: Cardiology Social Determinants of health:  Geriatric  Portions of this note were generated with Scientist, clinical (histocompatibility and immunogenetics). Dictation  errors may occur despite best attempts at proofreading.    CRITICAL CARE Performed by: Ninetta Basket   Total critical care time: 60 minutes  Critical care time was exclusive of separately billable procedures and treating other patients.  Critical care was necessary to treat or prevent imminent or life-threatening deterioration.  Critical care was time spent personally by me on the following activities: development of treatment plan with patient and/or surrogate as well as nursing, discussions with consultants, evaluation of patient's response to treatment, examination of patient, obtaining history from patient or surrogate, ordering and performing treatments and interventions, ordering and review of laboratory studies, ordering and review of radiographic studies, pulse oximetry and re-evaluation of patient's condition.  Final Clinical Impression(s) / ED Diagnoses Final diagnoses:  Atrial fibrillation with rapid ventricular response (HCC)  Gastroenteritis  Dehydration    Rx / DC Orders ED Discharge Orders          Ordered    Amb referral to AFIB Clinic        06/24/23 1052              Ninetta Basket, MD 06/24/23 2021

## 2023-06-24 NOTE — Progress Notes (Signed)
 Karla Foster is a 68 y.o. female who presents today for an office visit.  Assessment/Plan:  New/Acute Problems: Afib with RVR  Patient incidentally found to be tachycardic on exam today.  EKG revealed A-fib with RVR with ventricular rate 168.  Additionally she is hypotensive with initial blood pressure 89/67 and dropping to 83/54 on recheck 20 minutes later.  Does admit to feeling more tired and fatigued recently in addition to having episode of fluttering a couple days ago in her chest.  We called EMS for transport directly to the emergency room.    Diarrhea May have provoked her above episode and she is likely dehydrated.  Sounds like it was likely viral gastroenteritis.  We did discuss further workup including stool sample and labs however will defer further management to hospital team due to her needing emergency workup and evaluation for new onset A-fib with RVR.   Chronic Problems Addressed Today: Chronic back pain with Lumbar compression fracture and radiculopathy (HCC) She has had worsening pain for the last month or so.  We did discuss further management evaluation today prior to her above finding of A-fib including imaging and pain control.  We did order an x-ray today however this will likely need to be completed during or after her hospitalization.  I refilled her meds including gabapentin .  May consider referral back to orthopedics or PT if not improving with above.      Subjective:  HPI:  See A/P for status of chronic conditions.  Patient is here today for ED follow-up.  She was the ED last week with 2 weeks of lower back pain.  She had labs there.  No imaging.  She was started on Norco and methocarbamol  and discharged home.  She was not able to pick up these prescriptions. Pain has not improved. She is not sure if the medications are helping.  No obvious injuries though she did recently move which included a lot of packing and moving   She has also been having some  diarrhea the last week or so. No treatments tried. Stool is loose and watery. She is also having some abdominal pain. She did have some vomiting as well but this has improved the last day or two.   Patient also reports that she had an episode of fluttering in her chest a couple of days ago. Reportedly a nurse at her facility listened to her heart and told her that it was normal.  She has not had any palpitations or fluttering since then.  She does feel more tired and fatigued the last couple days.  Overall feels "unwell".  She has had more of a headache for the last day or so.  No weakness or numbness.  No obvious shortness of breath.       Objective:  Physical Exam: BP (!) 83/54   Pulse 76   Temp (!) 97.2 F (36.2 C) (Temporal)   Ht 5\' 7"  (1.702 m)   Wt 166 lb (75.3 kg)   SpO2 96%   BMI 26.00 kg/m   Gen: No acute distress, resting comfortably CV: Tachycardic. Irregular.  Pulm: Normal work of breathing, clear to auscultation bilaterally with no crackles, wheezes, or rhonchi Neuro: Grossly normal, moves all extremities Psych: Normal affect and thought content  EKG: A-fib with RVR.  Ventricular rate 168.  Time Spent: 55 minutes of total time was spent on the date of the encounter performing the following actions: chart review prior to seeing the patient, obtaining history,  performing a medically necessary exam, counseling on the treatment plan, placing orders, and documenting in our EHR.        Jinny Mounts. Daneil Dunker, MD 06/24/2023 9:42 AM

## 2023-06-24 NOTE — ED Triage Notes (Addendum)
 Pt bib [facility] from doctors office [facility]. (Pt resides at [facility] facility)Pt brother in law took to doctor due to increased weakness/diarrhea that's been progressive for about a month. Thursday patient started having sob with exertion and palpitations. No recent falls/syncope. 400mL of saline given, 18RAC     EMS VS  Highest HR 202 Bp 102/76 95 cbg  92 % RA  97% 2l   *Pt uses oxygen  at home as needed, No hx of afib.
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
## 2023-06-24 NOTE — ED Notes (Signed)
 PT is from [facility]

## 2023-06-24 NOTE — ED Notes (Signed)
 Pt in on monitor. CCMD notified.

## 2023-06-24 NOTE — Telephone Encounter (Signed)
 Copied from CRM [id number]. Topic: General - Other >> Jun 21, 2023 12:03 PM Trula Gable C wrote: Reason for CRM: [name] nurse from assisted living called stated she needs a callback soon, stated patent said she had cdiff and if true needs that confirmed , so she can be contained in her room would like a callback as soon as possible   [phone]   FYI  Patient has an OV with PCP today  Karla Foster

## 2023-06-24 NOTE — ED Notes (Signed)
 Pt being transported to cardiac cath unit.

## 2023-06-24 NOTE — ED Notes (Signed)
 Team placing PICU is bedside

## 2023-06-24 NOTE — Progress Notes (Signed)
 Echocardiogram 2D Echocardiogram has been performed.  Emmaline Haring Donice Alperin RDCS 06/24/2023, 2:59 PM  Dr Veryl Gottron notified for stat read
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
## 2023-06-24 NOTE — Telephone Encounter (Signed)
Addressed at office visit today

## 2023-06-24 NOTE — Patient Instructions (Signed)
 It was very nice to see you today!  You have a very rapid heart rate and you are in atrial fibrillation.  We are sending you to the hospital.  Return if symptoms worsen or fail to improve.   Take care, Dr Daneil Dunker  PLEASE NOTE:  If you had any lab tests, please let us  know if you have not heard back within a few days. You may see your results on mychart before we have a chance to review them but we will give you a call once they are reviewed by us .   If we ordered any referrals today, please let us  know if you have not heard from their office within the next week.   If you had any urgent prescriptions sent in today, please check with the pharmacy within an hour of our visit to make sure the prescription was transmitted appropriately.   Please try these tips to maintain a healthy lifestyle:  Eat at least 3 REAL meals and 1-2 snacks per day.  Aim for no more than 5 hours between eating.  If you eat breakfast, please do so within one hour of getting up.   Each meal should contain half fruits/vegetables, one quarter protein, and one quarter carbs (no bigger than a computer mouse)  Cut down on sweet beverages. This includes juice, soda, and sweet tea.   Drink at least 1 glass of water with each meal and aim for at least 8 glasses per day  Exercise at least 150 minutes every week.

## 2023-06-24 NOTE — ED Notes (Signed)
 Nurse is aware O2 not reading properly
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
## 2023-06-25 ENCOUNTER — Other Ambulatory Visit (HOSPITAL_COMMUNITY): Payer: Self-pay

## 2023-06-25 ENCOUNTER — Encounter (HOSPITAL_COMMUNITY): Admission: EM | Disposition: E | Payer: Self-pay | Source: Home / Self Care | Attending: Cardiology

## 2023-06-25 DIAGNOSIS — I4891 Unspecified atrial fibrillation: Secondary | ICD-10-CM | POA: Diagnosis not present

## 2023-06-25 DIAGNOSIS — I429 Cardiomyopathy, unspecified: Secondary | ICD-10-CM

## 2023-06-25 DIAGNOSIS — I5021 Acute systolic (congestive) heart failure: Secondary | ICD-10-CM | POA: Diagnosis not present

## 2023-06-25 HISTORY — PX: RIGHT/LEFT HEART CATH AND CORONARY ANGIOGRAPHY: CATH118266

## 2023-06-25 LAB — COOXEMETRY PANEL
Carboxyhemoglobin: 1.2 % (ref 0.5–1.5)
Methemoglobin: <0.7 % (ref 0.0–1.5)
O2 Saturation: 55.6 %
Total hemoglobin: 12.6 g/dL (ref 12.0–16.0)

## 2023-06-25 LAB — CBC
HCT: 37.4 % (ref 36.0–46.0)
Hemoglobin: 12 g/dL (ref 12.0–15.0)
MCH: 32.2 pg (ref 26.0–34.0)
MCHC: 32.1 g/dL (ref 30.0–36.0)
MCV: 100.3 fL — ABNORMAL HIGH (ref 80.0–100.0)
Platelets: 213 10*3/uL (ref 150–400)
RBC: 3.73 MIL/uL — ABNORMAL LOW (ref 3.87–5.11)
RDW: 13.9 % (ref 11.5–15.5)
WBC: 12.1 10*3/uL — ABNORMAL HIGH (ref 4.0–10.5)
nRBC: 0 % (ref 0.0–0.2)

## 2023-06-25 LAB — POCT I-STAT EG7
Acid-Base Excess: 2 mmol/L (ref 0.0–2.0)
Acid-Base Excess: 3 mmol/L — ABNORMAL HIGH (ref 0.0–2.0)
Bicarbonate: 29.2 mmol/L — ABNORMAL HIGH (ref 20.0–28.0)
Bicarbonate: 29.3 mmol/L — ABNORMAL HIGH (ref 20.0–28.0)
Calcium, Ion: 1.14 mmol/L — ABNORMAL LOW (ref 1.15–1.40)
Calcium, Ion: 1.16 mmol/L (ref 1.15–1.40)
HCT: 38 % (ref 36.0–46.0)
HCT: 39 % (ref 36.0–46.0)
Hemoglobin: 12.9 g/dL (ref 12.0–15.0)
Hemoglobin: 13.3 g/dL (ref 12.0–15.0)
O2 Saturation: 48 %
O2 Saturation: 51 %
Potassium: 3.3 mmol/L — ABNORMAL LOW (ref 3.5–5.1)
Potassium: 3.4 mmol/L — ABNORMAL LOW (ref 3.5–5.1)
Sodium: 141 mmol/L (ref 135–145)
Sodium: 141 mmol/L (ref 135–145)
TCO2: 31 mmol/L (ref 22–32)
TCO2: 31 mmol/L (ref 22–32)
pCO2, Ven: 52.8 mmHg (ref 44–60)
pCO2, Ven: 54.1 mmHg (ref 44–60)
pH, Ven: 7.341 (ref 7.25–7.43)
pH, Ven: 7.351 (ref 7.25–7.43)
pO2, Ven: 28 mmHg — CL (ref 32–45)
pO2, Ven: 29 mmHg — CL (ref 32–45)

## 2023-06-25 LAB — BASIC METABOLIC PANEL WITH GFR
Anion gap: 9 (ref 5–15)
BUN: 7 mg/dL — ABNORMAL LOW (ref 8–23)
CO2: 27 mmol/L (ref 22–32)
Calcium: 8.4 mg/dL — ABNORMAL LOW (ref 8.9–10.3)
Chloride: 104 mmol/L (ref 98–111)
Creatinine, Ser: 0.75 mg/dL (ref 0.44–1.00)
GFR, Estimated: >60 mL/min (ref >60)
Glucose, Bld: 109 mg/dL — ABNORMAL HIGH (ref 70–99)
Potassium: 3.5 mmol/L (ref 3.5–5.1)
Sodium: 140 mmol/L (ref 135–145)

## 2023-06-25 LAB — HEMOGLOBIN A1C
Hgb A1c MFr Bld: 5 % (ref 4.8–5.6)
Mean Plasma Glucose: 96.8 mg/dL

## 2023-06-25 LAB — HEPARIN LEVEL (UNFRACTIONATED): Heparin Unfractionated: 0.55 [IU]/mL (ref 0.30–0.70)

## 2023-06-25 SURGERY — RIGHT/LEFT HEART CATH AND CORONARY ANGIOGRAPHY
Anesthesia: LOCAL

## 2023-06-25 SURGERY — TRANSESOPHAGEAL ECHOCARDIOGRAM (TEE) (CATHLAB)
Anesthesia: Monitor Anesthesia Care

## 2023-06-25 MED ORDER — POTASSIUM CHLORIDE CRYS ER 20 MEQ PO TBCR
40.0000 meq | EXTENDED_RELEASE_TABLET | Freq: Two times a day (BID) | ORAL | Status: AC
  Administered 2023-06-25 (×2): 40 meq via ORAL
  Filled 2023-06-25 (×2): qty 2

## 2023-06-25 MED ORDER — HEPARIN SODIUM (PORCINE) 1000 UNIT/ML IJ SOLN
INTRAMUSCULAR | Status: AC
  Filled 2023-06-25: qty 10

## 2023-06-25 MED ORDER — SODIUM CHLORIDE 0.9 % IV SOLN
INTRAVENOUS | Status: DC
  Administered 2023-06-25: 20 mL/h via INTRAVENOUS

## 2023-06-25 MED ORDER — ONDANSETRON HCL 4 MG/2ML IJ SOLN: 4.0000 mg | Freq: Four times a day (QID) | INTRAMUSCULAR | Status: DC | PRN

## 2023-06-25 MED ORDER — LABETALOL HCL 5 MG/ML IV SOLN: 10.0000 mg | INTRAVENOUS | Status: AC | PRN

## 2023-06-25 MED ORDER — FENTANYL CITRATE (PF) 100 MCG/2ML IJ SOLN
INTRAMUSCULAR | Status: AC
  Filled 2023-06-25: qty 2

## 2023-06-25 MED ORDER — HYDRALAZINE HCL 20 MG/ML IJ SOLN: 10.0000 mg | INTRAMUSCULAR | Status: AC | PRN

## 2023-06-25 MED ORDER — VERAPAMIL HCL 2.5 MG/ML IV SOLN
INTRAVENOUS | Status: DC | PRN
  Administered 2023-06-25: 10 mL via INTRA_ARTERIAL

## 2023-06-25 MED ORDER — LIDOCAINE HCL (PF) 1 % IJ SOLN
INTRAMUSCULAR | Status: DC | PRN
  Administered 2023-06-25 (×2): 2 mL

## 2023-06-25 MED ORDER — SODIUM CHLORIDE 0.9% FLUSH
3.0000 mL | Freq: Two times a day (BID) | INTRAVENOUS | Status: DC
  Administered 2023-06-25 – 2023-06-30 (×8): 3 mL via INTRAVENOUS

## 2023-06-25 MED ORDER — FENTANYL CITRATE (PF) 100 MCG/2ML IJ SOLN
INTRAMUSCULAR | Status: DC | PRN
  Administered 2023-06-25: 12.5 ug via INTRAVENOUS

## 2023-06-25 MED ORDER — MAGNESIUM SULFATE 2 GM/50ML IV SOLN
2.0000 g | Freq: Once | INTRAVENOUS | Status: AC
  Administered 2023-06-25: 2 g via INTRAVENOUS
  Filled 2023-06-25: qty 50

## 2023-06-25 MED ORDER — SODIUM CHLORIDE 0.9 % IV SOLN: 250.0000 mL | INTRAVENOUS | Status: AC | PRN

## 2023-06-25 MED ORDER — ORAL CARE MOUTH RINSE: 15.0000 mL | OROMUCOSAL | Status: DC | PRN

## 2023-06-25 MED ORDER — ACETAMINOPHEN 325 MG PO TABS: 650.0000 mg | ORAL_TABLET | ORAL | Status: DC | PRN

## 2023-06-25 MED ORDER — LIDOCAINE HCL (PF) 1 % IJ SOLN
INTRAMUSCULAR | Status: AC
Start: 2023-06-25 — End: ?
  Filled 2023-06-25: qty 30

## 2023-06-25 MED ORDER — HEPARIN (PORCINE) IN NACL 2000-0.9 UNIT/L-% IV SOLN
INTRAVENOUS | Status: DC | PRN
  Administered 2023-06-25: 2000 mL

## 2023-06-25 MED ORDER — SODIUM CHLORIDE 0.9% FLUSH: 3.0000 mL | INTRAVENOUS | Status: DC | PRN

## 2023-06-25 MED ORDER — IOHEXOL 350 MG/ML SOLN
INTRAVENOUS | Status: DC | PRN
  Administered 2023-06-25: 65 mL

## 2023-06-25 MED ORDER — HEPARIN SODIUM (PORCINE) 1000 UNIT/ML IJ SOLN
INTRAMUSCULAR | Status: DC | PRN
  Administered 2023-06-25: 3000 [IU] via INTRAVENOUS

## 2023-06-25 MED ORDER — MIDAZOLAM HCL 2 MG/2ML IJ SOLN
INTRAMUSCULAR | Status: AC
  Filled 2023-06-25: qty 2

## 2023-06-25 MED ORDER — VERAPAMIL HCL 2.5 MG/ML IV SOLN
INTRAVENOUS | Status: AC
  Filled 2023-06-25: qty 2

## 2023-06-25 MED ORDER — HEPARIN (PORCINE) 25000 UT/250ML-% IV SOLN
1250.0000 [IU]/h | INTRAVENOUS | Status: DC
  Administered 2023-06-25 – 2023-06-27 (×3): 1250 [IU]/h via INTRAVENOUS
  Filled 2023-06-25 (×3): qty 250

## 2023-06-25 MED ORDER — MIDAZOLAM HCL 2 MG/2ML IJ SOLN
INTRAMUSCULAR | Status: DC | PRN
  Administered 2023-06-25: .5 mg via INTRAVENOUS

## 2023-06-25 SURGICAL SUPPLY — 12 items
CATH 5FR JL3.5 JR4 ANG PIG MP (CATHETERS) IMPLANT
CATH BALLN WEDGE 5F 110CM (CATHETERS) IMPLANT
DEVICE RAD COMP TR BAND LRG (VASCULAR PRODUCTS) IMPLANT
DEVICE RAD TR BAND REGULAR (VASCULAR PRODUCTS) IMPLANT
GLIDESHEATH SLEND SS 6F .021 (SHEATH) IMPLANT
GUIDEWIRE INQWIRE 1.5J.035X260 (WIRE) IMPLANT
KIT HEART LEFT (KITS) IMPLANT
PACK CARDIAC CATHETERIZATION (CUSTOM PROCEDURE TRAY) ×1 IMPLANT
SHEATH GLIDE SLENDER 4/5FR (SHEATH) IMPLANT
TRANSDUCER W/STOPCOCK (MISCELLANEOUS) IMPLANT
TUBING ART PRESS 72 MALE/FEM (TUBING) IMPLANT
WIRE MICROINTRODUCER 60CM (WIRE) IMPLANT

## 2023-06-25 NOTE — Interval H&P Note (Signed)
 History and Physical Interval Note:  06/25/2023 8:51 AM  Karla Foster  has presented today for surgery, with the diagnosis of congestive heart failure.  The various methods of treatment have been discussed with the patient and family. After consideration of risks, benefits and other options for treatment, the patient has consented to  Procedure(s): RIGHT/LEFT HEART CATH AND CORONARY ANGIOGRAPHY ([name]) as a surgical intervention.  The patient's history has been reviewed, patient examined, no change in status, stable for surgery.  I have reviewed the patient's chart and labs.  Questions were answered to the patient's satisfaction.     Amar Keenum [employer]

## 2023-06-25 NOTE — Progress Notes (Signed)
 Patient ID: Karla Foster, female   DOB: 12/02/1955, 68 y.o.   MRN: [id number]     Advanced Heart Failure Rounding Note  Cardiologist: Hazle Lites, MD  Chief Complaint: CHF Subjective:    Good diuresis overnight, I/Os net negative 1747 and weight down.  Breathing improved.  Still in AF with HR 110s-130s, on amiodarone 30 mg/hr.  SBP 90s-100s.  Co-ox 56% with CVP 10-11 this morning.  Lactate 1.7.    Still has some back pain. Denies dyspnea at rest.    Objective:   Weight Range: 72.9 kg Body mass index is 25.17 kg/m.   Vital Signs:   Temp:  [97.8 F (36.6 C)-98.6 F (37 C)] 97.8 F (36.6 C) (05/06 0338) Pulse Rate:  [30-178] 124 (05/06 0600) Resp:  [12-60] 18 (05/06 0600) BP: (90-155)/(67-114) 96/67 (05/06 0600) SpO2:  [83 %-100 %] 95 % (05/06 0600) Weight:  [72.9 kg-75.3 kg] 72.9 kg (05/06 0600)    Weight change: Filed Weights   06/24/23 1036 06/25/23 0600  Weight: 75.3 kg 72.9 kg    Intake/Output:   Intake/Output Summary (Last 24 hours) at 06/25/2023 0711 Last data filed at 06/25/2023 0646 Gross per 24 hour  Intake 1554.85 ml  Output 2100 ml  Net -545.15 ml      Physical Exam    General:  Well appearing. No resp difficulty HEENT: Normal Neck: Supple. JVP 12 cm. Carotids 2+ bilat; no bruits. No lymphadenopathy or thyromegaly appreciated. Cor: PMI nondisplaced. Tachy, irregular rate & rhythm. 2/6 HSM apex.  Lungs: Clear Abdomen: Soft, nontender, nondistended. No hepatosplenomegaly. No bruits or masses. Good bowel sounds. Extremities: No cyanosis, clubbing, rash. Trace ankle edema.  Neuro: Alert & orientedx3, cranial nerves grossly intact. moves all 4 extremities w/o difficulty. Affect pleasant   Telemetry   Atrial fibrillation 110s-130s (personally reviewed).   Labs    CBC Recent Labs    06/24/23 1053  WBC 10.8*  HGB 12.4  HCT 38.5  MCV 100.5*  PLT 254   Basic Metabolic Panel Recent Labs    82/95/62 1053  NA 138  K 3.9  CL 104   CO2 22  GLUCOSE 100*  BUN 10  CREATININE 0.92  CALCIUM  9.1  MG 1.7   Liver Function Tests Recent Labs    06/24/23 1053  AST 21  ALT 12  ALKPHOS 65  BILITOT 0.5  PROT 6.3*  ALBUMIN  3.4*   No results for input(s): "LIPASE", "AMYLASE" in the last 72 hours. Cardiac Enzymes No results for input(s): "CKTOTAL", "CKMB", "CKMBINDEX", "TROPONINI" in the last 72 hours.  BNP: BNP (last 3 results) Recent Labs    06/24/23 1053  BNP 771.9*    ProBNP (last 3 results) No results for input(s): "PROBNP" in the last 8760 hours.   D-Dimer No results for input(s): "DDIMER" in the last 72 hours. Hemoglobin A1C No results for input(s): "HGBA1C" in the last 72 hours. Fasting Lipid Panel No results for input(s): "CHOL", "HDL", "LDLCALC", "TRIG", "CHOLHDL", "LDLDIRECT" in the last 72 hours. Thyroid  Function Tests Recent Labs    06/24/23 1053  TSH 2.807    Other results:   Imaging    DG Chest Port 1 View Result Date: 06/24/2023 CLINICAL DATA:  Status post PICC EXAM: PORTABLE CHEST 1 VIEW COMPARISON:  Chest x-ray 06/24/2023 FINDINGS: Left upper extremity PICC terminates in the distal SVC. The heart is mildly enlarged. The lungs are clear. There is no pleural effusion or pneumothorax. Thoracolumbar fusion hardware is present. IMPRESSION: 1. Left upper extremity  PICC terminates in the distal SVC. 2. Mild cardiomegaly. Electronically Signed   By: Tyron Gallon M.D.   On: 06/24/2023 19:43   US  EKG SITE RITE Result Date: 06/24/2023 If Site Rite image not attached, placement could not be confirmed due to current cardiac rhythm.  ECHOCARDIOGRAM COMPLETE Result Date: 06/24/2023    ECHOCARDIOGRAM REPORT   Patient Name:   Karla Foster Date of Exam: 06/24/2023 Medical Rec #:  [id number]               Height:       67.0 in Accession #:    [id number]              Weight:       166.0 lb Date of Birth:  1955/10/26               BSA:          1.868 m Patient Age:    68 years                BP:            106/96 mmHg Patient Gender: F                       HR:           140 bpm. Exam Location:  Inpatient Procedure: 2D Echo, Color Doppler and Cardiac Doppler (Both Spectral and Color            Flow Doppler were utilized during procedure). STAT ECHO                         REPORT CONTAINS CRITICAL RESULT   Severe MR, reduced EF, moderate-severe AR, severely elevated PASP reported to                                   Dr. Alroy Aspen. Indications:    Mitral Regurgitation i34.0  History:        Patient has prior history of Echocardiogram examinations, most                 recent 06/14/2011. COPD; Risk Factors:Hypertension and                 Dyslipidemia.  Sonographer:    Sherline Distel Senior RDCS Referring Phys: [id number] SHENG L HALEY IMPRESSIONS  1. Left ventricular ejection fraction, by estimation, is 30 to 35%. The left ventricle has moderately decreased function. The left ventricle demonstrates global hypokinesis. Left ventricular diastolic function could not be evaluated.  2. Right ventricular systolic function is moderately reduced. The right ventricular size is normal. There is severely elevated pulmonary artery systolic pressure. The estimated right ventricular systolic pressure is 65.1 mmHg.  3. Left atrial size was moderately dilated.  4. Right atrial size was mildly dilated.  5. A small pericardial effusion is present.  6. The mitral valve is abnormal. Severe mitral valve regurgitation.  7. Tricuspid valve regurgitation is moderate.  8. The aortic valve was not well visualized. There is mild calcification of the aortic valve. There is mild thickening of the aortic valve. Aortic valve regurgitation is moderate to severe.  9. The inferior vena cava is dilated in size with <50% respiratory variability, suggesting right atrial pressure of 15 mmHg. Comparison(s): Prior images unable to be directly viewed, comparison made by report only. Changes from prior study  are noted. Conclusion(s)/Recommendation(s): Reduced LVEF,  severe MR, moderate-severe AR, severely elevated PA pressures are new compared to prior report. Findings communicated to Dr. Alroy Aspen. FINDINGS  Left Ventricle: Left ventricular ejection fraction, by estimation, is 30 to 35%. The left ventricle has moderately decreased function. The left ventricle demonstrates global hypokinesis. The left ventricular internal cavity size was normal in size. There is no left ventricular hypertrophy. Left ventricular diastolic function could not be evaluated due to atrial fibrillation. Left ventricular diastolic function could not be evaluated. Right Ventricle: The right ventricular size is normal. Right vetricular wall thickness was not well visualized. Right ventricular systolic function is moderately reduced. There is severely elevated pulmonary artery systolic pressure. The tricuspid regurgitant velocity is 3.54 m/s, and with an assumed right atrial pressure of 15 mmHg, the estimated right ventricular systolic pressure is 65.1 mmHg. Left Atrium: Left atrial size was moderately dilated. Right Atrium: Right atrial size was mildly dilated. Pericardium: A small pericardial effusion is present. Mitral Valve: The mitral valve is abnormal. There is mild thickening of the mitral valve leaflet(s). There is mild calcification of the mitral valve leaflet(s). Severe mitral valve regurgitation. MV peak gradient, 28.6 mmHg. The mean mitral valve gradient is 16.0 mmHg. Tricuspid Valve: The tricuspid valve is grossly normal. Tricuspid valve regurgitation is moderate . No evidence of tricuspid stenosis. Aortic Valve: The aortic valve was not well visualized. There is mild calcification of the aortic valve. There is mild thickening of the aortic valve. Aortic valve regurgitation is moderate to severe. Pulmonic Valve: The pulmonic valve was grossly normal. Pulmonic valve regurgitation is mild. No evidence of pulmonic stenosis. Aorta: The aortic root, ascending aorta and aortic arch are all  structurally normal, with no evidence of dilitation or obstruction. Venous: The inferior vena cava is dilated in size with less than 50% respiratory variability, suggesting right atrial pressure of 15 mmHg. IAS/Shunts: The atrial septum is grossly normal.  LEFT VENTRICLE PLAX 2D LVIDd:         4.20 cm LVIDs:         3.50 cm LV PW:         0.80 cm LV IVS:        0.70 cm LVOT diam:     2.00 cm LV SV:         44 LV SV Index:   24 LVOT Area:     3.14 cm  RIGHT VENTRICLE RV S prime:     9.25 cm/s TAPSE (M-mode): 1.3 cm LEFT ATRIUM              Index        RIGHT ATRIUM           Index LA diam:        4.30 cm  2.30 cm/m   RA Area:     16.60 cm LA Vol (A2C):   113.0 ml 60.48 ml/m  RA Volume:   45.80 ml  24.51 ml/m LA Vol (A4C):   66.4 ml  35.54 ml/m LA Biplane Vol: 87.1 ml  46.62 ml/m  AORTIC VALVE LVOT Vmax:   113.50 cm/s LVOT Vmean:  82.550 cm/s LVOT VTI:    0.142 m  AORTA Ao Root diam: 2.60 cm Ao Asc diam:  3.20 cm MITRAL VALVE                  TRICUSPID VALVE MV Area VTI:  1.05 cm        TR Peak grad:   50.1 mmHg  MV Peak grad: 28.6 mmHg       TR Vmax:        354.00 cm/s MV Mean grad: 16.0 mmHg MV Vmax:      2.68 m/s        SHUNTS MV Vmean:     190.5 cm/s      Systemic VTI:  0.14 m MR Peak grad:    99.8 mmHg    Systemic Diam: 2.00 cm MR Mean grad:    75.5 mmHg MR Vmax:         499.50 cm/s MR Vmean:        423.5 cm/s MR PISA:         5.09 cm MR PISA Eff ROA: 39 mm MR PISA Radius:  0.90 cm Sheryle Donning MD Electronically signed by Sheryle Donning MD Signature Date/Time: 06/24/2023/3:19:17 PM    Final    DG Chest Port 1 View Result Date: 06/24/2023 CLINICAL DATA:  Atrial fibrillation. Weakness and shortness of breath for 4 days. EXAM: PORTABLE CHEST 1 VIEW COMPARISON:  Radiographs 05/15/2023 and 04/27/2022. Cardiac CT 06/06/2011. FINDINGS: 1107 hours. Mild patient rotation to the right. The heart size and mediastinal contours are normal. Stable mild chronic interstitial prominence without evidence  of edema, confluent airspace disease, pleural effusion or pneumothorax. No acute osseous findings are seen status post thoracolumbar fusion. IMPRESSION: No evidence of acute cardiopulmonary process. Chronic interstitial prominence. Electronically Signed   By: Elmon Hagedorn M.D.   On: 06/24/2023 11:21     Medications:     Scheduled Medications:  atorvastatin   40 mg Oral Daily   Chlorhexidine  Gluconate Cloth  6 each Topical Daily   digoxin  0.125 mg Oral Daily   furosemide   40 mg Intravenous Q8H   gabapentin   300 mg Oral TID   pantoprazole   40 mg Oral Daily   sodium chloride  flush  10-40 mL Intracatheter Q12H    Infusions:  sodium chloride  10 mL/hr at 06/25/23 0646   amiodarone 30 mg/hr (06/25/23 0646)   heparin  1,250 Units/hr (06/25/23 0646)   magnesium  sulfate bolus IVPB Stopped (06/24/23 1946)    PRN Medications: acetaminophen , nitroGLYCERIN , ondansetron  (ZOFRAN ) IV, mouth rinse, sodium chloride  flush, traMADol    Assessment/Plan   1. Atrial fibrillation: With RVR.  Unsure how long this has been present, she has been minimally symptomatic.  She does report some palpitations about a week ago.  HR now 110s-130s with amiodarone gtt 30 mg/hr.  - Increase amiodarone to 60 mg/hr.  - Continue heparin  gtt.  - Will need TEE-guided DCCV, ideally after some diuresis given volume overload and amiodarone loading => tentatively plan for tomorrow.  - Needs BMET and Mg this morning.  - Continue digoxin 0.125 daily.   2. Acute systolic CHF:  Remote echo showed normal EF.  Echo today showed EF 30-35%, moderate RV dysfunction, PASP 65, severe MR (mechanism difficult to assess with AF/RVR), moderate TR, moderate-severe AI, IVC dilated.   Cause of cardiomyopathy is uncertain, possibly tachycardia-mediated as we are unsure how long AF has been going on.  She had negative coronary CTA in 2013 but does have family history of premature CAD and is a prior smoker.  She is diuresing well with IV Lasix ,  CVP 10-11 this morning with co-ox 56%.  - Control rate as above.  - Continue Lasix  40 mg IV every 8 hrs.  - Continue digoxin.  - RHC/LHC today to assess filling pressures, cardiac output, and coronary arteries. We discussed risks/benefits and she agrees to  the procedure.  - Once she has had some diuresis and amiodarone loading, plan TEE-guided DCCV (tomorrow).   - Will try to get PICC placed to follow CVP and co-ox.  3. Chest pain: Mild, think most likely anxiety-related as started right after PCP told her that her HR was high.  ECG without acute changes. Troponin not significantly elevated.  - Heparin  gtt starting for atrial fibrillation.  - As above, plan for coronary angiography today.  4. Chronic low back pain: History of spinal stenosis. Continue tramadol and continue home gabapentin .  5. Mitral regurgitation: Severe on echo.  Mechanism difficult to ascertain due to AF/RVR (?atrial functional MR).   - TEE tomorrow after further diuresis and hopefully slowing of rate.   CRITICAL CARE Performed by: Peder Bourdon  Total critical care time: 40 minutes  Critical care time was exclusive of separately billable procedures and treating other patients.  Critical care was necessary to treat or prevent imminent or life-threatening deterioration.  Critical care was time spent personally by me on the following activities: development of treatment plan with patient and/or surrogate as well as nursing, discussions with consultants, evaluation of patient's response to treatment, examination of patient, obtaining history from patient or surrogate, ordering and performing treatments and interventions, ordering and review of laboratory studies, ordering and review of radiographic studies, pulse oximetry and re-evaluation of patient's condition.   Length of Stay: 1  Peder Bourdon, MD  06/25/2023, 7:11 AM  Advanced Heart Failure Team Pager [phone] (M-F; 7a - 5p)  Please contact [facility] for  night-coverage after hours (5p -7a ) and weekends on [email or web address]

## 2023-06-25 NOTE — Evaluation (Signed)
 Physical Therapy Evaluation Patient Details Name: Karla Foster MRN: [id number] DOB: [birth year] Today's Date: 06/25/2023  History of Present Illness  Pt is a 68yo female with a-fib RVR, tachycardia, and hypotensive. Pt underwent R heart cath on 5/6. PMH: chronic back pain, lumbar compression fx and radiculopathy  Clinical Impression  Pt admitted with above. PTA pt lives with spouse at [facility] and is mod I with use of rollator. Pt assist spouse with all ADLs. Pt mobility limited to chair this date due to inability to use R UE for today due to undergoing R heart cath. Anticipate pt will progress well however she will need to be at safe mod I level of function with rollator prior to returning to ALF. Acute PT to cont to follow.        If plan is discharge home, recommend the following: A little help with walking and/or transfers;A little help with bathing/dressing/bathroom;Assist for transportation   Can travel by private vehicle        Equipment Recommendations None recommended by PT  Recommendations for Other Services       Functional Status Assessment       Precautions / Restrictions Precautions Precautions: Fall Precaution/Restrictions Comments: can't use R UE for remainder of the day on 5/6 d/t R heart cath Restrictions Other Position/Activity Restrictions: no use of R UE until tomorrow (5/7) morning      Mobility  Bed Mobility Overal bed mobility: Needs Assistance Bed Mobility: Supine to Sit     Supine to sit: Mod assist     General bed mobility comments: HOB elevated, pt able to move LEs over to EOB, modA for trunk elevation and to bring hips to EOB due to inability to use R UE per precautions    Transfers Overall transfer level: Needs assistance Equipment used: 1 person hand held assist Transfers: Sit to/from Stand, Bed to chair/wheelchair/BSC Sit to Stand: Min assist   Step pivot transfers: Mod assist       General transfer comment: HHA  provided through L UE, pt with wide base of support and tenative steps due to first time up    Ambulation/Gait               General Gait Details: limited to chair this date  Stairs            Wheelchair Mobility     Tilt Bed    Modified Rankin (Stroke Patients Only)       Balance Overall balance assessment: Needs assistance Sitting-balance support: Feet supported, No upper extremity supported Sitting balance-Leahy Scale: Good     Standing balance support: Single extremity supported, During functional activity Standing balance-Leahy Scale: Fair Standing balance comment: unable to use R UE                             Pertinent Vitals/Pain Pain Assessment Pain Assessment: No/denies pain    Home Living Family/patient expects to be discharged to:: Assisted living                 Home Equipment: Rollator (4 wheels);Shower seat Additional Comments: pt reports living with her husband in [facility]    Prior Function Prior Level of Function : Independent/Modified Independent             Mobility Comments: uses rollator, cleans room, meals are prepared for them ADLs Comments: indep, assists spouse with ADLs     Extremity/Trunk Assessment  Upper Extremity Assessment Upper Extremity Assessment: Right hand dominant;RUE deficits/detail (unable to use until 5/7)    Lower Extremity Assessment Lower Extremity Assessment: Generalized weakness    Cervical / Trunk Assessment Cervical / Trunk Assessment: Kyphotic  Communication   Communication Communication: No apparent difficulties    Cognition Arousal: Alert Behavior During Therapy: WFL for tasks assessed/performed   PT - Cognitive impairments: No apparent impairments                         Following commands: Intact       Cueing Cueing Techniques: Verbal cues     General Comments General comments (skin integrity, edema, etc.): pt with R UE bruising in elbow,  HR in 110s    Exercises     Assessment/Plan    PT Assessment Patient needs continued PT services  PT Problem List         PT Treatment Interventions DME instruction;Gait training;Functional mobility training;Therapeutic activities;Therapeutic exercise    PT Goals (Current goals can be found in the Care Plan section)  Acute Rehab PT Goals Patient Stated Goal: home PT Goal Formulation: With patient Time For Goal Achievement: 07/09/23 Potential to Achieve Goals: Good    Frequency Min 2X/week     Co-evaluation               AM-PAC PT "6 Clicks" Mobility  Outcome Measure Help needed turning from your back to your side while in a flat bed without using bedrails?: A Little Help needed moving from lying on your back to sitting on the side of a flat bed without using bedrails?: A Little Help needed moving to and from a bed to a chair (including a wheelchair)?: A Little Help needed standing up from a chair using your arms (e.g., wheelchair or bedside chair)?: A Little Help needed to walk in hospital room?: A Little Help needed climbing 3-5 steps with a railing? : A Lot 6 Click Score: 17    End of Session Equipment Utilized During Treatment: Oxygen  Activity Tolerance: Patient tolerated treatment well Patient left: in chair;with call bell/phone within reach;with nursing/sitter in room;with family/visitor present Nurse Communication: Mobility status PT Visit Diagnosis: Unsteadiness on feet (R26.81);Muscle weakness (generalized) (M62.81)    Time: 1410-1433 PT Time Calculation (min) (ACUTE ONLY): 23 min   Charges:   PT Evaluation $PT Eval Low Complexity: 1 Low PT Treatments $Therapeutic Activity: 8-22 mins PT General Charges $$ ACUTE PT VISIT: 1 Visit         Renaee Caro, PT, DPT Acute Rehabilitation Services Secure chat preferred Office #: [phone]   Jenna Moan 06/25/2023, 2:51 PM

## 2023-06-25 NOTE — Progress Notes (Signed)
 Returned to unit. TR band in place with 16cc of air.. Reverse barbeau C. Monitoring continuously.

## 2023-06-25 NOTE — Anesthesia Preprocedure Evaluation (Signed)
 Anesthesia Evaluation  Patient identified by MRN, date of birth, ID band Patient awake    Reviewed: Allergy & Precautions, NPO status , Patient's Chart, lab work & pertinent test results  Airway Mallampati: II  TM Distance: >3 FB Neck ROM: Full    Dental  (+) Edentulous Upper, Edentulous Lower, Dental Advisory Given   Pulmonary COPD, Patient abstained from smoking., former smoker   Pulmonary exam normal breath sounds clear to auscultation       Cardiovascular hypertension, +CHF  Normal cardiovascular exam+ dysrhythmias (on amio gtt) Atrial Fibrillation + Valvular Problems/Murmurs (severe MR) MR  Rhythm:Irregular Rate:Normal  TTE 2025 1. Left ventricular ejection fraction, by estimation, is 30 to 35%. The  left ventricle has moderately decreased function. The left ventricle  demonstrates global hypokinesis. Left ventricular diastolic function could  not be evaluated.   2. Right ventricular systolic function is moderately reduced. The right  ventricular size is normal. There is severely elevated pulmonary artery  systolic pressure. The estimated right ventricular systolic pressure is  65.1 mmHg.   3. Left atrial size was moderately dilated.   4. Right atrial size was mildly dilated.   5. A small pericardial effusion is present.   6. The mitral valve is abnormal. Severe mitral valve regurgitation.   7. Tricuspid valve regurgitation is moderate.   8. The aortic valve was not well visualized. There is mild calcification  of the aortic valve. There is mild thickening of the aortic valve. Aortic  valve regurgitation is moderate to severe.   9. The inferior vena cava is dilated in size with <50% respiratory  variability, suggesting right atrial pressure of 15 mmHg.   Cath 06/2023 1. Normal coronaries, nonischemic cardiomyopathy.  2. Elevated PCWP though LVEDP was not significantly elevated (discrepancy may be related to mitral  regurgitation).  3. Borderline elevated right-sided filling pressures.  4. Low CI at 1.9 5. Mild pulmonary venous hypertension.      Neuro/Psych  Headaches PSYCHIATRIC DISORDERS Anxiety Depression       GI/Hepatic Neg liver ROS,GERD  ,,  Endo/Other   Hyperthyroidism   Renal/GU negative Renal ROS  negative genitourinary   Musculoskeletal  (+) Arthritis ,    Abdominal   Peds  Hematology negative hematology ROS (+)   Anesthesia Other Findings   Reproductive/Obstetrics                             Anesthesia Physical Anesthesia Plan  ASA: 4  Anesthesia Plan: MAC   Post-op Pain Management:    Induction: Intravenous  PONV Risk Score and Plan: Propofol  infusion and Treatment may vary due to age or medical condition  Airway Management Planned: Natural Airway  Additional Equipment:   Intra-op Plan:   Post-operative Plan:   Informed Consent: I have reviewed the patients History and Physical, chart, labs and discussed the procedure including the risks, benefits and alternatives for the proposed anesthesia with the patient or authorized representative who has indicated his/her understanding and acceptance.     Dental advisory given  Plan Discussed with: CRNA  Anesthesia Plan Comments:        Anesthesia Quick Evaluation

## 2023-06-25 NOTE — Progress Notes (Signed)
 Off unit to cath lab. Monitored transport.

## 2023-06-25 NOTE — H&P (View-Only) (Signed)
 Patient ID: Karla Foster, female   DOB: 12/02/1955, 68 y.o.   MRN: [id number]     Advanced Heart Failure Rounding Note  Cardiologist: Hazle Lites, MD  Chief Complaint: CHF Subjective:    Good diuresis overnight, I/Os net negative 1747 and weight down.  Breathing improved.  Still in AF with HR 110s-130s, on amiodarone 30 mg/hr.  SBP 90s-100s.  Co-ox 56% with CVP 10-11 this morning.  Lactate 1.7.    Still has some back pain. Denies dyspnea at rest.    Objective:   Weight Range: 72.9 kg Body mass index is 25.17 kg/m.   Vital Signs:   Temp:  [97.8 F (36.6 C)-98.6 F (37 C)] 97.8 F (36.6 C) (05/06 0338) Pulse Rate:  [30-178] 124 (05/06 0600) Resp:  [12-60] 18 (05/06 0600) BP: (90-155)/(67-114) 96/67 (05/06 0600) SpO2:  [83 %-100 %] 95 % (05/06 0600) Weight:  [72.9 kg-75.3 kg] 72.9 kg (05/06 0600)    Weight change: Filed Weights   06/24/23 1036 06/25/23 0600  Weight: 75.3 kg 72.9 kg    Intake/Output:   Intake/Output Summary (Last 24 hours) at 06/25/2023 0711 Last data filed at 06/25/2023 0646 Gross per 24 hour  Intake 1554.85 ml  Output 2100 ml  Net -545.15 ml      Physical Exam    General:  Well appearing. No resp difficulty HEENT: Normal Neck: Supple. JVP 12 cm. Carotids 2+ bilat; no bruits. No lymphadenopathy or thyromegaly appreciated. Cor: PMI nondisplaced. Tachy, irregular rate & rhythm. 2/6 HSM apex.  Lungs: Clear Abdomen: Soft, nontender, nondistended. No hepatosplenomegaly. No bruits or masses. Good bowel sounds. Extremities: No cyanosis, clubbing, rash. Trace ankle edema.  Neuro: Alert & orientedx3, cranial nerves grossly intact. moves all 4 extremities w/o difficulty. Affect pleasant   Telemetry   Atrial fibrillation 110s-130s (personally reviewed).   Labs    CBC Recent Labs    06/24/23 1053  WBC 10.8*  HGB 12.4  HCT 38.5  MCV 100.5*  PLT 254   Basic Metabolic Panel Recent Labs    82/95/62 1053  NA 138  K 3.9  CL 104   CO2 22  GLUCOSE 100*  BUN 10  CREATININE 0.92  CALCIUM  9.1  MG 1.7   Liver Function Tests Recent Labs    06/24/23 1053  AST 21  ALT 12  ALKPHOS 65  BILITOT 0.5  PROT 6.3*  ALBUMIN  3.4*   No results for input(s): "LIPASE", "AMYLASE" in the last 72 hours. Cardiac Enzymes No results for input(s): "CKTOTAL", "CKMB", "CKMBINDEX", "TROPONINI" in the last 72 hours.  BNP: BNP (last 3 results) Recent Labs    06/24/23 1053  BNP 771.9*    ProBNP (last 3 results) No results for input(s): "PROBNP" in the last 8760 hours.   D-Dimer No results for input(s): "DDIMER" in the last 72 hours. Hemoglobin A1C No results for input(s): "HGBA1C" in the last 72 hours. Fasting Lipid Panel No results for input(s): "CHOL", "HDL", "LDLCALC", "TRIG", "CHOLHDL", "LDLDIRECT" in the last 72 hours. Thyroid  Function Tests Recent Labs    06/24/23 1053  TSH 2.807    Other results:   Imaging    DG Chest Port 1 View Result Date: 06/24/2023 CLINICAL DATA:  Status post PICC EXAM: PORTABLE CHEST 1 VIEW COMPARISON:  Chest x-ray 06/24/2023 FINDINGS: Left upper extremity PICC terminates in the distal SVC. The heart is mildly enlarged. The lungs are clear. There is no pleural effusion or pneumothorax. Thoracolumbar fusion hardware is present. IMPRESSION: 1. Left upper extremity  PICC terminates in the distal SVC. 2. Mild cardiomegaly. Electronically Signed   By: Tyron Gallon M.D.   On: 06/24/2023 19:43   US  EKG SITE RITE Result Date: 06/24/2023 If Site Rite image not attached, placement could not be confirmed due to current cardiac rhythm.  ECHOCARDIOGRAM COMPLETE Result Date: 06/24/2023    ECHOCARDIOGRAM REPORT   Patient Name:   Karla Foster Date of Exam: 06/24/2023 Medical Rec #:  [id number]               Height:       67.0 in Accession #:    [id number]              Weight:       166.0 lb Date of Birth:  1955/10/26               BSA:          1.868 m Patient Age:    68 years                BP:            106/96 mmHg Patient Gender: F                       HR:           140 bpm. Exam Location:  Inpatient Procedure: 2D Echo, Color Doppler and Cardiac Doppler (Both Spectral and Color            Flow Doppler were utilized during procedure). STAT ECHO                         REPORT CONTAINS CRITICAL RESULT   Severe MR, reduced EF, moderate-severe AR, severely elevated PASP reported to                                   Dr. Alroy Aspen. Indications:    Mitral Regurgitation i34.0  History:        Patient has prior history of Echocardiogram examinations, most                 recent 06/14/2011. COPD; Risk Factors:Hypertension and                 Dyslipidemia.  Sonographer:    Sherline Distel Senior RDCS Referring Phys: [id number] SHENG L HALEY IMPRESSIONS  1. Left ventricular ejection fraction, by estimation, is 30 to 35%. The left ventricle has moderately decreased function. The left ventricle demonstrates global hypokinesis. Left ventricular diastolic function could not be evaluated.  2. Right ventricular systolic function is moderately reduced. The right ventricular size is normal. There is severely elevated pulmonary artery systolic pressure. The estimated right ventricular systolic pressure is 65.1 mmHg.  3. Left atrial size was moderately dilated.  4. Right atrial size was mildly dilated.  5. A small pericardial effusion is present.  6. The mitral valve is abnormal. Severe mitral valve regurgitation.  7. Tricuspid valve regurgitation is moderate.  8. The aortic valve was not well visualized. There is mild calcification of the aortic valve. There is mild thickening of the aortic valve. Aortic valve regurgitation is moderate to severe.  9. The inferior vena cava is dilated in size with <50% respiratory variability, suggesting right atrial pressure of 15 mmHg. Comparison(s): Prior images unable to be directly viewed, comparison made by report only. Changes from prior study  are noted. Conclusion(s)/Recommendation(s): Reduced LVEF,  severe MR, moderate-severe AR, severely elevated PA pressures are new compared to prior report. Findings communicated to Dr. Alroy Aspen. FINDINGS  Left Ventricle: Left ventricular ejection fraction, by estimation, is 30 to 35%. The left ventricle has moderately decreased function. The left ventricle demonstrates global hypokinesis. The left ventricular internal cavity size was normal in size. There is no left ventricular hypertrophy. Left ventricular diastolic function could not be evaluated due to atrial fibrillation. Left ventricular diastolic function could not be evaluated. Right Ventricle: The right ventricular size is normal. Right vetricular wall thickness was not well visualized. Right ventricular systolic function is moderately reduced. There is severely elevated pulmonary artery systolic pressure. The tricuspid regurgitant velocity is 3.54 m/s, and with an assumed right atrial pressure of 15 mmHg, the estimated right ventricular systolic pressure is 65.1 mmHg. Left Atrium: Left atrial size was moderately dilated. Right Atrium: Right atrial size was mildly dilated. Pericardium: A small pericardial effusion is present. Mitral Valve: The mitral valve is abnormal. There is mild thickening of the mitral valve leaflet(s). There is mild calcification of the mitral valve leaflet(s). Severe mitral valve regurgitation. MV peak gradient, 28.6 mmHg. The mean mitral valve gradient is 16.0 mmHg. Tricuspid Valve: The tricuspid valve is grossly normal. Tricuspid valve regurgitation is moderate . No evidence of tricuspid stenosis. Aortic Valve: The aortic valve was not well visualized. There is mild calcification of the aortic valve. There is mild thickening of the aortic valve. Aortic valve regurgitation is moderate to severe. Pulmonic Valve: The pulmonic valve was grossly normal. Pulmonic valve regurgitation is mild. No evidence of pulmonic stenosis. Aorta: The aortic root, ascending aorta and aortic arch are all  structurally normal, with no evidence of dilitation or obstruction. Venous: The inferior vena cava is dilated in size with less than 50% respiratory variability, suggesting right atrial pressure of 15 mmHg. IAS/Shunts: The atrial septum is grossly normal.  LEFT VENTRICLE PLAX 2D LVIDd:         4.20 cm LVIDs:         3.50 cm LV PW:         0.80 cm LV IVS:        0.70 cm LVOT diam:     2.00 cm LV SV:         44 LV SV Index:   24 LVOT Area:     3.14 cm  RIGHT VENTRICLE RV S prime:     9.25 cm/s TAPSE (M-mode): 1.3 cm LEFT ATRIUM              Index        RIGHT ATRIUM           Index LA diam:        4.30 cm  2.30 cm/m   RA Area:     16.60 cm LA Vol (A2C):   113.0 ml 60.48 ml/m  RA Volume:   45.80 ml  24.51 ml/m LA Vol (A4C):   66.4 ml  35.54 ml/m LA Biplane Vol: 87.1 ml  46.62 ml/m  AORTIC VALVE LVOT Vmax:   113.50 cm/s LVOT Vmean:  82.550 cm/s LVOT VTI:    0.142 m  AORTA Ao Root diam: 2.60 cm Ao Asc diam:  3.20 cm MITRAL VALVE                  TRICUSPID VALVE MV Area VTI:  1.05 cm        TR Peak grad:   50.1 mmHg  MV Peak grad: 28.6 mmHg       TR Vmax:        354.00 cm/s MV Mean grad: 16.0 mmHg MV Vmax:      2.68 m/s        SHUNTS MV Vmean:     190.5 cm/s      Systemic VTI:  0.14 m MR Peak grad:    99.8 mmHg    Systemic Diam: 2.00 cm MR Mean grad:    75.5 mmHg MR Vmax:         499.50 cm/s MR Vmean:        423.5 cm/s MR PISA:         5.09 cm MR PISA Eff ROA: 39 mm MR PISA Radius:  0.90 cm Sheryle Donning MD Electronically signed by Sheryle Donning MD Signature Date/Time: 06/24/2023/3:19:17 PM    Final    DG Chest Port 1 View Result Date: 06/24/2023 CLINICAL DATA:  Atrial fibrillation. Weakness and shortness of breath for 4 days. EXAM: PORTABLE CHEST 1 VIEW COMPARISON:  Radiographs 05/15/2023 and 04/27/2022. Cardiac CT 06/06/2011. FINDINGS: 1107 hours. Mild patient rotation to the right. The heart size and mediastinal contours are normal. Stable mild chronic interstitial prominence without evidence  of edema, confluent airspace disease, pleural effusion or pneumothorax. No acute osseous findings are seen status post thoracolumbar fusion. IMPRESSION: No evidence of acute cardiopulmonary process. Chronic interstitial prominence. Electronically Signed   By: Elmon Hagedorn M.D.   On: 06/24/2023 11:21     Medications:     Scheduled Medications:  atorvastatin   40 mg Oral Daily   Chlorhexidine  Gluconate Cloth  6 each Topical Daily   digoxin  0.125 mg Oral Daily   furosemide   40 mg Intravenous Q8H   gabapentin   300 mg Oral TID   pantoprazole   40 mg Oral Daily   sodium chloride  flush  10-40 mL Intracatheter Q12H    Infusions:  sodium chloride  10 mL/hr at 06/25/23 0646   amiodarone 30 mg/hr (06/25/23 0646)   heparin  1,250 Units/hr (06/25/23 0646)   magnesium  sulfate bolus IVPB Stopped (06/24/23 1946)    PRN Medications: acetaminophen , nitroGLYCERIN , ondansetron  (ZOFRAN ) IV, mouth rinse, sodium chloride  flush, traMADol    Assessment/Plan   1. Atrial fibrillation: With RVR.  Unsure how long this has been present, she has been minimally symptomatic.  She does report some palpitations about a week ago.  HR now 110s-130s with amiodarone gtt 30 mg/hr.  - Increase amiodarone to 60 mg/hr.  - Continue heparin  gtt.  - Will need TEE-guided DCCV, ideally after some diuresis given volume overload and amiodarone loading => tentatively plan for tomorrow.  - Needs BMET and Mg this morning.  - Continue digoxin 0.125 daily.   2. Acute systolic CHF:  Remote echo showed normal EF.  Echo today showed EF 30-35%, moderate RV dysfunction, PASP 65, severe MR (mechanism difficult to assess with AF/RVR), moderate TR, moderate-severe AI, IVC dilated.   Cause of cardiomyopathy is uncertain, possibly tachycardia-mediated as we are unsure how long AF has been going on.  She had negative coronary CTA in 2013 but does have family history of premature CAD and is a prior smoker.  She is diuresing well with IV Lasix ,  CVP 10-11 this morning with co-ox 56%.  - Control rate as above.  - Continue Lasix  40 mg IV every 8 hrs.  - Continue digoxin.  - RHC/LHC today to assess filling pressures, cardiac output, and coronary arteries. We discussed risks/benefits and she agrees to  the procedure.  - Once she has had some diuresis and amiodarone loading, plan TEE-guided DCCV (tomorrow).   - Will try to get PICC placed to follow CVP and co-ox.  3. Chest pain: Mild, think most likely anxiety-related as started right after PCP told her that her HR was high.  ECG without acute changes. Troponin not significantly elevated.  - Heparin  gtt starting for atrial fibrillation.  - As above, plan for coronary angiography today.  4. Chronic low back pain: History of spinal stenosis. Continue tramadol and continue home gabapentin .  5. Mitral regurgitation: Severe on echo.  Mechanism difficult to ascertain due to AF/RVR (?atrial functional MR).   - TEE tomorrow after further diuresis and hopefully slowing of rate.   CRITICAL CARE Performed by: Peder Bourdon  Total critical care time: 40 minutes  Critical care time was exclusive of separately billable procedures and treating other patients.  Critical care was necessary to treat or prevent imminent or life-threatening deterioration.  Critical care was time spent personally by me on the following activities: development of treatment plan with patient and/or surrogate as well as nursing, discussions with consultants, evaluation of patient's response to treatment, examination of patient, obtaining history from patient or surrogate, ordering and performing treatments and interventions, ordering and review of laboratory studies, ordering and review of radiographic studies, pulse oximetry and re-evaluation of patient's condition.   Length of Stay: 1  Peder Bourdon, MD  06/25/2023, 7:11 AM  Advanced Heart Failure Team Pager [phone] (M-F; 7a - 5p)  Please contact [facility] for  night-coverage after hours (5p -7a ) and weekends on [email or web address]

## 2023-06-25 NOTE — TOC Initial Note (Signed)
 Transition of Care [facility]) - Initial/Assessment Note    Patient Details  Name: Karla Foster MRN: [id number] Date of Birth: [birth year]  [facility] CM/SW Contact:    Benjiman Bras, RN Phone Number: [phone] 06/25/2023, 4:12 PM  Clinical Narrative:                  TOC CM spoke to pt and she lives at [facility]. Spoke to [facility][phone] and they will need to come and do an assessment before she returns to ALF. Will need CSW to call facility, [facility] [phone] fax [phone]. Fax dc summary and FL2 needed. Pt gave permission to create FL2.   She has RW and oxygen  at facility. Would like to return.     Expected Discharge Plan: Assisted Living Barriers to Discharge: Continued Medical Work up   Patient Goals and CMS Choice Patient states their goals for this hospitalization and ongoing recovery are:: wants to remain independent CMS Medicare.gov Compare Post Acute Care list provided to:: Patient Choice offered to / list presented to : Patient      Expected Discharge Plan and Services In-house Referral: Clinical Social Work Discharge Planning Services: CM Consult Post Acute Care Choice: Home Health Living arrangements for the past 2 months: Assisted Living Facility                                      Prior Living Arrangements/Services Living arrangements for the past 2 months: Assisted Living Facility Lives with:: Facility Resident Patient language and need for interpreter reviewed:: Yes Do you feel safe going back to the place where you live?: Yes      Need for Family Participation in Patient Care: Yes (Comment) Care giver support system in place?: Yes (comment) Current home services: DME (rolling walker, oxygen ) Criminal Activity/Legal Involvement Pertinent to Current Situation/Hospitalization: No - Comment as needed  Activities of Daily Living      Permission Sought/Granted Permission sought to share information  with : Case Manager, Family Supports, PCP Permission granted to share information with : Yes, Verbal Permission Granted  Share Information with NAME: Netty Barba  Permission granted to share info w AGENCY: ALF  Permission granted to share info w Relationship: sister  Permission granted to share info w Contact Information: [phone]  Emotional Assessment Appearance:: Appears stated age Attitude/Demeanor/Rapport: Engaged Affect (typically observed): Accepting Orientation: : Oriented to Self, Oriented to Place, Oriented to  Time, Oriented to Situation   Psych Involvement: No (comment)  Admission diagnosis:  Dehydration [E86.0] Gastroenteritis [K52.9] Atrial fibrillation with rapid ventricular response (HCC) [I48.91] Atrial fibrillation with RVR (HCC) [I48.91] Patient Active Problem List   Diagnosis Date Noted   Atrial fibrillation with RVR (HCC) 06/24/2023   Poor social situation 10/03/2022   Tremor 06/26/2022   Dizziness 04/27/2022   Overweight 03/17/2021   Constipation 07/01/2018   GERD (gastroesophageal reflux disease)    COPD (chronic obstructive pulmonary disease) (HCC) 07/26/2016   Chronic back pain with Lumbar compression fracture and radiculopathy (HCC) 07/10/2016   Major depressive disorder, single episode, in remission [facility])    Family history of coronary artery disease, F-CABG 17's, brother died of MI 20 06/28/11   Dyslipidemia, she does not take meds 28-Jun-2011   Anxiety Jun 28, 2011   Nicotine  dependence with current use 28-Jun-2011   PCP:  Rodney Clamp, MD Pharmacy:   [id number] [place],  - [address] [place] [address] Phone: [phone] Fax: [phone]     Social Drivers of Health (SDOH) Social History: SDOH Screenings   Food Insecurity: No Food Insecurity (01/29/2023)  Housing: Low Risk  (01/29/2023)  Transportation Needs: No Transportation Needs (01/29/2023)  Recent Concern: Transportation  Needs - Unmet Transportation Needs (01/14/2023)  Utilities: Not At Risk (01/29/2023)  Depression (PHQ2-9): Medium Risk (10/03/2022)  Financial Resource Strain: Low Risk  (12/25/2022)  Physical Activity: Insufficiently Active (05/15/2022)  Social Connections: Moderately Integrated (05/12/2021)  Stress: Stress Concern Present (05/15/2022)  Tobacco Use: Medium Risk (06/24/2023)   SDOH Interventions:     Readmission Risk Interventions     No data to display
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
## 2023-06-25 NOTE — Progress Notes (Signed)
   Remains in A Fib RVR.   Scheduled for TEE/DC-CV tomorrow at 1030 by Dr Mitzie Anda.   Nieves Bars NP-C 9:21 AM'

## 2023-06-25 NOTE — Plan of Care (Signed)

## 2023-06-26 ENCOUNTER — Encounter (HOSPITAL_COMMUNITY): Payer: Self-pay | Admitting: Cardiology

## 2023-06-26 ENCOUNTER — Inpatient Hospital Stay (HOSPITAL_COMMUNITY): Admitting: Anesthesiology

## 2023-06-26 ENCOUNTER — Encounter (HOSPITAL_COMMUNITY): Admission: EM | Disposition: E | Payer: Self-pay | Source: Home / Self Care | Attending: Cardiology

## 2023-06-26 ENCOUNTER — Inpatient Hospital Stay (HOSPITAL_COMMUNITY)

## 2023-06-26 DIAGNOSIS — I340 Nonrheumatic mitral (valve) insufficiency: Secondary | ICD-10-CM

## 2023-06-26 DIAGNOSIS — I509 Heart failure, unspecified: Secondary | ICD-10-CM | POA: Diagnosis not present

## 2023-06-26 DIAGNOSIS — I4891 Unspecified atrial fibrillation: Secondary | ICD-10-CM

## 2023-06-26 DIAGNOSIS — I351 Nonrheumatic aortic (valve) insufficiency: Secondary | ICD-10-CM

## 2023-06-26 DIAGNOSIS — I361 Nonrheumatic tricuspid (valve) insufficiency: Secondary | ICD-10-CM

## 2023-06-26 DIAGNOSIS — I5021 Acute systolic (congestive) heart failure: Secondary | ICD-10-CM | POA: Diagnosis not present

## 2023-06-26 DIAGNOSIS — I110 Hypertensive heart disease with heart failure: Secondary | ICD-10-CM | POA: Diagnosis not present

## 2023-06-26 DIAGNOSIS — Z87891 Personal history of nicotine dependence: Secondary | ICD-10-CM

## 2023-06-26 HISTORY — PX: CARDIOVERSION: EP1203

## 2023-06-26 HISTORY — PX: TRANSESOPHAGEAL ECHOCARDIOGRAM (CATH LAB): EP1270

## 2023-06-26 LAB — CBC
HCT: 36.2 % (ref 36.0–46.0)
Hemoglobin: 11.8 g/dL — ABNORMAL LOW (ref 12.0–15.0)
MCH: 32.2 pg (ref 26.0–34.0)
MCHC: 32.6 g/dL (ref 30.0–36.0)
MCV: 98.9 fL (ref 80.0–100.0)
Platelets: 193 10*3/uL (ref 150–400)
RBC: 3.66 MIL/uL — ABNORMAL LOW (ref 3.87–5.11)
RDW: 13.7 % (ref 11.5–15.5)
WBC: 12.1 10*3/uL — ABNORMAL HIGH (ref 4.0–10.5)
nRBC: 0 % (ref 0.0–0.2)

## 2023-06-26 LAB — BASIC METABOLIC PANEL WITH GFR
Anion gap: 10 (ref 5–15)
BUN: <5 mg/dL — ABNORMAL LOW (ref 8–23)
CO2: 28 mmol/L (ref 22–32)
Calcium: 7.6 mg/dL — ABNORMAL LOW (ref 8.9–10.3)
Chloride: 99 mmol/L (ref 98–111)
Creatinine, Ser: 0.86 mg/dL (ref 0.44–1.00)
GFR, Estimated: >60 mL/min (ref >60)
Glucose, Bld: 265 mg/dL — ABNORMAL HIGH (ref 70–99)
Potassium: 3.3 mmol/L — ABNORMAL LOW (ref 3.5–5.1)
Sodium: 137 mmol/L (ref 135–145)

## 2023-06-26 LAB — GLUCOSE, CAPILLARY
Glucose-Capillary: 127 mg/dL — ABNORMAL HIGH (ref 70–99)
Glucose-Capillary: 251 mg/dL — ABNORMAL HIGH (ref 70–99)
Glucose-Capillary: 107 mg/dL — ABNORMAL HIGH (ref 70–99)

## 2023-06-26 LAB — COOXEMETRY PANEL
Carboxyhemoglobin: 1.3 % (ref 0.5–1.5)
Methemoglobin: <0.7 % (ref 0.0–1.5)
O2 Saturation: 65.8 %
Total hemoglobin: 11.9 g/dL — ABNORMAL LOW (ref 12.0–16.0)

## 2023-06-26 LAB — POTASSIUM: Potassium: 3.8 mmol/L (ref 3.5–5.1)

## 2023-06-26 LAB — HEPARIN LEVEL (UNFRACTIONATED): Heparin Unfractionated: 0.35 [IU]/mL (ref 0.30–0.70)

## 2023-06-26 LAB — ECHO TEE: Est EF: 35

## 2023-06-26 LAB — MAGNESIUM: Magnesium: 1.7 mg/dL (ref 1.7–2.4)

## 2023-06-26 SURGERY — TRANSESOPHAGEAL ECHOCARDIOGRAM (TEE) (CATHLAB)
Anesthesia: Monitor Anesthesia Care

## 2023-06-26 MED ORDER — INSULIN ASPART 100 UNIT/ML IJ SOLN: 0.0000 [IU] | Freq: Every day | INTRAMUSCULAR | Status: DC

## 2023-06-26 MED ORDER — PROPOFOL 10 MG/ML IV BOLUS
INTRAVENOUS | Status: DC | PRN
  Administered 2023-06-26: 30 mg via INTRAVENOUS

## 2023-06-26 MED ORDER — POTASSIUM CHLORIDE CRYS ER 20 MEQ PO TBCR: 40.0000 meq | EXTENDED_RELEASE_TABLET | Freq: Once | ORAL | Status: DC

## 2023-06-26 MED ORDER — LACTATED RINGERS IV SOLN: INTRAVENOUS | Status: DC | PRN

## 2023-06-26 MED ORDER — PHENYLEPHRINE 80 MCG/ML (10ML) SYRINGE FOR IV PUSH (FOR BLOOD PRESSURE SUPPORT)
PREFILLED_SYRINGE | INTRAVENOUS | Status: DC | PRN
  Administered 2023-06-26 (×2): 80 ug via INTRAVENOUS

## 2023-06-26 MED ORDER — PROPOFOL 500 MG/50ML IV EMUL
INTRAVENOUS | Status: DC | PRN
  Administered 2023-06-26: 130 ug/kg/min via INTRAVENOUS

## 2023-06-26 MED ORDER — PHENYLEPHRINE HCL-NACL 20-0.9 MG/250ML-% IV SOLN
INTRAVENOUS | Status: DC | PRN
  Administered 2023-06-26: 20 ug/min via INTRAVENOUS

## 2023-06-26 MED ORDER — SPIRONOLACTONE 12.5 MG HALF TABLET
12.5000 mg | ORAL_TABLET | Freq: Every day | ORAL | Status: DC
  Administered 2023-06-26 – 2023-06-27 (×2): 12.5 mg via ORAL
  Filled 2023-06-26 (×2): qty 1

## 2023-06-26 MED ORDER — LIDOCAINE 2% (20 MG/ML) 5 ML SYRINGE
INTRAMUSCULAR | Status: DC | PRN
Start: 2023-06-26 — End: 2023-06-26
  Administered 2023-06-26: 100 mg via INTRAVENOUS

## 2023-06-26 MED ORDER — MAGNESIUM SULFATE 2 GM/50ML IV SOLN
2.0000 g | Freq: Once | INTRAVENOUS | Status: AC
  Administered 2023-06-26: 2 g via INTRAVENOUS
  Filled 2023-06-26 (×2): qty 50

## 2023-06-26 MED ORDER — TORSEMIDE 20 MG PO TABS
40.0000 mg | ORAL_TABLET | Freq: Every day | ORAL | Status: DC
  Administered 2023-06-26 – 2023-06-27 (×2): 40 mg via ORAL
  Filled 2023-06-26 (×2): qty 2

## 2023-06-26 MED ORDER — INSULIN ASPART 100 UNIT/ML IJ SOLN
0.0000 [IU] | Freq: Three times a day (TID) | INTRAMUSCULAR | Status: DC
  Administered 2023-06-26: 1 [IU] via SUBCUTANEOUS
  Administered 2023-06-26: 5 [IU] via SUBCUTANEOUS
  Administered 2023-06-27 (×2): 1 [IU] via SUBCUTANEOUS

## 2023-06-26 MED ORDER — POTASSIUM CHLORIDE CRYS ER 20 MEQ PO TBCR
40.0000 meq | EXTENDED_RELEASE_TABLET | Freq: Two times a day (BID) | ORAL | Status: AC
  Administered 2023-06-26 (×2): 40 meq via ORAL
  Filled 2023-06-26 (×2): qty 2

## 2023-06-26 SURGICAL SUPPLY — 1 items: PAD DEFIB RADIO PHYSIO CONN (PAD) ×1 IMPLANT

## 2023-06-26 NOTE — Procedures (Signed)
 Electrical Cardioversion Procedure Note Cleophas Gaydon [id number] May 09, 1955  Procedure: Electrical Cardioversion Indications:  Atrial Fibrillation  Procedure Details Consent: Risks of procedure as well as the alternatives and risks of each were explained to the (patient/caregiver).  Consent for procedure obtained. Time Out: Verified patient identification, verified procedure, site/side was marked, verified correct patient position, special equipment/implants available, medications/allergies/relevent history reviewed, required imaging and test results available.  Performed  Patient placed on cardiac monitor, pulse oximetry, supplemental oxygen  as necessary.  Sedation given:  Propofol  per anesthesiology Pacer pads placed anterior and posterior chest.  Cardioverted 1 time(s).  Cardioverted at 360J.  Evaluation Findings: Post procedure EKG shows: NSR Complications: None Patient did tolerate procedure well.   Peder Bourdon 06/26/2023, 9:24 AM

## 2023-06-26 NOTE — Progress Notes (Signed)
 Patient ID: Karla Foster, female   DOB: November 25, 1955, 68 y.o.   MRN: [id number]     Advanced Heart Failure Rounding Note  Cardiologist: Hazle Lites, MD  Chief Complaint: CHF Subjective:    Good diuresis overnight, I/Os net negative 1106.  Creatinine stable 0.8.  She underwent TEE-guided DCCV today back to NSR.      Still has some back pain. Denies dyspnea at rest.   LHC/RHC (5/6):  1. Normal coronaries, nonischemic cardiomyopathy.  2. Elevated PCWP though LVEDP was not significantly elevated (discrepancy may be related to mitral regurgitation).  3. Borderline elevated right-sided filling pressures.  4. Low CI at 1.9 5. Mild pulmonary venous hypertension.   TEE-guided DCCV (5/7):  Mildly dilated left ventricle. LV EF 35% with moderate diffuse hypokinesis when in AF with RVR, LV EF 50% after DCCV. Normal wall thickness. Mildly dilated RV with mildly decreased systolic function. Moderately dilated left atrium. There was smoke in the LA appendage but not discrete thrombus (multiple images obtained), mildly dilated RA. No PFO or ASD by color doppler. Moderate TR. The aortic valve was trileaflet and thickened at the tips with incomplete coaptation and moderate-severe aortic insufficiency, vena contracta 0.44 cm. The mitral valve was thickened towards the tips and rheumatic-appearing. There was incomplete coaptation. Severe mitral regurgitation with probably mild mitral stenosis (mean gradient 7 mmHg in setting of severe MR). After DCCV, MR remained severe.   Objective:   Weight Range: 72.9 kg Body mass index is 25.17 kg/m.   Vital Signs:   Temp:  [97.4 F (36.3 C)-98.7 F (37.1 C)] 98.2 F (36.8 C) (05/07 0925) Pulse Rate:  [66-161] 71 (05/07 0930) Resp:  [14-36] 19 (05/07 0930) BP: (80-138)/(56-110) 91/64 (05/07 0930) SpO2:  [85 %-100 %] 93 % (05/07 0930) Last BM Date : 06/25/23  Weight change: Filed Weights   06/24/23 1036 06/25/23 0600  Weight: 75.3 kg 72.9 kg     Intake/Output:   Intake/Output Summary (Last 24 hours) at 06/26/2023 0936 Last data filed at 06/26/2023 0917 Gross per 24 hour  Intake 2718.97 ml  Output 3201 ml  Net -482.03 ml      Physical Exam    General: NAD Neck: No JVD, no thyromegaly or thyroid  nodule.  Lungs: Clear to auscultation bilaterally with normal respiratory effort. CV: Nondisplaced PMI.  Heart tachy, irregular S1/S2, no S3/S4, 2/6 HSM apex.  No peripheral edema.   Abdomen: Soft, nontender, no hepatosplenomegaly, no distention.  Skin: Intact without lesions or rashes.  Neurologic: Alert and oriented x 3.  Psych: Normal affect. Extremities: No clubbing or cyanosis.  HEENT: Normal.   Telemetry   Atrial fibrillation 110s-130s => NSR 70s with DCCV (personally reviewed).   Labs    CBC Recent Labs    06/25/23 0515 06/25/23 0908 06/26/23 0510  WBC 12.1*  --  12.1*  HGB 12.0 12.9  13.3 11.8*  HCT 37.4 38.0  39.0 36.2  MCV 100.3*  --  98.9  PLT 213  --  193   Basic Metabolic Panel Recent Labs    40/98/11 1053 06/25/23 0515 06/25/23 0908 06/26/23 0510  NA 138 140 141  141 137  K 3.9 3.5 3.4*  3.3* 3.3*  CL 104 104  --  99  CO2 22 27  --  28  GLUCOSE 100* 109*  --  265*  BUN 10 7*  --  <5*  CREATININE 0.92 0.75  --  0.86  CALCIUM  9.1 8.4*  --  7.6*  MG 1.7  --   --   --  Liver Function Tests Recent Labs    06/24/23 1053  AST 21  ALT 12  ALKPHOS 65  BILITOT 0.5  PROT 6.3*  ALBUMIN  3.4*   No results for input(s): "LIPASE", "AMYLASE" in the last 72 hours. Cardiac Enzymes No results for input(s): "CKTOTAL", "CKMB", "CKMBINDEX", "TROPONINI" in the last 72 hours.  BNP: BNP (last 3 results) Recent Labs    06/24/23 1053  BNP 771.9*    ProBNP (last 3 results) No results for input(s): "PROBNP" in the last 8760 hours.   D-Dimer No results for input(s): "DDIMER" in the last 72 hours. Hemoglobin A1C Recent Labs    06/25/23 0515  HGBA1C 5.0   Fasting Lipid Panel No results  for input(s): "CHOL", "HDL", "LDLCALC", "TRIG", "CHOLHDL", "LDLDIRECT" in the last 72 hours. Thyroid  Function Tests Recent Labs    06/24/23 1053  TSH 2.807    Other results:   Imaging    EP STUDY Result Date: 06/26/2023 See surgical note for result.    Medications:     Scheduled Medications:  [MAR Hold] atorvastatin   40 mg Oral Daily   [MAR Hold] Chlorhexidine  Gluconate Cloth  6 each Topical Daily   [MAR Hold] digoxin  0.125 mg Oral Daily   [MAR Hold] gabapentin   300 mg Oral TID   [MAR Hold] pantoprazole   40 mg Oral Daily   potassium chloride   40 mEq Oral Once   [MAR Hold] sodium chloride  flush  10-40 mL Intracatheter Q12H   [MAR Hold] sodium chloride  flush  3 mL Intravenous Q12H   spironolactone  12.5 mg Oral Daily   torsemide  40 mg Oral Daily    Infusions:  sodium chloride  20 mL/hr at 06/26/23 0655   [MAR Hold] sodium chloride      amiodarone 60 mg/hr (06/26/23 0716)   heparin  1,250 Units/hr (06/26/23 0655)    PRN Medications: [MAR Hold] sodium chloride , [MAR Hold] acetaminophen , [MAR Hold] nitroGLYCERIN , [MAR Hold] ondansetron  (ZOFRAN ) IV, [MAR Hold] ondansetron  (ZOFRAN ) IV, [MAR Hold] mouth rinse, [MAR Hold] sodium chloride  flush, [MAR Hold] sodium chloride  flush, [MAR Hold] traMADol    Assessment/Plan   1. Atrial fibrillation: With RVR.  Unsure how long this has been present, she has been minimally symptomatic.  She does report some palpitations about a week ago.  She had TEE-guided DCCV today, is now back in NSR.  I worry that with severe MR, AF is at high risk for recurrence.  - Continue IV amiodarone today.  - Continue heparin  gtt.  - Would do Maze with valve surgery (see below).  2. Acute systolic CHF:  Remote echo showed normal EF.  Echo this admission showed EF 30-35%, moderate RV dysfunction, PASP 65, severe MR, moderate TR, moderate-severe AI, IVC dilated.   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  After DCCV today, echo images showed improvement  in EF from around 35% in AF/RVR to about 50% in NSR.  She has diuresed well with IV Lasix , IVC small on echo today. Co-ox 66% today.  - Transition to torsemide 40 mg daily.   - Continue digoxin for now.  - Add spironolactone 12.5 daily.  3. Valvular heart disease: TEE today showed probable rheumatic heart disease with moderate-severe AI, moderate TR, severe mitral regurgitation with probably mild mitral stenosis.   - Will consult cardiac surgery.  4. Chronic low back pain: History of spinal stenosis. Continue tramadol and continue home gabapentin .   CRITICAL CARE Performed by: Peder Bourdon  Total critical care time: 40 minutes  Critical care  time was exclusive of separately billable procedures and treating other patients.  Critical care was necessary to treat or prevent imminent or life-threatening deterioration.  Critical care was time spent personally by me on the following activities: development of treatment plan with patient and/or surrogate as well as nursing, discussions with consultants, evaluation of patient's response to treatment, examination of patient, obtaining history from patient or surrogate, ordering and performing treatments and interventions, ordering and review of laboratory studies, ordering and review of radiographic studies, pulse oximetry and re-evaluation of patient's condition.   Length of Stay: 2  Peder Bourdon, MD  06/26/2023, 9:36 AM  Advanced Heart Failure Team Pager [phone] (M-F; 7a - 5p)  Please contact [facility] for night-coverage after hours (5p -7a ) and weekends on [email or web address]
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
## 2023-06-26 NOTE — Progress Notes (Signed)
 PT Cancellation Note  Patient Details Name: Karla Foster MRN: [id number] DOB: 26-Jan-1956   Cancelled Treatment:    Reason Eval/Treat Not Completed: Patient at procedure or test/unavailable. Pt off unit for cardioversion. Acute PT to return as able, as appropriate to progress mobility.  Renaee Caro, PT, DPT Acute Rehabilitation Services Secure chat preferred Office #: [phone]    Jenna Moan 06/26/2023, 9:15 AM

## 2023-06-26 NOTE — Progress Notes (Signed)
  Echocardiogram Echocardiogram Transesophageal has been performed.  Karla Foster 06/26/2023, 9:47 AM

## 2023-06-26 NOTE — Progress Notes (Signed)
 Returned to [employer]. SR on monitor. VSS.
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
## 2023-06-26 NOTE — Interval H&P Note (Signed)
 History and Physical Interval Note:  06/26/2023 8:47 AM  Karla Foster  has presented today for surgery, with the diagnosis of afib.  The various methods of treatment have been discussed with the patient and family. After consideration of risks, benefits and other options for treatment, the patient has consented to  Procedure(s): TRANSESOPHAGEAL ECHOCARDIOGRAM ([name]) CARDIOVERSION ([name]) as a surgical intervention.  The patient's history has been reviewed, patient examined, no change in status, stable for surgery.  I have reviewed the patient's chart and labs.  Questions were answered to the patient's satisfaction.     Saanvika Vazques [employer]
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
## 2023-06-26 NOTE — Plan of Care (Signed)

## 2023-06-26 NOTE — Plan of Care (Signed)

## 2023-06-26 NOTE — Transfer of Care (Signed)
 Immediate Anesthesia Transfer of Care Note  Patient: Karla Foster  Procedure(s) Performed: TRANSESOPHAGEAL ECHOCARDIOGRAM CARDIOVERSION  Patient Location: Cath Lab holding  Anesthesia Type:MAC  Level of Consciousness: awake, alert , and oriented  Airway & Oxygen  Therapy: Patient Spontanous Breathing and Patient connected to nasal cannula oxygen   Post-op Assessment: Report given to RN and Post -op Vital signs reviewed and stable  Post vital signs: Reviewed and stable  Last Vitals:  Vitals Value Taken Time  BP 85/64 06/26/23 0924  Temp 97   Pulse 67 06/26/23 0925  Resp 21 06/26/23 0925  SpO2 96 % 06/26/23 0925  Vitals shown include unfiled device data.  Last Pain:  Vitals:   06/26/23 0801  TempSrc: Temporal  PainSc:       Patients Stated Pain Goal: 3 (06/24/23 1923)  Complications: There were no known notable events for this encounter.

## 2023-06-26 NOTE — Anesthesia Postprocedure Evaluation (Signed)
 Anesthesia Post Note  Patient: Karla Foster  Procedure(s) Performed: TRANSESOPHAGEAL ECHOCARDIOGRAM CARDIOVERSION     Patient location during evaluation: Cath Lab Anesthesia Type: MAC Level of consciousness: awake and alert Pain management: pain level controlled Vital Signs Assessment: post-procedure vital signs reviewed and stable Respiratory status: spontaneous breathing, nonlabored ventilation, respiratory function stable and patient connected to nasal cannula oxygen  Cardiovascular status: stable and blood pressure returned to baseline Postop Assessment: no apparent nausea or vomiting Anesthetic complications: no  There were no known notable events for this encounter.  Last Vitals:  Vitals:   06/26/23 0940 06/26/23 1000  BP: 95/70 94/71  Pulse: 67 63  Resp: (!) 21 16  Temp:    SpO2: 95% 98%    Last Pain:  Vitals:   06/26/23 0925  TempSrc: Temporal  PainSc:                  [name] [name]

## 2023-06-26 NOTE — Progress Notes (Signed)
Transported to cath lab 

## 2023-06-27 ENCOUNTER — Encounter (HOSPITAL_COMMUNITY): Payer: Self-pay | Admitting: Cardiology

## 2023-06-27 ENCOUNTER — Inpatient Hospital Stay (HOSPITAL_COMMUNITY)

## 2023-06-27 DIAGNOSIS — Z01810 Encounter for preprocedural cardiovascular examination: Secondary | ICD-10-CM | POA: Diagnosis not present

## 2023-06-27 DIAGNOSIS — I4891 Unspecified atrial fibrillation: Secondary | ICD-10-CM | POA: Diagnosis not present

## 2023-06-27 LAB — BASIC METABOLIC PANEL WITH GFR
Anion gap: 8 (ref 5–15)
BUN: 9 mg/dL (ref 8–23)
CO2: 32 mmol/L (ref 22–32)
Calcium: 8.3 mg/dL — ABNORMAL LOW (ref 8.9–10.3)
Chloride: 95 mmol/L — ABNORMAL LOW (ref 98–111)
Creatinine, Ser: 1.13 mg/dL — ABNORMAL HIGH (ref 0.44–1.00)
GFR, Estimated: 53 mL/min — ABNORMAL LOW (ref >60)
Glucose, Bld: 141 mg/dL — ABNORMAL HIGH (ref 70–99)
Potassium: 4.1 mmol/L (ref 3.5–5.1)
Sodium: 135 mmol/L (ref 135–145)

## 2023-06-27 LAB — CBC
HCT: 35 % — ABNORMAL LOW (ref 36.0–46.0)
Hemoglobin: 11.7 g/dL — ABNORMAL LOW (ref 12.0–15.0)
MCH: 33 pg (ref 26.0–34.0)
MCHC: 33.4 g/dL (ref 30.0–36.0)
MCV: 98.6 fL (ref 80.0–100.0)
Platelets: 184 10*3/uL (ref 150–400)
RBC: 3.55 MIL/uL — ABNORMAL LOW (ref 3.87–5.11)
RDW: 13.6 % (ref 11.5–15.5)
WBC: 14.9 10*3/uL — ABNORMAL HIGH (ref 4.0–10.5)
nRBC: 0 % (ref 0.0–0.2)

## 2023-06-27 LAB — COOXEMETRY PANEL
Carboxyhemoglobin: 1.5 % (ref 0.5–1.5)
Methemoglobin: <0.7 % (ref 0.0–1.5)
O2 Saturation: 64.5 %
Total hemoglobin: 12.2 g/dL (ref 12.0–16.0)

## 2023-06-27 LAB — GLUCOSE, CAPILLARY
Glucose-Capillary: 103 mg/dL — ABNORMAL HIGH (ref 70–99)
Glucose-Capillary: 116 mg/dL — ABNORMAL HIGH (ref 70–99)
Glucose-Capillary: 142 mg/dL — ABNORMAL HIGH (ref 70–99)
Glucose-Capillary: 121 mg/dL — ABNORMAL HIGH (ref 70–99)
Glucose-Capillary: 123 mg/dL — ABNORMAL HIGH (ref 70–99)

## 2023-06-27 LAB — HEPARIN LEVEL (UNFRACTIONATED): Heparin Unfractionated: 0.54 [IU]/mL (ref 0.30–0.70)

## 2023-06-27 LAB — MAGNESIUM: Magnesium: 1.7 mg/dL (ref 1.7–2.4)

## 2023-06-27 LAB — LIPOPROTEIN A (LPA): Lipoprotein (a): 42.3 nmol/L — ABNORMAL HIGH (ref <75.0)

## 2023-06-27 MED ORDER — HEPARIN 30,000 UNITS/1000 ML (OHS) CELLSAVER SOLUTION
Status: DC
  Filled 2023-06-27: qty 1000

## 2023-06-27 MED ORDER — TRANEXAMIC ACID (OHS) PUMP PRIME SOLUTION
2.0000 mg/kg | INTRAVENOUS | Status: DC
Start: 2023-06-28 — End: 2023-06-28
  Filled 2023-06-27: qty 1.51

## 2023-06-27 MED ORDER — AMIODARONE HCL 200 MG PO TABS
400.0000 mg | ORAL_TABLET | Freq: Two times a day (BID) | ORAL | Status: DC
  Administered 2023-06-27 – 2023-06-28 (×3): 400 mg via ORAL
  Filled 2023-06-27 (×4): qty 2

## 2023-06-27 MED ORDER — VANCOMYCIN HCL IN DEXTROSE 1-5 GM/200ML-% IV SOLN: 1000.0000 mg | INTRAVENOUS | Status: DC

## 2023-06-27 MED ORDER — MANNITOL 20 % IV SOLN
INTRAVENOUS | Status: DC
  Filled 2023-06-27: qty 13

## 2023-06-27 MED ORDER — DEXMEDETOMIDINE HCL IN NACL 400 MCG/100ML IV SOLN
0.1000 ug/kg/h | INTRAVENOUS | Status: AC
  Administered 2023-06-28: .7 ug/kg/h via INTRAVENOUS
  Administered 2023-06-28: .3 ug/kg/h via INTRAVENOUS
  Filled 2023-06-27: qty 100

## 2023-06-27 MED ORDER — MAGNESIUM SULFATE 4 GM/100ML IV SOLN
4.0000 g | Freq: Once | INTRAVENOUS | Status: AC
  Administered 2023-06-27: 4 g via INTRAVENOUS
  Filled 2023-06-27: qty 100

## 2023-06-27 MED ORDER — NOREPINEPHRINE 4 MG/250ML-% IV SOLN
0.0000 ug/min | INTRAVENOUS | Status: AC
  Administered 2023-06-28: 2 ug/min via INTRAVENOUS
  Filled 2023-06-27: qty 250

## 2023-06-27 MED ORDER — TRANEXAMIC ACID 1000 MG/10ML IV SOLN
1.5000 mg/kg/h | INTRAVENOUS | Status: AC
  Administered 2023-06-28: 1.5 mg/kg/h via INTRAVENOUS
  Filled 2023-06-27: qty 25

## 2023-06-27 MED ORDER — MILRINONE LACTATE IN DEXTROSE 20-5 MG/100ML-% IV SOLN
0.3000 ug/kg/min | INTRAVENOUS | Status: AC
  Administered 2023-06-28: .25 ug/kg/min via INTRAVENOUS
  Filled 2023-06-27: qty 100

## 2023-06-27 MED ORDER — CAMPHOR-MENTHOL 0.5-0.5 % EX LOTN
TOPICAL_LOTION | CUTANEOUS | Status: DC | PRN
  Filled 2023-06-27: qty 222

## 2023-06-27 MED ORDER — INSULIN REGULAR(HUMAN) IN NACL 100-0.9 UT/100ML-% IV SOLN
INTRAVENOUS | Status: AC
  Administered 2023-06-28: 1 [IU]/h via INTRAVENOUS
  Filled 2023-06-27: qty 100

## 2023-06-27 MED ORDER — MANNITOL 20 % IV SOLN
INTRAVENOUS | Status: DC
Start: 2023-06-28 — End: 2023-06-28
  Filled 2023-06-27: qty 13

## 2023-06-27 MED ORDER — CEFAZOLIN SODIUM-DEXTROSE 2-4 GM/100ML-% IV SOLN
2.0000 g | INTRAVENOUS | Status: AC
  Administered 2023-06-28: 2 g via INTRAVENOUS

## 2023-06-27 MED ORDER — TRANEXAMIC ACID (OHS) BOLUS VIA INFUSION
15.0000 mg/kg | INTRAVENOUS | Status: AC
  Administered 2023-06-28: 1129.5 mg via INTRAVENOUS
  Filled 2023-06-27: qty 1130

## 2023-06-27 MED ORDER — PHENYLEPHRINE HCL-NACL 20-0.9 MG/250ML-% IV SOLN
30.0000 ug/min | INTRAVENOUS | Status: AC
  Administered 2023-06-28: 30 ug/min via INTRAVENOUS
  Filled 2023-06-27: qty 250

## 2023-06-27 MED ORDER — VANCOMYCIN HCL 1250 MG/250ML IV SOLN
1250.0000 mg | INTRAVENOUS | Status: DC
  Filled 2023-06-27: qty 250

## 2023-06-27 MED ORDER — POTASSIUM CHLORIDE 2 MEQ/ML IV SOLN
80.0000 meq | INTRAVENOUS | Status: DC
  Filled 2023-06-27: qty 40

## 2023-06-27 MED ORDER — CEFAZOLIN SODIUM-DEXTROSE 2-4 GM/100ML-% IV SOLN
2.0000 g | INTRAVENOUS | Status: DC
  Filled 2023-06-27 (×2): qty 100

## 2023-06-27 MED ORDER — EPINEPHRINE HCL 5 MG/250ML IV SOLN IN NS
0.0000 ug/min | INTRAVENOUS | Status: AC
  Administered 2023-06-28: 4 ug/min via INTRAVENOUS
  Filled 2023-06-27: qty 250

## 2023-06-27 MED ORDER — VANCOMYCIN HCL 1.25 G IV SOLR
1250.0000 mg | INTRAVENOUS | Status: AC
  Administered 2023-06-28: 1250 mg via INTRAVENOUS
  Filled 2023-06-27: qty 25

## 2023-06-27 MED ORDER — PLASMA-LYTE A IV SOLN
INTRAVENOUS | Status: AC
  Filled 2023-06-27: qty 2.5

## 2023-06-27 MED ORDER — NITROGLYCERIN IN D5W 200-5 MCG/ML-% IV SOLN
2.0000 ug/min | INTRAVENOUS | Status: DC
Start: 2023-06-28 — End: 2023-06-28
  Filled 2023-06-27: qty 250

## 2023-06-27 MED ORDER — SODIUM CHLORIDE 0.9 % IV SOLN: INTRAVENOUS | Status: DC

## 2023-06-27 NOTE — Progress Notes (Signed)
 Pt c/o itching to central upper chest area. Area slightly reddened, unclear if true rash or if reddened by her scratching. Cards fellow notified, given order for PRN Sarna cream. Per chart review, pt does not appear to have received any medications that make sense for the timing/location of the itching.  Will continue to monitor.

## 2023-06-27 NOTE — Plan of Care (Signed)

## 2023-06-27 NOTE — Progress Notes (Signed)
 Physical Therapy Treatment Patient Details Name: Karla Foster MRN: [id number] DOB: 1956-02-17 Today's Date: 06/27/2023   History of Present Illness Pt is a 68yo female with a-fib RVR, tachycardia, and hypotensive. 5/6 R heart cath. 5/7 TEE and cardioversion. PMH: chronic back pain, lumbar compression fx and radiculopathy    PT Comments  Pt in bed upon arrival and agreeable to PT session. Pt progressed in today's session by needing less assistance for mobility and in increasing gait distance. Pt required CGA/supervision for bed mobility and was able to ambulate 100 ft with rollator before needing a seated rest break. After sitting for a few minutes, pt was then able to ambulate another 126ft with CGA and rollator back to the room. Pt is progressing well towards goals. Acute PT to follow.   SpO2 >92% on 3L BP 101/58, 63 BPM    If plan is discharge home, recommend the following: A little help with walking and/or transfers;A little help with bathing/dressing/bathroom;Assist for transportation   Can travel by private vehicle      Yes  Equipment Recommendations  None recommended by PT       Precautions / Restrictions Precautions Precautions: Fall Precaution/Restrictions Comments: 5/6 R heart cath     Mobility  Bed Mobility Overal bed mobility: Needs Assistance Bed Mobility: Rolling, Sidelying to Sit Rolling: Supervision Sidelying to sit: Contact guard assist     General bed mobility comments: CGA/supervision for safety. Able to use L UE to push to adhere to precautions    Transfers Overall transfer level: Needs assistance Equipment used: Rollator (4 wheels) Transfers: Sit to/from Stand Sit to Stand: Contact guard assist    General transfer comment: CGA for safety    Ambulation/Gait Ambulation/Gait assistance: Contact guard assist Gait Distance (Feet): 100 Feet (x100, x100) Assistive device: Rollator (4 wheels) Gait Pattern/deviations: Step-through pattern,  Decreased stride length Gait velocity: decr     General Gait Details: slow and steady gait with x1 seated rest break on rollator due to fatigue    Balance Overall balance assessment: Needs assistance Sitting-balance support: Feet supported, No upper extremity supported Sitting balance-Leahy Scale: Good     Standing balance support: Single extremity supported, During functional activity Standing balance-Leahy Scale: Fair Standing balance comment: able to stand with no AD, rollator for gait       Communication Communication Communication: No apparent difficulties  Cognition Arousal: Alert Behavior During Therapy: WFL for tasks assessed/performed   PT - Cognitive impairments: No apparent impairments    Following commands: Intact      Cueing Cueing Techniques: Verbal cues  Exercises General Exercises - Lower Extremity Hip Flexion/Marching: AROM, Both, 10 reps, Standing        Pertinent Vitals/Pain Pain Assessment Pain Assessment: No/denies pain     PT Goals (current goals can now be found in the care plan section) Acute Rehab PT Goals Patient Stated Goal: home PT Goal Formulation: With patient Time For Goal Achievement: 07/09/23 Potential to Achieve Goals: Good Progress towards PT goals: Progressing toward goals    Frequency    Min 2X/week       AM-PAC PT "6 Clicks" Mobility   Outcome Measure  Help needed turning from your back to your side while in a flat bed without using bedrails?: A Little Help needed moving from lying on your back to sitting on the side of a flat bed without using bedrails?: A Little Help needed moving to and from a bed to a chair (including a wheelchair)?: A Little Help  needed standing up from a chair using your arms (e.g., wheelchair or bedside chair)?: A Little Help needed to walk in hospital room?: A Little Help needed climbing 3-5 steps with a railing? : A Lot 6 Click Score: 17    End of Session Equipment Utilized During  Treatment: Oxygen  Activity Tolerance: Patient tolerated treatment well Patient left: in chair;with call bell/phone within reach;with nursing/sitter in room Nurse Communication: Mobility status PT Visit Diagnosis: Unsteadiness on feet (R26.81);Muscle weakness (generalized) (M62.81)     Time: 1610-9604 PT Time Calculation (min) (ACUTE ONLY): 25 min  Charges:    $Gait Training: 8-22 mins $Therapeutic Exercise: 8-22 mins PT General Charges $$ ACUTE PT VISIT: 1 Visit                     [name], PT, DPT Secure Chat Preferred  Rehab Office [phone]   [name] 06/27/2023, 11:07 AM
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
## 2023-06-27 NOTE — TOC Progression Note (Signed)
 Transition of Care [facility]) - Progression Note    Patient Details  Name: Karla Foster MRN: [id number] Date of Birth: 01-24-1956  Transition of [facility]) CM/SW Contact  Ernst Heap Phone Number: [phone] 06/27/2023, 2:06 PM  Clinical Narrative:   [facility] CSW/NCM will continue to monitor and follow patient until she is closer to being medically ready for dc. Plan is for patient to return back to ALF.   TOC will continue following.      Expected Discharge Plan: Assisted Living Barriers to Discharge: Continued Medical Work up  Expected Discharge Plan and Services In-house Referral: Clinical Social Work Discharge Planning Services: CM Consult Post Acute Care Choice: Home Health Living arrangements for the past 2 months: Assisted Living Facility                                       Social Determinants of Health (SDOH) Interventions SDOH Screenings   Food Insecurity: No Food Insecurity (06/26/2023)  Housing: Low Risk  (06/26/2023)  Transportation Needs: No Transportation Needs (06/26/2023)  Utilities: Not At Risk (06/26/2023)  Depression (PHQ2-9): Medium Risk (10/03/2022)  Financial Resource Strain: Low Risk  (12/25/2022)  Physical Activity: Insufficiently Active (05/15/2022)  Social Connections: Moderately Integrated (06/26/2023)  Stress: Stress Concern Present (05/15/2022)  Tobacco Use: Medium Risk (06/24/2023)    Readmission Risk Interventions     No data to display

## 2023-06-27 NOTE — Progress Notes (Addendum)
 Patient ID: Karla Foster, female   DOB: 03-Aug-1955, 68 y.o.   MRN: [id number]     Advanced Heart Failure Rounding Note  Cardiologist: Hazle Lites, MD  Chief Complaint: CHF Subjective:   LHC/RHC (5/6):  1. Normal coronaries, nonischemic cardiomyopathy.  2. Elevated PCWP though LVEDP was not significantly elevated (discrepancy may be related to mitral regurgitation).  3. Borderline elevated right-sided filling pressures.  4. Low CI at 1.9 5. Mild pulmonary venous hypertension.   5/7 TEE/DC-CV --> SR  CO-OX 64.5%.    Denies SOB.  Objective:   Weight Range: 75.3 kg Body mass index is 26 kg/m.   Vital Signs:   Temp:  [98.2 F (36.8 C)-99.9 F (37.7 C)] 98.9 F (37.2 C) (05/08 0325) Pulse Rate:  [25-76] 62 (05/08 0700) Resp:  [14-30] 20 (05/08 0700) BP: (81-129)/(50-86) 90/52 (05/08 0700) SpO2:  [90 %-100 %] 97 % (05/08 0700) Weight:  [75.3 kg] 75.3 kg (05/08 0630) Last BM Date : 06/25/23  Weight change: Filed Weights   06/24/23 1036 06/25/23 0600 06/27/23 0630  Weight: 75.3 kg 72.9 kg 75.3 kg    Intake/Output:   Intake/Output Summary (Last 24 hours) at 06/27/2023 0806 Last data filed at 06/27/2023 0700 Gross per 24 hour  Intake 2397.44 ml  Output 2250 ml  Net 147.44 ml    CVP 9   Physical Exam    General:   No resp difficulty Neck: supple. JVP 8-9 Cor: PMI nondisplaced. Regular rate & rhythm. No  rubs, gallops . 2/6 HSM. Lungs: clear Abdomen: soft, nontender, nondistended.  Extremities: no cyanosis, clubbing, rash, edema Neuro: alert & oriented x3   Telemetry   SR 60s   Labs    CBC Recent Labs    06/26/23 0510 06/27/23 0501  WBC 12.1* 14.9*  HGB 11.8* 11.7*  HCT 36.2 35.0*  MCV 98.9 98.6  PLT 193 184   Basic Metabolic Panel Recent Labs    40/98/11 0510 06/26/23 1612 06/27/23 0501  NA 137  --  135  K 3.3* 3.8 4.1  CL 99  --  95*  CO2 28  --  32  GLUCOSE 265*  --  141*  BUN <5*  --  9  CREATININE 0.86  --  1.13*   CALCIUM  7.6*  --  8.3*  MG 1.7  --  1.7   Liver Function Tests Recent Labs    06/24/23 1053  AST 21  ALT 12  ALKPHOS 65  BILITOT 0.5  PROT 6.3*  ALBUMIN  3.4*   No results for input(s): "LIPASE", "AMYLASE" in the last 72 hours. Cardiac Enzymes No results for input(s): "CKTOTAL", "CKMB", "CKMBINDEX", "TROPONINI" in the last 72 hours.  BNP: BNP (last 3 results) Recent Labs    06/24/23 1053  BNP 771.9*    ProBNP (last 3 results) No results for input(s): "PROBNP" in the last 8760 hours.   D-Dimer No results for input(s): "DDIMER" in the last 72 hours. Hemoglobin A1C Recent Labs    06/25/23 0515  HGBA1C 5.0   Fasting Lipid Panel No results for input(s): "CHOL", "HDL", "LDLCALC", "TRIG", "CHOLHDL", "LDLDIRECT" in the last 72 hours. Thyroid  Function Tests Recent Labs    06/24/23 1053  TSH 2.807    Other results:   Imaging    ECHO TEE Result Date: 06/26/2023    TRANSESOPHOGEAL ECHO REPORT   Patient Name:   Karla Foster Date of Exam: 06/26/2023 Medical Rec #:  [id number]  Height:       67.0 in Accession #:    [id number]              Weight:       160.7 lb Date of Birth:  [birth year]               BSA:          1.843 m Patient Age:    68 years                BP:           88/73 mmHg Patient Gender: F                       HR:           109 bpm. Exam Location:  Inpatient Procedure: 3D Echo, Transesophageal Echo, Cardiac Doppler and Color Doppler            (Both Spectral and Color Flow Doppler were utilized during            procedure). Indications:     I48.91* Unspeicified atrial fibrillation; I34.0 Nonrheumatic                  mitral (valve) insufficiency; I34.2 Nonrheumatic mitral (valve)                  stenosis  History:         Patient has prior history of Echocardiogram examinations, most                  recent 06/24/2023. Abnormal ECG, COPD, Mitral Valve Disease and                  Aortic Valve Disease, Arrythmias:Atrial Fibrillation,                   Signs/Symptoms:Dizziness/Lightheadedness; Risk                  Factors:Dyslipidemia and Current Smoker.  Sonographer:     Raynelle Callow RDCS Referring Phys:  [id number] SHENG L HALEY Diagnosing Phys: Alen Husbands McleanMD PROCEDURE: After discussion of the risks and benefits of a TEE, an informed consent was obtained from the patient. The transesophogeal probe was passed without difficulty through the esophogus of the patient. Imaged were obtained with the patient in a left lateral decubitus position. Sedation performed by different physician. The patient was monitored while under deep sedation. Anesthestetic sedation was provided intravenously by Anesthesiology: 240mg  of Propofol , 100mg  of Lidocaine . The patient's vital signs; including heart rate, blood pressure, and oxygen  saturation; remained stable throughout the procedure. Supplementary images were obtained from transthoracic windows as indicated to answer the clinical question. The patient developed no complications during the procedure. A successful direct current cardioversion was performed at 200 joules with 1 attempt.  IMPRESSIONS  1. Left ventricular ejection fraction, by estimation, is 35% when in atrial fibrillation with RVR. After DCCV, the LV EF improved and appeared to be around 50%. The left ventricle has moderately decreased function. The left ventricle demonstrates global  hypokinesis. The left ventricular internal cavity size was mildly dilated.  2. Right ventricular systolic function is mildly reduced. The right ventricular size is mildly enlarged. There is moderately elevated pulmonary artery systolic pressure. The estimated right ventricular systolic pressure is 45.2 mmHg.  3. There was smoke but no definite formed thrombus in the LA appendage. Left atrial size was moderately dilated.  4. Right atrial size  was mildly dilated.  5. No PFO/ASD by color doppler.  6. The mitral valve was thickened towards the tips and rheumatic-appearing. There was  incomplete coaptation. Severe mitral regurgitation with probably mild mitral stenosis (mean gradient 7 mmHg in setting of severe MR). PISA ERO 0.77 cm^2, vena contracta area 0.6 cm^2. There was systolic flow reversal in the pulmonary vein doppler signal.  7. The tricuspid valve is abnormal. Tricuspid valve regurgitation is moderate.  8. The aortic valve leaflet tips were thickened with incomplete coaptation. The aortic valve is tricuspid. Aortic valve regurgitation is moderate to severe. No aortic stenosis is present.  9. 3D performed of the mitral valve and 3D performed of the LAA. Aortic regurgitation vena contracta 0.44 cm. There was no holodiastolic flow reversal on doppler interrogation in the descending thoracic aorta. 10. The inferior vena cava is normal in size with <50% respiratory variability, suggesting right atrial pressure of 8 mmHg. 11. A small pericardial effusion is present. FINDINGS  Left Ventricle: LV function improved post cardioversion. Left ventricular ejection fraction, by estimation, is 35%. The left ventricle has moderately decreased function. The left ventricle demonstrates global hypokinesis. The left ventricular internal cavity size was mildly dilated. There is no left ventricular hypertrophy. Right Ventricle: The right ventricular size is mildly enlarged. No increase in right ventricular wall thickness. Right ventricular systolic function is mildly reduced. There is moderately elevated pulmonary artery systolic pressure. The tricuspid regurgitant velocity is 3.05 m/s, and with an assumed right atrial pressure of 8 mmHg, the estimated right ventricular systolic pressure is 45.2 mmHg. Left Atrium: There was smoke but no definite formed thrombus in the LA appendage. Left atrial size was moderately dilated. Right Atrium: Right atrial size was mildly dilated. Pericardium: A small pericardial effusion is present. Mitral Valve: The mitral valve was thickened towards the tips and  rheumatic-appearing. There was incomplete coaptation. Severe mitral regurgitation with probably mild mitral stenosis (mean gradient 7 mmHg in setting of severe MR). PISA ERO 0.77 cm^2, vena contracta area 0.6 cm^2. There was systolic flow reversal in the pulmonary vein doppler signal. The mitral valve is rheumatic. Severe mitral valve regurgitation. Mild to moderate mitral valve stenosis. MV peak gradient, 13.4 mmHg. The mean mitral valve gradient is 7.0 mmHg. Tricuspid Valve: The tricuspid valve is abnormal. Tricuspid valve regurgitation is moderate. Aortic Valve: The aortic valve leaflet tips were thickened with incomplete coaptation. The aortic valve is tricuspid. Aortic valve regurgitation is moderate to severe. No aortic stenosis is present. Pulmonic Valve: The pulmonic valve was normal in structure. Pulmonic valve regurgitation is trivial. No evidence of pulmonic stenosis. Aorta: The aortic root and ascending aorta are structurally normal, with no evidence of dilitation. Venous: The inferior vena cava is normal in size with less than 50% respiratory variability, suggesting right atrial pressure of 8 mmHg. IAS/Shunts: No PFO/ASD by color doppler. Additional Comments: Spectral Doppler performed. LEFT VENTRICLE PLAX 2D LVIDd:         5.80 cm LV PW:         1.00 cm LV IVS:        0.90 cm  IVC IVC diam: 1.80 cm MITRAL VALVE             TRICUSPID VALVE MV Peak grad: 13.4 mmHg  TR Peak grad:   37.2 mmHg MV Mean grad: 7.0 mmHg   TR Vmax:        305.00 cm/s MV Vmax:      1.83 m/s MV Vmean:     124.0  cm/s Maika Kaczmarek McleanMD Electronically signed by Archer Bear Signature Date/Time: 06/26/2023/3:53:22 PM    Final    EP STUDY Result Date: 06/26/2023 See surgical note for result.    Medications:     Scheduled Medications:  atorvastatin   40 mg Oral Daily   Chlorhexidine  Gluconate Cloth  6 each Topical Daily   digoxin  0.125 mg Oral Daily   gabapentin   300 mg Oral TID   insulin aspart  0-5 Units Subcutaneous QHS    insulin aspart  0-9 Units Subcutaneous TID WC   pantoprazole   40 mg Oral Daily   sodium chloride  flush  10-40 mL Intracatheter Q12H   sodium chloride  flush  3 mL Intravenous Q12H   spironolactone  12.5 mg Oral Daily   torsemide  40 mg Oral Daily    Infusions:  sodium chloride      amiodarone 60 mg/hr (06/27/23 0700)   heparin  1,250 Units/hr (06/27/23 0700)    PRN Medications: acetaminophen , camphor-menthol , nitroGLYCERIN , ondansetron  (ZOFRAN ) IV, ondansetron  (ZOFRAN ) IV, mouth rinse, sodium chloride  flush, sodium chloride  flush, traMADol    Assessment/Plan   1. Atrial fibrillation: With RVR.  Unsure how long this has been present, she has been minimally symptomatic.  She does report some palpitations about a week ago.   5/7 TEE-guided DCCV today, is now back in NSR.  Concern that with severe MR, AF is at high risk for recurrence.  - Stop IV amio. Start amio 400 mg twice a day.  - Continue heparin  gtt until all procedures completed.  - Would do Maze with valve surgery (see below).  2. Acute systolic CHF:  Remote echo showed normal EF.  Echo this admission showed EF 30-35%, moderate RV dysfunction, PASP 65, severe MR, moderate TR, moderate-severe AI, IVC dilated.   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  After DCCV today, echo images showed improvement in EF from around 35% in AF/RVR to about 50% in NSR. CVP 8-9  -- Continue torsemide 40 mg daily.   - Continue digoxin for now.  - Continue  spironolactone 12.5 daily. Increase tomorrow.  - Renal function ok.  3. Valvular heart disease: TEE today showed probable rheumatic heart disease with moderate-severe AI, moderate TR, severe mitral regurgitation with probably mild mitral stenosis.   - CT surgery consulted.  4. Chronic low back pain: History of spinal stenosis. Continue tramadol and continue home gabapentin .   Needs PT today. Can transfer out of ICU.   Length of Stay: 3  Amy Clegg, NP  06/27/2023, 8:06 AM  Advanced  Heart Failure Team Pager [phone] (M-F; 7a - 5p)  Please contact [facility] for night-coverage after hours (5p -7a ) and weekends on [email or web address]   Patient seen with NP, agree with the above note.   She remains in NSR on amiodarone gtt today.  No complaints.    CVP 7 on my read, co-ox 65%.    General: NAD Neck: No JVD, no thyromegaly or thyroid  nodule.  Lungs: Clear to auscultation bilaterally with normal respiratory effort. CV: Nondisplaced PMI.  Heart regular S1/S2, no S3/S4, 2/6 HSM LLSB/apex. Trace ankle edema.  Abdomen: Soft, nontender, no hepatosplenomegaly, no distention.  Skin: Intact without lesions or rashes.  Neurologic: Alert and oriented x 3.  Psych: Normal affect. Extremities: No clubbing or cyanosis.  HEENT: Normal.   She remains in NSR, can transition from IV to po amiodarone.  Continue heparin  gtt while we are awaiting surgical evaluation. I do not think that she is likely to maintain  NSR long-term with her severe valvular disease unless corrected.  If she has cardiac surgery, would accompany with Maze.   I suspect rheumatic valvular disease, has moderate-severe AI, severe MR/mild MS, moderate TR.  EF looked improved to around 50% post-DCCV.  Need surgical consideration for valves, do not think she would be candidate for tMVR. Dr. Sherene Dilling to see.   CVP and co-ox stable, can continue current spironolactone, digoxin, and torsemide.   OK for transfer to progressive unit.   Peder Bourdon 06/27/2023 8:35 AM

## 2023-06-27 NOTE — Progress Notes (Signed)
 CARDIAC REHAB PHASE I   OR plan pending. [facility] handout, booklet and IS left in room for pt. Will return for full education once plan finalized.   Ronny Colas, RN BSN 06/27/2023 1:56 PM

## 2023-06-27 NOTE — Progress Notes (Signed)
 VASCULAR LAB    Carotid duplex has been performed.  See CV proc for preliminary results.   Broadus Costilla, RVT 06/27/2023, 2:20 PM

## 2023-06-27 NOTE — Plan of Care (Signed)

## 2023-06-28 ENCOUNTER — Inpatient Hospital Stay (HOSPITAL_COMMUNITY): Payer: Self-pay | Admitting: Anesthesiology

## 2023-06-28 ENCOUNTER — Inpatient Hospital Stay (HOSPITAL_COMMUNITY)

## 2023-06-28 ENCOUNTER — Other Ambulatory Visit: Payer: Self-pay

## 2023-06-28 ENCOUNTER — Encounter (HOSPITAL_COMMUNITY): Payer: Self-pay | Admitting: Cardiovascular Disease

## 2023-06-28 ENCOUNTER — Encounter (HOSPITAL_COMMUNITY): Admission: EM | Disposition: E | Payer: Self-pay | Source: Home / Self Care | Attending: Cardiology

## 2023-06-28 DIAGNOSIS — I4891 Unspecified atrial fibrillation: Secondary | ICD-10-CM

## 2023-06-28 DIAGNOSIS — Z953 Presence of xenogenic heart valve: Secondary | ICD-10-CM

## 2023-06-28 DIAGNOSIS — I059 Rheumatic mitral valve disease, unspecified: Secondary | ICD-10-CM | POA: Diagnosis not present

## 2023-06-28 DIAGNOSIS — Z87891 Personal history of nicotine dependence: Secondary | ICD-10-CM

## 2023-06-28 DIAGNOSIS — I069 Rheumatic aortic valve disease, unspecified: Secondary | ICD-10-CM

## 2023-06-28 DIAGNOSIS — I080 Rheumatic disorders of both mitral and aortic valves: Secondary | ICD-10-CM

## 2023-06-28 DIAGNOSIS — J449 Chronic obstructive pulmonary disease, unspecified: Secondary | ICD-10-CM

## 2023-06-28 HISTORY — PX: AORTIC VALVE REPLACEMENT: SHX41

## 2023-06-28 HISTORY — PX: CLIPPING OF ATRIAL APPENDAGE: SHX5773

## 2023-06-28 HISTORY — PX: MAZE: SHX5063

## 2023-06-28 HISTORY — PX: INTRAOPERATIVE TRANSESOPHAGEAL ECHOCARDIOGRAM: SHX5062

## 2023-06-28 HISTORY — PX: MITRAL VALVE REPLACEMENT: SHX147

## 2023-06-28 LAB — POCT I-STAT, CHEM 8
BUN: 16 mg/dL (ref 8–23)
BUN: 14 mg/dL (ref 8–23)
BUN: 16 mg/dL (ref 8–23)
BUN: 15 mg/dL (ref 8–23)
BUN: 15 mg/dL (ref 8–23)
BUN: 15 mg/dL (ref 8–23)
BUN: 15 mg/dL (ref 8–23)
BUN: 13 mg/dL (ref 8–23)
BUN: 13 mg/dL (ref 8–23)
BUN: 14 mg/dL (ref 8–23)
BUN: 12 mg/dL (ref 8–23)
BUN: 12 mg/dL (ref 8–23)
Calcium, Ion: 1.04 mmol/L — ABNORMAL LOW (ref 1.15–1.40)
Calcium, Ion: 0.94 mmol/L — ABNORMAL LOW (ref 1.15–1.40)
Calcium, Ion: 1.06 mmol/L — ABNORMAL LOW (ref 1.15–1.40)
Calcium, Ion: 0.95 mmol/L — ABNORMAL LOW (ref 1.15–1.40)
Calcium, Ion: 0.95 mmol/L — ABNORMAL LOW (ref 1.15–1.40)
Calcium, Ion: 0.97 mmol/L — ABNORMAL LOW (ref 1.15–1.40)
Calcium, Ion: 0.92 mmol/L — ABNORMAL LOW (ref 1.15–1.40)
Calcium, Ion: 0.99 mmol/L — ABNORMAL LOW (ref 1.15–1.40)
Calcium, Ion: 1.01 mmol/L — ABNORMAL LOW (ref 1.15–1.40)
Calcium, Ion: 1.04 mmol/L — ABNORMAL LOW (ref 1.15–1.40)
Calcium, Ion: 1.12 mmol/L — ABNORMAL LOW (ref 1.15–1.40)
Calcium, Ion: 1.08 mmol/L — ABNORMAL LOW (ref 1.15–1.40)
Chloride: 92 mmol/L — ABNORMAL LOW (ref 98–111)
Chloride: 90 mmol/L — ABNORMAL LOW (ref 98–111)
Chloride: 91 mmol/L — ABNORMAL LOW (ref 98–111)
Chloride: 90 mmol/L — ABNORMAL LOW (ref 98–111)
Chloride: 90 mmol/L — ABNORMAL LOW (ref 98–111)
Chloride: 92 mmol/L — ABNORMAL LOW (ref 98–111)
Chloride: 94 mmol/L — ABNORMAL LOW (ref 98–111)
Chloride: 101 mmol/L (ref 98–111)
Chloride: 99 mmol/L (ref 98–111)
Chloride: 99 mmol/L (ref 98–111)
Chloride: 101 mmol/L (ref 98–111)
Chloride: 103 mmol/L (ref 98–111)
Creatinine, Ser: 0.9 mg/dL (ref 0.44–1.00)
Creatinine, Ser: 0.8 mg/dL (ref 0.44–1.00)
Creatinine, Ser: 0.9 mg/dL (ref 0.44–1.00)
Creatinine, Ser: 0.8 mg/dL (ref 0.44–1.00)
Creatinine, Ser: 0.8 mg/dL (ref 0.44–1.00)
Creatinine, Ser: 0.8 mg/dL (ref 0.44–1.00)
Creatinine, Ser: 0.8 mg/dL (ref 0.44–1.00)
Creatinine, Ser: 0.7 mg/dL (ref 0.44–1.00)
Creatinine, Ser: 0.7 mg/dL (ref 0.44–1.00)
Creatinine, Ser: 0.8 mg/dL (ref 0.44–1.00)
Creatinine, Ser: 0.8 mg/dL (ref 0.44–1.00)
Creatinine, Ser: 0.8 mg/dL (ref 0.44–1.00)
Glucose, Bld: 130 mg/dL — ABNORMAL HIGH (ref 70–99)
Glucose, Bld: 126 mg/dL — ABNORMAL HIGH (ref 70–99)
Glucose, Bld: 148 mg/dL — ABNORMAL HIGH (ref 70–99)
Glucose, Bld: 142 mg/dL — ABNORMAL HIGH (ref 70–99)
Glucose, Bld: 137 mg/dL — ABNORMAL HIGH (ref 70–99)
Glucose, Bld: 142 mg/dL — ABNORMAL HIGH (ref 70–99)
Glucose, Bld: 131 mg/dL — ABNORMAL HIGH (ref 70–99)
Glucose, Bld: 195 mg/dL — ABNORMAL HIGH (ref 70–99)
Glucose, Bld: 211 mg/dL — ABNORMAL HIGH (ref 70–99)
Glucose, Bld: 231 mg/dL — ABNORMAL HIGH (ref 70–99)
Glucose, Bld: 175 mg/dL — ABNORMAL HIGH (ref 70–99)
Glucose, Bld: 134 mg/dL — ABNORMAL HIGH (ref 70–99)
HCT: 36 % (ref 36.0–46.0)
HCT: 26 % — ABNORMAL LOW (ref 36.0–46.0)
HCT: 34 % — ABNORMAL LOW (ref 36.0–46.0)
HCT: 26 % — ABNORMAL LOW (ref 36.0–46.0)
HCT: 23 % — ABNORMAL LOW (ref 36.0–46.0)
HCT: 25 % — ABNORMAL LOW (ref 36.0–46.0)
HCT: 26 % — ABNORMAL LOW (ref 36.0–46.0)
HCT: 19 % — ABNORMAL LOW (ref 36.0–46.0)
HCT: 20 % — ABNORMAL LOW (ref 36.0–46.0)
HCT: 22 % — ABNORMAL LOW (ref 36.0–46.0)
HCT: 29 % — ABNORMAL LOW (ref 36.0–46.0)
HCT: 30 % — ABNORMAL LOW (ref 36.0–46.0)
Hemoglobin: 12.2 g/dL (ref 12.0–15.0)
Hemoglobin: 11.6 g/dL — ABNORMAL LOW (ref 12.0–15.0)
Hemoglobin: 8.8 g/dL — ABNORMAL LOW (ref 12.0–15.0)
Hemoglobin: 8.8 g/dL — ABNORMAL LOW (ref 12.0–15.0)
Hemoglobin: 7.8 g/dL — ABNORMAL LOW (ref 12.0–15.0)
Hemoglobin: 8.5 g/dL — ABNORMAL LOW (ref 12.0–15.0)
Hemoglobin: 8.8 g/dL — ABNORMAL LOW (ref 12.0–15.0)
Hemoglobin: 6.5 g/dL — CL (ref 12.0–15.0)
Hemoglobin: 6.8 g/dL — CL (ref 12.0–15.0)
Hemoglobin: 7.5 g/dL — ABNORMAL LOW (ref 12.0–15.0)
Hemoglobin: 9.9 g/dL — ABNORMAL LOW (ref 12.0–15.0)
Hemoglobin: 10.2 g/dL — ABNORMAL LOW (ref 12.0–15.0)
Potassium: 3.3 mmol/L — ABNORMAL LOW (ref 3.5–5.1)
Potassium: 3.1 mmol/L — ABNORMAL LOW (ref 3.5–5.1)
Potassium: 3.6 mmol/L (ref 3.5–5.1)
Potassium: 4.4 mmol/L (ref 3.5–5.1)
Potassium: 3.9 mmol/L (ref 3.5–5.1)
Potassium: 4 mmol/L (ref 3.5–5.1)
Potassium: 4.3 mmol/L (ref 3.5–5.1)
Potassium: 3.9 mmol/L (ref 3.5–5.1)
Potassium: 4 mmol/L (ref 3.5–5.1)
Potassium: 4.1 mmol/L (ref 3.5–5.1)
Potassium: 3.4 mmol/L — ABNORMAL LOW (ref 3.5–5.1)
Potassium: 3.4 mmol/L — ABNORMAL LOW (ref 3.5–5.1)
Sodium: 135 mmol/L (ref 135–145)
Sodium: 133 mmol/L — ABNORMAL LOW (ref 135–145)
Sodium: 133 mmol/L — ABNORMAL LOW (ref 135–145)
Sodium: 131 mmol/L — ABNORMAL LOW (ref 135–145)
Sodium: 132 mmol/L — ABNORMAL LOW (ref 135–145)
Sodium: 134 mmol/L — ABNORMAL LOW (ref 135–145)
Sodium: 134 mmol/L — ABNORMAL LOW (ref 135–145)
Sodium: 136 mmol/L (ref 135–145)
Sodium: 137 mmol/L (ref 135–145)
Sodium: 137 mmol/L (ref 135–145)
Sodium: 138 mmol/L (ref 135–145)
Sodium: 139 mmol/L (ref 135–145)
TCO2: 33 mmol/L — ABNORMAL HIGH (ref 22–32)
TCO2: 31 mmol/L (ref 22–32)
TCO2: 32 mmol/L (ref 22–32)
TCO2: 33 mmol/L — ABNORMAL HIGH (ref 22–32)
TCO2: 33 mmol/L — ABNORMAL HIGH (ref 22–32)
TCO2: 33 mmol/L — ABNORMAL HIGH (ref 22–32)
TCO2: 32 mmol/L (ref 22–32)
TCO2: 25 mmol/L (ref 22–32)
TCO2: 26 mmol/L (ref 22–32)
TCO2: 27 mmol/L (ref 22–32)
TCO2: 24 mmol/L (ref 22–32)
TCO2: 23 mmol/L (ref 22–32)

## 2023-06-28 LAB — POCT I-STAT 7, (LYTES, BLD GAS, ICA,H+H)
Acid-Base Excess: 7 mmol/L — ABNORMAL HIGH (ref 0.0–2.0)
Acid-Base Excess: 8 mmol/L — ABNORMAL HIGH (ref 0.0–2.0)
Acid-Base Excess: 0 mmol/L (ref 0.0–2.0)
Acid-base deficit: 2 mmol/L (ref 0.0–2.0)
Acid-base deficit: 2 mmol/L (ref 0.0–2.0)
Acid-base deficit: 2 mmol/L (ref 0.0–2.0)
Acid-base deficit: 3 mmol/L — ABNORMAL HIGH (ref 0.0–2.0)
Acid-base deficit: 3 mmol/L — ABNORMAL HIGH (ref 0.0–2.0)
Acid-base deficit: 7 mmol/L — ABNORMAL HIGH (ref 0.0–2.0)
Bicarbonate: 20.9 mmol/L (ref 20.0–28.0)
Bicarbonate: 21.9 mmol/L (ref 20.0–28.0)
Bicarbonate: 22.1 mmol/L (ref 20.0–28.0)
Bicarbonate: 22.2 mmol/L (ref 20.0–28.0)
Bicarbonate: 23.2 mmol/L (ref 20.0–28.0)
Bicarbonate: 23.3 mmol/L (ref 20.0–28.0)
Bicarbonate: 25 mmol/L (ref 20.0–28.0)
Bicarbonate: 29.5 mmol/L — ABNORMAL HIGH (ref 20.0–28.0)
Bicarbonate: 32.4 mmol/L — ABNORMAL HIGH (ref 20.0–28.0)
Calcium, Ion: 0.83 mmol/L — CL (ref 1.15–1.40)
Calcium, Ion: 0.85 mmol/L — CL (ref 1.15–1.40)
Calcium, Ion: 0.96 mmol/L — ABNORMAL LOW (ref 1.15–1.40)
Calcium, Ion: 1.02 mmol/L — ABNORMAL LOW (ref 1.15–1.40)
Calcium, Ion: 1.08 mmol/L — ABNORMAL LOW (ref 1.15–1.40)
Calcium, Ion: 1.09 mmol/L — ABNORMAL LOW (ref 1.15–1.40)
Calcium, Ion: 1.12 mmol/L — ABNORMAL LOW (ref 1.15–1.40)
Calcium, Ion: 1.13 mmol/L — ABNORMAL LOW (ref 1.15–1.40)
Calcium, Ion: 1.19 mmol/L (ref 1.15–1.40)
HCT: 17 % — ABNORMAL LOW (ref 36.0–46.0)
HCT: 19 % — ABNORMAL LOW (ref 36.0–46.0)
HCT: 22 % — ABNORMAL LOW (ref 36.0–46.0)
HCT: 23 % — ABNORMAL LOW (ref 36.0–46.0)
HCT: 25 % — ABNORMAL LOW (ref 36.0–46.0)
HCT: 26 % — ABNORMAL LOW (ref 36.0–46.0)
HCT: 26 % — ABNORMAL LOW (ref 36.0–46.0)
HCT: 28 % — ABNORMAL LOW (ref 36.0–46.0)
HCT: 29 % — ABNORMAL LOW (ref 36.0–46.0)
Hemoglobin: 5.8 g/dL — CL (ref 12.0–15.0)
Hemoglobin: 6.5 g/dL — CL (ref 12.0–15.0)
Hemoglobin: 7.5 g/dL — ABNORMAL LOW (ref 12.0–15.0)
Hemoglobin: 7.8 g/dL — ABNORMAL LOW (ref 12.0–15.0)
Hemoglobin: 8.5 g/dL — ABNORMAL LOW (ref 12.0–15.0)
Hemoglobin: 8.8 g/dL — ABNORMAL LOW (ref 12.0–15.0)
Hemoglobin: 8.8 g/dL — ABNORMAL LOW (ref 12.0–15.0)
Hemoglobin: 9.5 g/dL — ABNORMAL LOW (ref 12.0–15.0)
Hemoglobin: 9.9 g/dL — ABNORMAL LOW (ref 12.0–15.0)
O2 Saturation: 100 %
O2 Saturation: 100 %
O2 Saturation: 100 %
O2 Saturation: 100 %
O2 Saturation: 100 %
O2 Saturation: 100 %
O2 Saturation: 100 %
O2 Saturation: 96 %
O2 Saturation: 96 %
Patient temperature: 36.2
Patient temperature: 37.2
Potassium: 3.3 mmol/L — ABNORMAL LOW (ref 3.5–5.1)
Potassium: 3.4 mmol/L — ABNORMAL LOW (ref 3.5–5.1)
Potassium: 3.4 mmol/L — ABNORMAL LOW (ref 3.5–5.1)
Potassium: 3.4 mmol/L — ABNORMAL LOW (ref 3.5–5.1)
Potassium: 3.4 mmol/L — ABNORMAL LOW (ref 3.5–5.1)
Potassium: 3.4 mmol/L — ABNORMAL LOW (ref 3.5–5.1)
Potassium: 3.7 mmol/L (ref 3.5–5.1)
Potassium: 3.9 mmol/L (ref 3.5–5.1)
Potassium: 4 mmol/L (ref 3.5–5.1)
Sodium: 132 mmol/L — ABNORMAL LOW (ref 135–145)
Sodium: 134 mmol/L — ABNORMAL LOW (ref 135–145)
Sodium: 137 mmol/L (ref 135–145)
Sodium: 137 mmol/L (ref 135–145)
Sodium: 137 mmol/L (ref 135–145)
Sodium: 138 mmol/L (ref 135–145)
Sodium: 139 mmol/L (ref 135–145)
Sodium: 139 mmol/L (ref 135–145)
Sodium: 140 mmol/L (ref 135–145)
TCO2: 23 mmol/L (ref 22–32)
TCO2: 23 mmol/L (ref 22–32)
TCO2: 23 mmol/L (ref 22–32)
TCO2: 23 mmol/L (ref 22–32)
TCO2: 24 mmol/L (ref 22–32)
TCO2: 24 mmol/L (ref 22–32)
TCO2: 26 mmol/L (ref 22–32)
TCO2: 30 mmol/L (ref 22–32)
TCO2: 34 mmol/L — ABNORMAL HIGH (ref 22–32)
pCO2 arterial: 30.5 mmHg — ABNORMAL LOW (ref 32–48)
pCO2 arterial: 31.2 mmHg — ABNORMAL LOW (ref 32–48)
pCO2 arterial: 37.8 mmHg (ref 32–48)
pCO2 arterial: 38.2 mmHg (ref 32–48)
pCO2 arterial: 39.5 mmHg (ref 32–48)
pCO2 arterial: 40 mmHg (ref 32–48)
pCO2 arterial: 40.4 mmHg (ref 32–48)
pCO2 arterial: 46.2 mmHg (ref 32–48)
pCO2 arterial: 50.2 mmHg — ABNORMAL HIGH (ref 32–48)
pH, Arterial: 7.224 — ABNORMAL LOW (ref 7.35–7.45)
pH, Arterial: 7.356 (ref 7.35–7.45)
pH, Arterial: 7.373 (ref 7.35–7.45)
pH, Arterial: 7.376 (ref 7.35–7.45)
pH, Arterial: 7.391 (ref 7.35–7.45)
pH, Arterial: 7.399 (ref 7.35–7.45)
pH, Arterial: 7.454 — ABNORMAL HIGH (ref 7.35–7.45)
pH, Arterial: 7.455 — ABNORMAL HIGH (ref 7.35–7.45)
pH, Arterial: 7.595 — ABNORMAL HIGH (ref 7.35–7.45)
pO2, Arterial: 301 mmHg — ABNORMAL HIGH (ref 83–108)
pO2, Arterial: 341 mmHg — ABNORMAL HIGH (ref 83–108)
pO2, Arterial: 390 mmHg — ABNORMAL HIGH (ref 83–108)
pO2, Arterial: 422 mmHg — ABNORMAL HIGH (ref 83–108)
pO2, Arterial: 431 mmHg — ABNORMAL HIGH (ref 83–108)
pO2, Arterial: 443 mmHg — ABNORMAL HIGH (ref 83–108)
pO2, Arterial: 447 mmHg — ABNORMAL HIGH (ref 83–108)
pO2, Arterial: 88 mmHg (ref 83–108)
pO2, Arterial: 94 mmHg (ref 83–108)

## 2023-06-28 LAB — POCT I-STAT EG7
Acid-Base Excess: 8 mmol/L — ABNORMAL HIGH (ref 0.0–2.0)
Bicarbonate: 31.2 mmol/L — ABNORMAL HIGH (ref 20.0–28.0)
Calcium, Ion: 0.91 mmol/L — ABNORMAL LOW (ref 1.15–1.40)
HCT: 25 % — ABNORMAL LOW (ref 36.0–46.0)
Hemoglobin: 8.5 g/dL — ABNORMAL LOW (ref 12.0–15.0)
O2 Saturation: 77 %
Potassium: 2.9 mmol/L — ABNORMAL LOW (ref 3.5–5.1)
Sodium: 136 mmol/L (ref 135–145)
TCO2: 32 mmol/L (ref 22–32)
pCO2, Ven: 36.8 mmHg — ABNORMAL LOW (ref 44–60)
pH, Ven: 7.536 — ABNORMAL HIGH (ref 7.25–7.43)
pO2, Ven: 37 mmHg (ref 32–45)

## 2023-06-28 LAB — COOXEMETRY PANEL
Carboxyhemoglobin: 1.5 % (ref 0.5–1.5)
Methemoglobin: <0.7 % (ref 0.0–1.5)
O2 Saturation: 75.1 %
Total hemoglobin: 12.7 g/dL (ref 12.0–16.0)

## 2023-06-28 LAB — PREPARE RBC (CROSSMATCH)

## 2023-06-28 LAB — CBC
HCT: 36.5 % (ref 36.0–46.0)
HCT: 28.7 % — ABNORMAL LOW (ref 36.0–46.0)
HCT: 29.3 % — ABNORMAL LOW (ref 36.0–46.0)
Hemoglobin: 12.3 g/dL (ref 12.0–15.0)
Hemoglobin: 9.9 g/dL — ABNORMAL LOW (ref 12.0–15.0)
Hemoglobin: 10.1 g/dL — ABNORMAL LOW (ref 12.0–15.0)
MCH: 32.2 pg (ref 26.0–34.0)
MCH: 31.6 pg (ref 26.0–34.0)
MCH: 31.4 pg (ref 26.0–34.0)
MCHC: 33.7 g/dL (ref 30.0–36.0)
MCHC: 34.5 g/dL (ref 30.0–36.0)
MCHC: 34.5 g/dL (ref 30.0–36.0)
MCV: 95.5 fL (ref 80.0–100.0)
MCV: 91.7 fL (ref 80.0–100.0)
MCV: 91 fL (ref 80.0–100.0)
Platelets: 164 10*3/uL (ref 150–400)
Platelets: 97 10*3/uL — ABNORMAL LOW (ref 150–400)
Platelets: 104 10*3/uL — ABNORMAL LOW (ref 150–400)
RBC: 3.82 MIL/uL — ABNORMAL LOW (ref 3.87–5.11)
RBC: 3.13 MIL/uL — ABNORMAL LOW (ref 3.87–5.11)
RBC: 3.22 MIL/uL — ABNORMAL LOW (ref 3.87–5.11)
RDW: 13.4 % (ref 11.5–15.5)
RDW: 15.2 % (ref 11.5–15.5)
RDW: 15.5 % (ref 11.5–15.5)
WBC: 13.5 10*3/uL — ABNORMAL HIGH (ref 4.0–10.5)
WBC: 19.7 10*3/uL — ABNORMAL HIGH (ref 4.0–10.5)
WBC: 18.5 10*3/uL — ABNORMAL HIGH (ref 4.0–10.5)
nRBC: 0 % (ref 0.0–0.2)
nRBC: 0 % (ref 0.0–0.2)
nRBC: 0 % (ref 0.0–0.2)

## 2023-06-28 LAB — GLUCOSE, CAPILLARY
Glucose-Capillary: 129 mg/dL — ABNORMAL HIGH (ref 70–99)
Glucose-Capillary: 152 mg/dL — ABNORMAL HIGH (ref 70–99)
Glucose-Capillary: 171 mg/dL — ABNORMAL HIGH (ref 70–99)
Glucose-Capillary: 160 mg/dL — ABNORMAL HIGH (ref 70–99)
Glucose-Capillary: 206 mg/dL — ABNORMAL HIGH (ref 70–99)
Glucose-Capillary: 212 mg/dL — ABNORMAL HIGH (ref 70–99)

## 2023-06-28 LAB — HEMOGLOBIN AND HEMATOCRIT, BLOOD
HCT: 23.4 % — ABNORMAL LOW (ref 36.0–46.0)
Hemoglobin: 8.1 g/dL — ABNORMAL LOW (ref 12.0–15.0)

## 2023-06-28 LAB — BASIC METABOLIC PANEL WITH GFR
Anion gap: 13 (ref 5–15)
BUN: 15 mg/dL (ref 8–23)
CO2: 34 mmol/L — ABNORMAL HIGH (ref 22–32)
Calcium: 8.9 mg/dL (ref 8.9–10.3)
Chloride: 89 mmol/L — ABNORMAL LOW (ref 98–111)
Creatinine, Ser: 1.07 mg/dL — ABNORMAL HIGH (ref 0.44–1.00)
GFR, Estimated: 57 mL/min — ABNORMAL LOW (ref >60)
Glucose, Bld: 115 mg/dL — ABNORMAL HIGH (ref 70–99)
Potassium: 3.2 mmol/L — ABNORMAL LOW (ref 3.5–5.1)
Sodium: 136 mmol/L (ref 135–145)

## 2023-06-28 LAB — ECHO INTRAOPERATIVE TEE
Height: 67 in
Weight: 2656.1 [oz_av]

## 2023-06-28 LAB — FIBRINOGEN
Fibrinogen: 323 mg/dL (ref 210–475)
Fibrinogen: 194 mg/dL — ABNORMAL LOW (ref 210–475)

## 2023-06-28 LAB — PROTIME-INR
INR: 1 (ref 0.8–1.2)
INR: 1.1 (ref 0.8–1.2)
Prothrombin Time: 13.8 s (ref 11.4–15.2)
Prothrombin Time: 14.2 s (ref 11.4–15.2)

## 2023-06-28 LAB — APTT
aPTT: 43 s — ABNORMAL HIGH (ref 24–36)
aPTT: 44 s — ABNORMAL HIGH (ref 24–36)

## 2023-06-28 LAB — HEPARIN LEVEL (UNFRACTIONATED): Heparin Unfractionated: 0.36 [IU]/mL (ref 0.30–0.70)

## 2023-06-28 LAB — PLATELET COUNT: Platelets: 87 10*3/uL — ABNORMAL LOW (ref 150–400)

## 2023-06-28 SURGERY — REPLACEMENT, AORTIC VALVE, OPEN
Anesthesia: General | Site: Chest

## 2023-06-28 MED ORDER — NICARDIPINE HCL IN NACL 20-0.86 MG/200ML-% IV SOLN: 0.0000 mg/h | INTRAVENOUS | Status: DC

## 2023-06-28 MED ORDER — FENTANYL CITRATE (PF) 100 MCG/2ML IJ SOLN
INTRAMUSCULAR | Status: DC | PRN
Start: 2023-06-28 — End: 2023-06-28
  Administered 2023-06-28: 50 ug via INTRAVENOUS
  Administered 2023-06-28: 25 ug via INTRAVENOUS
  Administered 2023-06-28: 50 ug via INTRAVENOUS
  Administered 2023-06-28 (×2): 100 ug via INTRAVENOUS
  Administered 2023-06-28: 175 ug via INTRAVENOUS
  Administered 2023-06-28: 100 ug via INTRAVENOUS

## 2023-06-28 MED ORDER — ROCURONIUM BROMIDE 100 MG/10ML IV SOLN
INTRAVENOUS | Status: DC | PRN
  Administered 2023-06-28: 40 mg via INTRAVENOUS
  Administered 2023-06-28: 50 mg via INTRAVENOUS
  Administered 2023-06-28: 60 mg via INTRAVENOUS
  Administered 2023-06-28: 50 mg via INTRAVENOUS

## 2023-06-28 MED ORDER — CHLORHEXIDINE GLUCONATE CLOTH 2 % EX PADS
6.0000 | MEDICATED_PAD | Freq: Every day | CUTANEOUS | Status: DC
  Administered 2023-06-28 – 2023-06-30 (×3): 6 via TOPICAL

## 2023-06-28 MED ORDER — COAGULATION FACTOR VIIA RECOMB 1 MG IV SOLR
90.0000 ug/kg | Freq: Once | INTRAVENOUS | Status: AC
  Administered 2023-06-28: 7000 ug via INTRAVENOUS
  Filled 2023-06-28: qty 2

## 2023-06-28 MED ORDER — CHLORHEXIDINE GLUCONATE CLOTH 2 % EX PADS: 6.0000 | MEDICATED_PAD | Freq: Once | CUTANEOUS | Status: DC

## 2023-06-28 MED ORDER — BISACODYL 5 MG PO TBEC: 5.0000 mg | DELAYED_RELEASE_TABLET | Freq: Once | ORAL | Status: DC

## 2023-06-28 MED ORDER — LACTATED RINGERS IV SOLN: INTRAVENOUS | Status: AC

## 2023-06-28 MED ORDER — METOPROLOL TARTRATE 25 MG/10 ML ORAL SUSPENSION: 12.5000 mg | Freq: Two times a day (BID) | ORAL | Status: DC

## 2023-06-28 MED ORDER — CHLORHEXIDINE GLUCONATE 0.12 % MT SOLN: 15.0000 mL | Freq: Once | OROMUCOSAL | Status: DC

## 2023-06-28 MED ORDER — NOREPINEPHRINE 4 MG/250ML-% IV SOLN
0.0000 ug/min | INTRAVENOUS | Status: DC
  Administered 2023-06-28: 9 ug/min via INTRAVENOUS
  Administered 2023-06-29: 35 ug/min via INTRAVENOUS
  Administered 2023-06-29: 22 ug/min via INTRAVENOUS
  Administered 2023-06-29 (×2): 16 ug/min via INTRAVENOUS
  Administered 2023-06-29: 19 ug/min via INTRAVENOUS
  Administered 2023-06-30: 34 ug/min via INTRAVENOUS
  Filled 2023-06-28 (×10): qty 250

## 2023-06-28 MED ORDER — SODIUM CHLORIDE 0.9 % IV SOLN: INTRAVENOUS | Status: AC

## 2023-06-28 MED ORDER — SODIUM CHLORIDE 0.9% FLUSH
3.0000 mL | Freq: Two times a day (BID) | INTRAVENOUS | Status: DC
  Administered 2023-06-29 – 2023-06-30 (×4): 3 mL via INTRAVENOUS

## 2023-06-28 MED ORDER — FENTANYL CITRATE (PF) 250 MCG/5ML IJ SOLN
INTRAMUSCULAR | Status: AC
  Filled 2023-06-28: qty 5

## 2023-06-28 MED ORDER — CALCIUM CHLORIDE 10 % IV SOLN
INTRAVENOUS | Status: DC | PRN
  Administered 2023-06-28: 100 mg via INTRAVENOUS
  Administered 2023-06-28: 500 mg via INTRAVENOUS

## 2023-06-28 MED ORDER — ASPIRIN 81 MG PO CHEW
324.0000 mg | CHEWABLE_TABLET | Freq: Once | ORAL | Status: AC
  Administered 2023-06-28: 324 mg via ORAL
  Filled 2023-06-28: qty 4

## 2023-06-28 MED ORDER — METOCLOPRAMIDE HCL 5 MG/ML IJ SOLN
10.0000 mg | Freq: Four times a day (QID) | INTRAMUSCULAR | Status: AC
  Administered 2023-06-28 – 2023-06-30 (×6): 10 mg via INTRAVENOUS
  Filled 2023-06-28 (×5): qty 2

## 2023-06-28 MED ORDER — PROPOFOL 10 MG/ML IV BOLUS
INTRAVENOUS | Status: DC | PRN
  Administered 2023-06-28: 10 mg via INTRAVENOUS
  Administered 2023-06-28: 80 mg via INTRAVENOUS

## 2023-06-28 MED ORDER — DEXMEDETOMIDINE HCL IN NACL 400 MCG/100ML IV SOLN
0.0000 ug/kg/h | INTRAVENOUS | Status: DC
Start: 2023-06-28 — End: 2023-07-01
  Administered 2023-06-29: 0.7 ug/kg/h via INTRAVENOUS
  Filled 2023-06-28 (×2): qty 100

## 2023-06-28 MED ORDER — FENTANYL CITRATE (PF) 250 MCG/5ML IJ SOLN
INTRAMUSCULAR | Status: AC
Start: 2023-06-28 — End: ?
  Filled 2023-06-28: qty 5

## 2023-06-28 MED ORDER — ACETAMINOPHEN 160 MG/5ML PO SOLN
1000.0000 mg | Freq: Four times a day (QID) | ORAL | Status: DC
  Administered 2023-06-28 – 2023-06-30 (×3): 1000 mg
  Filled 2023-06-28 (×3): qty 40.6

## 2023-06-28 MED ORDER — OXYCODONE HCL 5 MG PO TABS
5.0000 mg | ORAL_TABLET | ORAL | Status: DC | PRN
  Administered 2023-06-29: 10 mg via ORAL
  Filled 2023-06-28: qty 2

## 2023-06-28 MED ORDER — PANTOPRAZOLE SODIUM 40 MG PO TBEC: 40.0000 mg | DELAYED_RELEASE_TABLET | Freq: Every day | ORAL | Status: DC

## 2023-06-28 MED ORDER — SODIUM CHLORIDE 0.45 % IV SOLN: INTRAVENOUS | Status: AC | PRN

## 2023-06-28 MED ORDER — HEPARIN SODIUM (PORCINE) 1000 UNIT/ML IJ SOLN
INTRAMUSCULAR | Status: AC
  Filled 2023-06-28: qty 1

## 2023-06-28 MED ORDER — PROTAMINE SULFATE 10 MG/ML IV SOLN
INTRAVENOUS | Status: DC | PRN
  Administered 2023-06-28 (×2): 270 mg via INTRAVENOUS
  Administered 2023-06-28: 50 mg via INTRAVENOUS

## 2023-06-28 MED ORDER — CHLORHEXIDINE GLUCONATE 0.12 % MT SOLN
15.0000 mL | Freq: Once | OROMUCOSAL | Status: AC
  Administered 2023-06-28: 15 mL via OROMUCOSAL
  Filled 2023-06-28: qty 15

## 2023-06-28 MED ORDER — ROCURONIUM BROMIDE 10 MG/ML (PF) SYRINGE
PREFILLED_SYRINGE | INTRAVENOUS | Status: AC
  Filled 2023-06-28: qty 10

## 2023-06-28 MED ORDER — CHLORHEXIDINE GLUCONATE 0.12 % MT SOLN
15.0000 mL | OROMUCOSAL | Status: AC
  Administered 2023-06-28: 15 mL via OROMUCOSAL
  Filled 2023-06-28: qty 15

## 2023-06-28 MED ORDER — CHLORHEXIDINE GLUCONATE CLOTH 2 % EX PADS
6.0000 | MEDICATED_PAD | Freq: Once | CUTANEOUS | Status: AC
  Administered 2023-06-28: 6 via TOPICAL

## 2023-06-28 MED ORDER — ALBUMIN HUMAN 5 % IV SOLN: 250.0000 mL | INTRAVENOUS | Status: DC | PRN

## 2023-06-28 MED ORDER — ORAL CARE MOUTH RINSE: 15.0000 mL | Freq: Once | OROMUCOSAL | Status: DC

## 2023-06-28 MED ORDER — HEPARIN SODIUM (PORCINE) 1000 UNIT/ML IJ SOLN
INTRAMUSCULAR | Status: DC | PRN
  Administered 2023-06-28: 27000 [IU] via INTRAVENOUS
  Administered 2023-06-28: 5000 [IU] via INTRAVENOUS
  Administered 2023-06-28: 27000 [IU] via INTRAVENOUS

## 2023-06-28 MED ORDER — PHENYLEPHRINE HCL (PRESSORS) 10 MG/ML IV SOLN
INTRAVENOUS | Status: DC | PRN
  Administered 2023-06-28 (×3): 80 ug via INTRAVENOUS

## 2023-06-28 MED ORDER — TEMAZEPAM 7.5 MG PO CAPS: 15.0000 mg | ORAL_CAPSULE | Freq: Once | ORAL | Status: DC | PRN

## 2023-06-28 MED ORDER — EPHEDRINE 5 MG/ML INJ
INTRAVENOUS | Status: AC
  Filled 2023-06-28: qty 5

## 2023-06-28 MED ORDER — PROPOFOL 10 MG/ML IV BOLUS
INTRAVENOUS | Status: AC
  Filled 2023-06-28: qty 20

## 2023-06-28 MED ORDER — PHENYLEPHRINE HCL-NACL 20-0.9 MG/250ML-% IV SOLN: 0.0000 ug/min | INTRAVENOUS | Status: DC

## 2023-06-28 MED ORDER — PANTOPRAZOLE SODIUM 40 MG IV SOLR
40.0000 mg | Freq: Every day | INTRAVENOUS | Status: AC
  Administered 2023-06-28 – 2023-06-29 (×2): 40 mg via INTRAVENOUS
  Filled 2023-06-28 (×2): qty 10

## 2023-06-28 MED ORDER — THROMBIN 20000 UNITS EX SOLR: OROMUCOSAL | Status: DC | PRN

## 2023-06-28 MED ORDER — HEMOSTATIC AGENTS (NO CHARGE) OPTIME
TOPICAL | Status: DC | PRN
  Administered 2023-06-28: 3 via TOPICAL
  Administered 2023-06-28: 2

## 2023-06-28 MED ORDER — CEFAZOLIN SODIUM-DEXTROSE 2-3 GM-%(50ML) IV SOLR
INTRAVENOUS | Status: DC | PRN
Start: 2023-06-28 — End: 2023-06-28
  Administered 2023-06-28: 2 g via INTRAVENOUS

## 2023-06-28 MED ORDER — INSULIN REGULAR(HUMAN) IN NACL 100-0.9 UT/100ML-% IV SOLN
INTRAVENOUS | Status: DC
  Administered 2023-06-29: 3.2 [IU]/h via INTRAVENOUS
  Filled 2023-06-28: qty 100

## 2023-06-28 MED ORDER — MORPHINE SULFATE (PF) 2 MG/ML IV SOLN
1.0000 mg | INTRAVENOUS | Status: DC | PRN
  Administered 2023-06-28 – 2023-06-29 (×2): 1 mg via INTRAVENOUS
  Administered 2023-06-29 (×2): 2 mg via INTRAVENOUS
  Filled 2023-06-28 (×4): qty 1

## 2023-06-28 MED ORDER — DOCUSATE SODIUM 100 MG PO CAPS
200.0000 mg | ORAL_CAPSULE | Freq: Every day | ORAL | Status: DC
  Administered 2023-06-29: 200 mg via ORAL
  Filled 2023-06-28: qty 2

## 2023-06-28 MED ORDER — 0.9 % SODIUM CHLORIDE (POUR BTL) OPTIME
TOPICAL | Status: DC | PRN
  Administered 2023-06-28: 10000 mL

## 2023-06-28 MED ORDER — SODIUM CHLORIDE 0.9 % IV SOLN: INTRAVENOUS | Status: DC | PRN

## 2023-06-28 MED ORDER — POTASSIUM CHLORIDE 10 MEQ/50ML IV SOLN
10.0000 meq | INTRAVENOUS | Status: AC
  Administered 2023-06-28 (×3): 10 meq via INTRAVENOUS
  Filled 2023-06-28 (×2): qty 50

## 2023-06-28 MED ORDER — CEFAZOLIN SODIUM-DEXTROSE 2-4 GM/100ML-% IV SOLN
2.0000 g | Freq: Three times a day (TID) | INTRAVENOUS | Status: DC
  Administered 2023-06-28 – 2023-06-29 (×4): 2 g via INTRAVENOUS
  Filled 2023-06-28 (×3): qty 100

## 2023-06-28 MED ORDER — LACTATED RINGERS IV SOLN: INTRAVENOUS | Status: DC | PRN

## 2023-06-28 MED ORDER — PROTAMINE SULFATE 10 MG/ML IV SOLN
INTRAVENOUS | Status: AC
  Filled 2023-06-28: qty 5

## 2023-06-28 MED ORDER — ALBUMIN HUMAN 5 % IV SOLN: INTRAVENOUS | Status: DC | PRN

## 2023-06-28 MED ORDER — SODIUM CHLORIDE 0.9 % IV SOLN
20.0000 ug | Freq: Once | INTRAVENOUS | Status: AC
  Administered 2023-06-28: 20 ug via INTRAVENOUS
  Filled 2023-06-28: qty 5

## 2023-06-28 MED ORDER — PROTAMINE SULFATE 10 MG/ML IV SOLN
INTRAVENOUS | Status: AC
  Filled 2023-06-28: qty 50

## 2023-06-28 MED ORDER — LIDOCAINE 2% (20 MG/ML) 5 ML SYRINGE
INTRAMUSCULAR | Status: AC
Start: 2023-06-28 — End: ?
  Filled 2023-06-28: qty 5

## 2023-06-28 MED ORDER — MIDAZOLAM HCL (PF) 5 MG/ML IJ SOLN
INTRAMUSCULAR | Status: DC | PRN
  Administered 2023-06-28 (×3): 1 mg via INTRAVENOUS
  Administered 2023-06-28: 2 mg via INTRAVENOUS
  Administered 2023-06-28: 1 mg via INTRAVENOUS

## 2023-06-28 MED ORDER — ASPIRIN 325 MG PO TBEC: 325.0000 mg | DELAYED_RELEASE_TABLET | Freq: Every day | ORAL | Status: DC

## 2023-06-28 MED ORDER — ONDANSETRON HCL 4 MG/2ML IJ SOLN
4.0000 mg | Freq: Four times a day (QID) | INTRAMUSCULAR | Status: DC | PRN
  Administered 2023-06-29: 4 mg via INTRAVENOUS
  Filled 2023-06-28: qty 2

## 2023-06-28 MED ORDER — MILRINONE LACTATE IN DEXTROSE 20-5 MG/100ML-% IV SOLN
0.2500 ug/kg/min | INTRAVENOUS | Status: DC
  Administered 2023-06-29: 0.3 ug/kg/min via INTRAVENOUS
  Filled 2023-06-28 (×3): qty 100

## 2023-06-28 MED ORDER — MIDAZOLAM HCL (PF) 10 MG/2ML IJ SOLN
INTRAMUSCULAR | Status: AC
  Filled 2023-06-28: qty 2

## 2023-06-28 MED ORDER — BISACODYL 5 MG PO TBEC
10.0000 mg | DELAYED_RELEASE_TABLET | Freq: Every day | ORAL | Status: DC
  Administered 2023-06-29 – 2023-06-30 (×2): 10 mg via ORAL
  Filled 2023-06-28 (×2): qty 2

## 2023-06-28 MED ORDER — MIDAZOLAM HCL 2 MG/2ML IJ SOLN
2.0000 mg | INTRAMUSCULAR | Status: DC | PRN
  Administered 2023-06-28 – 2023-06-29 (×6): 2 mg via INTRAVENOUS
  Filled 2023-06-28 (×6): qty 2

## 2023-06-28 MED ORDER — ACETAMINOPHEN 160 MG/5ML PO SOLN
650.0000 mg | Freq: Once | ORAL | Status: AC
  Administered 2023-06-28: 650 mg
  Filled 2023-06-28: qty 20.3

## 2023-06-28 MED ORDER — MIDAZOLAM HCL 2 MG/2ML IJ SOLN
INTRAMUSCULAR | Status: AC
  Administered 2023-06-28: 2 mg via INTRAVENOUS
  Filled 2023-06-28: qty 2

## 2023-06-28 MED ORDER — MAGNESIUM SULFATE 4 GM/100ML IV SOLN
4.0000 g | Freq: Once | INTRAVENOUS | Status: AC
Start: 2023-06-28 — End: 2023-06-28
  Administered 2023-06-28: 4 g via INTRAVENOUS
  Filled 2023-06-28: qty 100

## 2023-06-28 MED ORDER — METOPROLOL TARTRATE 5 MG/5ML IV SOLN: 2.5000 mg | INTRAVENOUS | Status: DC | PRN

## 2023-06-28 MED ORDER — ACETAMINOPHEN 500 MG PO TABS
1000.0000 mg | ORAL_TABLET | Freq: Four times a day (QID) | ORAL | Status: DC
  Administered 2023-06-29 (×2): 1000 mg via ORAL
  Filled 2023-06-28 (×2): qty 2

## 2023-06-28 MED ORDER — EPINEPHRINE HCL 5 MG/250ML IV SOLN IN NS
0.5000 ug/min | INTRAVENOUS | Status: DC
  Administered 2023-06-29: 9 ug/min via INTRAVENOUS
  Administered 2023-06-29: 6 ug/min via INTRAVENOUS
  Administered 2023-06-29: 20 ug/min via INTRAVENOUS
  Administered 2023-06-30: 11 ug/min via INTRAVENOUS
  Administered 2023-06-30: 16 ug/min via INTRAVENOUS
  Administered 2023-06-30: 17 ug/min via INTRAVENOUS
  Administered 2023-07-01: 20 ug/min via INTRAVENOUS
  Filled 2023-06-28 (×3): qty 250
  Filled 2023-06-28: qty 500
  Filled 2023-06-28 (×3): qty 250

## 2023-06-28 MED ORDER — METOPROLOL TARTRATE 12.5 MG HALF TABLET
12.5000 mg | ORAL_TABLET | Freq: Once | ORAL | Status: DC
  Filled 2023-06-28: qty 1

## 2023-06-28 MED ORDER — VANCOMYCIN HCL IN DEXTROSE 1-5 GM/200ML-% IV SOLN
1000.0000 mg | Freq: Once | INTRAVENOUS | Status: AC
  Administered 2023-06-28: 1000 mg via INTRAVENOUS
  Filled 2023-06-28: qty 200

## 2023-06-28 MED ORDER — DEXTROSE 50 % IV SOLN: 0.0000 mL | INTRAVENOUS | Status: DC | PRN

## 2023-06-28 MED ORDER — EPHEDRINE SULFATE-NACL 50-0.9 MG/10ML-% IV SOSY
PREFILLED_SYRINGE | INTRAVENOUS | Status: DC | PRN
  Administered 2023-06-28: 5 mg via INTRAVENOUS

## 2023-06-28 MED ORDER — TRAMADOL HCL 50 MG PO TABS: 50.0000 mg | ORAL_TABLET | ORAL | Status: DC | PRN

## 2023-06-28 MED ORDER — DIAZEPAM 5 MG PO TABS
5.0000 mg | ORAL_TABLET | Freq: Once | ORAL | Status: AC
  Administered 2023-06-28: 5 mg via ORAL
  Filled 2023-06-28: qty 1

## 2023-06-28 MED ORDER — THROMBIN (RECOMBINANT) 20000 UNITS EX SOLR
CUTANEOUS | Status: AC
  Filled 2023-06-28: qty 20000

## 2023-06-28 MED ORDER — BISACODYL 10 MG RE SUPP: 10.0000 mg | Freq: Every day | RECTAL | Status: DC

## 2023-06-28 MED ORDER — METOPROLOL TARTRATE 12.5 MG HALF TABLET
12.5000 mg | ORAL_TABLET | Freq: Two times a day (BID) | ORAL | Status: DC
Start: 2023-06-28 — End: 2023-06-29

## 2023-06-28 MED ORDER — LACTATED RINGERS IV SOLN: INTRAVENOUS | Status: DC

## 2023-06-28 MED ORDER — HEPARIN SODIUM (PORCINE) 1000 UNIT/ML IJ SOLN
INTRAMUSCULAR | Status: AC
Start: 2023-06-28 — End: ?
  Filled 2023-06-28: qty 10

## 2023-06-28 MED ORDER — PHENYLEPHRINE 80 MCG/ML (10ML) SYRINGE FOR IV PUSH (FOR BLOOD PRESSURE SUPPORT)
PREFILLED_SYRINGE | INTRAVENOUS | Status: AC
  Filled 2023-06-28: qty 10

## 2023-06-28 MED ORDER — SODIUM CHLORIDE 0.9% FLUSH: 3.0000 mL | INTRAVENOUS | Status: DC | PRN

## 2023-06-28 MED ORDER — ASPIRIN 81 MG PO CHEW
324.0000 mg | CHEWABLE_TABLET | Freq: Every day | ORAL | Status: DC
  Administered 2023-06-29 – 2023-06-30 (×2): 324 mg
  Filled 2023-06-28 (×2): qty 4

## 2023-06-28 MED FILL — Lidocaine HCl Local Preservative Free (PF) Inj 2%: INTRAMUSCULAR | Qty: 14 | Status: AC

## 2023-06-28 MED FILL — Heparin Sodium (Porcine) Inj 1000 Unit/ML: Qty: 1000 | Status: AC

## 2023-06-28 MED FILL — Potassium Chloride Inj 2 mEq/ML: INTRAVENOUS | Qty: 40 | Status: AC

## 2023-06-28 SURGICAL SUPPLY — 91 items
ADAPTER CARDIO PERF ANTE/RETRO (ADAPTER) ×3 IMPLANT
BAG DECANTER FOR FLEXI CONT (MISCELLANEOUS) ×3 IMPLANT
BLADE CLIPPER SURG (BLADE) ×3 IMPLANT
BLADE STERNUM SYSTEM 6 (BLADE) ×3 IMPLANT
BLADE SURG 11 STRL SS (BLADE) IMPLANT
BLADE SURG 15 STRL LF DISP TIS (BLADE) ×3 IMPLANT
CANISTER SUCTION 3000ML PPV (SUCTIONS) ×3 IMPLANT
CANNULA AORTIC ROOT 9FR (CANNULA) IMPLANT
CANNULA ARTERIAL NVNT 3/8 20FR (MISCELLANEOUS) IMPLANT
CANNULA GUNDRY RCSP 15FR (MISCELLANEOUS) ×3 IMPLANT
CANNULA MC2 2 STG 36/46 NON-V (CANNULA) IMPLANT
CANNULA SUMP PERICARDIAL (CANNULA) ×3 IMPLANT
CANNULA VRC MALB SNGL STG 28FR (MISCELLANEOUS) IMPLANT
CANNULA VRC MALB SNGL STG 34FR (MISCELLANEOUS) IMPLANT
CATH HEART VENT LEFT (CATHETERS) ×3 IMPLANT
CATH ROBINSON RED A/P 18FR (CATHETERS) ×9 IMPLANT
CATH THORACIC 36FR (CATHETERS) ×3 IMPLANT
CATH THORACIC 36FR RT ANG (CATHETERS) ×3 IMPLANT
CLAMP ISOLATOR SYNERGY LG (MISCELLANEOUS) IMPLANT
CNTNR URN SCR LID CUP LEK RST (MISCELLANEOUS) ×3 IMPLANT
CONN 1/2X1/2X1/2 BEN (MISCELLANEOUS) ×3 IMPLANT
CONN ST 3/8 X 1/2 (MISCELLANEOUS) ×6 IMPLANT
CONTAINER PROTECT SURGISLUSH (MISCELLANEOUS) ×6 IMPLANT
COVER SURGICAL LIGHT HANDLE (MISCELLANEOUS) ×6 IMPLANT
COVER ULTRASOUND PROBE 36 ST (MISCELLANEOUS) IMPLANT
DEVICE ATRICLIP LAA PRCLPII 40 (Clip) IMPLANT
DEVICE SUT CK QUICK LOAD MINI (Prosthesis & Implant Heart) IMPLANT
DRAPE SLUSH/WARMER DISC (DRAPES) IMPLANT
DRAPE WARM FLUID 44X44 (DRAPES) ×3 IMPLANT
DRSG COVADERM 4X14 (GAUZE/BANDAGES/DRESSINGS) ×3 IMPLANT
ELECT CAUTERY BLADE 6.4 (BLADE) ×3 IMPLANT
ELECT LEAD UNIPOLAR MYOCARDIAL (ELECTRODE) IMPLANT
ELECTRODE REM PT RTRN 9FT ADLT (ELECTROSURGICAL) ×6 IMPLANT
FELT TEFLON 1X6 (MISCELLANEOUS) ×6 IMPLANT
GAUZE SPONGE 4X4 12PLY STRL (GAUZE/BANDAGES/DRESSINGS) ×6 IMPLANT
GLOVE ECLIPSE 7.0 STRL STRAW (GLOVE) IMPLANT
GOWN STRL REUS W/ TWL LRG LVL3 (GOWN DISPOSABLE) ×12 IMPLANT
GOWN STRL REUS W/ TWL XL LVL3 (GOWN DISPOSABLE) ×3 IMPLANT
HEMOSTAT POWDER SURGIFOAM 1G (HEMOSTASIS) ×9 IMPLANT
HEMOSTAT SURGICEL 2X14 (HEMOSTASIS) ×3 IMPLANT
KIT BASIN OR (CUSTOM PROCEDURE TRAY) ×3 IMPLANT
KIT SUCTION CATH 14FR (SUCTIONS) ×3 IMPLANT
KIT SUT CK MINI COMBO 4X17 (Prosthesis & Implant Heart) IMPLANT
KIT TURNOVER KIT B (KITS) ×3 IMPLANT
LINE VENT (MISCELLANEOUS) IMPLANT
LOOP VASCLR EXTRA MAXI WHITE (MISCELLANEOUS) ×3 IMPLANT
LOOPS VASCLR EXTRA MAXI WHITE (MISCELLANEOUS) ×3 IMPLANT
MARKER SKIN DUAL TIP RULER LAB (MISCELLANEOUS) IMPLANT
NDL 27GX1/2 REG BEVEL ECLIP (NEEDLE) IMPLANT
NEEDLE 27GX1/2 REG BEVEL ECLIP (NEEDLE) ×3 IMPLANT
NS IRRIG 1000ML POUR BTL (IV SOLUTION) ×18 IMPLANT
PACK E OPEN HEART (SUTURE) ×3 IMPLANT
PACK OPEN HEART (CUSTOM PROCEDURE TRAY) ×3 IMPLANT
PAD ARMBOARD POSITIONER FOAM (MISCELLANEOUS) ×6 IMPLANT
POSITIONER HEAD DONUT 9IN (MISCELLANEOUS) ×3 IMPLANT
PROBE CRYO2-ABLATION MALLABLE (MISCELLANEOUS) IMPLANT
SET MICROPUNCTURE 5F STIFF (MISCELLANEOUS) IMPLANT
SET MPS 3-ND DEL (MISCELLANEOUS) IMPLANT
SHEATH PROBE COVER 6X72 (BAG) IMPLANT
SPONGE T-LAP 18X18 ~~LOC~~+RFID (SPONGE) ×12 IMPLANT
SPONGE T-LAP 4X18 ~~LOC~~+RFID (SPONGE) ×3 IMPLANT
STOPCOCK 4 WAY LG BORE MALE ST (IV SETS) IMPLANT
SUT BONE WAX W31G (SUTURE) ×3 IMPLANT
SUT EB EXC GRN/WHT 2-0 V-5 (SUTURE) ×6 IMPLANT
SUT ETHIBON EXCEL 2-0 V-5 (SUTURE) IMPLANT
SUT ETHIBOND 2 0 SH (SUTURE) IMPLANT
SUT ETHIBOND 2 0 SH 36X2 (SUTURE) ×3 IMPLANT
SUT ETHIBOND V-5 VALVE (SUTURE) IMPLANT
SUT PROLENE 3 0 SH 48 (SUTURE) IMPLANT
SUT PROLENE 3 0 SH DA (SUTURE) IMPLANT
SUT PROLENE 3 0 SH1 36 (SUTURE) ×3 IMPLANT
SUT PROLENE 4-0 RB1 .5 CRCL 36 (SUTURE) ×9 IMPLANT
SUT PROLENE 5 0 C 1 36 (SUTURE) IMPLANT
SUT SILK 1 MH (SUTURE) IMPLANT
SUT SILK 2 0 SH CR/8 (SUTURE) IMPLANT
SUT STEEL 6MS V (SUTURE) IMPLANT
SUT VIC AB 1 CTX36XBRD ANBCTR (SUTURE) ×6 IMPLANT
SYSTEM SAHARA CHEST DRAIN ATS (WOUND CARE) ×3 IMPLANT
TAPE CLOTH SURG 4X10 WHT LF (GAUZE/BANDAGES/DRESSINGS) IMPLANT
TAPE PAPER 2X10 WHT MICROPORE (GAUZE/BANDAGES/DRESSINGS) IMPLANT
TOWEL GREEN STERILE (TOWEL DISPOSABLE) ×3 IMPLANT
TOWEL GREEN STERILE FF (TOWEL DISPOSABLE) ×3 IMPLANT
TRAY CATH LUMEN 1 20CM STRL (SET/KITS/TRAYS/PACK) IMPLANT
TRAY FOLEY SLVR 14FR TEMP STAT (SET/KITS/TRAYS/PACK) IMPLANT
TUBE SUCT INTRACARD DLP 20F (MISCELLANEOUS) IMPLANT
TUBING ART PRESS 48 MALE/FEM (TUBING) IMPLANT
UNDERPAD 30X36 HEAVY ABSORB (UNDERPADS AND DIAPERS) ×3 IMPLANT
VALVE AORTIC SZ21 INSP/RESIL (Valve) IMPLANT
VALVE MITRAL SZ 27 (Prosthesis & Implant Heart) IMPLANT
WATER STERILE IRR 1000ML POUR (IV SOLUTION) ×6 IMPLANT
WIRE TORQFLEX AUST .018X40CM (WIRE) IMPLANT

## 2023-06-28 NOTE — Anesthesia Procedure Notes (Signed)
 Procedure Name: Intubation Date/Time: 06/28/2023 7:36 AM  Performed by: Rochelle Chu, CRNAPre-anesthesia Checklist: Patient identified, Emergency Drugs available, Suction available and Patient being monitored Patient Re-evaluated:Patient Re-evaluated prior to induction Oxygen  Delivery Method: Circle system utilized Preoxygenation: Pre-oxygenation with 100% oxygen  Induction Type: IV induction Ventilation: Mask ventilation without difficulty Laryngoscope Size: Mac and 3 Grade View: Grade II Tube type: Oral Number of attempts: 1 Airway Equipment and Method: Stylet and Oral airway Placement Confirmation: ETT inserted through vocal cords under direct vision, positive ETCO2 and breath sounds checked- equal and bilateral Secured at: 21 cm Tube secured with: Tape Dental Injury: Teeth and Oropharynx as per pre-operative assessment

## 2023-06-28 NOTE — Brief Op Note (Signed)
 06/24/2023 - 06/28/2023  12:55 PM  PATIENT:  Karla Foster  68 y.o. female  PRE-OPERATIVE DIAGNOSIS:  SEVERE MITRAL REGURGITATION  SEVERE AORTIC INSUFFICIENCY ATRIAL FIBRILLATION  POST-OPERATIVE DIAGNOSIS:  SEVERE MITRAL REGURGITATION SEVERE AORTIC INSUFFICIENCY ATRIAL FIBRILLATION  PROCEDURE:  Procedure(s): AORTIC VALVE REPLACEMENT USING 21 MM INSPIRIS AORTIC VALVE ([name]) MITRAL VALVE REPLACEMENT USING 27 MM MEDTRONIC MOSAIC BIOPROSTHESIS MITRAL VALVE ([name]) [facility] MAZE PROCEDURE ([name]) ECHOCARDIOGRAM, TRANSESOPHAGEAL, INTRAOPERATIVE ([name]) CLIPPING, LEFT ATRIAL APPENDAGE USING 40 MM ATRICURE ATRICLIP  SURGEON:  Surgeons and Role:    * Bartle, Shellie Dials, MD - Primary  PHYSICIAN ASSISTANT: Itzayana Pardy PA-C  ASSISTANTS: KRISTINA PRICE RNFA   ANESTHESIA:   general  EBL:  {None/Minimal: 21241}   BLOOD ADMINISTERED:1 UNIT OF PLTS  DRAINS: MEDIASTINAL CHEST TUBES   LOCAL MEDICATIONS USED:  NONE  SPECIMEN:  Source of Specimen:  AORTIC AND MITRAL VALVE LEAFLETS  DISPOSITION OF SPECIMEN:  PATHOLOGY  COUNTS:  YES  TOURNIQUET:  * No tourniquets in log *  DICTATION: .Dragon Dictation  PLAN OF CARE: Admit to inpatient   PATIENT DISPOSITION:  ICU - intubated and hemodynamically stable.   Delay start of Pharmacological VTE agent (>24hrs) due to surgical blood loss or risk of bleeding: yes  COMPLICATIONS: NO KNOWN

## 2023-06-28 NOTE — Op Note (Signed)
 CARDIOVASCULAR SURGERY OPERATIVE NOTE  06/28/2023 Karla Foster [id number]  Surgeon:  Bartley Lightning, MD  First Assistant: Matt Song,  PA-C: An experienced assistant was required given the complexity of this surgery and the standard of surgical care. The assistant was needed for exposure, dissection, suctioning, retraction of delicate tissues and sutures, instrument exchange and for overall help during this procedure.    Preoperative Diagnosis:  Rheumatic valve disease with severe aortic insufficiency and severe mitral insufficiency, atrial fibrillation.   Postoperative Diagnosis:  Same   Procedure:  Median Sternotomy Extracorporeal circulation 3.   Aortic valve replacement using a 21 mm Edwards INSPIRIS RESILIA pericardial   valve. 4.   Mitral valve replacement using a 27 mm Medtronic Mosaic porcine valve. 5.   Biatrial MAZE procedure using radiofrequency and cryotherapy. 6.   Clipping of left atrial appendage.  Anesthesia:  General Endotracheal   Clinical History/Surgical Indication:  This 68 year old woman presented with a several month history of progressive shortness of breath and fatigue and found to be in AF with RVR with hypotension. Initial echo showed an EF of 30-35% with global hypokinesis, moderate RV systolic dysfunction. There was severe MR and AI with rheumatic appearing valves. There was moderate TR. Cath showed normal coronaries with mean PCWP of 25 and mean PAP 35. Venous sat 50% with arterial sat 93%, CI 1.9. She improved with diuresis and conversion to sinus rhythm with DCCV. Her EF improved to 50% with mild RV systolic dysfunction. I agree that the best treatment for her is AVR/MVR with bioprosthetic valves and MAZE.  I discussed the operative procedure with the patient and family including alternatives, benefits and risks; including but not limited to bleeding, blood transfusion, infection, stroke, myocardial infarction, graft failure, heart block  requiring a permanent pacemaker, organ dysfunction, and death.  Karla Foster understands and agrees to proceed.   Preparation:  The patient was seen in the preoperative holding area and the correct patient, correct operation were confirmed with the patient after reviewing the medical record and catheterization. The consent was signed by me. Preoperative antibiotics were given. A pulmonary arterial line and radial arterial line were placed by the anesthesia team. The patient was taken back to the operating room and positioned supine on the operating room table. After being placed under general endotracheal anesthesia by the anesthesia team a foley catheter was placed. The neck, chest, abdomen, and both legs were prepped with betadine soap and solution and draped in the usual sterile manner. A surgical time-out was taken and the correct patient and operative procedure were confirmed with the nursing and anesthesia staff.  TEE:    *INTRAOPERATIVE TRANSESOPHAGEAL REPORT *      Patient Name:   Karla Foster Date of Exam: 06/28/2023  Medical Rec #:  [id number]               Height:       67.0 in  Accession #:    [id number]              Weight:       166.0 lb  Date of Birth:  1955-12-23               BSA:          1.87 m  Patient Age:    68 years                BP:           85/46  mmHg  Patient Gender: F                       HR:           62 bpm.  Exam Location:  Anesthesiology   Transesophogeal exam was perform intraoperatively during surgical  procedure.  Patient was closely monitored under general anesthesia during the entirety  of  examination.   Indications:    Aortic Insufficiency i35.1                   Mitral Regurgitation i34.0  Sonographer:     Sherline Distel Senior RDCS  Performing Phys: [id number] Bartley Lightning  Diagnosing Phys: Ellena Gurney MD   PROCEDURE: Intraoperative Transesophogeal 3D imaging performed to better  assess valves.  Complications: No known  complications during this procedure.  POST-OP IMPRESSIONS  _ Aortic Valve: A bioprosthetic valve was placed, leaflets are freely  mobile and  thin. Manufactured by; Inspiris Size; 21mm. The gradient recorded across  the  prosthetic valve is within the expected range.Mean PG 5 mmHg across the  new  bioprosthetic valve.  _ Mitral Valve: A bioprosthetic valve was placed, leaflets are freely  mobile and  thin. Manufactured by; Medtronic Size; 27 mm .Mean PG 4 mmHg across the  new  prosthetic valve.   PRE-OP FINDINGS   Left Ventricle: The left ventricle has mild-moderately reduced systolic  function, with an ejection fraction of 40-45%. The cavity size was mildly  dilated. There is no increase in left ventricular wall thickness. Left  ventrical global hypokinesis without   regional wall motion abnormalities. There is no left ventricular  hypertrophy.    Right Ventricle: The right ventricle has normal systolic function. The  cavity was normal. There is no increase in right ventricular wall  thickness.   Left Atrium: Left atrial size was dilated. No left atrial/left atrial  appendage thrombus was detected.   Right Atrium: Right atrial size was dilated.   Interatrial Septum: No atrial level shunt detected by color flow Doppler.   Pericardium: There is no evidence of pericardial effusion.   Mitral Valve: The mitral valve is rheumatic. Mitral valve regurgitation is  severe by color flow Doppler. There is No evidence of mitral stenosis.  Both the anterior and posterior leaflets are thickened, shortened, and  have moderately restricted mobility.   Tricuspid Valve: The tricuspid valve was normal in structure. Tricuspid  valve regurgitation is moderate by color flow Doppler.   Aortic Valve: The aortic valve is tricuspid Aortic valve regurgitation is  severe by color flow Doppler. There is no stenosis of the aortic valve.    Pulmonic Valve: The pulmonic valve was normal in  structure.  Pulmonic valve regurgitation is not visualized by color flow Doppler.    Aorta: The aortic root, ascending aorta and aortic arch are normal in size  and structure.     Ellena Gurney MD  Electronically signed by Ellena Gurney MD  Signature Date/Time: 06/28/2023/4:00:01 PM        Final       Cardiopulmonary Bypass:  A median sternotomy was performed. The pericardium was opened in the midline. Right ventricular function appeared normal. The ascending aorta was of normal size and had no palpable plaque. There were no contraindications to aortic cannulation or cross-clamping. The patient was fully systemically heparinized and the ACT was maintained > 400 sec. The proximal aortic arch was cannulated with a 20 F aortic cannula for arterial inflow. Bi-caval  venous cannulation was performed via the SVC using a 28 F metal tip right angle cannula and IVC using a 34 F malleable plastic cannula. An antegrade cardioplegia/vent cannula was inserted into the mid-ascending aorta.  A retrograde cardioplegia cannnula was placed into the coronary sinus via the right atrium. Aortic occlusion was performed with a single cross-clamp. Systemic cooling to 32 degrees Centigrade and topical cooling of the heart with iced saline were used. Cold KBC retrograde cardioplegia was used to induce diastolic arrest and then given retrograde cardioplegia at about 60 minute intervals. A temperature probe was inserted into the interventricular septum and an insulating pad was placed in the pericardium. Carbon dioxide was insufflated into the pericardium at 5L/min throughout the procedure to minimize intracardiac air.  Biatrial MAZE procedure:  The left pulmonary veins were encircled with a tape. A radiofrequency lesion was performed around the veins on the left atrial wall. 3 applications with two firings were done for all RF lesions. The right pulmonary veins were encircled and a lesion was placed around them on the  left atrial wall using RF.  The left atrium was opened longitudinally along the interatrial groove. A 3 minute cryo lesion was performed between the pulmonary veins superiorly and inferiorly. Then a cryo lesion was placed from the inferior pulmonary vein lesion down to the mitral annulus at P2.   The right atrium was opened obliquely and an RF lesion was performed laterally up to the SVC and down to the IVC. Another RF lesion was performed along the lateral wall up to the tip of the right atrial appendage. Then a cryo lesion was performed down to the tricuspid annulus posteriorly. At the conclusion of the procedure, after the retrograde cannula was removed, a cryo lesion was performed across the coronary sinus .  Clipping of left atrial appendage:    The base of the appendage was measured and a 40 mm Atricure Pro2 clip was chosen. This was placed across the base of the LAA without difficulty.    Mitral Valve Replacement:    Valve inspection showed markedly thickened rheumatic appearing leaflets with thickened and fused chordae. The chordae were all fused to the ventricular wall. I had to completely excise both leaflets and the thickened and fused chordae in order to get a valve to fit in the annulus.  A series of pledgetted 2-0 Ethibond sutures were placed around the mitral annulus. A 27 mm Medtronic Mosaic porcine valve was chosen ( model 310, [place] [id number]). The sutures were placed through the sewing ring and it was lowered into place. The sutures were tied using CorKnots. The valve was tested with saline and there was regurgitation. The atrium was closed with 2 layers of continuous 3-0 prolene suture.     Aortic Valve Replacement:   A transverse aortotomy was performed 1 cm above the take-off of the right coronary artery. The native valve was tricuspid with markedly thickend rheumatic appearing leaflets. The leaflets were retracted with no central coaptation. The ostia of the coronary arteries were  in normal position and were not obstructed. The native valve leaflets were excised.  Care was taken to remove all particulate debris. The left ventricle was directly inspected for debris and then irrigated with ice saline solution. The annulus was sized and a size 21 mm Edwards INSPIRIS RESILIA pericardial valve was chosen. The model number was [id number] and the serial number was [id number]. While the valve was being prepared 2-0 Ethibond pledgeted horizontal mattress sutures were placed around the  annulus with the pledgets in a sub-annular position. The sutures were placed through the sewing ring and the valve lowered into place. The sutures were tied using CorKnots. The valve seated nicely and the coronary ostia were not obstructed. The prosthetic valve leaflets moved normally and there was no sub-valvular obstruction. The aortotomy was closed using 4-0 Prolene suture in 2 layers with felt strips to reinforce the closure.  Completion:  The patient was rewarmed to 37 degrees Centigrade. De-airing maneuvers were performed and the head placed in trendelenburg position. The crossclamp was removed with a time of 224 minutes. There was spontaneous return of sinus rhythm. Two temporary epicardial pacing wires were placed on the right atrium and two on the right ventricle. The left ventricular vent and retrograde cardioplegia cannulas were removed. The patient was weaned from CPB without difficulty on milrinone  and epi. CPB time was 305 minutes. Cardiac output was 5 LPM. Heparin  was fully reversed with protamine. There was suddenly significant bright red blood from the inferior wall and a laceration was seen which I suspect was from retraction of the heart since her tissues were fragile. I had to re-heparinize and stop the heart with cardioplegia to place two pledgetted 3-0 Prolene horizontal mattress sutures to close this area. The cross clamp was removed after an additional 9 minutes. There was still some oozing from the  inferior wall but no major bleeding site. The previously seen hole was closed. He was again weaned form CP bypass after an additional 28 minutes. TEE showed good LV systolic function and the valves were functioning fine.  The aortic and venous cannulas were removed. Hemostasis was achieved. Mediastinal drainage tubes were placed. The sternum was closed with  #6 stainless steel wires. The fascia was closed with continuous # 1 vicryl suture. The subcutaneous tissue was closed with 2-0 vicryl continuous suture. The skin was closed with 3-0 vicryl subcuticular suture. All sponge, needle, and instrument counts were reported correct at the end of the case. Dry sterile dressings were placed over the incisions and around the chest tubes which were connected to pleurevac suction. The patient was then transported to the surgical intensive care unit in stable condition.

## 2023-06-28 NOTE — Anesthesia Procedure Notes (Signed)
 Central Venous Catheter Insertion Performed by: Ellena Gurney, MD, anesthesiologist Start/End5/10/2023 7:05 AM, 06/28/2023 7:15 AM Patient location: Pre-op. Preanesthetic checklist: patient identified, IV checked, site marked, risks and benefits discussed, surgical consent, monitors and equipment checked, pre-op evaluation, timeout performed and anesthesia consent Lidocaine  1% used for infiltration and patient sedated Hand hygiene performed  and maximum sterile barriers used  Catheter size: 9 Fr MAC introducer Procedure performed using ultrasound guided technique. Ultrasound Notes:anatomy identified, needle tip was noted to be adjacent to the nerve/plexus identified, no ultrasound evidence of intravascular and/or intraneural injection and image(s) printed for medical record Attempts: 1 Following insertion, line sutured and dressing applied. Post procedure assessment: blood return through all ports, free fluid flow and no air  Patient tolerated the procedure well with no immediate complications.

## 2023-06-28 NOTE — Progress Notes (Signed)
      [address]       Karla Foster [address]             [phone]      S/p AVR, MVR, maze  BP (!) 79/56   Pulse 79   Temp 97.7 F (36.5 C)   Resp (!) 24   Ht 5\' 7"  (1.702 m)   Wt 75.3 kg   SpO2 100%   BMI 26.00 kg/m  Currently 95/54 34/18 CI 1.6  Epi 2, milrinone  0.25, norepi 7   Intake/Output Summary (Last 24 hours) at 06/28/2023 2019 Last data filed at 06/28/2023 2000 Gross per 24 hour  Intake 8274.2 ml  Output 1705 ml  Net 6569.2 ml   Minimal CT output  Doing well early postop  Index should improve with volume  Landon Pinion C. Luna Salinas, MD [facility] [phone]

## 2023-06-28 NOTE — Transfer of Care (Signed)
 Immediate Anesthesia Transfer of Care Note  Patient: Karla Foster  Procedure(s) Performed: AORTIC VALVE REPLACEMENT USING 21 MM INSPIRIS AORTIC VALVE (Chest) MITRAL VALVE REPLACEMENT USING 27 MM MEDTRONIC MOSAIC BIOPROSTHESIS MITRAL VALVE (Chest) COX MAZE PROCEDURE (Chest) ECHOCARDIOGRAM, TRANSESOPHAGEAL, INTRAOPERATIVE CLIPPING, LEFT ATRIAL APPENDAGE USING 40 MM ATRICURE ATRICLIP  Patient Location: SICU  Anesthesia Type:General  Level of Consciousness: Patient remains intubated per anesthesia plan  Airway & Oxygen  Therapy: Patient remains intubated per anesthesia plan and Patient placed on Ventilator (see vital sign flow sheet for setting)  Post-op Assessment: Post -op Vital signs reviewed and stable  Post vital signs: Reviewed and stable  Last Vitals:  Vitals Value Taken Time  BP 94/48 06/28/23 1800  Temp    Pulse 80 06/28/23 1800  Resp 16 06/28/23 1800  SpO2      Last Pain:  Vitals:   06/28/23 0651  TempSrc:   PainSc: 0-No pain      Patients Stated Pain Goal: 3 (06/24/23 1923)  Complications: No notable events documented.

## 2023-06-28 NOTE — Anesthesia Procedure Notes (Signed)
 Central Venous Catheter Insertion Performed by: Ellena Gurney, MD, anesthesiologist Start/End5/10/2023 7:15 AM, 06/28/2023 7:17 AM Patient location: Pre-op. Preanesthetic checklist: patient identified, IV checked, site marked, risks and benefits discussed, surgical consent, monitors and equipment checked, pre-op evaluation, timeout performed and anesthesia consent Hand hygiene performed  and maximum sterile barriers used  PA cath was placed.Swan type:thermodilution Procedure performed without using ultrasound guided technique. Attempts: 1 Patient tolerated the procedure well with no immediate complications.

## 2023-06-28 NOTE — Interval H&P Note (Signed)
 History and Physical Interval Note:  06/28/2023 6:56 AM  Karla Foster  has presented today for surgery, with the diagnosis of SEVERE MR SEVERE AI AFIB.  The various methods of treatment have been discussed with the patient and family. After consideration of risks, benefits and other options for treatment, the patient has consented to  Procedure(s): REPLACEMENT, AORTIC VALVE, OPEN ([name]) REPLACEMENT, MITRAL VALVE ([name]) COX MAZE PROCEDURE ([name]) ECHOCARDIOGRAM, TRANSESOPHAGEAL, INTRAOPERATIVE ([name]) as a surgical intervention.  The patient's history has been reviewed, patient examined, no change in status, stable for surgery.  I have reviewed the patient's chart and labs.  Questions were answered to the patient's satisfaction.     Callan Yontz K Cortana Vanderford

## 2023-06-28 NOTE — Anesthesia Preprocedure Evaluation (Signed)
 Anesthesia Evaluation  Patient identified by MRN, date of birth, ID band Patient awake    Reviewed: Allergy & Precautions, H&P , NPO status , Patient's Chart, lab work & pertinent test results  Airway Mallampati: II   Neck ROM: full    Dental   Pulmonary COPD, Patient abstained from smoking., former smoker   breath sounds clear to auscultation       Cardiovascular hypertension, + Valvular Problems/Murmurs AI and MR  Rhythm:regular Rate:Normal  TEE (06/24/23): EF 50%, severe MR, mod-severe AI, moderate TR.  Mildly dilated LA and RA.   Neuro/Psych  Headaches PSYCHIATRIC DISORDERS Anxiety Depression       GI/Hepatic ,GERD  ,,  Endo/Other   Hyperthyroidism   Renal/GU      Musculoskeletal  (+) Arthritis ,    Abdominal   Peds  Hematology   Anesthesia Other Findings   Reproductive/Obstetrics                             Anesthesia Physical Anesthesia Plan  ASA: 3  Anesthesia Plan: General   Post-op Pain Management:    Induction: Intravenous  PONV Risk Score and Plan: 3 and Ondansetron , Dexamethasone, Midazolam  and Treatment may vary due to age or medical condition  Airway Management Planned: Oral ETT  Additional Equipment: Arterial line, CVP, PA Cath, TEE and Ultrasound Guidance Line Placement  Intra-op Plan:   Post-operative Plan: Extubation in OR  Informed Consent: I have reviewed the patients History and Physical, chart, labs and discussed the procedure including the risks, benefits and alternatives for the proposed anesthesia with the patient or authorized representative who has indicated his/her understanding and acceptance.     Dental advisory given  Plan Discussed with: CRNA, Anesthesiologist and Surgeon  Anesthesia Plan Comments:        Anesthesia Quick Evaluation

## 2023-06-28 NOTE — Progress Notes (Signed)
 PT Cancellation Note  Patient Details Name: Karla Foster MRN: [id number] DOB: Dec 19, 1955   Cancelled Treatment:    Reason Eval/Treat Not Completed: Patient at procedure or test/unavailable (Pt off floor for aortic valve replacement, mitral valve replacement, cox maze procedure, and transesophageal echocardiogram. Will follow-up for PT treatment as schedule permits.)  Glenford Lanes, PT, DPT Acute Rehabilitation Services Office: [phone] Secure Chat Preferred  Riva Chester 06/28/2023, 7:13 AM

## 2023-06-28 NOTE — Plan of Care (Addendum)
 Pt resting throughout shift. Aox4, VSS. No c/o pain. NPO since 12. CHG bath given. BM monitored. Skin warm, dry, intact. Scattered bruising to Bilat upper extremities. Voiding w/o difficulty. No BM this shift, enteric precautions maintained. Heparin  gtt infusing. Safety precautions in place, call light within reach.   Problem: Education: Goal: Knowledge of General Education information will improve Description: Including pain rating scale, medication(s)/side effects and non-pharmacologic comfort measures Outcome: Progressing   Problem: Health Behavior/Discharge Planning: Goal: Ability to manage health-related needs will improve Outcome: Progressing   Problem: Clinical Measurements: Goal: Ability to maintain clinical measurements within normal limits will improve Outcome: Progressing Goal: Will remain free from infection Outcome: Progressing Goal: Diagnostic test results will improve Outcome: Progressing Goal: Respiratory complications will improve Outcome: Progressing Goal: Cardiovascular complication will be avoided Outcome: Progressing   Problem: Activity: Goal: Risk for activity intolerance will decrease Outcome: Progressing   Problem: Nutrition: Goal: Adequate nutrition will be maintained Outcome: Progressing   Problem: Coping: Goal: Level of anxiety will decrease Outcome: Progressing   Problem: Elimination: Goal: Will not experience complications related to bowel motility Outcome: Progressing Goal: Will not experience complications related to urinary retention Outcome: Progressing   Problem: Pain Managment: Goal: General experience of comfort will improve and/or be controlled Outcome: Progressing   Problem: Safety: Goal: Ability to remain free from injury will improve Outcome: Progressing   Problem: Skin Integrity: Goal: Risk for impaired skin integrity will decrease Outcome: Progressing   Problem: Education: Goal: Understanding of CV disease, CV risk  reduction, and recovery process will improve Outcome: Progressing Goal: Individualized Educational Video(s) Outcome: Progressing   Problem: Activity: Goal: Ability to return to baseline activity level will improve Outcome: Progressing   Problem: Cardiovascular: Goal: Ability to achieve and maintain adequate cardiovascular perfusion will improve Outcome: Progressing Goal: Vascular access site(s) Level 0-1 will be maintained Outcome: Progressing   Problem: Health Behavior/Discharge Planning: Goal: Ability to safely manage health-related needs after discharge will improve Outcome: Progressing   Problem: Education: Goal: Ability to describe self-care measures that may prevent or decrease complications (Diabetes Survival Skills Education) will improve Outcome: Progressing Goal: Individualized Educational Video(s) Outcome: Progressing   Problem: Coping: Goal: Ability to adjust to condition or change in health will improve Outcome: Progressing   Problem: Fluid Volume: Goal: Ability to maintain a balanced intake and output will improve Outcome: Progressing   Problem: Health Behavior/Discharge Planning: Goal: Ability to identify and utilize available resources and services will improve Outcome: Progressing Goal: Ability to manage health-related needs will improve Outcome: Progressing   Problem: Metabolic: Goal: Ability to maintain appropriate glucose levels will improve Outcome: Progressing   Problem: Nutritional: Goal: Maintenance of adequate nutrition will improve Outcome: Progressing Goal: Progress toward achieving an optimal weight will improve Outcome: Progressing   Problem: Skin Integrity: Goal: Risk for impaired skin integrity will decrease Outcome: Progressing   Problem: Tissue Perfusion: Goal: Adequacy of tissue perfusion will improve Outcome: Progressing

## 2023-06-28 NOTE — Anesthesia Procedure Notes (Signed)
 Arterial Line Insertion Start/End5/10/2023 7:10 AM Performed by: Rochelle Chu, CRNA, CRNA  Patient location: Pre-op. Preanesthetic checklist: patient identified, IV checked, site marked, risks and benefits discussed, surgical consent, monitors and equipment checked, pre-op evaluation, timeout performed and anesthesia consent Lidocaine  1% used for infiltration Left, radial was placed Catheter size: 20 G Hand hygiene performed  and maximum sterile barriers used   Attempts: 1 Procedure performed without using ultrasound guided technique. Following insertion, dressing applied and Biopatch. Post procedure assessment: normal and unchanged  Patient tolerated the procedure well with no immediate complications.

## 2023-06-28 NOTE — Progress Notes (Signed)
 Patient failed wean and was placed on full vent support due to ABG results. RN notified.

## 2023-06-29 ENCOUNTER — Inpatient Hospital Stay (HOSPITAL_COMMUNITY)

## 2023-06-29 DIAGNOSIS — I509 Heart failure, unspecified: Secondary | ICD-10-CM

## 2023-06-29 DIAGNOSIS — J810 Acute pulmonary edema: Secondary | ICD-10-CM | POA: Diagnosis not present

## 2023-06-29 DIAGNOSIS — Z953 Presence of xenogenic heart valve: Secondary | ICD-10-CM | POA: Diagnosis not present

## 2023-06-29 DIAGNOSIS — J9601 Acute respiratory failure with hypoxia: Secondary | ICD-10-CM

## 2023-06-29 DIAGNOSIS — N179 Acute kidney failure, unspecified: Secondary | ICD-10-CM

## 2023-06-29 DIAGNOSIS — I4891 Unspecified atrial fibrillation: Secondary | ICD-10-CM

## 2023-06-29 DIAGNOSIS — E8721 Acute metabolic acidosis: Secondary | ICD-10-CM

## 2023-06-29 LAB — PREPARE FRESH FROZEN PLASMA

## 2023-06-29 LAB — CBC
HCT: 28.8 % — ABNORMAL LOW (ref 36.0–46.0)
HCT: 25.4 % — ABNORMAL LOW (ref 36.0–46.0)
HCT: 29.4 % — ABNORMAL LOW (ref 36.0–46.0)
Hemoglobin: 9.7 g/dL — ABNORMAL LOW (ref 12.0–15.0)
Hemoglobin: 8.5 g/dL — ABNORMAL LOW (ref 12.0–15.0)
Hemoglobin: 9.9 g/dL — ABNORMAL LOW (ref 12.0–15.0)
MCH: 30.9 pg (ref 26.0–34.0)
MCH: 30.9 pg (ref 26.0–34.0)
MCH: 31.1 pg (ref 26.0–34.0)
MCHC: 33.7 g/dL (ref 30.0–36.0)
MCHC: 33.5 g/dL (ref 30.0–36.0)
MCHC: 33.7 g/dL (ref 30.0–36.0)
MCV: 91.7 fL (ref 80.0–100.0)
MCV: 92.4 fL (ref 80.0–100.0)
MCV: 92.5 fL (ref 80.0–100.0)
Platelets: 132 10*3/uL — ABNORMAL LOW (ref 150–400)
Platelets: 95 10*3/uL — ABNORMAL LOW (ref 150–400)
Platelets: 80 10*3/uL — ABNORMAL LOW (ref 150–400)
RBC: 3.14 MIL/uL — ABNORMAL LOW (ref 3.87–5.11)
RBC: 2.75 MIL/uL — ABNORMAL LOW (ref 3.87–5.11)
RBC: 3.18 MIL/uL — ABNORMAL LOW (ref 3.87–5.11)
RDW: 16.2 % — ABNORMAL HIGH (ref 11.5–15.5)
RDW: 16.1 % — ABNORMAL HIGH (ref 11.5–15.5)
RDW: 15.9 % — ABNORMAL HIGH (ref 11.5–15.5)
WBC: 19.7 10*3/uL — ABNORMAL HIGH (ref 4.0–10.5)
WBC: 19.8 10*3/uL — ABNORMAL HIGH (ref 4.0–10.5)
WBC: 19.8 10*3/uL — ABNORMAL HIGH (ref 4.0–10.5)
nRBC: 0 % (ref 0.0–0.2)
nRBC: 0 % (ref 0.0–0.2)
nRBC: 0.1 % (ref 0.0–0.2)

## 2023-06-29 LAB — BASIC METABOLIC PANEL WITH GFR
Anion gap: 10 (ref 5–15)
Anion gap: 14 (ref 5–15)
Anion gap: 18 — ABNORMAL HIGH (ref 5–15)
BUN: 16 mg/dL (ref 8–23)
BUN: 18 mg/dL (ref 8–23)
BUN: 19 mg/dL (ref 8–23)
CO2: 20 mmol/L — ABNORMAL LOW (ref 22–32)
CO2: 18 mmol/L — ABNORMAL LOW (ref 22–32)
CO2: 25 mmol/L (ref 22–32)
Calcium: 7.7 mg/dL — ABNORMAL LOW (ref 8.9–10.3)
Calcium: 7.5 mg/dL — ABNORMAL LOW (ref 8.9–10.3)
Calcium: 7.2 mg/dL — ABNORMAL LOW (ref 8.9–10.3)
Chloride: 106 mmol/L (ref 98–111)
Chloride: 100 mmol/L (ref 98–111)
Chloride: 92 mmol/L — ABNORMAL LOW (ref 98–111)
Creatinine, Ser: 1.35 mg/dL — ABNORMAL HIGH (ref 0.44–1.00)
Creatinine, Ser: 1.63 mg/dL — ABNORMAL HIGH (ref 0.44–1.00)
Creatinine, Ser: 1.65 mg/dL — ABNORMAL HIGH (ref 0.44–1.00)
GFR, Estimated: 43 mL/min — ABNORMAL LOW (ref >60)
GFR, Estimated: 34 mL/min — ABNORMAL LOW (ref >60)
GFR, Estimated: 34 mL/min — ABNORMAL LOW (ref >60)
Glucose, Bld: 159 mg/dL — ABNORMAL HIGH (ref 70–99)
Glucose, Bld: 132 mg/dL — ABNORMAL HIGH (ref 70–99)
Glucose, Bld: 137 mg/dL — ABNORMAL HIGH (ref 70–99)
Potassium: 3.5 mmol/L (ref 3.5–5.1)
Potassium: 4.1 mmol/L (ref 3.5–5.1)
Potassium: 3.9 mmol/L (ref 3.5–5.1)
Sodium: 136 mmol/L (ref 135–145)
Sodium: 132 mmol/L — ABNORMAL LOW (ref 135–145)
Sodium: 135 mmol/L (ref 135–145)

## 2023-06-29 LAB — POCT I-STAT 7, (LYTES, BLD GAS, ICA,H+H)
Acid-base deficit: 6 mmol/L — ABNORMAL HIGH (ref 0.0–2.0)
Acid-base deficit: 3 mmol/L — ABNORMAL HIGH (ref 0.0–2.0)
Acid-base deficit: 4 mmol/L — ABNORMAL HIGH (ref 0.0–2.0)
Acid-base deficit: 8 mmol/L — ABNORMAL HIGH (ref 0.0–2.0)
Acid-base deficit: 10 mmol/L — ABNORMAL HIGH (ref 0.0–2.0)
Acid-base deficit: 8 mmol/L — ABNORMAL HIGH (ref 0.0–2.0)
Bicarbonate: 19.7 mmol/L — ABNORMAL LOW (ref 20.0–28.0)
Bicarbonate: 22.5 mmol/L (ref 20.0–28.0)
Bicarbonate: 20.3 mmol/L (ref 20.0–28.0)
Bicarbonate: 18 mmol/L — ABNORMAL LOW (ref 20.0–28.0)
Bicarbonate: 15.3 mmol/L — ABNORMAL LOW (ref 20.0–28.0)
Bicarbonate: 17.9 mmol/L — ABNORMAL LOW (ref 20.0–28.0)
Calcium, Ion: 1.17 mmol/L (ref 1.15–1.40)
Calcium, Ion: 1.1 mmol/L — ABNORMAL LOW (ref 1.15–1.40)
Calcium, Ion: 1.09 mmol/L — ABNORMAL LOW (ref 1.15–1.40)
Calcium, Ion: 1.08 mmol/L — ABNORMAL LOW (ref 1.15–1.40)
Calcium, Ion: 1.06 mmol/L — ABNORMAL LOW (ref 1.15–1.40)
Calcium, Ion: 1.05 mmol/L — ABNORMAL LOW (ref 1.15–1.40)
HCT: 26 % — ABNORMAL LOW (ref 36.0–46.0)
HCT: 25 % — ABNORMAL LOW (ref 36.0–46.0)
HCT: 25 % — ABNORMAL LOW (ref 36.0–46.0)
HCT: 24 % — ABNORMAL LOW (ref 36.0–46.0)
HCT: 21 % — ABNORMAL LOW (ref 36.0–46.0)
HCT: 24 % — ABNORMAL LOW (ref 36.0–46.0)
Hemoglobin: 8.8 g/dL — ABNORMAL LOW (ref 12.0–15.0)
Hemoglobin: 8.5 g/dL — ABNORMAL LOW (ref 12.0–15.0)
Hemoglobin: 8.5 g/dL — ABNORMAL LOW (ref 12.0–15.0)
Hemoglobin: 8.2 g/dL — ABNORMAL LOW (ref 12.0–15.0)
Hemoglobin: 7.1 g/dL — ABNORMAL LOW (ref 12.0–15.0)
Hemoglobin: 8.2 g/dL — ABNORMAL LOW (ref 12.0–15.0)
O2 Saturation: 97 %
O2 Saturation: 96 %
O2 Saturation: 98 %
O2 Saturation: 95 %
O2 Saturation: 96 %
O2 Saturation: 100 %
Patient temperature: 36.6
Patient temperature: 36.2
Patient temperature: 36.6
Patient temperature: 36.4
Potassium: 3.2 mmol/L — ABNORMAL LOW (ref 3.5–5.1)
Potassium: 3.2 mmol/L — ABNORMAL LOW (ref 3.5–5.1)
Potassium: 3.5 mmol/L (ref 3.5–5.1)
Potassium: 3.8 mmol/L (ref 3.5–5.1)
Potassium: 3.5 mmol/L (ref 3.5–5.1)
Potassium: 4 mmol/L (ref 3.5–5.1)
Sodium: 137 mmol/L (ref 135–145)
Sodium: 138 mmol/L (ref 135–145)
Sodium: 137 mmol/L (ref 135–145)
Sodium: 134 mmol/L — ABNORMAL LOW (ref 135–145)
Sodium: 132 mmol/L — ABNORMAL LOW (ref 135–145)
Sodium: 129 mmol/L — ABNORMAL LOW (ref 135–145)
TCO2: 21 mmol/L — ABNORMAL LOW (ref 22–32)
TCO2: 24 mmol/L (ref 22–32)
TCO2: 21 mmol/L — ABNORMAL LOW (ref 22–32)
TCO2: 19 mmol/L — ABNORMAL LOW (ref 22–32)
TCO2: 16 mmol/L — ABNORMAL LOW (ref 22–32)
TCO2: 19 mmol/L — ABNORMAL LOW (ref 22–32)
pCO2 arterial: 40.4 mmHg (ref 32–48)
pCO2 arterial: 40 mmHg (ref 32–48)
pCO2 arterial: 33.8 mmHg (ref 32–48)
pCO2 arterial: 36.2 mmHg (ref 32–48)
pCO2 arterial: 32.4 mmHg (ref 32–48)
pCO2 arterial: 37.6 mmHg (ref 32–48)
pH, Arterial: 7.295 — ABNORMAL LOW (ref 7.35–7.45)
pH, Arterial: 7.354 (ref 7.35–7.45)
pH, Arterial: 7.385 (ref 7.35–7.45)
pH, Arterial: 7.302 — ABNORMAL LOW (ref 7.35–7.45)
pH, Arterial: 7.283 — ABNORMAL LOW (ref 7.35–7.45)
pH, Arterial: 7.286 — ABNORMAL LOW (ref 7.35–7.45)
pO2, Arterial: 94 mmHg (ref 83–108)
pO2, Arterial: 84 mmHg (ref 83–108)
pO2, Arterial: 103 mmHg (ref 83–108)
pO2, Arterial: 81 mmHg — ABNORMAL LOW (ref 83–108)
pO2, Arterial: 90 mmHg (ref 83–108)
pO2, Arterial: 304 mmHg — ABNORMAL HIGH (ref 83–108)

## 2023-06-29 LAB — PREPARE PLATELET PHERESIS
Unit division: 0
Unit division: 0
Unit division: 0

## 2023-06-29 LAB — PREPARE CRYOPRECIPITATE
Unit division: 0
Unit division: 0

## 2023-06-29 LAB — GLUCOSE, CAPILLARY
Glucose-Capillary: 102 mg/dL — ABNORMAL HIGH (ref 70–99)
Glucose-Capillary: 123 mg/dL — ABNORMAL HIGH (ref 70–99)
Glucose-Capillary: 123 mg/dL — ABNORMAL HIGH (ref 70–99)
Glucose-Capillary: 140 mg/dL — ABNORMAL HIGH (ref 70–99)
Glucose-Capillary: 144 mg/dL — ABNORMAL HIGH (ref 70–99)
Glucose-Capillary: 153 mg/dL — ABNORMAL HIGH (ref 70–99)
Glucose-Capillary: 155 mg/dL — ABNORMAL HIGH (ref 70–99)
Glucose-Capillary: 156 mg/dL — ABNORMAL HIGH (ref 70–99)
Glucose-Capillary: 157 mg/dL — ABNORMAL HIGH (ref 70–99)
Glucose-Capillary: 170 mg/dL — ABNORMAL HIGH (ref 70–99)
Glucose-Capillary: 171 mg/dL — ABNORMAL HIGH (ref 70–99)
Glucose-Capillary: 180 mg/dL — ABNORMAL HIGH (ref 70–99)
Glucose-Capillary: 181 mg/dL — ABNORMAL HIGH (ref 70–99)
Glucose-Capillary: 184 mg/dL — ABNORMAL HIGH (ref 70–99)
Glucose-Capillary: 196 mg/dL — ABNORMAL HIGH (ref 70–99)
Glucose-Capillary: 209 mg/dL — ABNORMAL HIGH (ref 70–99)
Glucose-Capillary: 88 mg/dL (ref 70–99)
Glucose-Capillary: 89 mg/dL (ref 70–99)

## 2023-06-29 LAB — BPAM PLATELET PHERESIS
Blood Product Expiration Date: 202505112359
Blood Product Expiration Date: 202505112359
Blood Product Expiration Date: 202505112359
ISSUE DATE / TIME: 202505091246
ISSUE DATE / TIME: 202505091452
ISSUE DATE / TIME: 202505091724
Unit Type and Rh: 5100
Unit Type and Rh: 5100
Unit Type and Rh: 5100

## 2023-06-29 LAB — BPAM CRYOPRECIPITATE
Blood Product Expiration Date: 202505142359
Blood Product Expiration Date: 202505142359
ISSUE DATE / TIME: 202505091724
ISSUE DATE / TIME: 202505091951
Unit Type and Rh: 6200
Unit Type and Rh: 6200

## 2023-06-29 LAB — CULTURE, BLOOD (ROUTINE X 2)
Culture: NO GROWTH
Culture: NO GROWTH

## 2023-06-29 LAB — BPAM FFP
Blood Product Expiration Date: 202505142359
Blood Product Expiration Date: 202505142359
ISSUE DATE / TIME: 202505091449
ISSUE DATE / TIME: 202505091449
Unit Type and Rh: 5100
Unit Type and Rh: 5100

## 2023-06-29 LAB — COOXEMETRY PANEL
Carboxyhemoglobin: 1.1 % (ref 0.5–1.5)
Carboxyhemoglobin: 2.6 % — ABNORMAL HIGH (ref 0.5–1.5)
Methemoglobin: <0.7 % (ref 0.0–1.5)
Methemoglobin: 0.9 % (ref 0.0–1.5)
O2 Saturation: 48.8 %
O2 Saturation: 89.3 %
Total hemoglobin: 9.8 g/dL — ABNORMAL LOW (ref 12.0–16.0)
Total hemoglobin: 8.9 g/dL — ABNORMAL LOW (ref 12.0–16.0)

## 2023-06-29 LAB — MAGNESIUM
Magnesium: 3.5 mg/dL — ABNORMAL HIGH (ref 1.7–2.4)
Magnesium: 3.1 mg/dL — ABNORMAL HIGH (ref 1.7–2.4)
Magnesium: 2.7 mg/dL — ABNORMAL HIGH (ref 1.7–2.4)

## 2023-06-29 LAB — CG4 I-STAT (LACTIC ACID): Lactic Acid, Venous: 5.7 mmol/L (ref 0.5–1.9)

## 2023-06-29 LAB — PREPARE RBC (CROSSMATCH)

## 2023-06-29 MED ORDER — SODIUM BICARBONATE 8.4 % IV SOLN
100.0000 meq | Freq: Once | INTRAVENOUS | Status: AC
  Administered 2023-06-29: 100 meq via INTRAVENOUS
  Filled 2023-06-29: qty 100

## 2023-06-29 MED ORDER — DULOXETINE HCL 60 MG PO CPEP
60.0000 mg | ORAL_CAPSULE | Freq: Every day | ORAL | Status: DC
  Administered 2023-06-29: 60 mg via ORAL
  Filled 2023-06-29: qty 1

## 2023-06-29 MED ORDER — PHENYLEPHRINE 80 MCG/ML (10ML) SYRINGE FOR IV PUSH (FOR BLOOD PRESSURE SUPPORT)
PREFILLED_SYRINGE | INTRAVENOUS | Status: AC
  Filled 2023-06-29: qty 10

## 2023-06-29 MED ORDER — FENTANYL CITRATE PF 50 MCG/ML IJ SOSY: 25.0000 ug | PREFILLED_SYRINGE | Freq: Once | INTRAMUSCULAR | Status: DC

## 2023-06-29 MED ORDER — GABAPENTIN 300 MG PO CAPS
300.0000 mg | ORAL_CAPSULE | Freq: Three times a day (TID) | ORAL | Status: DC
  Administered 2023-06-29 (×2): 300 mg via ORAL
  Filled 2023-06-29 (×2): qty 1

## 2023-06-29 MED ORDER — MIDAZOLAM HCL 2 MG/2ML IJ SOLN
INTRAMUSCULAR | Status: AC
  Administered 2023-06-29: 2 mg via INTRAVENOUS
  Filled 2023-06-29: qty 2

## 2023-06-29 MED ORDER — HEPARIN SODIUM (PORCINE) 1000 UNIT/ML DIALYSIS
1000.0000 [IU] | INTRAMUSCULAR | Status: DC | PRN
  Filled 2023-06-29: qty 6

## 2023-06-29 MED ORDER — POLYETHYLENE GLYCOL 3350 17 G PO PACK
17.0000 g | PACK | Freq: Every day | ORAL | Status: DC
  Administered 2023-06-30: 17 g
  Filled 2023-06-29: qty 1

## 2023-06-29 MED ORDER — CLONAZEPAM 1 MG PO TABS
1.0000 mg | ORAL_TABLET | Freq: Two times a day (BID) | ORAL | Status: DC
  Administered 2023-06-29: 1 mg via ORAL
  Filled 2023-06-29: qty 1

## 2023-06-29 MED ORDER — ROCURONIUM BROMIDE 10 MG/ML (PF) SYRINGE
PREFILLED_SYRINGE | INTRAVENOUS | Status: AC
  Administered 2023-06-29: 25 mg via INTRAVENOUS
  Filled 2023-06-29: qty 10

## 2023-06-29 MED ORDER — VASOPRESSIN 20 UNITS/100 ML INFUSION FOR SHOCK
INTRAVENOUS | Status: AC
  Administered 2023-06-29: 0.04 [IU]/min via INTRAVENOUS
  Filled 2023-06-29: qty 100

## 2023-06-29 MED ORDER — SODIUM CHLORIDE 0.9 % IV SOLN
1.0000 g | Freq: Three times a day (TID) | INTRAVENOUS | Status: DC
  Administered 2023-06-30 – 2023-07-01 (×3): 1 g via INTRAVENOUS
  Filled 2023-06-29 (×3): qty 20

## 2023-06-29 MED ORDER — CALCIUM GLUCONATE-NACL 1-0.675 GM/50ML-% IV SOLN
1.0000 g | Freq: Once | INTRAVENOUS | Status: AC
  Administered 2023-06-29: 1000 mg via INTRAVENOUS

## 2023-06-29 MED ORDER — PRISMASOL BGK 4/2.5 32-4-2.5 MEQ/L EC SOLN: Status: DC

## 2023-06-29 MED ORDER — HEPARIN SODIUM (PORCINE) 1000 UNIT/ML DIALYSIS: 1000.0000 [IU] | INTRAMUSCULAR | Status: DC | PRN

## 2023-06-29 MED ORDER — POTASSIUM CHLORIDE 10 MEQ/50ML IV SOLN
10.0000 meq | INTRAVENOUS | Status: AC
  Administered 2023-06-29 (×3): 10 meq via INTRAVENOUS
  Filled 2023-06-29 (×3): qty 50

## 2023-06-29 MED ORDER — TRAZODONE HCL 50 MG PO TABS: 50.0000 mg | ORAL_TABLET | Freq: Every day | ORAL | Status: DC

## 2023-06-29 MED ORDER — SODIUM CHLORIDE 0.9 % IV SOLN
1.0000 g | Freq: Two times a day (BID) | INTRAVENOUS | Status: DC
  Administered 2023-06-29: 1 g via INTRAVENOUS
  Filled 2023-06-29: qty 20

## 2023-06-29 MED ORDER — ETOMIDATE 2 MG/ML IV SOLN
INTRAVENOUS | Status: AC
  Administered 2023-06-29: 10 mg via INTRAVENOUS
  Filled 2023-06-29: qty 20

## 2023-06-29 MED ORDER — SODIUM CHLORIDE 0.9% IV SOLUTION: Freq: Once | INTRAVENOUS | Status: DC

## 2023-06-29 MED ORDER — FENTANYL BOLUS VIA INFUSION
25.0000 ug | INTRAVENOUS | Status: DC | PRN
  Administered 2023-06-30: 25 ug via INTRAVENOUS

## 2023-06-29 MED ORDER — VANCOMYCIN VARIABLE DOSE PER UNSTABLE RENAL FUNCTION (PHARMACIST DOSING): Status: DC

## 2023-06-29 MED ORDER — FENTANYL 2500MCG IN NS 250ML (10MCG/ML) PREMIX INFUSION
25.0000 ug/h | INTRAVENOUS | Status: DC
  Administered 2023-06-30: 25 ug/h via INTRAVENOUS
  Filled 2023-06-29: qty 250

## 2023-06-29 MED ORDER — FUROSEMIDE 10 MG/ML IJ SOLN
60.0000 mg | Freq: Two times a day (BID) | INTRAMUSCULAR | Status: AC
  Administered 2023-06-29 (×2): 60 mg via INTRAVENOUS
  Filled 2023-06-29 (×2): qty 6

## 2023-06-29 MED ORDER — PROPOFOL 1000 MG/100ML IV EMUL
0.0000 ug/kg/min | INTRAVENOUS | Status: DC
  Administered 2023-06-30: 10 ug/kg/min via INTRAVENOUS
  Administered 2023-06-30: 5 ug/kg/min via INTRAVENOUS
  Filled 2023-06-29 (×2): qty 100

## 2023-06-29 MED ORDER — VASOPRESSIN 20 UNITS/100 ML INFUSION FOR SHOCK
0.0000 [IU]/min | INTRAVENOUS | Status: DC
  Administered 2023-06-30 (×3): 0.04 [IU]/min via INTRAVENOUS
  Filled 2023-06-29 (×2): qty 100
  Filled 2023-06-29: qty 200

## 2023-06-29 MED ORDER — MILRINONE LACTATE IN DEXTROSE 20-5 MG/100ML-% IV SOLN
0.3750 ug/kg/min | INTRAVENOUS | Status: DC
  Administered 2023-06-30 (×2): 0.375 ug/kg/min via INTRAVENOUS
  Filled 2023-06-29: qty 100

## 2023-06-29 MED ORDER — SODIUM BICARBONATE 8.4 % IV SOLN
100.0000 meq | Freq: Once | INTRAVENOUS | Status: AC
  Administered 2023-06-29: 100 meq via INTRAVENOUS

## 2023-06-29 MED ORDER — SODIUM BICARBONATE 8.4 % IV SOLN
50.0000 meq | Freq: Once | INTRAVENOUS | Status: AC
Start: 2023-06-29 — End: 2023-06-29
  Administered 2023-06-29: 50 meq via INTRAVENOUS

## 2023-06-29 MED ORDER — INSULIN GLARGINE-YFGN 100 UNIT/ML ~~LOC~~ SOLN
30.0000 [IU] | Freq: Two times a day (BID) | SUBCUTANEOUS | Status: DC
Start: 2023-06-29 — End: 2023-06-29
  Administered 2023-06-29: 30 [IU] via SUBCUTANEOUS
  Filled 2023-06-29 (×2): qty 0.3

## 2023-06-29 MED ORDER — VANCOMYCIN HCL 750 MG/150ML IV SOLN
750.0000 mg | INTRAVENOUS | Status: DC
  Administered 2023-06-29 – 2023-06-30 (×2): 750 mg via INTRAVENOUS
  Filled 2023-06-29 (×2): qty 150

## 2023-06-29 MED ORDER — INSULIN ASPART 100 UNIT/ML IJ SOLN
0.0000 [IU] | INTRAMUSCULAR | Status: DC
  Administered 2023-06-29: 8 [IU] via SUBCUTANEOUS
  Administered 2023-06-29: 2 [IU] via SUBCUTANEOUS

## 2023-06-29 NOTE — Procedures (Signed)
 Extubation Procedure Note  Patient Details:   Name: Karla Foster DOB: 1955-08-25 MRN: [id number]   Airway Documentation:    Vent end date: 06/29/23 Vent end time: 0927   Evaluation  O2 sats: stable throughout Complications: No apparent complications Patient did tolerate procedure well. Bilateral Breath Sounds: Diminished, Rhonchi   Yes  NIFM >-30 VC 2300  Pt extubated w/o complication to 4 lpm [place]. + cuff leak noted. No stridor noted post extubation.   Claudio Culver 06/29/2023, 9:31 AM

## 2023-06-29 NOTE — Progress Notes (Addendum)
 Patient ID: Karla Foster, female   DOB: 1955/06/30, 68 y.o.   MRN: [id number]  Called to see patient tonight due to rise in pressor requirement throughout the day and minimal UOP despite IV Lasix , UOP only 160 cc for the day.  She is now on epinephrine  10, NE 20, milrinone  0.25.  Co-ox 89% but ?accuracy, unable to get accurate cardiac output off the [place]. CVP markedly high at 26-27 on my read with PA systolic pressure up to 60s.   I did a bedside echo, LV systolic function was vigorous with LV EF 60-65%, the RV looked moderately dilated with mildly decreased systolic function, there is moderate to severe tricuspid regurgitation.  The bioprosthetic aortic and mitral valves look stable, there is only a small pericardial effusion. .  Afebrile, WBCs 19.8 earlier.   With minimal UOP, high pressor requirement, and markedly elevated CVP, it looks like she has significant RV failure.  I consulted CCM, recommend placement of HD catheter and initiation of CVVH.  I added vasopressin.  I will also send blood cultures and cover empirically for sepsis with vancomycin /meropenem given worsening shock. Send stat CBC and BMET as well as lactate.   Discussed with Dr. Luna Salinas, CCM, and nursing staff.   CRITICAL CARE Performed by: Peder Bourdon  Total critical care time: 60 minutes  Critical care time was exclusive of separately billable procedures and treating other patients.  Critical care was necessary to treat or prevent imminent or life-threatening deterioration.  Critical care was time spent personally by me on the following activities: development of treatment plan with patient and/or surrogate as well as nursing, discussions with consultants, evaluation of patient's response to treatment, examination of patient, obtaining history from patient or surrogate, ordering and performing treatments and interventions, ordering and review of laboratory studies, ordering and review of radiographic studies,  pulse oximetry and re-evaluation of patient's condition.  Peder Bourdon 06/29/2023 10:00 PM
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
## 2023-06-29 NOTE — Progress Notes (Signed)
      [address]       [name] [address]             [phone]    POD # 1  Alert, no complaints, in good spirits  BP (!) 79/56   Pulse 80   Temp (!) 97.3 F (36.3 C)   Resp 14   Ht 5\' 7"  (1.702 m)   Wt 75.3 kg   SpO2 (!) 88%   BMI 26.00 kg/m  50/21  CI 2.1  Epi 9, milrinone  0.25, norepi 22  Intake/Output Summary (Last 24 hours) at 06/29/2023 1710 Last data filed at 06/29/2023 1200 Gross per 24 hour  Intake 4268.92 ml  Output 905 ml  Net 3363.92 ml     PM labs pending  Doing well POD # 1  Karla Greggs C. Luna Salinas, MD [facility] [phone]

## 2023-06-29 NOTE — Procedures (Signed)
 Central Venous Catheter Insertion Procedure Note  Karla Foster  [id number]  [birth year]  Date:06/29/23  Time:11:14 PM   Provider Performing:Soniya Ashraf R Deondrae Mcgrail   Procedure: Insertion of Non-tunneled Central Venous [phone]) with US  guidance ([address]213)   Indication(s) Hemodialysis  Consent Risks of the procedure as well as the alternatives and risks of each were explained to the patient and/or caregiver.  Consent for the procedure was obtained and is signed in the bedside chart  Anesthesia Topical only with 1% lidocaine    Timeout Verified patient identification, verified procedure, site/side was marked, verified correct patient position, special equipment/implants available, medications/allergies/relevant history reviewed, required imaging and test results available.  Sterile Technique Maximal sterile technique including full sterile barrier drape, hand hygiene, sterile gown, sterile gloves, mask, hair covering, sterile ultrasound probe cover (if used).  Procedure Description Area of catheter insertion was cleaned with chlorhexidine  and draped in sterile fashion.  With real-time ultrasound guidance a HD catheter was placed into the right femoral vein. Nonpulsatile blood flow and easy flushing noted in all ports.  The catheter was sutured in place and sterile dressing applied.  Complications/Tolerance None; patient tolerated the procedure well. Chest X-ray is ordered to verify placement for internal jugular or subclavian cannulation.   Chest x-ray is not ordered for femoral cannulation.  EBL Minimal  Specimen(s) None  Patt Boozer Tacha Manni, PA-C

## 2023-06-29 NOTE — Progress Notes (Signed)
      [address]       Karla Foster [address]             [phone]      CTSP for worsening pressor requirements  BP (!) 88/72   Pulse 79   Temp 97.9 F (36.6 C)   Resp 14   Ht 5\' 7"  (1.702 m)   Wt 75.3 kg   SpO2 92%   BMI 26.00 kg/m    Intake/Output Summary (Last 24 hours) at 06/29/2023 2149 Last data filed at 06/29/2023 2039 Gross per 24 hour  Intake 4504.98 ml  Output 859 ml  Net 3645.98 ml   Has had slowly increasing pressor requirements through the day.  Around 8 PM had more rapid deterioration. Up to 35 on norepi and 20 on epi. 101/72  PA up to 62/28, CVP 25 Examined at bedside with Dr. Mitzie Anda Bedside echo showed good LV function, dilated RV.  No tamponade Minimal UO  Start vasopressin, increase milrinone  to 0.375 Bicarb to correct acidosis Check co-ox (CO notworking) CBC, BMET Consult CCM- may need CRRT urgently  Landon Pinion C. Luna Salinas, MD [facility] [phone]

## 2023-06-29 NOTE — Procedures (Signed)
 Intubation Procedure Note  Airalynn Saxena  [id number]  Sep 11, 1955  Date:06/29/23  Time:11:26 PM   Provider Performing:Lorry Anastasi Mason Sole    Procedure: Intubation ([address]00)  Indication(s) Respiratory Failure, hypoxic on 100% NRM, SOB  Consent Risks of the procedure as well as the alternatives and risks of each were explained to the patient and/or caregiver.  Consent for the procedure was obtained and is signed in the bedside chart   Anesthesia Etomidate, Versed , and Rocuronium    Time Out Verified patient identification, verified procedure, site/side was marked, verified correct patient position, special equipment/implants available, medications/allergies/relevant history reviewed, required imaging and test results available.   Sterile Technique Usual hand hygeine, masks, and gloves were used   Procedure Description Patient positioned in bed supine.  Sedation given as noted above.  Patient was intubated with endotracheal tube using Glidescope.  View was Grade 1 full glottis .  Number of attempts was 1.  Colorimetric CO2 detector was consistent with tracheal placement.   Complications/Tolerance None; patient tolerated the procedure well. Chest X-ray is ordered to verify placement.   EBL 0   Specimen(s) None

## 2023-06-29 NOTE — Progress Notes (Signed)
 1 Day Post-Op Procedure(s) (LRB): AORTIC VALVE REPLACEMENT USING 21 MM INSPIRIS AORTIC VALVE ([name]) MITRAL VALVE REPLACEMENT USING 27 MM MEDTRONIC MOSAIC BIOPROSTHESIS MITRAL VALVE ([name]) COX MAZE PROCEDURE ([name]) ECHOCARDIOGRAM, TRANSESOPHAGEAL, INTRAOPERATIVE ([name]) CLIPPING, LEFT ATRIAL APPENDAGE USING 40 MM ATRICURE ATRICLIP Subjective: Just extubated, reportedly a little confused earlier but appropriate at present  Objective: Vital signs in last 24 hours: Temp:  [97 F (36.1 C)-98.4 F (36.9 C)] 97.7 F (36.5 C) (05/10 0900) Pulse Rate:  [78-82] 80 (05/10 0900) Cardiac Rhythm: Atrial paced (05/10 0745) Resp:  [9-28] 16 (05/10 0900) BP: (63-94)/(48-63) 79/56 (05/09 1945) SpO2:  [92 %-100 %] 95 % (05/10 0930) FiO2 (%):  [40 %-50 %] 40 % (05/10 0747)  Hemodynamic parameters for last 24 hours: PAP: (33-47)/(13-23) 44/16 CVP:  [9 mmHg-16 mmHg] 12 mmHg PCWP:  [15 mmHg-18 mmHg] 15 mmHg CO:  [2.7 L/min-3.7 L/min] 2.8 L/min CI:  [1.5 L/min/m2-2 L/min/m2] 1.5 L/min/m2  Intake/Output from previous day: 05/09 0701 - 05/10 0700 In: 9817.5 [I.V.:5093.6; Blood:2979; IV Piggyback:1744.9] Out: 1860 [Urine:1610; Chest Tube:250] Intake/Output this shift: Total I/O In: 311.7 [I.V.:242.5; IV Piggyback:69.2] Out: 80 [Urine:50; Chest Tube:30]  General appearance: alert, cooperative, and no distress Neurologic: intact Heart: regular rate and rhythm Lungs: diminished breath sounds bibasilar Abdomen: normal findings: soft, non-tender  Lab Results: Recent Labs    06/28/23 1814 06/28/23 2347 06/29/23 0412 06/29/23 0630 06/29/23 0924  WBC 18.5*  --  19.7*  --   --   HGB 8.8*  10.1*   < > 9.7* 8.5* 8.5*  HCT 26.0*  29.3*   < > 28.8* 25.0* 25.0*  PLT 104*  --  132*  --   --    < > = values in this interval not displayed.   BMET:  Recent Labs    06/28/23 0425 06/28/23 0742 06/28/23 1730 06/28/23 1814 06/29/23 0412 06/29/23 0630 06/29/23 0924  NA 136   < > 139   < > 136 138 137   K 3.2*   < > 3.4*   < > 3.5 3.2* 3.5  CL 89*   < > 103  --  106  --   --   CO2 34*  --   --   --  20*  --   --   GLUCOSE 115*   < > 134*  --  159*  --   --   BUN 15   < > 12  --  16  --   --   CREATININE 1.07*   < > 0.80  --  1.35*  --   --   CALCIUM  8.9  --   --   --  7.7*  --   --    < > = values in this interval not displayed.    PT/INR:  Recent Labs    06/28/23 1814  LABPROT 14.2  INR 1.1   ABG    Component Value Date/Time   PHART 7.385 06/29/2023 0924   HCO3 20.3 06/29/2023 0924   TCO2 21 (L) 06/29/2023 0924   ACIDBASEDEF 4.0 (H) 06/29/2023 0924   O2SAT 98 06/29/2023 0924   CBG (last 3)  Recent Labs    06/29/23 0730 06/29/23 0830 06/29/23 0834  GLUCAP 184* 89 153*    Assessment/Plan: S/P Procedure(s) (LRB): AORTIC VALVE REPLACEMENT USING 21 MM INSPIRIS AORTIC VALVE ([name]) MITRAL VALVE REPLACEMENT USING 27 MM MEDTRONIC MOSAIC BIOPROSTHESIS MITRAL VALVE ([name]) COX MAZE PROCEDURE ([name]) ECHOCARDIOGRAM, TRANSESOPHAGEAL, INTRAOPERATIVE ([name]) CLIPPING, LEFT ATRIAL APPENDAGE USING 40 MM  ATRICURE ATRICLIP POD # 1 NEURO- some confusion earlier, monitor CV- in junctional rhythm, paced at 80 bpm  CI 2.1 on milrinone  0.125, epi 7, norepi 14  Will wait another 24-48 hours before starting warfarin RESP- just extubated  IS, pulmonary hygiene RENAL- creatinine up slightly at 1.35  Will need diuresis when BP allows ENDO- CBG variable, on insulin  drip  Transition to Semglee + SSI GI- advance diet as tolerated Anemia- Hct 25, monitor Keep CT today OOB to chair   LOS: 5 days    Karla Foster 06/29/2023

## 2023-06-29 NOTE — Progress Notes (Signed)
 Patient ID: Karla Foster, female   DOB: 09-07-1955, 68 y.o.   MRN: [id number]     Advanced Heart Failure Rounding Note  Cardiologist: Hazle Lites, MD  Chief Complaint: CHF Subjective:    - LHC/RHC (5/6):  1. Normal coronaries, nonischemic cardiomyopathy.  2. Elevated PCWP though LVEDP was not significantly elevated (discrepancy may be related to mitral regurgitation).  3. Borderline elevated right-sided filling pressures.  4. Low CI at 1.9 5. Mild pulmonary venous hypertension.  - 5/7 TEE/DC-CV --> SR - 5/9 Surgery with bioprosthetic AoV and MV, Maze, LA appendage clipping.   She was extubated this morning, mild confusion.    She is a-paced rate 80s.   She is on epinephrine  7, NE 14, milrinone  0.125.  Creatinine mildly higher at 1.35. WBCs 19.7, hgb 9.7.   Swan: RA 15-16 PA 44/16 Unable to obtain accurate thermo CO reading  Objective:   Weight Range: 75.3 kg Body mass index is 26 kg/m.   Vital Signs:   Temp:  [97 F (36.1 C)-98.4 F (36.9 C)] 97.7 F (36.5 C) (05/10 0900) Pulse Rate:  [78-82] 80 (05/10 0900) Resp:  [9-28] 16 (05/10 0900) BP: (63-94)/(48-63) 79/56 (05/09 1945) SpO2:  [92 %-100 %] 95 % (05/10 0930) FiO2 (%):  [40 %-50 %] 40 % (05/10 0747) Last BM Date : 06/26/23  Weight change: Filed Weights   06/25/23 0600 06/27/23 0630 06/28/23 0645  Weight: 72.9 kg 75.3 kg 75.3 kg    Intake/Output:   Intake/Output Summary (Last 24 hours) at 06/29/2023 0946 Last data filed at 06/29/2023 0900 Gross per 24 hour  Intake 9379.23 ml  Output 1830 ml  Net 7549.23 ml    CVP 15-16   Physical Exam    General: NAD Neck: 14-16 cm, no thyromegaly or thyroid  nodule.  Lungs: Clear to auscultation bilaterally with normal respiratory effort. CV: Nondisplaced PMI.  Heart regular S1/S2, no S3/S4, no murmur. 1+ ankle edema.  Abdomen: Soft, nontender, no hepatosplenomegaly, no distention.  Skin: Intact without lesions or rashes.  Neurologic: Alert and  oriented to place, mild confusion.   Psych: Normal affect. Extremities: No clubbing or cyanosis.  HEENT: Normal.   Telemetry   A-paced in 80s (personally reviewed)  Labs    CBC Recent Labs    06/28/23 1814 06/28/23 2347 06/29/23 0412 06/29/23 0630 06/29/23 0924  WBC 18.5*  --  19.7*  --   --   HGB 8.8*  10.1*   < > 9.7* 8.5* 8.5*  HCT 26.0*  29.3*   < > 28.8* 25.0* 25.0*  MCV 91.0  --  91.7  --   --   PLT 104*  --  132*  --   --    < > = values in this interval not displayed.   Basic Metabolic Panel Recent Labs    36/64/40 0501 06/28/23 0425 06/28/23 0742 06/28/23 1730 06/28/23 1814 06/29/23 0412 06/29/23 0630 06/29/23 0924  NA 135 136   < > 139   < > 136 138 137  K 4.1 3.2*   < > 3.4*   < > 3.5 3.2* 3.5  CL 95* 89*   < > 103  --  106  --   --   CO2 32 34*  --   --   --  20*  --   --   GLUCOSE 141* 115*   < > 134*  --  159*  --   --   BUN 9 15   < >  12  --  16  --   --   CREATININE 1.13* 1.07*   < > 0.80  --  1.35*  --   --   CALCIUM  8.3* 8.9  --   --   --  7.7*  --   --   MG 1.7  --   --   --   --  3.5*  --   --    < > = values in this interval not displayed.   Liver Function Tests No results for input(s): "AST", "ALT", "ALKPHOS", "BILITOT", "PROT", "ALBUMIN " in the last 72 hours.  No results for input(s): "LIPASE", "AMYLASE" in the last 72 hours. Cardiac Enzymes No results for input(s): "CKTOTAL", "CKMB", "CKMBINDEX", "TROPONINI" in the last 72 hours.  BNP: BNP (last 3 results) Recent Labs    06/24/23 1053  BNP 771.9*    ProBNP (last 3 results) No results for input(s): "PROBNP" in the last 8760 hours.   D-Dimer No results for input(s): "DDIMER" in the last 72 hours. Hemoglobin A1C No results for input(s): "HGBA1C" in the last 72 hours.  Fasting Lipid Panel No results for input(s): "CHOL", "HDL", "LDLCALC", "TRIG", "CHOLHDL", "LDLDIRECT" in the last 72 hours. Thyroid  Function Tests No results for input(s): "TSH", "T4TOTAL", "T3FREE",  "THYROIDAB" in the last 72 hours.  Invalid input(s): "FREET3"   Other results:   Imaging    DG Chest Port 1 View Result Date: 06/28/2023 CLINICAL DATA:  [id number] S/P aortic valve replacement with bioprosthetic valve [id number] [phone] S/P MVR (mitral valve replacement) [id number] EXAM: PORTABLE CHEST 1 VIEW COMPARISON:  Chest x-ray 06/24/2023 FINDINGS: Endotracheal tube terminates 3.2 cm above the carina. Enteric tube courses below the hemidiaphragm with tip and side port overlying the expected region the gastric lumen. Right internal jugular approach Swan-Ganz catheter with tip overlying the main pulmonary artery. Poorly visualized known left PICC with tip overlying the right atrium. Likely mediastinal drain versus left chest tube overlying left upper quadrant. The heart and mediastinal contours are unchanged. Interval aortic valve replacement. Interval left atrial appendage clip. Mild retrocardiac airspace opacity. Mild pulmonary edema. Likely trace left pleural effusion. No pneumothorax. No acute osseous abnormality.  Interval sternotomy wires. IMPRESSION: 1. Pulmonary edema. 2. Mild retrocardiac airspace opacity. 3. Likely trace left pleural effusion. 4. Lines and tubes as above. 5. Interval aortic valve replacement. Interval left atrial appendage clip. Electronically Signed   By: Morgane  Naveau M.D.   On: 06/28/2023 20:09     Medications:     Scheduled Medications:  acetaminophen   1,000 mg Oral Q6H   Or   acetaminophen  (TYLENOL ) oral liquid 160 mg/5 mL  1,000 mg Per Tube Q6H   amiodarone   400 mg Oral BID   aspirin  EC  325 mg Oral Daily   Or   aspirin   324 mg Per Tube Daily   atorvastatin   40 mg Oral Daily   bisacodyl   10 mg Oral Daily   Or   bisacodyl   10 mg Rectal Daily   Chlorhexidine  Gluconate Cloth  6 each Topical Daily   docusate sodium   200 mg Oral Daily   metoCLOPramide (REGLAN) injection  10 mg Intravenous Q6H   [START ON 06/30/2023] pantoprazole   40 mg Oral Daily   pantoprazole   (PROTONIX ) IV  40 mg Intravenous QHS   sodium chloride  flush  3 mL Intravenous Q12H   sodium chloride  flush  3 mL Intravenous Q12H    Infusions:  sodium chloride  Stopped (06/28/23 2057)   sodium chloride  10 mL/hr at  06/29/23 0900    ceFAZolin  (ANCEF ) IV Stopped (06/29/23 0617)   dexmedetomidine  (PRECEDEX ) IV infusion Stopped (06/29/23 0857)   epinephrine  7 mcg/min (06/29/23 0900)   insulin  3 Units/hr (06/29/23 0900)   lactated ringers  Stopped (06/28/23 2000)   lactated ringers  20 mL/hr at 06/29/23 0900   milrinone  0.125 mcg/kg/min (06/29/23 0900)   norepinephrine  (LEVOPHED ) Adult infusion 14 mcg/min (06/29/23 0900)   potassium chloride  10 mEq (06/29/23 0901)    PRN Medications: sodium chloride , acetaminophen , dextrose , metoprolol  tartrate, midazolam , morphine  injection, ondansetron  (ZOFRAN ) IV, oxyCODONE , sodium chloride  flush, sodium chloride  flush, traMADol     Assessment/Plan   1. Atrial fibrillation: With RVR initially.  5/7 TEE-guided DCCV today.  She is now s/p Maze with LAA clipping at time of valve surgery on 5/9. A-paced today.  - Eventual anticoagulation.  2. Acute systolic CHF/cardiogenic shock:  Remote echo showed normal EF.  Echo this admission showed EF 30-35%, moderate RV dysfunction, PASP 65, severe MR, moderate TR, moderate-severe AI, IVC dilated.   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  After DCCV, echo images showed improvement in EF from around 35% in AF/RVR to about 50% in NSR. Now s/p bioprosthetic AVR/MVR and Maze on 5/9.  She is now on epinephrine  7, NE 14, milrinone  0.125. CVP 15-16, unable to get adequate thermodilution CO.  Creatinine mildly higher at 1.35.  - Send co-ox.  - Will keep milrinone  at 0.25 for now.  - Wean epinephrine /NE as tolerated.  3. Valvular heart disease: TEE showed rheumatic heart disease with moderate-severe AI, moderate TR, severe mitral regurgitation with probably mild mitral stenosis.  Now s/p bioprosthetic AVR and MVR with  Maze on 5/9.  4. Chronic low back pain: History of spinal stenosis.  - Restart home gabapentin  and Klonopin .  5. Anemia: Post-op, transfuse hgb < 8.  6. Delirium: Mild post-op delirium. Follow and re-orient.   CRITICAL CARE Performed by: Peder Bourdon  Total critical care time: 40 minutes  Critical care time was exclusive of separately billable procedures and treating other patients.  Critical care was necessary to treat or prevent imminent or life-threatening deterioration.  Critical care was time spent personally by me on the following activities: development of treatment plan with patient and/or surrogate as well as nursing, discussions with consultants, evaluation of patient's response to treatment, examination of patient, obtaining history from patient or surrogate, ordering and performing treatments and interventions, ordering and review of laboratory studies, ordering and review of radiographic studies, pulse oximetry and re-evaluation of patient's condition.   Peder Bourdon 06/29/2023 9:46 AM

## 2023-06-29 NOTE — Anesthesia Postprocedure Evaluation (Signed)
 Anesthesia Post Note  Patient: Karla Foster  Procedure(s) Performed: AORTIC VALVE REPLACEMENT USING 21 MM INSPIRIS AORTIC VALVE (Chest) MITRAL VALVE REPLACEMENT USING 27 MM MEDTRONIC MOSAIC BIOPROSTHESIS MITRAL VALVE (Chest) COX MAZE PROCEDURE (Chest) ECHOCARDIOGRAM, TRANSESOPHAGEAL, INTRAOPERATIVE CLIPPING, LEFT ATRIAL APPENDAGE USING 40 MM ATRICURE ATRICLIP     Patient location during evaluation: SICU Anesthesia Type: General Level of consciousness: awake and alert, patient cooperative and oriented Pain management: pain level controlled Respiratory status: spontaneous breathing, respiratory function stable, nonlabored ventilation and patient connected to nasal cannula oxygen  (extubated this am) Cardiovascular status: stable Postop Assessment: adequate PO intake and no apparent nausea or vomiting Anesthetic complications: no Comments: Pt with increased inotropic and pressor support now that extubated and comfortable. Albumin  infusing as well. Chest tube output lessening.  She says she feels well, eating presently.  No apparent anesthetic complications    Last Vitals:  Vitals:   06/29/23 1045 06/29/23 1100  BP:    Pulse: 79 80  Resp: 16 14  Temp: 36.5 C (!) 36.3 C  SpO2: (!) 88% (!) 88%    Last Pain:  Vitals:   06/28/23 2000  TempSrc: Bladder  PainSc:                  [name],E. Alajah Witman

## 2023-06-29 NOTE — Plan of Care (Signed)
 Pt unable to be weaned from the ventilator overnight, extremely anxious and agitated. When asked, pt has no complaints pain, just frustration with the ETT. Follows commands but orientation status is questionable. Pressor requirements increased overnight to maintain a systolic of 90 mmHg. UOP 25-35 mL/hr.

## 2023-06-29 NOTE — Progress Notes (Addendum)
 Pharmacy Antibiotic Note  Karla Foster is a 68 y.o. female admitted on 06/24/2023 afib RVR and is now s/p bAVR/bMVR, COX MAZE, and LAA clipping procedure on 5/9.  Pharmacy has been consulted for merrem/vancomycin  dosing for sepsis  -CXR: increasing left retrocardiac opacity; vasc congestion -Cefazolin  for surgical prophylaxis; received vancomycin  1g IV on 5/9 -WBC 19, sCr 1.63 (bl ~0.8) -repeat blood cultures ordered; 5/5 BCX: NGTD -MRSA PCR: not detected -Bedside ECHO: LVEF 60-65%, RV dilated, mod-severe tricuspid regurgitation, valves stable, small pericardial effusion  Plan: -Merrem 1g IV every 12 hours -Vancomycin  750mg  IV x1 -Follow up placement of dialysis cath and dose vancomycin  and merrem per CRRT -Vanc variable dosing -Monitor renal function -Follow up signs of clinical improvement, LOT, de-escalation of antibiotics   Height: 5\' 7"  (170.2 cm) Weight: 75.3 kg (166 lb 0.1 oz) IBW/kg (Calculated) : 61.6  Temp (24hrs), Avg:97.7 F (36.5 C), Min:97 F (36.1 C), Max:98.2 F (36.8 C)  Recent Labs  Lab 06/24/23 1557 06/24/23 1701 06/25/23 0515 06/28/23 0425 06/28/23 0742 06/28/23 1504 06/28/23 1617 06/28/23 1641 06/28/23 1730 06/28/23 1814 06/29/23 0412 06/29/23 1723  WBC  --   --    < > 13.5*  --   --   --  19.7*  --  18.5* 19.7* 19.8*  CREATININE  --   --    < > 1.07*   < > 0.70 0.80  --  0.80  --  1.35* 1.63*  LATICACIDVEN 1.3 1.7  --   --   --   --   --   --   --   --   --   --    < > = values in this interval not displayed.    Estimated Creatinine Clearance: 35 mL/min (A) (by C-G formula based on SCr of 1.63 mg/dL (H)).    Allergies  Allergen Reactions   Amoxicillin Other (See Comments)    Tongue swelling    Antimicrobials this admission: Cefazolin  x6 doses (4 given) Merrem 5/10 >>  Vancomycin  5/10 >>  Microbiology results: 5/5 BCx: NGTD 5/10 Bcx: ordered 5/5 MRSA PCR: not detected  Thank you for allowing pharmacy to be a part of this  patient's care.  Young Hensen, PharmD, BCPS Clinical Pharmacist 06/29/2023 9:59 PM   ADDENDUM -Initiating CRRT: adjusted antibiotic dosing   Plan: -Merrem 1g IV every 8 hours -Vancomycin  750mg  IV every 24 hours -Follow up vancomycin  levels at steady state -Follow up signs of clinical improvement, LOT, de-escalation of antibiotics   Matteus Mcnelly, PharmD, BCPS Clinical Pharmacist 06/29/2023 11:38 PM

## 2023-06-30 ENCOUNTER — Inpatient Hospital Stay (HOSPITAL_COMMUNITY)

## 2023-06-30 DIAGNOSIS — E871 Hypo-osmolality and hyponatremia: Secondary | ICD-10-CM

## 2023-06-30 DIAGNOSIS — J9601 Acute respiratory failure with hypoxia: Secondary | ICD-10-CM | POA: Diagnosis not present

## 2023-06-30 DIAGNOSIS — D598 Other acquired hemolytic anemias: Secondary | ICD-10-CM

## 2023-06-30 DIAGNOSIS — J810 Acute pulmonary edema: Secondary | ICD-10-CM | POA: Diagnosis not present

## 2023-06-30 DIAGNOSIS — R6521 Severe sepsis with septic shock: Secondary | ICD-10-CM | POA: Diagnosis not present

## 2023-06-30 DIAGNOSIS — I5021 Acute systolic (congestive) heart failure: Secondary | ICD-10-CM | POA: Diagnosis not present

## 2023-06-30 DIAGNOSIS — I4891 Unspecified atrial fibrillation: Secondary | ICD-10-CM | POA: Diagnosis not present

## 2023-06-30 DIAGNOSIS — A419 Sepsis, unspecified organism: Secondary | ICD-10-CM

## 2023-06-30 LAB — COOXEMETRY PANEL
Carboxyhemoglobin: 1.6 % — ABNORMAL HIGH (ref 0.5–1.5)
Carboxyhemoglobin: 0.7 % (ref 0.5–1.5)
Carboxyhemoglobin: 3.4 % — ABNORMAL HIGH (ref 0.5–1.5)
Carboxyhemoglobin: 0.9 % (ref 0.5–1.5)
Methemoglobin: <0.7 % (ref 0.0–1.5)
Methemoglobin: <0.7 % (ref 0.0–1.5)
Methemoglobin: 2.2 % — ABNORMAL HIGH (ref 0.0–1.5)
Methemoglobin: <0.7 % (ref 0.0–1.5)
O2 Saturation: 66.9 %
O2 Saturation: 65.4 %
O2 Saturation: 92 %
O2 Saturation: 58.4 %
Total hemoglobin: 8.5 g/dL — ABNORMAL LOW (ref 12.0–16.0)
Total hemoglobin: 8.8 g/dL — ABNORMAL LOW (ref 12.0–16.0)
Total hemoglobin: 7.1 g/dL — ABNORMAL LOW (ref 12.0–16.0)
Total hemoglobin: 7.7 g/dL — ABNORMAL LOW (ref 12.0–16.0)

## 2023-06-30 LAB — POCT I-STAT 7, (LYTES, BLD GAS, ICA,H+H)
Acid-base deficit: 14 mmol/L — ABNORMAL HIGH (ref 0.0–2.0)
Acid-base deficit: 14 mmol/L — ABNORMAL HIGH (ref 0.0–2.0)
Acid-base deficit: 8 mmol/L — ABNORMAL HIGH (ref 0.0–2.0)
Acid-base deficit: 9 mmol/L — ABNORMAL HIGH (ref 0.0–2.0)
Bicarbonate: 11.4 mmol/L — ABNORMAL LOW (ref 20.0–28.0)
Bicarbonate: 13.2 mmol/L — ABNORMAL LOW (ref 20.0–28.0)
Bicarbonate: 17.7 mmol/L — ABNORMAL LOW (ref 20.0–28.0)
Bicarbonate: 19.3 mmol/L — ABNORMAL LOW (ref 20.0–28.0)
Calcium, Ion: 1.02 mmol/L — ABNORMAL LOW (ref 1.15–1.40)
Calcium, Ion: 1.03 mmol/L — ABNORMAL LOW (ref 1.15–1.40)
Calcium, Ion: 1.05 mmol/L — ABNORMAL LOW (ref 1.15–1.40)
Calcium, Ion: 1.06 mmol/L — ABNORMAL LOW (ref 1.15–1.40)
HCT: 16 % — ABNORMAL LOW (ref 36.0–46.0)
HCT: 22 % — ABNORMAL LOW (ref 36.0–46.0)
HCT: 23 % — ABNORMAL LOW (ref 36.0–46.0)
HCT: 25 % — ABNORMAL LOW (ref 36.0–46.0)
Hemoglobin: 5.4 g/dL — CL (ref 12.0–15.0)
Hemoglobin: 7.5 g/dL — ABNORMAL LOW (ref 12.0–15.0)
Hemoglobin: 7.8 g/dL — ABNORMAL LOW (ref 12.0–15.0)
Hemoglobin: 8.5 g/dL — ABNORMAL LOW (ref 12.0–15.0)
O2 Saturation: 100 %
O2 Saturation: 96 %
O2 Saturation: 98 %
O2 Saturation: 99 %
Patient temperature: 36.7
Patient temperature: 36.7
Patient temperature: 37
Patient temperature: 98.2
Potassium: 3.9 mmol/L (ref 3.5–5.1)
Potassium: 4.2 mmol/L (ref 3.5–5.1)
Potassium: 4.9 mmol/L (ref 3.5–5.1)
Potassium: 5 mmol/L (ref 3.5–5.1)
Sodium: 129 mmol/L — ABNORMAL LOW (ref 135–145)
Sodium: 129 mmol/L — ABNORMAL LOW (ref 135–145)
Sodium: 131 mmol/L — ABNORMAL LOW (ref 135–145)
Sodium: 132 mmol/L — ABNORMAL LOW (ref 135–145)
TCO2: 12 mmol/L — ABNORMAL LOW (ref 22–32)
TCO2: 14 mmol/L — ABNORMAL LOW (ref 22–32)
TCO2: 19 mmol/L — ABNORMAL LOW (ref 22–32)
TCO2: 21 mmol/L — ABNORMAL LOW (ref 22–32)
pCO2 arterial: 25 mmHg — ABNORMAL LOW (ref 32–48)
pCO2 arterial: 37.6 mmHg (ref 32–48)
pCO2 arterial: 40.6 mmHg (ref 32–48)
pCO2 arterial: 45.4 mmHg (ref 32–48)
pH, Arterial: 7.152 — CL (ref 7.35–7.45)
pH, Arterial: 7.235 — ABNORMAL LOW (ref 7.35–7.45)
pH, Arterial: 7.246 — ABNORMAL LOW (ref 7.35–7.45)
pH, Arterial: 7.265 — ABNORMAL LOW (ref 7.35–7.45)
pO2, Arterial: 130 mmHg — ABNORMAL HIGH (ref 83–108)
pO2, Arterial: 150 mmHg — ABNORMAL HIGH (ref 83–108)
pO2, Arterial: 310 mmHg — ABNORMAL HIGH (ref 83–108)
pO2, Arterial: 92 mmHg (ref 83–108)

## 2023-06-30 LAB — RENAL FUNCTION PANEL
Albumin: 2.4 g/dL — ABNORMAL LOW (ref 3.5–5.0)
Albumin: 1.7 g/dL — ABNORMAL LOW (ref 3.5–5.0)
Anion gap: 18 — ABNORMAL HIGH (ref 5–15)
Anion gap: 19 — ABNORMAL HIGH (ref 5–15)
BUN: 15 mg/dL (ref 8–23)
BUN: 11 mg/dL (ref 8–23)
CO2: 16 mmol/L — ABNORMAL LOW (ref 22–32)
CO2: 20 mmol/L — ABNORMAL LOW (ref 22–32)
Calcium: 7 mg/dL — ABNORMAL LOW (ref 8.9–10.3)
Calcium: 13.3 mg/dL (ref 8.9–10.3)
Chloride: 92 mmol/L — ABNORMAL LOW (ref 98–111)
Chloride: 99 mmol/L (ref 98–111)
Creatinine, Ser: 1.39 mg/dL — ABNORMAL HIGH (ref 0.44–1.00)
Creatinine, Ser: 1.36 mg/dL — ABNORMAL HIGH (ref 0.44–1.00)
GFR, Estimated: 41 mL/min — ABNORMAL LOW (ref >60)
GFR, Estimated: 42 mL/min — ABNORMAL LOW (ref >60)
Glucose, Bld: 241 mg/dL — ABNORMAL HIGH (ref 70–99)
Glucose, Bld: 114 mg/dL — ABNORMAL HIGH (ref 70–99)
Phosphorus: 5.9 mg/dL — ABNORMAL HIGH (ref 2.5–4.6)
Phosphorus: 6.3 mg/dL — ABNORMAL HIGH (ref 2.5–4.6)
Potassium: 4.2 mmol/L (ref 3.5–5.1)
Potassium: 4.8 mmol/L (ref 3.5–5.1)
Sodium: 126 mmol/L — ABNORMAL LOW (ref 135–145)
Sodium: 138 mmol/L (ref 135–145)

## 2023-06-30 LAB — COMPREHENSIVE METABOLIC PANEL WITH GFR
ALT: 732 U/L — ABNORMAL HIGH (ref 0–44)
AST: 3944 U/L — ABNORMAL HIGH (ref 15–41)
Albumin: 2.3 g/dL — ABNORMAL LOW (ref 3.5–5.0)
Alkaline Phosphatase: 163 U/L — ABNORMAL HIGH (ref 38–126)
Anion gap: 20 — ABNORMAL HIGH (ref 5–15)
BUN: 10 mg/dL (ref 8–23)
CO2: 16 mmol/L — ABNORMAL LOW (ref 22–32)
Calcium: 9 mg/dL (ref 8.9–10.3)
Chloride: 98 mmol/L (ref 98–111)
Creatinine, Ser: 1.27 mg/dL — ABNORMAL HIGH (ref 0.44–1.00)
GFR, Estimated: 46 mL/min — ABNORMAL LOW (ref >60)
Glucose, Bld: 126 mg/dL — ABNORMAL HIGH (ref 70–99)
Potassium: 5.6 mmol/L — ABNORMAL HIGH (ref 3.5–5.1)
Sodium: 134 mmol/L — ABNORMAL LOW (ref 135–145)
Total Bilirubin: 2.7 mg/dL — ABNORMAL HIGH (ref 0.0–1.2)
Total Protein: 4.2 g/dL — ABNORMAL LOW (ref 6.5–8.1)

## 2023-06-30 LAB — PREPARE RBC (CROSSMATCH)

## 2023-06-30 LAB — GLUCOSE, CAPILLARY
Glucose-Capillary: 114 mg/dL — ABNORMAL HIGH (ref 70–99)
Glucose-Capillary: 117 mg/dL — ABNORMAL HIGH (ref 70–99)
Glucose-Capillary: 118 mg/dL — ABNORMAL HIGH (ref 70–99)
Glucose-Capillary: 123 mg/dL — ABNORMAL HIGH (ref 70–99)
Glucose-Capillary: 125 mg/dL — ABNORMAL HIGH (ref 70–99)
Glucose-Capillary: 133 mg/dL — ABNORMAL HIGH (ref 70–99)
Glucose-Capillary: 137 mg/dL — ABNORMAL HIGH (ref 70–99)
Glucose-Capillary: 148 mg/dL — ABNORMAL HIGH (ref 70–99)
Glucose-Capillary: 159 mg/dL — ABNORMAL HIGH (ref 70–99)
Glucose-Capillary: 199 mg/dL — ABNORMAL HIGH (ref 70–99)
Glucose-Capillary: 50 mg/dL — ABNORMAL LOW (ref 70–99)
Glucose-Capillary: 51 mg/dL — ABNORMAL LOW (ref 70–99)
Glucose-Capillary: 64 mg/dL — ABNORMAL LOW (ref 70–99)
Glucose-Capillary: 67 mg/dL — ABNORMAL LOW (ref 70–99)
Glucose-Capillary: 82 mg/dL (ref 70–99)

## 2023-06-30 LAB — ECHOCARDIOGRAM COMPLETE
Height: 67 in
S' Lateral: 3.9 cm
Weight: 3188.73 [oz_av]

## 2023-06-30 LAB — DIC (DISSEMINATED INTRAVASCULAR COAGULATION)PANEL
D-Dimer, Quant: 6.88 ug{FEU}/mL — ABNORMAL HIGH (ref 0.00–0.50)
D-Dimer, Quant: 11.25 ug{FEU}/mL — ABNORMAL HIGH (ref 0.00–0.50)
Fibrinogen: 175 mg/dL — ABNORMAL LOW (ref 210–475)
Fibrinogen: 215 mg/dL (ref 210–475)
INR: 6.8 (ref 0.8–1.2)
INR: 3 — ABNORMAL HIGH (ref 0.8–1.2)
Platelets: 35 10*3/uL — ABNORMAL LOW (ref 150–400)
Platelets: 63 10*3/uL — ABNORMAL LOW (ref 150–400)
Prothrombin Time: 59.5 s — ABNORMAL HIGH (ref 11.4–15.2)
Prothrombin Time: 31 s — ABNORMAL HIGH (ref 11.4–15.2)
Smear Review: NONE SEEN
Smear Review: NONE SEEN
aPTT: 76 s — ABNORMAL HIGH (ref 24–36)
aPTT: 56 s — ABNORMAL HIGH (ref 24–36)

## 2023-06-30 LAB — BASIC METABOLIC PANEL WITH GFR
Anion gap: 14 (ref 5–15)
BUN: 12 mg/dL (ref 8–23)
CO2: 22 mmol/L (ref 22–32)
Calcium: 11.9 mg/dL — ABNORMAL HIGH (ref 8.9–10.3)
Chloride: 101 mmol/L (ref 98–111)
Creatinine, Ser: 1.37 mg/dL — ABNORMAL HIGH (ref 0.44–1.00)
GFR, Estimated: 42 mL/min — ABNORMAL LOW (ref >60)
Glucose, Bld: 117 mg/dL — ABNORMAL HIGH (ref 70–99)
Potassium: 4.7 mmol/L (ref 3.5–5.1)
Sodium: 137 mmol/L (ref 135–145)

## 2023-06-30 LAB — LACTIC ACID, PLASMA
Lactic Acid, Venous: 7.1 mmol/L (ref 0.5–1.9)
Lactic Acid, Venous: 8.9 mmol/L (ref 0.5–1.9)
Lactic Acid, Venous: >9 mmol/L (ref 0.5–1.9)
Lactic Acid, Venous: >9 mmol/L (ref 0.5–1.9)

## 2023-06-30 LAB — CBC
HCT: 25 % — ABNORMAL LOW (ref 36.0–46.0)
HCT: 21 % — ABNORMAL LOW (ref 36.0–46.0)
HCT: 23.7 % — ABNORMAL LOW (ref 36.0–46.0)
Hemoglobin: 8.2 g/dL — ABNORMAL LOW (ref 12.0–15.0)
Hemoglobin: 6.9 g/dL — CL (ref 12.0–15.0)
Hemoglobin: 7.8 g/dL — ABNORMAL LOW (ref 12.0–15.0)
MCH: 31.1 pg (ref 26.0–34.0)
MCH: 31.1 pg (ref 26.0–34.0)
MCH: 30.5 pg (ref 26.0–34.0)
MCHC: 32.8 g/dL (ref 30.0–36.0)
MCHC: 32.9 g/dL (ref 30.0–36.0)
MCHC: 32.9 g/dL (ref 30.0–36.0)
MCV: 94.7 fL (ref 80.0–100.0)
MCV: 94.6 fL (ref 80.0–100.0)
MCV: 92.6 fL (ref 80.0–100.0)
Platelets: 73 10*3/uL — ABNORMAL LOW (ref 150–400)
Platelets: 46 10*3/uL — ABNORMAL LOW (ref 150–400)
Platelets: 63 10*3/uL — ABNORMAL LOW (ref 150–400)
RBC: 2.64 MIL/uL — ABNORMAL LOW (ref 3.87–5.11)
RBC: 2.22 MIL/uL — ABNORMAL LOW (ref 3.87–5.11)
RBC: 2.56 MIL/uL — ABNORMAL LOW (ref 3.87–5.11)
RDW: 16 % — ABNORMAL HIGH (ref 11.5–15.5)
RDW: 15.8 % — ABNORMAL HIGH (ref 11.5–15.5)
RDW: 15.8 % — ABNORMAL HIGH (ref 11.5–15.5)
WBC: 19.5 10*3/uL — ABNORMAL HIGH (ref 4.0–10.5)
WBC: 14.9 10*3/uL — ABNORMAL HIGH (ref 4.0–10.5)
WBC: 5.7 10*3/uL (ref 4.0–10.5)
nRBC: 0.2 % (ref 0.0–0.2)
nRBC: 0.7 % — ABNORMAL HIGH (ref 0.0–0.2)
nRBC: 8.4 % — ABNORMAL HIGH (ref 0.0–0.2)

## 2023-06-30 LAB — POCT I-STAT EG7
Acid-base deficit: 10 mmol/L — ABNORMAL HIGH (ref 0.0–2.0)
Bicarbonate: 14.3 mmol/L — ABNORMAL LOW (ref 20.0–28.0)
Calcium, Ion: 1.18 mmol/L (ref 1.15–1.40)
HCT: 21 % — ABNORMAL LOW (ref 36.0–46.0)
Hemoglobin: 7.1 g/dL — ABNORMAL LOW (ref 12.0–15.0)
O2 Saturation: 100 %
Patient temperature: 36.6
Potassium: 5.4 mmol/L — ABNORMAL HIGH (ref 3.5–5.1)
Sodium: 132 mmol/L — ABNORMAL LOW (ref 135–145)
TCO2: 15 mmol/L — ABNORMAL LOW (ref 22–32)
pCO2, Ven: 24.4 mmHg — ABNORMAL LOW (ref 44–60)
pH, Ven: 7.376 (ref 7.25–7.43)
pO2, Ven: 241 mmHg — ABNORMAL HIGH (ref 32–45)

## 2023-06-30 LAB — MAGNESIUM
Magnesium: 2.5 mg/dL — ABNORMAL HIGH (ref 1.7–2.4)
Magnesium: 2.4 mg/dL (ref 1.7–2.4)

## 2023-06-30 LAB — CG4 I-STAT (LACTIC ACID)
Lactic Acid, Venous: 12.1 mmol/L (ref 0.5–1.9)
Lactic Acid, Venous: 14.7 mmol/L (ref 0.5–1.9)
Lactic Acid, Venous: 14 mmol/L (ref 0.5–1.9)

## 2023-06-30 LAB — TRIGLYCERIDES: Triglycerides: 102 mg/dL (ref <150)

## 2023-06-30 MED ORDER — DOCUSATE SODIUM 50 MG/5ML PO LIQD
200.0000 mg | Freq: Every day | ORAL | Status: DC
Start: 2023-06-30 — End: 2023-07-01
  Administered 2023-06-30: 200 mg
  Filled 2023-06-30: qty 20

## 2023-06-30 MED ORDER — CALCIUM CHLORIDE 10 % IV SOLN
1.0000 g | Freq: Once | INTRAVENOUS | Status: AC
  Administered 2023-06-30: 1 g via INTRAVENOUS

## 2023-06-30 MED ORDER — VITAL HIGH PROTEIN PO LIQD
1000.0000 mL | ORAL | Status: DC
Start: 2023-06-30 — End: 2023-06-30

## 2023-06-30 MED ORDER — SODIUM BICARBONATE 8.4 % IV SOLN
INTRAVENOUS | Status: AC
  Filled 2023-06-30: qty 50

## 2023-06-30 MED ORDER — SODIUM BICARBONATE 8.4 % IV SOLN
50.0000 meq | Freq: Once | INTRAVENOUS | Status: AC
  Administered 2023-06-30: 50 meq via INTRAVENOUS

## 2023-06-30 MED ORDER — SODIUM CHLORIDE 0.9% IV SOLUTION: Freq: Once | INTRAVENOUS | Status: DC

## 2023-06-30 MED ORDER — SODIUM BICARBONATE 8.4 % IV SOLN
INTRAVENOUS | Status: AC
  Filled 2023-06-30: qty 100

## 2023-06-30 MED ORDER — ALBUMIN HUMAN 5 % IV SOLN
INTRAVENOUS | Status: AC
  Administered 2023-06-30: 12.5 g via INTRAVENOUS
  Filled 2023-06-30: qty 250

## 2023-06-30 MED ORDER — SODIUM BICARBONATE 8.4 % IV SOLN
INTRAVENOUS | Status: DC
  Filled 2023-06-30 (×2): qty 1000

## 2023-06-30 MED ORDER — CALCIUM CHLORIDE 10 % IV SOLN
INTRAVENOUS | Status: AC
  Filled 2023-06-30: qty 20

## 2023-06-30 MED ORDER — SODIUM BICARBONATE 8.4 % IV SOLN
100.0000 meq | Freq: Once | INTRAVENOUS | Status: AC
  Administered 2023-06-30: 100 meq via INTRAVENOUS

## 2023-06-30 MED ORDER — ATORVASTATIN CALCIUM 40 MG PO TABS
40.0000 mg | ORAL_TABLET | Freq: Every day | ORAL | Status: DC
  Administered 2023-06-30: 40 mg
  Filled 2023-06-30: qty 1

## 2023-06-30 MED ORDER — DEXTROSE 50 % IV SOLN
INTRAVENOUS | Status: AC
  Filled 2023-06-30: qty 50

## 2023-06-30 MED ORDER — DOBUTAMINE-DEXTROSE 4-5 MG/ML-% IV SOLN
10.0000 ug/kg/min | INTRAVENOUS | Status: DC
  Administered 2023-06-30: 5 ug/kg/min via INTRAVENOUS
  Filled 2023-06-30 (×2): qty 250

## 2023-06-30 MED ORDER — DEXTROSE 50 % IV SOLN
12.5000 g | INTRAVENOUS | Status: AC
  Administered 2023-06-30: 12.5 g via INTRAVENOUS

## 2023-06-30 MED ORDER — ORAL CARE MOUTH RINSE
15.0000 mL | OROMUCOSAL | Status: DC
  Administered 2023-06-30 – 2023-07-01 (×8): 15 mL via OROMUCOSAL

## 2023-06-30 MED ORDER — DEXTROSE 50 % IV SOLN
INTRAVENOUS | Status: AC
  Administered 2023-06-30: 25 g via INTRAVENOUS
  Filled 2023-06-30: qty 50

## 2023-06-30 MED ORDER — ROCURONIUM BROMIDE 10 MG/ML (PF) SYRINGE: 25.0000 mg | PREFILLED_SYRINGE | Freq: Once | INTRAVENOUS | Status: AC

## 2023-06-30 MED ORDER — PRISMASOL BGK 0/2.5 32-2.5 MEQ/L EC SOLN: Status: DC

## 2023-06-30 MED ORDER — NOREPINEPHRINE 16 MG/250ML-% IV SOLN
0.0000 ug/min | INTRAVENOUS | Status: DC
Start: 2023-06-30 — End: 2023-07-01
  Administered 2023-06-30: 52 ug/min via INTRAVENOUS
  Administered 2023-06-30: 32 ug/min via INTRAVENOUS
  Administered 2023-06-30: 53 ug/min via INTRAVENOUS
  Administered 2023-06-30: 33 ug/min via INTRAVENOUS
  Administered 2023-07-01: 80 ug/min via INTRAVENOUS
  Filled 2023-06-30: qty 250
  Filled 2023-06-30: qty 500
  Filled 2023-06-30 (×3): qty 250

## 2023-06-30 MED ORDER — HEPARIN SODIUM (PORCINE) 5000 UNIT/ML IJ SOLN
5000.0000 [IU] | Freq: Three times a day (TID) | INTRAMUSCULAR | Status: DC
  Administered 2023-06-30 (×3): 5000 [IU] via SUBCUTANEOUS
  Filled 2023-06-30 (×3): qty 1

## 2023-06-30 MED ORDER — ETOMIDATE 2 MG/ML IV SOLN: 10.0000 mg | Freq: Once | INTRAVENOUS | Status: AC

## 2023-06-30 MED ORDER — DEXTROSE 10 % IV SOLN
INTRAVENOUS | Status: DC
Start: 2023-06-30 — End: 2023-07-01

## 2023-06-30 MED ORDER — AMIODARONE HCL 200 MG PO TABS
400.0000 mg | ORAL_TABLET | Freq: Two times a day (BID) | ORAL | Status: DC
  Administered 2023-06-30: 400 mg
  Filled 2023-06-30: qty 2

## 2023-06-30 MED ORDER — MIDAZOLAM HCL 2 MG/2ML IJ SOLN: 2.0000 mg | Freq: Once | INTRAMUSCULAR | Status: AC

## 2023-06-30 MED ORDER — MIDAZOLAM BOLUS VIA INFUSION: 0.0000 mg | INTRAVENOUS | Status: DC | PRN

## 2023-06-30 MED ORDER — AMIODARONE HCL IN DEXTROSE 360-4.14 MG/200ML-% IV SOLN
INTRAVENOUS | Status: AC
  Filled 2023-06-30: qty 200

## 2023-06-30 MED ORDER — SODIUM CHLORIDE 0.9 % IV SOLN
1.0000 g | Freq: Once | INTRAVENOUS | Status: AC
  Administered 2023-06-30: 1 g via INTRAVENOUS
  Filled 2023-06-30 (×2): qty 10

## 2023-06-30 MED ORDER — HYDROCORTISONE SOD SUC (PF) 100 MG IJ SOLR
100.0000 mg | Freq: Two times a day (BID) | INTRAMUSCULAR | Status: DC
  Administered 2023-06-30 – 2023-07-01 (×2): 100 mg via INTRAVENOUS
  Filled 2023-06-30 (×2): qty 2

## 2023-06-30 MED ORDER — ALBUMIN HUMAN 25 % IV SOLN
25.0000 g | Freq: Once | INTRAVENOUS | Status: AC
  Administered 2023-06-30: 25 g via INTRAVENOUS
  Filled 2023-06-30: qty 100

## 2023-06-30 MED ORDER — ORAL CARE MOUTH RINSE: 15.0000 mL | OROMUCOSAL | Status: DC | PRN

## 2023-06-30 MED ORDER — FAMOTIDINE 20 MG PO TABS
20.0000 mg | ORAL_TABLET | Freq: Every day | ORAL | Status: DC
  Administered 2023-06-30: 20 mg
  Filled 2023-06-30: qty 1

## 2023-06-30 MED ORDER — CALCIUM CHLORIDE 10 % IV SOLN
2.0000 g | Freq: Once | INTRAVENOUS | Status: AC
  Administered 2023-06-30: 2 g via INTRAVENOUS

## 2023-06-30 MED ORDER — CLONAZEPAM 1 MG PO TABS
1.0000 mg | ORAL_TABLET | Freq: Two times a day (BID) | ORAL | Status: DC
  Administered 2023-06-30: 1 mg
  Filled 2023-06-30: qty 1

## 2023-06-30 MED ORDER — DEXTROSE 50 % IV SOLN
25.0000 g | INTRAVENOUS | Status: AC
Start: 2023-06-30 — End: 2023-06-30

## 2023-06-30 MED ORDER — ALBUMIN HUMAN 5 % IV SOLN: 12.5000 g | Freq: Once | INTRAVENOUS | Status: AC

## 2023-06-30 MED ORDER — MIDAZOLAM-SODIUM CHLORIDE 100-0.9 MG/100ML-% IV SOLN
0.0000 mg/h | INTRAVENOUS | Status: DC
  Administered 2023-06-30: 2 mg/h via INTRAVENOUS
  Filled 2023-06-30: qty 100

## 2023-06-30 MED ORDER — PANTOPRAZOLE SODIUM 40 MG IV SOLR
40.0000 mg | INTRAVENOUS | Status: DC
  Administered 2023-06-30: 40 mg via INTRAVENOUS
  Filled 2023-06-30: qty 10

## 2023-06-30 MED ORDER — METHYLENE BLUE (ANTIDOTE) 1 % IV SOLN
1.0000 mg/kg | Freq: Once | INTRAVENOUS | Status: AC
  Administered 2023-06-30: 90 mg via INTRAVENOUS
  Filled 2023-06-30: qty 9

## 2023-06-30 MED ORDER — SODIUM CHLORIDE 0.9% IV SOLUTION: Freq: Once | INTRAVENOUS | Status: AC

## 2023-06-30 MED ORDER — IOHEXOL 350 MG/ML SOLN
75.0000 mL | Freq: Once | INTRAVENOUS | Status: AC | PRN
Start: 2023-06-30 — End: 2023-06-30
  Administered 2023-06-30: 75 mL via INTRAVENOUS

## 2023-06-30 MED ORDER — IPRATROPIUM-ALBUTEROL 0.5-2.5 (3) MG/3ML IN SOLN
3.0000 mL | Freq: Four times a day (QID) | RESPIRATORY_TRACT | Status: DC
  Administered 2023-06-30: 3 mL via RESPIRATORY_TRACT
  Filled 2023-06-30: qty 3

## 2023-06-30 MED ORDER — THIAMINE HCL 100 MG/ML IJ SOLN
100.0000 mg | Freq: Every day | INTRAMUSCULAR | Status: DC
  Administered 2023-06-30: 100 mg via INTRAVENOUS
  Filled 2023-06-30: qty 2
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
## 2023-06-30 NOTE — Progress Notes (Signed)
 I called Karla Foster's sister Karla Foster to update her on her sister's decline and tenuous status. We are continuing to do everything we can, but she is critical. Her sister is coming to the hospital.  Joesph Mussel, DO 06/30/23 12:47 PM [facility]  For contact information, see Amion. If no response to pager, please call PCCM consult pager. After hours, 7PM- 7AM, please call Elink.

## 2023-06-30 NOTE — Procedures (Signed)
 I have reviewed the pt's issues and adjusted the CRRT session to match the needs of the patient.  Larry Poag MD  [facility] 06/30/2023, 10:46 AM

## 2023-06-30 NOTE — Progress Notes (Addendum)
 LA still rising, but coox has been preserved today and echo does not show significant RV failure.   Adding stress dose steroids, methylene blue Stop propofol , add versed  gtt Add DBA, trying to come down on epinephrine  in case this is driving lactic acidosis. If repeat LA still staying high despite med changes, may need CT to evaluate for mesenteric ischemia  D/w AHF.   Joesph Mussel, DO 06/30/23 12:58 PM [facility]  For contact information, see Amion. If no response to pager, please call PCCM consult pager. After hours, 7PM- 7AM, please call Elink.    Low BP with MAP in 40s. Increasing pressors. ABG with severe acidosis--2 amps bicarb given, 2 g Ca+ . Run of VT causing worse hypotension she was able to be paced out of in VVI. BP responded to V pacing and bicarb + calcium  pushes. On DBA 5, milrinone  0.375, epi 17, vaso 0.04. NE 50.  PAPI ~0.9- 2.0. Good pulsatility of Aline with PP ~40 with low diastolics. Unable to get CO/CI from [place].  Bicarb gtt ordered. Labs pending Albumin  bolus to see if she is volume responsive. Vent adjusted-- changed to 8cc/kg, RR increased to 35 to compensate for metabolic acidosis.  Sister updated at bedside. Dr. Luna Salinas at bedside updating her as well.  Additional cc time: 45 min.  Joesph Mussel, DO 06/30/23 2:23 PM [facility]  For contact information, see Amion. If no response to pager, please call PCCM consult pager. After hours, 7PM- 7AM, please call Elink.    Reviewed case with Dr. Mitzie Anda and Luna Salinas. Based on hemodynamics seems most likely distributive shock. Worried about mesenteric ischemia. We all agree that a CT with contrast is needed. D/w Nephrology who agree that benefits >risk in this case, and she will go back on CRRT after the CT to clear contrast.  Joesph Mussel, DO 06/30/23 2:42 PM [facility]  For contact information, see Amion. If no response to pager,  please call PCCM consult pager. After hours, 7PM- 7AM, please call Elink.    Labs this afternoon consistent with hemolytic anemia and consumptive thrombocytopenia and coagulopathy, but no schistocytes to confirm DIC. Clinically has bruising and petechial rash to suggest this. Getting 2 pRBC, 1 platelets, 4 FFP, 1 cyro now, then will repeat DIC panel. Has clotted another CRRT filter before these transfusions> back on now.   CT shows pneumonia and variable perfusion within the liver> no obvious cause of this degree of compensation. Already on very broad antibiotics that would cover pneumonia.   Another episode of VT this evening-- able to be paced out. Have avoided amiodarone  so far due to profound hypoxia. A-wires not working so back in VVI rather than DDD, rate at 100 per cardiology recommendations.   1g calcium  gluconate after pRBC.  Sister updated at bedside again this evening about CT results and concern for DIC. She remains very tenuous with guarded prognosis.   Additional cc time: 40 min.  Joesph Mussel, DO 06/30/23 6:35 PM [facility]

## 2023-06-30 NOTE — Plan of Care (Signed)
   Problem: Elimination: Goal: Will not experience complications related to bowel motility Outcome: Progressing   Problem: Safety: Goal: Ability to remain free from injury will improve Outcome: Progressing   Problem: Skin Integrity: Goal: Risk for impaired skin integrity will decrease Outcome: Progressing

## 2023-06-30 NOTE — Progress Notes (Addendum)
 Patient ID: Karla Foster, female   DOB: [birth year], 68 y.o.   MRN: [id number]  Pressor requirement increasing again and she is not clearing lactate, now > 9.  Co-ox was 65% earlier this morning.  I reviewed her echo, LV EF appears normal, bioprosthetic valves appear stable, and RV appears only mildly dilated and dysfunctional.  CVP has dropped to around 14.   I do not have a good explanation for the worsening pressor requirement and persistent lactic acidosis.  Initially I suspected RV failure, but by echo, RV does not look severely dysfunctional and co-ox is normal.  She does not appear to be actively bleeding from anywhere.  We will try to get epinephrine  dose down with the lactic acidosis and use dobutamine instead, will also try to get her off Propofol . Will give methylene blue for BP. Will add stress dose steroids.  This appears to be a distributive shock picture, need to consider infection, gut ischemia => need CT chest/abdomen/pelvis today. She is covered with abx. Discussed with Drs Luna Salinas and Fulton Job, think this is primarily a distributive shock picture and would not use ECMO or RV mechanical support. Family updated.   Peder Bourdon 06/30/2023 12:59 PM

## 2023-06-30 NOTE — Consult Note (Signed)
 Renal Service Consult Note [facility]  Rashidah Tortoriello 06/30/2023 Lynae Sandifer, MD Requesting Physician: Dr. Sherene Dilling  Reason for Consult: Renal failure HPI: The patient is a 68 y.o. year-old w/ PMH as below who presented on 06/24/2023 with complaints of shortness of breath and palpitations.  She was found to be in A-fib with RVR and cardiology was called consulted and started IV amiodarone .  Heart failure team was also consulted.  TEE was done on 06/26/2023 which showed LV EF of 35%, improved to 50% after DCCV.  There was significant valve disease thought to be rheumatic, with severe MR and moderate to severe AI.  Patient was diuresed and converted to sinus rhythm with DCCV.  Heart surgery team was consulted and felt that patient would need AVR, MVR and MAZE.  On 06/28/2023 patient underwent mitral valve and aortic valve replacement, and biatrial MAZE procedure.  On 06/29/2023 patient went into shock and urine output dropped off.  Vasopressors were started.  CVP was high in the 25-30 range.  CHF team suspected RV failure.  CCM placed a temporary HD catheter and we were consulted for CRRT.   Pt seen in ICU room. Pt is intubated and sedated, no hx was obtained.    ROS - n/a  PMH: Anxiety Chronic back pain Depression Dyslipidemia HTN GERD Hyperthyroidism (treated in the past) Hx of pulmonary TB  Past Surgical History  Past Surgical History:  Procedure Laterality Date   ANTERIOR LATERAL LUMBAR FUSION 4 LEVELS [name] 07/10/2016   Procedure: Lateral approach for Lumbar One corpectomy with expandable cage, Thoracic Ten-Lumbar Three  dorsal fixation and fusion;  Surgeon: Ditty, Raelene Bullocks, MD;  Location: [facility] OR;  Service: Neurosurgery;  Laterality: [name];  Thoracic/Lumbar   APPLICATION OF ROBOTIC ASSISTANCE FOR SPINAL PROCEDURE [name] 07/10/2016   Procedure: APPLICATION OF ROBOTIC ASSISTANCE FOR SPINAL PROCEDURE;  Surgeon: Ditty, Raelene Bullocks, MD;  Location: [facility] OR;  Service:  Neurosurgery;  Laterality: [name];  Thoracic/Lumbar   BLADDER REPAIR     after hysterectomy   CARDIOVERSION [name] 06/26/2023   Procedure: CARDIOVERSION;  Surgeon: Darlis Eisenmenger, MD;  Location: [facility] INVASIVE CV LAB;  Service: Cardiovascular;  Laterality: [name];   CHOLECYSTECTOMY, LAPAROSCOPIC     LUMBAR SPINE SURGERY  07/10/2016   corpectomy    lumbar 1    RIGHT/LEFT HEART CATH AND CORONARY ANGIOGRAPHY [name] 06/25/2023   Procedure: RIGHT/LEFT HEART CATH AND CORONARY ANGIOGRAPHY;  Surgeon: Darlis Eisenmenger, MD;  Location: [facility] INVASIVE CV LAB;  Service: Cardiovascular;  Laterality: [name];   TRANSESOPHAGEAL ECHOCARDIOGRAM (CATH LAB) [name] 06/26/2023   Procedure: TRANSESOPHAGEAL ECHOCARDIOGRAM;  Surgeon: Darlis Eisenmenger, MD;  Location: [facility] INVASIVE CV LAB;  Service: Cardiovascular;  Laterality: [name];   TRANSTHORACIC ECHOCARDIOGRAM     EF> 55%; mild to moderate aortic regurgitation.   VAGINAL HYSTERECTOMY     Family History  Family History  Problem Relation Age of Onset   Coronary artery disease Father        CABG 11's, died 68 y/o   Colon polyps Father 49       surgery 2ary to polyps   Coronary artery disease Brother        died 49y/o MI   Colon cancer Neg Hx    Social History  reports that she has quit smoking. Her smoking use included cigarettes. She has never used smokeless tobacco. She reports that she does not drink alcohol and does not use drugs. Allergies  Allergies  Allergen Reactions   Amoxicillin Other (  See Comments)    Tongue swelling   Home medications Prior to Admission medications   Medication Sig Start Date End Date Taking? Authorizing Provider  atorvastatin  (LIPITOR) 40 MG tablet Take 1 tablet (40 mg total) by mouth daily. 08/17/22  Yes Rodney Clamp, MD  clonazePAM  (KLONOPIN ) 1 MG tablet TAKE 1 TABLET BY MOUTH 2 TIMES DAILY AS NEEDED FOR ANXIETY. 04/30/23  Yes Rodney Clamp, MD  DULoxetine  (CYMBALTA ) 60 MG capsule Take 1 capsule (60 mg total) by mouth daily. 08/17/22  Yes Rodney Clamp, MD  esomeprazole  (NEXIUM ) 40 MG capsule Take 1 capsule (40 mg total) by mouth daily at 12 noon. 08/17/22  Yes Rodney Clamp, MD  gabapentin  (NEURONTIN ) 300 MG capsule Take 1 capsule (300 mg total) by mouth 3 (three) times daily. 06/24/23  Yes Rodney Clamp, MD  HYDROcodone -acetaminophen  (NORCO/VICODIN) 5-325 MG tablet Take 1-2 tablets by mouth every 4 (four) hours as needed. Patient taking differently: Take 1-2 tablets by mouth every 4 (four) hours as needed for moderate pain (pain score 4-6) or severe pain (pain score 7-10). 06/24/23  Yes Rodney Clamp, MD  trazodone  (DESYREL ) 300 MG tablet Take 1 tablet (300 mg total) by mouth at bedtime. 08/17/22  Yes Rodney Clamp, MD  ondansetron  (ZOFRAN ) 4 MG tablet Take 1 tablet (4 mg total) by mouth every 8 (eight) hours as needed for nausea or vomiting. Patient not taking: Reported on 06/24/2023 06/24/23   Rodney Clamp, MD     Vitals:   06/30/23 2841 06/30/23 0535 06/30/23 0536 06/30/23 0537  BP:      Pulse:    90  Resp: (!) 22 (!) 23 (!) 21 (!) 21  Temp: 98.4 F (36.9 C) 98.4 F (36.9 C) 98.4 F (36.9 C) 98.4 F (36.9 C)  TempSrc:      SpO2:    97%  Weight:      Height:       Exam Gen on vent, sedated No rash, cyanosis or gangrene Sclera anicteric, throat w/ ETT No jvd or bruits Chest clear anterior/ lateral RRR no MRG Abd soft ntnd no mass or ascites +bs GU foley cath, no UOP MS no joint effusions or deformity Ext no LE or UE edema, no wounds or ulcers Neuro is on vent, sedated   Temp HD cath R fem position - in use   Renal-related home meds: None Others: Statin, Klonopin , Cymbalta , PPI, Neurontin , Norco, trazodone , Zofran     Labs today -> Na 129, K+ 4.2, BUN 15 , creat 1.39    Ca 7.0, alb 2.4, phos 5.9                        Wbc 19K Hb 8.5 plt 73kg     CXR 5/10 - bilat pulm edema/ bilat infiltrates                         CXR 5/11 --> maybe somewhat improved pulm edema   I/O = +3.7 L yesterday   I/O = - 640 cc  today so far   Assessment/ Plan: AKI - b/l creat is 0.80. Creat here was 1.13 on admit, improved to 0.70 on 5/09 and then ^'d to 1.65 on 5/10 after valve surgery 5/09. Pt went into shock post-op which was likely cause of the AKI. UOP dropped to minimal. Shock is possibly cardiogenic per CHF team (RV failure). Plan is to cont CRRT (  started just after midnight 5/11). No heparin  for now since post-op.  Shock: possibly cardiogenic w/ high CVP and PA pressures post-op. Getting support w/ vasopressors x 3 and milrinone  gtt. Lowering CVP/ vol may be helping to improve CO/ CI.  Volume: I/O since admit =  +6 L. Wt's show + 15kg, not sure that is accurate. CRRT started late last night, UF has been running about 200 cc/hr.  Today will lower UF goal to about 100 cc/hr net negative per CHF team.  Valvular heart disease: sp MVR/ AVR bioproshetics on 5/09 Atrial fib: sp biatrial MAZE procedure on 5/09, per cardiology AHRF: reintubated now, per CCM      Rob Dalan Cowger  MD [facility] 06/30/2023, 5:59 AM  Recent Labs  Lab 06/24/23 1053 06/25/23 0515 06/29/23 1723 06/29/23 1732 06/29/23 2136 06/30/23 0007 06/30/23 0333  HGB 12.4   < > 8.5*   < > 9.9* 7.8* 8.5*  ALBUMIN  3.4*  --   --   --   --   --   --   CALCIUM  9.1   < > 7.5*  --  7.2*  --   --   CREATININE 0.92   < > 1.63*  --  1.65*  --   --   K 3.9   < > 4.1   < > 3.9 3.9 4.2   < > = values in this interval not displayed.   Inpatient medications:  sodium chloride    Intravenous Once   acetaminophen   1,000 mg Oral Q6H   Or   acetaminophen  (TYLENOL ) oral liquid 160 mg/5 mL  1,000 mg Per Tube Q6H   amiodarone   400 mg Oral BID   aspirin  EC  325 mg Oral Daily   Or   aspirin   324 mg Per Tube Daily   atorvastatin   40 mg Oral Daily   bisacodyl   10 mg Oral Daily   Or   bisacodyl   10 mg Rectal Daily   Chlorhexidine  Gluconate Cloth  6 each Topical Daily   clonazePAM   1 mg Oral BID   docusate sodium   200 mg Oral Daily   DULoxetine   60 mg Oral Daily    fentaNYL  (SUBLIMAZE ) injection  25 mcg Intravenous Once   gabapentin   300 mg Oral TID   insulin  aspart  0-24 Units Subcutaneous Q4H   pantoprazole   40 mg Oral Daily   polyethylene glycol  17 g Per Tube Daily   sodium chloride  flush  3 mL Intravenous Q12H   sodium chloride  flush  3 mL Intravenous Q12H   traZODone   50 mg Oral QHS    dexmedetomidine  (PRECEDEX ) IV infusion Stopped (06/29/23 0857)   epinephrine  8 mcg/min (06/30/23 0500)   fentaNYL  infusion INTRAVENOUS 25 mcg/hr (06/30/23 0500)   meropenem (MERREM) IV     milrinone  0.375 mcg/kg/min (06/30/23 0500)   norepinephrine  (LEVOPHED ) Adult infusion 32 mcg/min (06/30/23 0500)   prismasol BGK 4/2.5 1,500 mL/hr at 06/30/23 0339   prismasol BGK 4/2.5 400 mL/hr at 06/30/23 0021   prismasol BGK 4/2.5 400 mL/hr at 06/30/23 0021   propofol  (DIPRIVAN ) infusion 20 mcg/kg/min (06/30/23 0500)   vancomycin  Stopped (06/29/23 2324)   vasopressin 0.04 Units/min (06/30/23 0510)   acetaminophen , fentaNYL , heparin , metoprolol  tartrate, midazolam , morphine  injection, ondansetron  (ZOFRAN ) IV, oxyCODONE , sodium chloride  flush, sodium chloride  flush, traMADol 

## 2023-06-30 NOTE — Progress Notes (Signed)
 2 Days Post-Op Procedure(s) (LRB): AORTIC VALVE REPLACEMENT USING 21 MM INSPIRIS AORTIC VALVE ([name]) MITRAL VALVE REPLACEMENT USING 27 MM MEDTRONIC MOSAIC BIOPROSTHESIS MITRAL VALVE ([name]) COX MAZE PROCEDURE ([name]) ECHOCARDIOGRAM, TRANSESOPHAGEAL, INTRAOPERATIVE ([name]) CLIPPING, LEFT ATRIAL APPENDAGE USING 40 MM ATRICURE ATRICLIP Subjective: Reintubated overnight for acute respiratory failure  Objective: Vital signs in last 24 hours: Temp:  [97.2 F (36.2 C)-98.8 F (37.1 C)] 98.6 F (37 C) (05/11 0900) Pulse Rate:  [76-270] 90 (05/11 0839) Cardiac Rhythm: Atrial paced (05/11 0800) Resp:  [0-37] 20 (05/11 0900) BP: (40-145)/(21-89) 100/47 (05/11 0839) SpO2:  [69 %-100 %] 96 % (05/11 0611) Arterial Line BP: (88-240)/(41-237) 99/46 (05/11 0900) FiO2 (%):  [50 %-100 %] 50 % (05/11 0839) Weight:  [90.4 kg] 90.4 kg (05/11 0500)  Hemodynamic parameters for last 24 hours: PAP: (36-82)/(11-47) 40/18 CVP:  [2 mmHg-59 mmHg] 23 mmHg  Intake/Output from previous day: 05/10 0701 - 05/11 0700 In: 5026.1 [P.O.:960; I.V.:3474.8; IV Piggyback:581.3] Out: 2452.4 [Urine:305; Chest Tube:330] Intake/Output this shift: Total I/O In: 287.6 [I.V.:187.6; IV Piggyback:100] Out: 444 [Urine:15; Chest Tube:10]  General appearance: cooperative Neurologic: no focal deficit Heart: regular rate and rhythm Lungs: rhonchi faint bilateral Abdomen: normal findings: soft, non-tender  Lab Results: Recent Labs    06/29/23 2136 06/30/23 0007 06/30/23 0332 06/30/23 0333  WBC 19.8*  --  19.5*  --   HGB 9.9*   < > 8.2* 8.5*  HCT 29.4*   < > 25.0* 25.0*  PLT 80*  --  73*  --    < > = values in this interval not displayed.   BMET:  Recent Labs    06/29/23 2136 06/30/23 0007 06/30/23 0332 06/30/23 0333  NA 135   < > 126* 129*  K 3.9   < > 4.2 4.2  CL 92*  --  92*  --   CO2 25  --  16*  --   GLUCOSE 137*  --  241*  --   BUN 19  --  15  --   CREATININE 1.65*  --  1.39*  --   CALCIUM  7.2*  --  7.0*   --    < > = values in this interval not displayed.    PT/INR:  Recent Labs    06/28/23 1814  LABPROT 14.2  INR 1.1   ABG    Component Value Date/Time   PHART 7.246 (L) 06/30/2023 0333   HCO3 17.7 (L) 06/30/2023 0333   TCO2 19 (L) 06/30/2023 0333   ACIDBASEDEF 9.0 (H) 06/30/2023 0333   O2SAT 66.9 06/30/2023 0520   CBG (last 3)  Recent Labs    06/30/23 0315 06/30/23 0751 06/30/23 0813  GLUCAP 117* 51*  50* 199*    Assessment/Plan: S/P Procedure(s) (LRB): AORTIC VALVE REPLACEMENT USING 21 MM INSPIRIS AORTIC VALVE ([name]) MITRAL VALVE REPLACEMENT USING 27 MM MEDTRONIC MOSAIC BIOPROSTHESIS MITRAL VALVE ([name]) COX MAZE PROCEDURE ([name]) ECHOCARDIOGRAM, TRANSESOPHAGEAL, INTRAOPERATIVE ([name]) CLIPPING, LEFT ATRIAL APPENDAGE USING 40 MM ATRICURE ATRICLIP POD # 2 NEURO- no focal deficit, mental status variable prior to reintubation CV- s/p AVR, MVR, Maze Severe right heart failure remains on high dose pressors, hemodynamics improved  Milrinone  0.375, epi 11, norepi 33  Co-ox 65, PA pressures down to 37/21, CVP down as well RESP- Reintubated for respiratory failure  PRVC 25, 50%, 5 PEEP RENAL- creatinine only 1.39 but oliguric and unresponsive to diuretics  On CVVHD currently ENDO- severe hypoglycemia this AM  D50 given GI- NPO, OG to be repositioned Anemia- Hgb stable  at 8.5 Thrombocytopenia- PLT down slightly to 73K ID- started on vanco and meropenem empirically last night Critically ill with multiple organ system failure  LOS: 6 days    Karla Foster 06/30/2023

## 2023-06-30 NOTE — Progress Notes (Signed)
  Echocardiogram 2D Echocardiogram has been performed.  Fain Home RDCS 06/30/2023, 11:26 AM

## 2023-06-30 NOTE — Progress Notes (Addendum)
 eLink Physician-Brief Progress Note Patient Name: Karla Foster DOB: December 11, 1955 MRN: [id number]   Date of Service  06/30/2023  HPI/Events of Note  68 year old with a history of chronic back pain and heart failure with reduced ejection fraction who was in the ICU post maze procedure but developed acute RV dysfunction in the setting of tricuspid regurgitation complicated by cardiogenic shock.  Consulted for renal replacement therapy and intubation.  Periintubation, patient was hypotensive but now vital signs are within normal limits.  Saturating 80s with 100% saturations and PEEP of 5.  CVP and PA pressures are elevated.  Currently on epinephrine , milrinone , norepinephrine , vasopressin drips receiving propofol  and fentanyl  infusions as well.  Results show metabolic acidosis with adequate oxygenation and ventilation.  eICU Interventions  Switch to 6 cc/kg, increase respiratory rate to reduce peak pressures.  CRRT about to start with goal maximal ultrafiltration as tolerated  Maintain inotropic and vasopressor support for heart failure  Consider adding chemo prophylaxis for DVTs, currently SCDs GI prophylaxis with pantoprazole    0457 -critical lactate of 7.1.  Continue monitoring for now.  Repeat in 3 hours.  Intervention Category Evaluation Type: New Patient Evaluation  [name] 06/30/2023, 1:03 AM

## 2023-06-30 NOTE — Hospital Course (Signed)
 History of Present Illness: This 68 year old woman presented with a several month history of progressive shortness of breath and fatigue and found to be in AF with RVR with hypotension. Initial echo showed an EF of 30-35% with global hypokinesis, moderate RV systolic dysfunction. There was severe MR and AI with rheumatic appearing valves. There was moderate TR. Cath showed normal coronaries with mean PCWP of 25 and mean PAP 35. Venous sat 50% with arterial sat 93%, CI 1.9. She improved with diuresis and conversion to sinus rhythm with DCCV. Her EF improved to 50% with mild RV systolic dysfunction. I agree that the best treatment for her is AVR/MVR with bioprosthetic valves and MAZE. She will likely have a prolonged recovery due to her baseline physical condition with chronic back pain and using a walker to ambulate. I discussed the operative procedure with the patient and family including alternatives, benefits and risks; including but not limited to bleeding, blood transfusion, infection, stroke, myocardial infarction, graft failure, heart block requiring a permanent pacemaker, organ dysfunction, and death.  Gerlene Koh understands and agrees to proceed.  We will schedule surgery for tomorrow morning.   Hospital Course: Ms. Dobson was taken to the operating room on 06/28/2023 where the aortic valve was replaced using a 21 mm Edwards Inspiris Resilia pericardial valve and the mitral valve was replaced with a 27 mm Medtronic Mosaic porcine valve.  Biatrial Maze procedure was performed along with application of a left atrial appendage clip.  Following the procedure, she separated from cardiopulmonary bypass without any difficulty.  The TEE showed good LV function with the prosthetic valves functioning appropriately as well.  She was transferred to the surgical ICU in stable condition on epinephrine , milrinone , and norepinephrine .  She remained hemodynamically stable.  She was extubated in the morning  of postop day 1.  She was initially in a junctional rhythm though epicardial pacing was utilized.  Over the course of the first postoperative day, she had gradually increasing pressor requirements.  She was evaluated at the bedside by Dr. Mitzie Anda and by Dr. Luna Salinas.  An echocardiogram showed good LV function but with a dilated right ventricle.  There was no significant pericardial effusion.  She was started on vasopressin and the milrinone  was increased to 0.375 mics per kilogram per minute.  Metabolic acidosis was corrected with sodium bicarbonate.  The critical care team was consulted for assistance with respiratory failure.  She was reintubated at around 11:30 PM on postop day 1.  Due to suspected right heart failure, it was felt the patient would benefit from CRRT.  The critical care medicine team placed a temporary dialysis catheter.  The nephrology service was consulted and CRRT initiation.  Ms. Bokelman was started on empiric antibiotics for possible pneumonia on postop day 2.  She continued to require hemodynamic and vasopressor support with milrinone , epinephrine , and norepinephrine .

## 2023-06-30 NOTE — Progress Notes (Signed)
 [address]       [place] [address]             [phone]      POD # 2  Intubated  BP (!) 100/47   Pulse (!) 105   Temp (!) 96.6 F (35.9 C)   Resp (!) 30   Ht 5\' 7"  (1.702 m)   Wt 90.4 kg   SpO2 (!) 47%   BMI 31.21 kg/m   50/29 no CI, co-ox 92 Milrinone  0.3785, epi 17, norepi 52, vasopressin 0.04  Intake/Output Summary (Last 24 hours) at 06/30/2023 1945 Last data filed at 06/30/2023 1902 Gross per 24 hour  Intake 6859.49 ml  Output 3858 ml  Net 3001.49 ml   PT 59 INR 6.8 PTT 76 PLT 35K D dimer 6.9 Fibrinogen 175 Albumin  1.7 Lactic acid 14.7  CT CHEST, ABDOMEN, AND PELVIS WITH CONTRAST   TECHNIQUE: Multidetector CT imaging of the chest, abdomen and pelvis was performed following the standard protocol during bolus administration of intravenous contrast.   RADIATION DOSE REDUCTION: This exam was performed according to the departmental dose-optimization program which includes automated exposure control, adjustment of the mA and/or kV according to patient size and/or use of iterative reconstruction technique.   CONTRAST:  75mL OMNIPAQUE  IOHEXOL  350 MG/ML SOLN   COMPARISON:  None Available.   FINDINGS: CT CHEST FINDINGS   Cardiovascular: Right internal jugular vein approach Swan-Ganz catheter with tip at the proximal right central pulmonary artery. Left PICC with tip at the superior cavoatrial junction. Normal heart size. No significant pericardial effusion. The thoracic aorta is normal in caliber. No atherosclerotic plaque of the thoracic aorta. No coronary artery calcifications. Mitral and aortic valve replacements.   Mediastinum/Nodes: No enlarged mediastinal, hilar, or axillary lymph nodes. Thyroid  gland, trachea, and esophagus demonstrate no significant findings. Two mediastinal drains are noted.   Lungs/Pleura: Endotracheal tube terminates 1-2 cm above the carina. Peribronchovascular and peripheral patchy ground-glass  airspace opacities. Bilateral lower lobe passive atelectasis. No pulmonary nodule. No pulmonary mass. Bilateral trace to small, right greater than left, pleural effusions. No pneumothorax.   Musculoskeletal:   No abdominal wall hernia or abnormality.   No suspicious lytic or blastic osseous lesions. No acute displaced fracture.   CT ABDOMEN PELVIS FINDINGS   Hepatobiliary: Heterogeneous opacification of the hepatic parenchyma suggestive of variant perfusion or nutmeg liver. No focal liver abnormality. No gallstones, gallbladder wall thickening, or pericholecystic fluid. No biliary dilatation.   Pancreas: No focal lesion. Normal pancreatic contour. No surrounding inflammatory changes. No main pancreatic ductal dilatation.   Spleen: Normal in size without focal abnormality.   Adrenals/Urinary Tract:   No adrenal nodule bilaterally.   Bilateral kidneys enhance symmetrically.   No hydronephrosis. No hydroureter.   The urinary bladder is fully decompressed and grossly unremarkable. A Foley catheter tip and inflated balloon terminate within the urinary bladder lumen.   Stomach/Bowel: Enteric tube with tip terminating in the region of the pylorus and side port within the gastric lumen. Stomach is within normal limits. No evidence of bowel wall thickening or dilatation. The appendix is not definitely identified with no inflammatory changes in the right lower quadrant to suggest acute appendicitis.   Vascular/Lymphatic: Right femoral approach central venous catheter with tip terminating in the right external iliac vein. Left femoral approach arterial catheter with tip terminating at the distal left common iliac artery. No abdominal aorta or iliac aneurysm. Diminutive iliac arteries. Atherosclerotic plaque of the aorta and  its branches. No abdominal, pelvic, or inguinal lymphadenopathy.   Reproductive: Status post hysterectomy. No adnexal masses.   Other: No intraperitoneal  free fluid. No intraperitoneal free gas. No organized fluid collection.   Musculoskeletal:   No abdominal wall hernia or abnormality. Diffuse moderate subcutaneus soft tissue edema.   No suspicious lytic or blastic osseous lesions. No acute displaced fracture. Multilevel degenerative changes of the spine.L1 corpectomy. T10-L3 surgical hardware.   IMPRESSION: 1. Peribronchovascular and peripheral patchy ground-glass airspace opacities suggestive of infection/inflammation. 2. Bilateral trace to small, right greater than left, pleural effusions. 3. Bilateral iliac arteries are noted to be diminutive. 4.  Aortic Atherosclerosis (ICD10-I70.0) - mild. 5. Mitral and aortic valve replacements. 6. Heterogeneous opacification of the hepatic parenchyma suggestive of variant perfusion or nutmeg liver. 7. Endotracheal tube, enteric tube, 2 mediastinal tubes, left PICC, Swan-Ganz catheter, inferior central venous catheter, inferior central arterial catheter.     Electronically Signed   By: Morgane  Naveau M.D.   On: 06/30/2023 17:54  Picture concerning for acute hepatic failure, DIC Critically ill  Landon Pinion C. Luna Salinas, MD [facility] [phone]

## 2023-06-30 NOTE — Progress Notes (Signed)
 Filter clotted after about 9 hrs. Trying to avoid IV heparin  early post-op -> so we have ^'d the pre-filter IVF's to 1000 cc/hr which can help to lower risk of system clotting.   Larry Poag  MD  [facility] 06/30/2023, 10:44 AM

## 2023-06-30 NOTE — Progress Notes (Addendum)
 0932: Dr. Luna Salinas at bedside, visualized xray of abd. Okay to use OG tube.   1235: Dr. Fulton Job made aware of repeat lactic being >9.0, also made aware of increasing pressor requirements, pharmacy Bambi Lever B made aware that pts blood sugar keeps dropping even with intervention. Will check CBG hourly.

## 2023-06-30 NOTE — Progress Notes (Signed)
 Patient ID: Karla Foster, female   DOB: [birth year], 68 y.o.   MRN: [id number]     Advanced Heart Failure Rounding Note  Cardiologist: Hazle Lites, MD  Chief Complaint: CHF Subjective:    - LHC/RHC (5/6):  1. Normal coronaries, nonischemic cardiomyopathy.  2. Elevated PCWP though LVEDP was not significantly elevated (discrepancy may be related to mitral regurgitation).  3. Borderline elevated right-sided filling pressures.  4. Low CI at 1.9 5. Mild pulmonary venous hypertension.  - 5/7 TEE/DC-CV --> SR - 5/9 Surgery with bioprosthetic AoV and MV, Maze, LA appendage clipping.  - 5/10 Extubated.  Increased pressor requirement through day, minimal response to Lasix  80 mg IV bid.  Bedside echo in evening due to high pressor requirement and CVP up to 28/PASP in 60s showed LV systolic function vigorous with LV EF 60-65%, the RV looked moderately dilated with mildly decreased systolic function, there was moderate to severe tricuspid regurgitation. The bioprosthetic aortic and mitral valves looked stable, there was only a small pericardial effusion.  Patient was re-intubated and started on CVVH due to markedly high CVP and minimal UOP.   She is on epinephrine  11, NE 32, milrinone  0.375, vasopressin 0.04. Lactate last night was 7.  CVVH running UF net 200 cc/hr.  CVP and PA pressure down significantly with CVVH and re-intubation.   She is now on vancomycin /meropenem empirically.   Swan: RA 13 PA 38/19 Unable to obtain accurate thermo CO reading Co-ox 67%  Objective:   Weight Range: 90.4 kg Body mass index is 31.21 kg/m.   Vital Signs:   Temp:  [97.2 F (36.2 C)-98.8 F (37.1 C)] 98.8 F (37.1 C) (05/11 0700) Pulse Rate:  [76-270] 89 (05/11 0611) Resp:  [0-37] 25 (05/11 0700) BP: (40-145)/(21-89) 40/21 (05/10 2315) SpO2:  [69 %-100 %] 96 % (05/11 4259) Arterial Line BP: (88-240)/(41-237) 94/46 (05/11 0700) FiO2 (%):  [40 %-100 %] 50 % (05/11 0600) Weight:  [90.4 kg]  90.4 kg (05/11 0500) Last BM Date : 06/26/23  Weight change: Filed Weights   06/27/23 0630 06/28/23 0645 06/30/23 0500  Weight: 75.3 kg 75.3 kg 90.4 kg    Intake/Output:   Intake/Output Summary (Last 24 hours) at 06/30/2023 0724 Last data filed at 06/30/2023 0700 Gross per 24 hour  Intake 4937.94 ml  Output 2452.4 ml  Net 2485.54 ml    CVP 13  Physical Exam    General: Intubated Neck: JVP 14, no thyromegaly or thyroid  nodule.  Lungs: Decreased at bases CV: Nondisplaced PMI.  Heart regular S1/S2, no S3/S4, no murmur.  Trace ankle edema.  Abdomen: Soft, nontender, no hepatosplenomegaly, no distention.  Skin: Intact without lesions or rashes.  Neurologic: Sedated on vent Extremities: No clubbing or cyanosis.  HEENT: Normal.   Telemetry   A-paced at 90 with long PR interval (personally reviewed)  Labs    CBC Recent Labs    06/29/23 2136 06/30/23 0007 06/30/23 0332 06/30/23 0333  WBC 19.8*  --  19.5*  --   HGB 9.9*   < > 8.2* 8.5*  HCT 29.4*   < > 25.0* 25.0*  MCV 92.5  --  94.7  --   PLT 80*  --  73*  --    < > = values in this interval not displayed.   Basic Metabolic Panel Recent Labs    56/38/75 2133 06/29/23 2136 06/30/23 0007 06/30/23 0332 06/30/23 0333  NA  --  135   < > 126* 129*  K  --  3.9   < > 4.2 4.2  CL  --  92*  --  92*  --   CO2  --  25  --  16*  --   GLUCOSE  --  137*  --  241*  --   BUN  --  19  --  15  --   CREATININE  --  1.65*  --  1.39*  --   CALCIUM   --  7.2*  --  7.0*  --   MG 2.7*  --   --  2.5*  --   PHOS  --   --   --  5.9*  --    < > = values in this interval not displayed.   Liver Function Tests Recent Labs    06/30/23 0332  ALBUMIN  2.4*    No results for input(s): "LIPASE", "AMYLASE" in the last 72 hours. Cardiac Enzymes No results for input(s): "CKTOTAL", "CKMB", "CKMBINDEX", "TROPONINI" in the last 72 hours.  BNP: BNP (last 3 results) Recent Labs    06/24/23 1053  BNP 771.9*    ProBNP (last 3  results) No results for input(s): "PROBNP" in the last 8760 hours.   D-Dimer No results for input(s): "DDIMER" in the last 72 hours. Hemoglobin A1C No results for input(s): "HGBA1C" in the last 72 hours.  Fasting Lipid Panel Recent Labs    06/30/23 0332  TRIG 102   Thyroid  Function Tests No results for input(s): "TSH", "T4TOTAL", "T3FREE", "THYROIDAB" in the last 72 hours.  Invalid input(s): "FREET3"   Other results:   Imaging    DG CHEST PORT 1 VIEW Result Date: 06/30/2023 CLINICAL DATA:  Intubation. EXAM: PORTABLE CHEST 1 VIEW COMPARISON:  Radiograph earlier today FINDINGS: Endotracheal tube tip 3.4 cm from the carina. Right internal jugular Swan-Ganz catheter remains in place. Left upper extremity PICC is unchanged. Presumed mediastinal drain. Portions of the catheter is seen projecting over the upper abdomen. Post median sternotomy with prosthetic cardiac valve and left atrial clipping. Stable heart size and mediastinal contours. Lower lung volumes from prior exam. Interstitial opacities are unchanged from earlier today. There may be small pleural effusions. No visible pneumothorax. Left lung apex is excluded from the field of view. Spinal fusion hardware. IMPRESSION: 1. Endotracheal tube tip 3.4 cm from the carina. 2. Additional support apparatus is unchanged. 3. Lower lung volumes from prior exam. Unchanged interstitial opacities and possible small pleural effusions. Electronically Signed   By: Chadwick Colonel M.D.   On: 06/30/2023 00:00   DG CHEST PORT 1 VIEW Result Date: 06/29/2023 CLINICAL DATA:  [id number] (HFpEF) heart failure with preserved ejection fraction (HCC) [id number] EXAM: PORTABLE CHEST 1 VIEW COMPARISON:  Chest x-ray 06/29/2023 10:19 a.m., chest x-ray 06/28/2023 FINDINGS: Left PICC with tip overlying the superior vena cava. Right internal jugular approach Swan-Ganz catheter with tip overlying the right main pulmonary artery. Mediastinal drain overlies the mediastinum.  Likely left chest tube inferior approach versus second mediastinal drain. Aortic valve replacement. Atrial appendage clip noted. Interval removal of an endotracheal and enteric tube. The heart and mediastinal contours are within normal limits. Prominent hilar vasculature. No focal consolidation. Increased patchy interstitial markings. Bilateral trace pleural effusion. No pneumothorax. No acute osseous abnormality. IMPRESSION: 1. Interval worsening of pulmonary edema. Bilateral trace pleural effusions. Superimposed infection not excluded 2. Interval removal of an endotracheal and enteric tube. Electronically Signed   By: Morgane  Naveau M.D.   On: 06/29/2023 22:59     Medications:     Scheduled Medications:  sodium chloride    Intravenous Once   acetaminophen   1,000 mg Oral Q6H   Or   acetaminophen  (TYLENOL ) oral liquid 160 mg/5 mL  1,000 mg Per Tube Q6H   amiodarone   400 mg Per Tube BID   aspirin  EC  325 mg Oral Daily   Or   aspirin   324 mg Per Tube Daily   atorvastatin   40 mg Per Tube Daily   bisacodyl   10 mg Oral Daily   Or   bisacodyl   10 mg Rectal Daily   Chlorhexidine  Gluconate Cloth  6 each Topical Daily   clonazePAM   1 mg Oral BID   docusate  200 mg Per Tube Daily   famotidine  20 mg Per Tube Daily   fentaNYL  (SUBLIMAZE ) injection  25 mcg Intravenous Once   gabapentin   300 mg Oral TID   insulin  aspart  0-24 Units Subcutaneous Q4H   polyethylene glycol  17 g Per Tube Daily   sodium chloride  flush  3 mL Intravenous Q12H   sodium chloride  flush  3 mL Intravenous Q12H   traZODone   50 mg Oral QHS    Infusions:  dexmedetomidine  (PRECEDEX ) IV infusion Stopped (06/29/23 0857)   epinephrine  11 mcg/min (06/30/23 0657)   fentaNYL  infusion INTRAVENOUS 25 mcg/hr (06/30/23 0600)   meropenem (MERREM) IV     milrinone  0.375 mcg/kg/min (06/30/23 0600)   norepinephrine  (LEVOPHED ) Adult infusion 32 mcg/min (06/30/23 0600)   prismasol BGK 4/2.5 1,500 mL/hr at 06/30/23 0702   prismasol BGK  4/2.5 400 mL/hr at 06/30/23 0021   prismasol BGK 4/2.5 400 mL/hr at 06/30/23 0021   propofol  (DIPRIVAN ) infusion 20 mcg/kg/min (06/30/23 0600)   vancomycin  Stopped (06/29/23 2324)   vasopressin 0.04 Units/min (06/30/23 0600)    PRN Medications: acetaminophen , fentaNYL , heparin , metoprolol  tartrate, midazolam , morphine  injection, ondansetron  (ZOFRAN ) IV, oxyCODONE , sodium chloride  flush, sodium chloride  flush, traMADol     Assessment/Plan   1. Atrial fibrillation: With RVR initially.  5/7 TEE-guided DCCV today.  She is now s/p Maze with LAA clipping at time of valve surgery on 5/9. A-paced today.  - Eventual anticoagulation, will add [place] heparin  DVT prophylaxis.  2. Acute systolic CHF/cardiogenic shock:  Remote echo showed normal EF.  Echo this admission showed EF 30-35%, moderate RV dysfunction, PASP 65, severe MR, moderate TR, moderate-severe AI, IVC dilated.   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  After DCCV, echo images showed improvement in EF from around 35% in AF/RVR to about 50% in NSR. Now s/p bioprosthetic AVR/MVR and Maze on 5/9.  Bedside echo 5/10 showed vigorous LV systolic function with EF 65%, moderate RV dilation/mild RV dysfunction, stable bioprosthetic AVR/MVR and moderate-severe TR.  RA and PA pressures significantly elevated 5/10 after extubation with minimal UOP and increasing pressor requirements => development of significant RV failure. She was re-intubated and started on CVVH overnight 5/9-5/10.  Significant improvement in hemodynamics with intubation + aggressive CVVH, CVP now 13 with PA 38/19, co-ox 67%.  She is now on epinephrine  11, NE 32, milrinone  0.375, vasopressin 0.04.  - Repeat lactate.  - Formal echo today.  - Can back off CVVH to pull net negative UF 100 cc/hr, hopefully this will allow pressor weaning.  - Will keep milrinone  at 0.375 for now.  - RV failure, will need to follow closely with aggressive volume removal.  ?Need for RV support.  3. Valvular  heart disease: TEE showed rheumatic heart disease with moderate-severe AI, moderate TR, severe mitral regurgitation with probably mild mitral stenosis.  Now s/p bioprosthetic AVR and MVR with Maze on 5/9.  4. Chronic low back pain: History of spinal stenosis.  5. Anemia: Post-op, transfuse hgb < 8.  6. Delirium: Mild post-op delirium when she was initially extubated.  7. AKI: In setting of RV failure and markedly high RA pressure (upper 20s).  CVVH begun, CVP now significantly lower.  - Continue CVVH, aim for UF net negative 100 cc/hr.  8. Acute hypoxemic respiratory failure: Re-intubated, positive pressure in chest appears to have helped with RV failure.  - She is on empiric vancomycin /meropenem.   CRITICAL CARE Performed by: Peder Bourdon  Total critical care time: 45 minutes  Critical care time was exclusive of separately billable procedures and treating other patients.  Critical care was necessary to treat or prevent imminent or life-threatening deterioration.  Critical care was time spent personally by me on the following activities: development of treatment plan with patient and/or surrogate as well as nursing, discussions with consultants, evaluation of patient's response to treatment, examination of patient, obtaining history from patient or surrogate, ordering and performing treatments and interventions, ordering and review of laboratory studies, ordering and review of radiographic studies, pulse oximetry and re-evaluation of patient's condition.   Peder Bourdon 06/30/2023 7:24 AM

## 2023-07-01 ENCOUNTER — Inpatient Hospital Stay (HOSPITAL_COMMUNITY)

## 2023-07-01 ENCOUNTER — Encounter (HOSPITAL_COMMUNITY): Payer: Self-pay | Admitting: Surgery

## 2023-07-01 LAB — BPAM FFP
Blood Product Expiration Date: 202505162359
Blood Product Expiration Date: 202505162359
Blood Product Expiration Date: 202505162359
Blood Product Expiration Date: 202505162359
Blood Product Expiration Date: 202505162359
Blood Product Expiration Date: 202505162359
ISSUE DATE / TIME: 202505111748
ISSUE DATE / TIME: 202505111748
ISSUE DATE / TIME: 202505111821
ISSUE DATE / TIME: 202505111821
ISSUE DATE / TIME: 202505112304
ISSUE DATE / TIME: 202505112304
Unit Type and Rh: 5100
Unit Type and Rh: 5100
Unit Type and Rh: 5100
Unit Type and Rh: 9500
Unit Type and Rh: 5100
Unit Type and Rh: 5100

## 2023-07-01 LAB — PREPARE PLATELET PHERESIS: Unit division: 0

## 2023-07-01 LAB — BPAM CRYOPRECIPITATE
Blood Product Expiration Date: 202505162359
ISSUE DATE / TIME: 202505111745
Unit Type and Rh: 5100

## 2023-07-01 LAB — PREPARE FRESH FROZEN PLASMA: Unit division: 0

## 2023-07-01 LAB — BPAM PLATELET PHERESIS
Blood Product Expiration Date: 202505122359
ISSUE DATE / TIME: 202505111806
Unit Type and Rh: 5100

## 2023-07-01 LAB — PREPARE RBC (CROSSMATCH)

## 2023-07-01 LAB — COMPREHENSIVE METABOLIC PANEL WITH GFR
ALT: 719 U/L — ABNORMAL HIGH (ref 0–44)
AST: 4601 U/L — ABNORMAL HIGH (ref 15–41)
Albumin: 2.5 g/dL — ABNORMAL LOW (ref 3.5–5.0)
Alkaline Phosphatase: 146 U/L — ABNORMAL HIGH (ref 38–126)
Anion gap: 21 — ABNORMAL HIGH (ref 5–15)
BUN: 9 mg/dL (ref 8–23)
CO2: 12 mmol/L — ABNORMAL LOW (ref 22–32)
Calcium: 8.1 mg/dL — ABNORMAL LOW (ref 8.9–10.3)
Chloride: 98 mmol/L (ref 98–111)
Creatinine, Ser: 1.12 mg/dL — ABNORMAL HIGH (ref 0.44–1.00)
GFR, Estimated: 54 mL/min — ABNORMAL LOW (ref >60)
Glucose, Bld: 117 mg/dL — ABNORMAL HIGH (ref 70–99)
Potassium: 6.7 mmol/L (ref 3.5–5.1)
Sodium: 131 mmol/L — ABNORMAL LOW (ref 135–145)
Total Bilirubin: 2.5 mg/dL — ABNORMAL HIGH (ref 0.0–1.2)
Total Protein: 4 g/dL — ABNORMAL LOW (ref 6.5–8.1)

## 2023-07-01 LAB — POCT I-STAT 7, (LYTES, BLD GAS, ICA,H+H)
Acid-base deficit: 14 mmol/L — ABNORMAL HIGH (ref 0.0–2.0)
Bicarbonate: 11.7 mmol/L — ABNORMAL LOW (ref 20.0–28.0)
Calcium, Ion: 1.03 mmol/L — ABNORMAL LOW (ref 1.15–1.40)
HCT: 17 % — ABNORMAL LOW (ref 36.0–46.0)
Hemoglobin: 5.8 g/dL — CL (ref 12.0–15.0)
O2 Saturation: 94 %
Patient temperature: 36.8
Potassium: 6.6 mmol/L (ref 3.5–5.1)
Sodium: 131 mmol/L — ABNORMAL LOW (ref 135–145)
TCO2: 12 mmol/L — ABNORMAL LOW (ref 22–32)
pCO2 arterial: 24.7 mmHg — ABNORMAL LOW (ref 32–48)
pH, Arterial: 7.284 — ABNORMAL LOW (ref 7.35–7.45)
pO2, Arterial: 79 mmHg — ABNORMAL LOW (ref 83–108)

## 2023-07-01 LAB — PROTIME-INR
INR: 3.3 — ABNORMAL HIGH (ref 0.8–1.2)
Prothrombin Time: 34 s — ABNORMAL HIGH (ref 11.4–15.2)

## 2023-07-01 LAB — FIBRINOGEN: Fibrinogen: 172 mg/dL — ABNORMAL LOW (ref 210–475)

## 2023-07-01 LAB — PREPARE CRYOPRECIPITATE: Unit division: 0

## 2023-07-01 LAB — SURGICAL PATHOLOGY

## 2023-07-01 LAB — APTT: aPTT: 60 s — ABNORMAL HIGH (ref 24–36)

## 2023-07-01 LAB — CG4 I-STAT (LACTIC ACID): Lactic Acid, Venous: >15 mmol/L (ref 0.5–1.9)

## 2023-07-01 LAB — GLUCOSE, CAPILLARY
Glucose-Capillary: 118 mg/dL — ABNORMAL HIGH (ref 70–99)
Glucose-Capillary: 136 mg/dL — ABNORMAL HIGH (ref 70–99)

## 2023-07-01 MED ORDER — ACETAMINOPHEN 160 MG/5ML PO SOLN: 650.0000 mg | Freq: Four times a day (QID) | ORAL | Status: DC | PRN

## 2023-07-01 MED ORDER — DEXTROSE 50 % IV SOLN
1.0000 | Freq: Once | INTRAVENOUS | Status: AC
  Administered 2023-07-01: 50 mL via INTRAVENOUS
  Filled 2023-07-01: qty 50

## 2023-07-01 MED ORDER — GLYCOPYRROLATE 1 MG PO TABS: 1.0000 mg | ORAL_TABLET | ORAL | Status: DC | PRN

## 2023-07-01 MED ORDER — SODIUM CHLORIDE 0.9% FLUSH: 3.0000 mL | INTRAVENOUS | Status: DC | PRN

## 2023-07-01 MED ORDER — INSULIN ASPART 100 UNIT/ML IV SOLN
10.0000 [IU] | Freq: Once | INTRAVENOUS | Status: AC
  Administered 2023-07-01: 10 [IU] via INTRAVENOUS

## 2023-07-01 MED ORDER — MORPHINE 100MG IN NS 100ML (1MG/ML) PREMIX INFUSION
0.0000 mg/h | INTRAVENOUS | Status: DC
Start: 2023-07-01 — End: 2023-07-01

## 2023-07-01 MED ORDER — SODIUM CHLORIDE 0.9 % IV SOLN
1.0000 g | Freq: Once | INTRAVENOUS | Status: AC
  Administered 2023-07-01: 1 g via INTRAVENOUS
  Filled 2023-07-01: qty 10

## 2023-07-01 MED ORDER — MORPHINE BOLUS VIA INFUSION: 5.0000 mg | INTRAVENOUS | Status: DC | PRN

## 2023-07-01 MED ORDER — GLYCOPYRROLATE 0.2 MG/ML IJ SOLN: 0.2000 mg | INTRAMUSCULAR | Status: DC | PRN

## 2023-07-01 MED ORDER — POLYVINYL ALCOHOL 1.4 % OP SOLN: 1.0000 [drp] | Freq: Four times a day (QID) | OPHTHALMIC | Status: DC | PRN

## 2023-07-01 MED ORDER — SODIUM CHLORIDE 0.9% IV SOLUTION: Freq: Once | INTRAVENOUS | Status: AC

## 2023-07-01 MED ORDER — SODIUM CHLORIDE 0.9% FLUSH: 3.0000 mL | Freq: Two times a day (BID) | INTRAVENOUS | Status: DC

## 2023-07-01 MED ORDER — ACETAMINOPHEN 650 MG RE SUPP: 650.0000 mg | Freq: Four times a day (QID) | RECTAL | Status: DC | PRN

## 2023-07-01 MED FILL — Potassium Chloride Inj 2 mEq/ML: INTRAVENOUS | Qty: 20 | Status: AC

## 2023-07-01 MED FILL — Lidocaine HCl Local Preservative Free (PF) Inj 2%: INTRAMUSCULAR | Qty: 14 | Status: AC

## 2023-07-01 MED FILL — Thrombin (Recombinant) For Soln 20000 Unit: CUTANEOUS | Qty: 1 | Status: AC

## 2023-07-01 MED FILL — Mannitol IV Soln 20%: INTRAVENOUS | Qty: 1000 | Status: AC

## 2023-07-01 MED FILL — Lidocaine HCl Local Soln Prefilled Syringe 100 MG/5ML (2%): INTRAMUSCULAR | Qty: 5 | Status: AC

## 2023-07-01 MED FILL — Sodium Bicarbonate IV Soln 8.4%: INTRAVENOUS | Qty: 50 | Status: AC

## 2023-07-01 MED FILL — Electrolyte-R (PH 7.4) Solution: INTRAVENOUS | Qty: 6000 | Status: AC

## 2023-07-01 MED FILL — Calcium Chloride Inj 10%: INTRAVENOUS | Qty: 10 | Status: AC

## 2023-07-01 MED FILL — Heparin Sodium (Porcine) Inj 1000 Unit/ML: INTRAMUSCULAR | Qty: 60 | Status: AC

## 2023-07-01 MED FILL — Sodium Chloride IV Soln 0.9%: INTRAVENOUS | Qty: 2000 | Status: AC

## 2023-07-01 MED FILL — Albumin, Human Inj 5%: INTRAVENOUS | Qty: 250 | Status: AC

## 2023-07-02 LAB — TYPE AND SCREEN
ABO/RH(D): O POS
Antibody Screen: NEGATIVE
Unit division: 0
Unit division: 0
Unit division: 0
Unit division: 0
Unit division: 0
Unit division: 0
Unit division: 0
Unit division: 0
Unit division: 0
Unit division: 0
Unit division: 0
Unit division: 0

## 2023-07-02 LAB — BPAM RBC
Blood Product Expiration Date: 202506042359
Blood Product Expiration Date: 202506072359
Blood Product Expiration Date: 202506042359
Blood Product Expiration Date: 202506052359
Blood Product Expiration Date: 202506082359
Blood Product Expiration Date: 202506082359
Blood Product Expiration Date: 202506082359
Blood Product Expiration Date: 202506082359
Blood Product Unit Number: 202506082359
Blood Product Unit Number: 202506072359
ISSUE DATE / TIME: 202505111813
ISSUE DATE / TIME: 202505120011
ISSUE DATE / TIME: 202505091037
ISSUE DATE / TIME: 202505091037
ISSUE DATE / TIME: 202505091424
ISSUE DATE / TIME: 202505091424
ISSUE DATE / TIME: 202505091603
ISSUE DATE / TIME: 202505111644
PRODUCT CODE: 202505121542
PRODUCT CODE: 202506042359
PRODUCT CODE: 202506082359
PRODUCT CODE: 202505120308
PRODUCT CODE: 202506082359
PRODUCT CODE: 202506082359
PRODUCT CODE: 202506082359
PRODUCT CODE: 202506082359
Unit Type and Rh: 5100
Unit Type and Rh: 202505080735
Unit Type and Rh: 5100
Unit Type and Rh: 5100
Unit Type and Rh: 202505122214
Unit Type and Rh: 202506072359
Unit Type and Rh: 202506082359
Unit Type and Rh: 202506082359
Unit Type and Rh: 5100
Unit Type and Rh: 5100
Unit Type and Rh: 5100
Unit Type and Rh: 5100
Unit Type and Rh: 5100
Unit Type and Rh: 5100
Unit Type and Rh: 5100
Unit Type and Rh: 5100
Unit Type and Rh: 5100
Unit Type and Rh: 5100
Unit Type and Rh: 5100

## 2023-07-05 LAB — CULTURE, BLOOD (ROUTINE X 2)
Culture: NO GROWTH
Culture: NO GROWTH
Special Requests: ADEQUATE

## 2023-07-10 ENCOUNTER — Ambulatory Visit

## 2023-07-21 NOTE — Progress Notes (Addendum)
  Patient asystole at 0408.  Auscultated heart and lung sounds for 2 minutes, no audible sounds noted. Second RN verified: Leila Dassan, RN. Pt pronounced at 0408 on 2023/07/13.

## 2023-07-21 NOTE — Progress Notes (Signed)
Patient extubated to comfort care per MD order.

## 2023-07-21 NOTE — Progress Notes (Addendum)
 PCCM interim progress note:  Pt has had escalating pressor requirements overnight despite additional unit PRBC's, 2 FFP, albumin , no longer pulling on CRRT and off all sedation. She received methylene blue and stress dose steroids earlier.  Currently on Levophed  , vaso 0.04, epi 20mcg, milrinone  0.375 and Dobutamine 10mcg along with bicarb infusion.  Last Hgb 7.8, lactic stabilized at 14 and her acidosis has improved.  She now has bilateral blown pupils, unresponsive off all sedation without gag, corneals or respiratory drive with decreased RR.  I have explained the situation to her sister who is on the way in;  I expect pt will not survive the night.     3:54 AM Pt's sister at the bedside and requests that patient be transitioned to comfort care, she is the healthcare POA and agree that pt continues to decline despite maximal efforts and further efforts are futile.    Karla Boozer Geovanie Winnett, PA-C

## 2023-07-21 DEATH — deceased

## 2023-08-20 NOTE — Discharge Summary (Signed)
 Physician Discharge Summary  Patient ID: Karla Foster MRN: [id number] DOB/AGE: [birth year] 68 y.o.  Admit date: 06/24/2023 Discharge date: 07-10-2023 Expired  Admission Diagnoses: Acute systolic congestive heart failure Moderate to severe aortic insufficiency Severe mitral regurgitation Atrial fibrillation with rapid ventricular response  Discharge Diagnoses:  Principal Problem:   Atrial fibrillation with RVR (HCC) Active Problems:   S/P aortic valve replacement with bioprosthetic valve   Septic shock (HCC)   Other acquired hemolytic anemias [facility])    Hospital Course:   The patient presented as an outpatient visit and was found to be in atrial fibrillation with rapid ventricular response with a heart rate into the 170s with hypotension into the 80 and 90 systolic range.  She was sent to the emergency room and started on intravenous amiodarone  and heparin .  She continued to be hypotensive.  A 2D echocardiogram showed moderate to severe aortic insufficiency and severe mitral regurgitation.  There is also moderate tricuspid regurgitation.  Left ventricular ejection fraction was decreased to 30 to 35% with global hypokinesis.  There is moderate right ventricular systolic dysfunction.  There was severe pulm hypertension.  She underwent cardiac catheterization on 06/25/2023 showing normal coronary arteries with an elevated pulmonary capillary wedge pressure and low cardiac index of 1.9.  She was seen by me in consultation on 06/26/2023.  She had undergone a TEE guided cardioversion prior to me seeing her.  She was taken the operating room on 06/28/2023 and underwent aortic valve replacement with a 21 mm Edwards  INSPIRIS RESILIA pericardial valve, mitral valve replacement using a 27 mm Medtronic Mosaic porcine valve and biatrial Maze procedure with clipping of left atrial appendage.  She was weaned from cardiopulmonary bypass on milrinone  and epinephrine .  She was initially hemodynamically stable  and was extubated the morning following surgery.  Throughout the day she continued to require increasing vasopressor doses and later that evening he deteriorated requiring norepinephrine  up to 35 mics and epinephrine  at 20 mics.  A bedside echo showed good left ventricular function with a dilated RV and no signs of tamponade.  She had pulmonary hypertension and elevated CVP of 25.  She was seen by CCM and started on CRRT urgently for volume removal since she was oliguric.  She was reintubated for respiratory failure.  She developed worsening lactic acidosis and hemodynamics were felt to be consistent with distributive shock.  There was concern about mesenteric ischemia given the degree of acidosis that could not be corrected with CRRT.  She developed consumptive thrombocytopenia and coagulopathy.  Despite all efforts she expired on 07/10/2023 at 0408.  Consults: cardiology, pulmonary/intensive care, and nephrology  Significant Diagnostic Studies:   ECHOCARDIOGRAM REPORT       Patient Name:   Karla Foster Date of Exam: 06/24/2023  Medical Rec #:  [id number]               Height:       67.0 in  Accession #:    [id number]              Weight:       166.0 lb  Date of Birth:  Sep 24, 1955               BSA:          1.868 m  Patient Age:    68 years                BP:           106/96  mmHg  Patient Gender: F                       HR:           140 bpm.  Exam Location:  Inpatient   Procedure: 2D Echo, Color Doppler and Cardiac Doppler (Both Spectral and  Color            Flow Doppler were utilized during procedure).   STAT ECHO                           REPORT CONTAINS CRITICAL RESULT     Severe MR, reduced EF, moderate-severe AR, severely elevated PASP  reported to                                    Dr. Alveta.   Indications:    Mitral Regurgitation i34.0    History:        Patient has prior history of Echocardiogram examinations,  most                 recent 06/14/2011. COPD; Risk  Factors:Hypertension and                  Dyslipidemia.    Sonographer:    Damien Senior RDCS  Referring Phys: [id number] SHENG L HALEY   IMPRESSIONS     1. Left ventricular ejection fraction, by estimation, is 30 to 35%. The  left ventricle has moderately decreased function. The left ventricle  demonstrates global hypokinesis. Left ventricular diastolic function could  not be evaluated.   2. Right ventricular systolic function is moderately reduced. The right  ventricular size is normal. There is severely elevated pulmonary artery  systolic pressure. The estimated right ventricular systolic pressure is  65.1 mmHg.   3. Left atrial size was moderately dilated.   4. Right atrial size was mildly dilated.   5. A small pericardial effusion is present.   6. The mitral valve is abnormal. Severe mitral valve regurgitation.   7. Tricuspid valve regurgitation is moderate.   8. The aortic valve was not well visualized. There is mild calcification  of the aortic valve. There is mild thickening of the aortic valve. Aortic  valve regurgitation is moderate to severe.   9. The inferior vena cava is dilated in size with <50% respiratory  variability, suggesting right atrial pressure of 15 mmHg.   Comparison(s): Prior images unable to be directly viewed, comparison made  by report only. Changes from prior study are noted.   Conclusion(s)/Recommendation(s): Reduced LVEF, severe MR, moderate-severe  AR, severely elevated PA pressures are new compared to prior report.  Findings communicated to Dr. Alveta.   FINDINGS   Left Ventricle: Left ventricular ejection fraction, by estimation, is 30  to 35%. The left ventricle has moderately decreased function. The left  ventricle demonstrates global hypokinesis. The left ventricular internal  cavity size was normal in size.  There is no left ventricular hypertrophy. Left ventricular diastolic  function could not be evaluated due to atrial fibrillation. Left   ventricular diastolic function could not be evaluated.   Right Ventricle: The right ventricular size is normal. Right vetricular  wall thickness was not well visualized. Right ventricular systolic  function is moderately reduced. There is severely elevated pulmonary  artery systolic pressure. The tricuspid  regurgitant velocity is 3.54 m/s, and  with an assumed right atrial  pressure of 15 mmHg, the estimated right ventricular systolic pressure is  65.1 mmHg.   Left Atrium: Left atrial size was moderately dilated.   Right Atrium: Right atrial size was mildly dilated.   Pericardium: A small pericardial effusion is present.   Mitral Valve: The mitral valve is abnormal. There is mild thickening of  the mitral valve leaflet(s). There is mild calcification of the mitral  valve leaflet(s). Severe mitral valve regurgitation. MV peak gradient,  28.6 mmHg. The mean mitral valve  gradient is 16.0 mmHg.   Tricuspid Valve: The tricuspid valve is grossly normal. Tricuspid valve  regurgitation is moderate . No evidence of tricuspid stenosis.   Aortic Valve: The aortic valve was not well visualized. There is mild  calcification of the aortic valve. There is mild thickening of the aortic  valve. Aortic valve regurgitation is moderate to severe.   Pulmonic Valve: The pulmonic valve was grossly normal. Pulmonic valve  regurgitation is mild. No evidence of pulmonic stenosis.   Aorta: The aortic root, ascending aorta and aortic arch are all  structurally normal, with no evidence of dilitation or obstruction.   Venous: The inferior vena cava is dilated in size with less than 50%  respiratory variability, suggesting right atrial pressure of 15 mmHg.   IAS/Shunts: The atrial septum is grossly normal.     LEFT VENTRICLE  PLAX 2D  LVIDd:         4.20 cm  LVIDs:         3.50 cm  LV PW:         0.80 cm  LV IVS:        0.70 cm  LVOT diam:     2.00 cm  LV SV:         44  LV SV Index:   24  LVOT  Area:     3.14 cm     RIGHT VENTRICLE  RV S prime:     9.25 cm/s  TAPSE (M-mode): 1.3 cm   LEFT ATRIUM              Index        RIGHT ATRIUM           Index  LA diam:        4.30 cm  2.30 cm/m   RA Area:     16.60 cm  LA Vol (A2C):   113.0 ml 60.48 ml/m  RA Volume:   45.80 ml  24.51 ml/m  LA Vol (A4C):   66.4 ml  35.54 ml/m  LA Biplane Vol: 87.1 ml  46.62 ml/m   AORTIC VALVE  LVOT Vmax:   113.50 cm/s  LVOT Vmean:  82.550 cm/s  LVOT VTI:    0.142 m    AORTA  Ao Root diam: 2.60 cm  Ao Asc diam:  3.20 cm   MITRAL VALVE                  TRICUSPID VALVE  MV Area VTI:  1.05 cm        TR Peak grad:   50.1 mmHg  MV Peak grad: 28.6 mmHg       TR Vmax:        354.00 cm/s  MV Mean grad: 16.0 mmHg  MV Vmax:      2.68 m/s        SHUNTS  MV Vmean:     190.5 cm/s  Systemic VTI:  0.14 m  MR Peak grad:    99.8 mmHg    Systemic Diam: 2.00 cm  MR Mean grad:    75.5 mmHg  MR Vmax:         499.50 cm/s  MR Vmean:        423.5 cm/s  MR PISA:         5.09 cm  MR PISA Eff ROA: 39 mm  MR PISA Radius:  0.90 cm   Shelda Bruckner MD  Electronically signed by Shelda Bruckner MD  Signature Date/Time: 06/24/2023/3:19:17 PM        Final     Physicians  Panel Physicians Referring Physician Case Authorizing Physician  Rolan Ezra RAMAN, MD (Primary)     Procedures  RIGHT/LEFT HEART CATH AND CORONARY ANGIOGRAPHY   Conclusion  1. Normal coronaries, nonischemic cardiomyopathy.  2. Elevated PCWP though LVEDP was not significantly elevated (discrepancy may be related to mitral regurgitation).  3. Borderline elevated right-sided filling pressures.  4. Low CI at 1.9 5. Mild pulmonary venous hypertension.    Continue diuresis, will hold off on milrinone  for now with plan for DCCV tomorrow.     TRANSESOPHOGEAL ECHO REPORT       Patient Name:   Karla Foster Date of Exam: 06/26/2023  Medical Rec #:  [id number]               Height:       67.0 in  Accession #:     [id number]              Weight:       160.7 lb  Date of Birth:  [birth year]               BSA:          1.843 m  Patient Age:    68 years                BP:           88/73 mmHg  Patient Gender: F                       HR:           109 bpm.  Exam Location:  Inpatient   Procedure: 3D Echo, Transesophageal Echo, Cardiac Doppler and Color  Doppler            (Both Spectral and Color Flow Doppler were utilized during             procedure).   Indications:     I48.91* Unspeicified atrial fibrillation; I34.0  Nonrheumatic                  mitral (valve) insufficiency; I34.2 Nonrheumatic mitral  (valve)                  stenosis    History:         Patient has prior history of Echocardiogram examinations,  most                  recent 06/24/2023. Abnormal ECG, COPD, Mitral Valve Disease  and                  Aortic Valve Disease, Arrythmias:Atrial Fibrillation,                   Signs/Symptoms:Dizziness/Lightheadedness; Risk  Factors:Dyslipidemia and Current Smoker.    Sonographer:     Ellouise Mose RDCS  Referring Phys:  [id number] SHENG L HALEY  Diagnosing Phys: Ezra McleanMD   PROCEDURE: After discussion of the risks and benefits of a TEE, an  informed consent was obtained from the patient. The transesophogeal probe  was passed without difficulty through the esophogus of the patient. Imaged  were obtained with the patient in a  left lateral decubitus position. Sedation performed by different  physician. The patient was monitored while under deep sedation.  Anesthestetic sedation was provided intravenously by Anesthesiology: 240mg   of Propofol , 100mg  of Lidocaine . The patient's  vital signs; including heart rate, blood pressure, and oxygen  saturation;  remained stable throughout the procedure. Supplementary images were  obtained from transthoracic windows as indicated to answer the clinical  question. The patient developed no  complications during the procedure. A  successful direct current  cardioversion was performed at 200 joules with 1 attempt.    IMPRESSIONS     1. Left ventricular ejection fraction, by estimation, is 35% when in  atrial fibrillation with RVR. After DCCV, the LV EF improved and appeared  to be around 50%. The left ventricle has moderately decreased function.  The left ventricle demonstrates global   hypokinesis. The left ventricular internal cavity size was mildly  dilated.   2. Right ventricular systolic function is mildly reduced. The right  ventricular size is mildly enlarged. There is moderately elevated  pulmonary artery systolic pressure. The estimated right ventricular  systolic pressure is 45.2 mmHg.   3. There was smoke but no definite formed thrombus in the LA appendage.  Left atrial size was moderately dilated.   4. Right atrial size was mildly dilated.   5. No PFO/ASD by color doppler.   6. The mitral valve was thickened towards the tips and  rheumatic-appearing. There was incomplete coaptation. Severe mitral  regurgitation with probably mild mitral stenosis (mean gradient 7 mmHg in  setting of severe MR). PISA ERO 0.77 cm^2, vena  contracta area 0.6 cm^2. There was systolic flow reversal in the pulmonary  vein doppler signal.   7. The tricuspid valve is abnormal. Tricuspid valve regurgitation is  moderate.   8. The aortic valve leaflet tips were thickened with incomplete  coaptation. The aortic valve is tricuspid. Aortic valve regurgitation is  moderate to severe. No aortic stenosis is present.   9. 3D performed of the mitral valve and 3D performed of the LAA. Aortic  regurgitation vena contracta 0.44 cm. There was no holodiastolic flow  reversal on doppler interrogation in the descending thoracic aorta.  10. The inferior vena cava is normal in size with <50% respiratory  variability, suggesting right atrial pressure of 8 mmHg.  11. A small pericardial effusion is present.   FINDINGS   Left Ventricle: LV  function improved post cardioversion. Left ventricular  ejection fraction, by estimation, is 35%. The left ventricle has  moderately decreased function. The left ventricle demonstrates global  hypokinesis. The left ventricular internal  cavity size was mildly dilated. There is no left ventricular hypertrophy.   Right Ventricle: The right ventricular size is mildly enlarged. No  increase in right ventricular wall thickness. Right ventricular systolic  function is mildly reduced. There is moderately elevated pulmonary artery  systolic pressure. The tricuspid  regurgitant velocity is 3.05 m/s, and with an assumed right atrial  pressure of 8 mmHg, the estimated right ventricular systolic pressure is  45.2 mmHg.   Left Atrium: There  was smoke but no definite formed thrombus in the LA  appendage. Left atrial size was moderately dilated.   Right Atrium: Right atrial size was mildly dilated.   Pericardium: A small pericardial effusion is present.   Mitral Valve: The mitral valve was thickened towards the tips and  rheumatic-appearing. There was incomplete coaptation. Severe mitral  regurgitation with probably mild mitral stenosis (mean gradient 7 mmHg in  setting of severe MR). PISA ERO 0.77 cm^2,  vena contracta area 0.6 cm^2. There was systolic flow reversal in the  pulmonary vein doppler signal. The mitral valve is rheumatic. Severe  mitral valve regurgitation. Mild to moderate mitral valve stenosis. MV  peak gradient, 13.4 mmHg. The mean mitral  valve gradient is 7.0 mmHg.   Tricuspid Valve: The tricuspid valve is abnormal. Tricuspid valve  regurgitation is moderate.   Aortic Valve: The aortic valve leaflet tips were thickened with incomplete  coaptation. The aortic valve is tricuspid. Aortic valve regurgitation is  moderate to severe. No aortic stenosis is present.   Pulmonic Valve: The pulmonic valve was normal in structure. Pulmonic valve  regurgitation is trivial. No evidence  of pulmonic stenosis.   Aorta: The aortic root and ascending aorta are structurally normal, with  no evidence of dilitation.   Venous: The inferior vena cava is normal in size with less than 50%  respiratory variability, suggesting right atrial pressure of 8 mmHg.   IAS/Shunts: No PFO/ASD by color doppler.   Additional Comments: Spectral Doppler performed.   LEFT VENTRICLE  PLAX 2D  LVIDd:         5.80 cm  LV PW:         1.00 cm  LV IVS:        0.90 cm     IVC  IVC diam: 1.80 cm   MITRAL VALVE             TRICUSPID VALVE  MV Peak grad: 13.4 mmHg  TR Peak grad:   37.2 mmHg  MV Mean grad: 7.0 mmHg   TR Vmax:        305.00 cm/s  MV Vmax:      1.83 m/s  MV Vmean:     124.0 cm/s   Dalton McleanMD  Electronically signed by Ezra Kanner  Signature Date/Time: 06/26/2023/3:53:22 PM        Final      *INTRAOPERATIVE TRANSESOPHAGEAL REPORT *      Patient Name:   Karla Foster Date of Exam: 06/28/2023  Medical Rec #:  [id number]               Height:       67.0 in  Accession #:    [id number]              Weight:       166.0 lb  Date of Birth:  01/04/1956               BSA:          1.87 m  Patient Age:    68 years                BP:           85/46 mmHg  Patient Gender: F                       HR:           62 bpm.  Exam Location:  Anesthesiology  Transesophogeal exam was perform intraoperatively during surgical  procedure.  Patient was closely monitored under general anesthesia during the entirety  of  examination.   Indications:    Aortic Insufficiency i35.1                   Mitral Regurgitation i34.0  Sonographer:     Damien Senior RDCS  Performing Phys: [id number] DORISE MARLA FELLERS  Diagnosing Phys: Juliene Clinton MD   PROCEDURE: Intraoperative Transesophogeal 3D imaging performed to better  assess valves.  Complications: No known complications during this procedure.  POST-OP IMPRESSIONS  _ Aortic Valve: A bioprosthetic valve was placed, leaflets are freely   mobile and  thin. Manufactured by; Inspiris Size; 21mm. The gradient recorded across  the  prosthetic valve is within the expected range.Mean PG 5 mmHg across the  new  bioprosthetic valve.  _ Mitral Valve: A bioprosthetic valve was placed, leaflets are freely  mobile and  thin. Manufactured by; Medtronic Size; 27 mm .Mean PG 4 mmHg across the  new  prosthetic valve.   PRE-OP FINDINGS   Left Ventricle: The left ventricle has mild-moderately reduced systolic  function, with an ejection fraction of 40-45%. The cavity size was mildly  dilated. There is no increase in left ventricular wall thickness. Left  ventrical global hypokinesis without   regional wall motion abnormalities. There is no left ventricular  hypertrophy.    Right Ventricle: The right ventricle has normal systolic function. The  cavity was normal. There is no increase in right ventricular wall  thickness.   Left Atrium: Left atrial size was dilated. No left atrial/left atrial  appendage thrombus was detected.   Right Atrium: Right atrial size was dilated.   Interatrial Septum: No atrial level shunt detected by color flow Doppler.   Pericardium: There is no evidence of pericardial effusion.   Mitral Valve: The mitral valve is rheumatic. Mitral valve regurgitation is  severe by color flow Doppler. There is No evidence of mitral stenosis.  Both the anterior and posterior leaflets are thickened, shortened, and  have moderately restricted mobility.   Tricuspid Valve: The tricuspid valve was normal in structure. Tricuspid  valve regurgitation is moderate by color flow Doppler.   Aortic Valve: The aortic valve is tricuspid Aortic valve regurgitation is  severe by color flow Doppler. There is no stenosis of the aortic valve.    Pulmonic Valve: The pulmonic valve was normal in structure.  Pulmonic valve regurgitation is not visualized by color flow Doppler.    Aorta: The aortic root, ascending aorta and aortic  arch are normal in size  and structure.     Juliene Clinton MD  Electronically signed by Juliene Clinton MD  Signature Date/Time: 06/28/2023/4:00:01 PM        Final       ECHOCARDIOGRAM REPORT       Patient Name:   Karla Foster Date of Exam: 06/30/2023  Medical Rec #:  [id number]               Height:       67.0 in  Accession #:    [id number]              Weight:       199.3 lb  Date of Birth:  05-25-1955               BSA:          2.019 m  Patient Age:    74 years  BP:           99/46 mmHg  Patient Gender: F                       HR:           91 bpm.  Exam Location:  Inpatient   Procedure: 2D Echo, Color Doppler and Cardiac Doppler (Both Spectral and  Color            Flow Doppler were utilized during procedure).   Indications:    CHF-Acute Systolic I50.21    History:        Patient has prior history of Echocardiogram examinations,  most                 recent 06/26/2023.                  Aortic Valve: 21 mm Edwards Inspiris Resilia valve is  present in                  the aortic position.                  Mitral Valve: 27 mm Medtronic Mosaic valve is present in  the                 mitral position.    Sonographer:    Tinnie Gosling RDCS  Referring Phys: [phone] DALTON S MCLEAN   IMPRESSIONS     1. Left ventricular ejection fraction, by estimation, is 55 to 60%. The  left ventricle has normal function. The left ventricle has no regional  wall motion abnormalities. There is mild left ventricular hypertrophy.  Left ventricular diastolic parameters  are indeterminate.   2. RV is not well visualized but best seen on image 26 and appears Right  ventricular systolic function is moderately reduced. The right ventricular  size is mildly enlarged. Tricuspid regurgitation signal is inadequate for  assessing PA pressure.   3. The mitral valve has been repaired/replaced. Trivial mitral valve  regurgitation. The mean mitral valve gradient is 9.0 mmHg with  average  heart rate of 91 bpm. There is a 27 mm Medtronic Mosaic present in the  mitral position. Vmax 2.6 m/s, MG ,  PHT , EOA 0.5cm^2. Elevated gradients with very low EOA, could  represent obstruction vs PPM   4. The aortic valve has been repaired/replaced. Aortic valve  regurgitation is not visualized. There is a 21 mm Edwards Inspiris Resilia  valve present in the aortic position. Vmax 2.1 m/s, MG , EOA 1.3  cm^2.    FINDINGS   Left Ventricle: Left ventricular ejection fraction, by estimation, is 55  to 60%. The left ventricle has normal function. The left ventricle has no  regional wall motion abnormalities. The left ventricular internal cavity  size was normal in size. There is   mild left ventricular hypertrophy. Left ventricular diastolic parameters  are indeterminate.   Right Ventricle: The right ventricular size is mildly enlarged. Right  vetricular wall thickness was not well visualized. Right ventricular  systolic function is moderately reduced. Tricuspid regurgitation signal is  inadequate for assessing PA pressure.   Left Atrium: Left atrial size was normal in size.   Right Atrium: Right atrial size was not well visualized.   Pericardium: There is no evidence of pericardial effusion. Presence of  epicardial fat layer.   Mitral Valve: The mitral valve has been repaired/replaced. Trivial mitral  valve regurgitation. There is  a 27 mm Medtronic Mosaic present in the  mitral position. The mean mitral valve gradient is 9.0 mmHg with average  heart rate of 91 bpm.   Tricuspid Valve: The tricuspid valve is normal in structure. Tricuspid  valve regurgitation is mild.   Aortic Valve: The aortic valve has been repaired/replaced. Aortic valve  regurgitation is not visualized. There is a 21 mm Edwards Inspiris Resilia  valve present in the aortic position.   Pulmonic Valve: The pulmonic valve was not well visualized. Pulmonic valve  regurgitation is not  visualized.   Aorta: The aortic root and ascending aorta are structurally normal, with  no evidence of dilitation.   IAS/Shunts: The interatrial septum was not well visualized.     LEFT VENTRICLE  PLAX 2D  LVIDd:         5.10 cm  LVIDs:         3.90 cm  LV PW:         1.10 cm  LV IVS:        1.10 cm  LVOT diam:     1.60 cm  LV SV:         32  LV SV Index:   16  LVOT Area:     2.01 cm     IVC  IVC diam: 2.00 cm   LEFT ATRIUM           Index  LA diam:      4.70 cm 2.33 cm/m  LA Vol (A4C): 66.9 ml 33.13 ml/m   AORTIC VALVE  LVOT Vmax:   130.00 cm/s  LVOT Vmean:  105.000 cm/s  LVOT VTI:    0.157 m    AORTA  Ao Root diam: 2.90 cm  Ao Asc diam:  3.00 cm   MITRAL VALVE  MV Mean grad: 9.0 mmHg SHUNTS                         Systemic VTI:  0.16 m                         Systemic Diam: 1.60 cm   Lonni Nanas MD  Electronically signed by Lonni Nanas MD  Signature Date/Time: 06/30/2023/1:55:39 PM        Final    Treatments: Procedures  Median Sternotomy Extracorporeal circulation 3.   Aortic valve replacement using a 21 mm Edwards INSPIRIS RESILIA pericardial   valve. 4.   Mitral valve replacement using a 27 mm Medtronic Mosaic porcine valve. 5.   Biatrial MAZE procedure using radiofrequency and cryotherapy. 6.   Clipping of left atrial appendage.    Discharge Exam: Blood pressure (!) 100/47, pulse (S) (!) 0, temperature 97.9 F (36.6 C), resp. rate (!) 0, height 5' 7 (1.702 m), weight 90.4 kg, SpO2 (!) 30%. expired      Discharge Medications:  Allergies as of 07-09-23       Reactions   Amoxicillin Other (See Comments)   Tongue swelling        Medication List    You have not been prescribed any medications.     Follow Up Appointments: Expired  Signed: Dorise MARLA Fellers 08/14/2023, 8:57 AM
